# Patient Record
Sex: Male | Born: 1945
Health system: Southern US, Community
[De-identification: ages and names within clinical notes are randomized; demographics above are authoritative.]

## PROBLEM LIST (undated history)

## (undated) DIAGNOSIS — R001 Bradycardia, unspecified: Secondary | ICD-10-CM

## (undated) DIAGNOSIS — Z87442 Personal history of urinary calculi: Secondary | ICD-10-CM

## (undated) DIAGNOSIS — I82409 Acute embolism and thrombosis of unspecified deep veins of unspecified lower extremity: Secondary | ICD-10-CM

## (undated) DIAGNOSIS — E291 Testicular hypofunction: Secondary | ICD-10-CM

## (undated) DIAGNOSIS — K635 Polyp of colon: Secondary | ICD-10-CM

## (undated) DIAGNOSIS — K219 Gastro-esophageal reflux disease without esophagitis: Secondary | ICD-10-CM

## (undated) DIAGNOSIS — E785 Hyperlipidemia, unspecified: Secondary | ICD-10-CM

## (undated) DIAGNOSIS — M1990 Unspecified osteoarthritis, unspecified site: Secondary | ICD-10-CM

## (undated) DIAGNOSIS — K649 Unspecified hemorrhoids: Secondary | ICD-10-CM

## (undated) DIAGNOSIS — J189 Pneumonia, unspecified organism: Secondary | ICD-10-CM

## (undated) DIAGNOSIS — I2699 Other pulmonary embolism without acute cor pulmonale: Secondary | ICD-10-CM

## (undated) DIAGNOSIS — I10 Essential (primary) hypertension: Secondary | ICD-10-CM

## (undated) HISTORY — DX: Gastro-esophageal reflux disease without esophagitis: K21.9

## (undated) HISTORY — DX: Hyperlipidemia, unspecified: E78.5

## (undated) HISTORY — DX: Other pulmonary embolism without acute cor pulmonale: I26.99

## (undated) HISTORY — DX: Polyp of colon: K63.5

## (undated) HISTORY — DX: Testicular hypofunction: E29.1

## (undated) HISTORY — DX: Pneumonia, unspecified organism: J18.9

## (undated) HISTORY — DX: Bradycardia, unspecified: R00.1

## (undated) HISTORY — DX: Unspecified hemorrhoids: K64.9

## (undated) HISTORY — PX: HAND SURGERY: SHX662

## (undated) HISTORY — PX: APPENDECTOMY: SHX54

---

## 1998-12-10 ENCOUNTER — Emergency Department (HOSPITAL_COMMUNITY): Admission: EM | Admit: 1998-12-10 | Discharge: 1998-12-10 | Payer: Self-pay | Admitting: Emergency Medicine

## 2003-11-06 ENCOUNTER — Emergency Department (HOSPITAL_COMMUNITY): Admission: AD | Admit: 2003-11-06 | Discharge: 2003-11-06 | Payer: Self-pay | Admitting: Family Medicine

## 2003-11-10 ENCOUNTER — Inpatient Hospital Stay (HOSPITAL_COMMUNITY): Admission: EM | Admit: 2003-11-10 | Discharge: 2003-11-14 | Payer: Self-pay | Admitting: Emergency Medicine

## 2005-09-06 ENCOUNTER — Ambulatory Visit: Payer: Self-pay | Admitting: Gastroenterology

## 2005-09-17 ENCOUNTER — Encounter (INDEPENDENT_AMBULATORY_CARE_PROVIDER_SITE_OTHER): Payer: Self-pay | Admitting: Specialist

## 2005-09-17 ENCOUNTER — Ambulatory Visit: Payer: Self-pay | Admitting: Gastroenterology

## 2009-08-25 ENCOUNTER — Encounter (INDEPENDENT_AMBULATORY_CARE_PROVIDER_SITE_OTHER): Payer: Self-pay | Admitting: *Deleted

## 2010-02-16 ENCOUNTER — Encounter (INDEPENDENT_AMBULATORY_CARE_PROVIDER_SITE_OTHER): Payer: Self-pay | Admitting: *Deleted

## 2010-03-22 LAB — FECAL OCCULT BLOOD, GUAIAC: Fecal Occult Blood: NEGATIVE

## 2010-03-23 ENCOUNTER — Encounter (INDEPENDENT_AMBULATORY_CARE_PROVIDER_SITE_OTHER): Payer: Self-pay

## 2010-03-27 ENCOUNTER — Ambulatory Visit: Payer: Self-pay | Admitting: Internal Medicine

## 2010-04-10 ENCOUNTER — Ambulatory Visit: Payer: Self-pay | Admitting: Internal Medicine

## 2010-08-19 ENCOUNTER — Encounter: Payer: Self-pay | Admitting: Family Medicine

## 2010-08-28 NOTE — Miscellaneous (Signed)
Summary: Lec previsit  Clinical Lists Changes  Medications: Added new medication of MOVIPREP 100 GM  SOLR (PEG-KCL-NACL-NASULF-NA ASC-C) As per prep instructions. - Signed Rx of MOVIPREP 100 GM  SOLR (PEG-KCL-NACL-NASULF-NA ASC-C) As per prep instructions.;  #1 x 0;  Signed;  Entered by: Ulis Rias RN;  Authorized by: Iva Boop MD, [facility];  Method used: Electronically to [facility] [phone]*, [address], [place], [address], [place]  [address], Ph: [phone] or [phone], Fax: [phone] Observations: Added new observation of NKA: T (03/27/2010 13:54)    Prescriptions: MOVIPREP 100 GM  SOLR (PEG-KCL-NACL-NASULF-NA ASC-C) As per prep instructions.  #1 x 0   Entered by:   Ulis Rias RN   Authorized by:   Iva Boop MD, [facility]   Signed by:   Ulis Rias RN on 03/27/2010   Method used:   Electronically to        [facility] [phone]* (retail)       [address] [place], [place]  [address]       Ph: [phone] or [phone]       Fax: [phone]   RxID:   [id number]

## 2010-08-28 NOTE — Letter (Signed)
Summary: Colonoscopy Letter  [facility]  [address] [place], [place] [address]   Phone: [phone]  Fax: [phone]      August 25, 2009 MRN: [id number]   [name] [name] [address] [place], [place]  [address]   Dear Mr. Weigelt,   According to your medical record, it is time for you to schedule a Colonoscopy. The American Cancer Society recommends this procedure as a method to detect early colon cancer. Patients with a family history of colon cancer, or a personal history of colon polyps or inflammatory bowel disease are at increased risk.  This letter has beeen generated based on the recommendations made at the time of your procedure. If you feel that in your particular situation this may no longer apply, please contact our office.  Please call our office at [phone] to schedule this appointment or to update your records at your earliest convenience.  Thank you for cooperating with [place] to provide you with the very best care possible.   Sincerely,  Barbette Hair. Arlyce Dice, M.D.  [facility] Gastroenterology Division [phone]

## 2010-08-28 NOTE — Procedures (Signed)
Summary: Colonoscopy  Patient: [name] [name] Note: All result statuses are Final unless otherwise noted.  Tests: (1) Colonoscopy (COL)   COL Colonoscopy           DONE     [facility]     [address]     [place], [place]  [address]           COLONOSCOPY PROCEDURE REPORT           PATIENT:  [name], [name]  MR#:  [id number]     BIRTHDATE:  02-Apr-1946, 63 yrs. old  GENDER:  male     ENDOSCOPIST:  Iva Boop, MD, [facility]           PROCEDURE DATE:  04/10/2010     PROCEDURE:  Colonoscopy [address]56     ASA CLASS:  Class II     INDICATIONS:  surveillance and high-risk screening diminutive     adenomas removed 2002 and 2007 (1 each time)     MEDICATIONS:   Fentanyl 50 mcg IV, Versed 5 mg IV           DESCRIPTION OF PROCEDURE:   After the risks benefits and     alternatives of the procedure were thoroughly explained, informed     consent was obtained.  Digital rectal exam was performed and     revealed no abnormalities and normal prostate.   The LB CF-[id number]     [id number] endoscope was introduced through the anus and advanced to     the cecum, which was identified by both the appendix and ileocecal     valve, without limitations.  The quality of the prep was     excellent, using MoviPrep.  The instrument was then slowly     withdrawn as the colon was fully examined. Insertion: 2:33 minutes     Withdrawal: 7:48 minutes     <<PROCEDUREIMAGES>>           FINDINGS:  A normal appearing cecum, ileocecal valve, and     appendiceal orifice were identified. The ascending, hepatic     flexure, transverse, splenic flexure, descending, sigmoid colon,     and rectum appeared unremarkable.   Retroflexed views in the right     colon and  rectum revealed hypertrophied anal papillae.    The     scope was then withdrawn from the patient and the procedure     completed.           COMPLICATIONS:  None     ENDOSCOPIC IMPRESSION:     1) Normal colon with excellent prep     2) Hypertrophied anal  papillae in the anorectum     3) Personal history of diminutive adenoma removal (1 each) in 2002     and 2007           REPEAT EXAM:  In 5 year(s) for routine screening colonoscopy.           Iva Boop, MD, [facility]           CC:  Rudi Heap, MD     The Patient           n.     Rosalie Doctor:   Iva Boop at 04/10/2010 10:59 AM           Romilda Joy, [phone]  Note: An exclamation mark (!) indicates a result that was not dispersed into the flowsheet. Document Creation Date: 04/10/2010 11:00 AM _______________________________________________________________________  (1) Order result  status: Final Collection or observation date-time: 04/10/2010 10:47 Requested date-time:  Receipt date-time:  Reported date-time:  Referring Physician:   Ordering Physician: Stan Head [phone]) Specimen Source:  Source: Launa Grill Order Number: [id number] Lab site:   Appended Document: Colonoscopy    Clinical Lists Changes  Observations: Added new observation of COLONNXTDUE: 03/2015 (04/10/2010 14:25)

## 2010-08-28 NOTE — Letter (Signed)
Summary: Previsit letter  [facility]  [address] [place], [place] [address]   Phone: [phone]  Fax: [phone]       02/16/2010 MRN: [id number]  [name] [name] [address] [place], [place]  [address]  Dear [name],  Welcome to the Gastroenterology Division at [facility].    You are scheduled to see a nurse for your pre-procedure visit on 03-27-10 at 2:00p.m. on the 3rd floor at [facility], [address]  We ask that you try to arrive at our office 15 minutes prior to your appointment time to allow for check-in.  Your nurse visit will consist of discussing your medical and surgical history, your immediate family medical history, and your medications.    Please bring a complete list of all your medications or, if you prefer, bring the medication bottles and we will list them.  We will need to be aware of both prescribed and over the counter drugs.  We will need to know exact dosage information as well.  If you are on blood thinners (Coumadin, Plavix, Aggrenox, Ticlid, etc.) please call our office today/prior to your appointment, as we need to consult with your physician about holding your medication.   Please be prepared to read and sign documents such as consent forms, a financial agreement, and acknowledgement forms.  If necessary, and with your consent, a friend or relative is welcome to sit-in on the nurse visit with you.  Please bring your insurance card so that we may make a copy of it.  If your insurance requires a referral to see a specialist, please bring your referral form from your primary care physician.  No co-pay is required for this nurse visit.     If you cannot keep your appointment, please call [phone] to cancel or reschedule prior to your appointment date.  This allows [place] the opportunity to schedule an appointment for another patient in need of care.    Thank you for choosing [facility] for your medical needs.   We appreciate the opportunity to care for you.  Please visit [place] at our website  to learn more about our practice.                     Sincerely.                                                                                                                   The Gastroenterology Division

## 2010-08-28 NOTE — Letter (Signed)
Summary: [facility] Instructions  [facility]  [address] [place], [place] [address]   Phone: [phone]  Fax: [phone]       INMER NIX    Sep 30, 1945    MRN: [id number]        Procedure Day /Date:  Tuesday 04/10/2010     Arrival Time: 9:00 am      Procedure Time: 10:00 am     Location of Procedure:                    _x _  [facility] (4th Floor)                        PREPARATION FOR COLONOSCOPY WITH MOVIPREP   Starting 5 days prior to your procedure Thursday 9/8 do not eat nuts, seeds, popcorn, corn, beans, peas,  salads, or any raw vegetables.  Do not take any fiber supplements (e.g. Metamucil, Citrucel, and Benefiber).  THE DAY BEFORE YOUR PROCEDURE         DATE: Monday 9/12 1.  Drink clear liquids the entire day-NO SOLID FOOD  2.  Do not drink anything colored red or purple.  Avoid juices with pulp.  No orange juice.  3.  Drink at least 64 oz. (8 glasses) of fluid/clear liquids during the day to prevent dehydration and help the prep work efficiently.  CLEAR LIQUIDS INCLUDE: Water Jello Ice Popsicles Tea (sugar ok, no milk/cream) Powdered fruit flavored drinks Coffee (sugar ok, no milk/cream) Gatorade Juice: apple, white grape, white cranberry  Lemonade Clear bullion, consomm, broth Carbonated beverages (any kind) Strained chicken noodle soup Hard Candy                             4.  In the morning, mix first dose of MoviPrep solution:    Empty 1 Pouch A and 1 Pouch B into the disposable container    Add lukewarm drinking water to the top line of the container. Mix to dissolve    Refrigerate (mixed solution should be used within 24 hrs)  5.  Begin drinking the prep at 5:00 p.m. The MoviPrep container is divided by 4 marks.   Every 15 minutes drink the solution down to the next mark (approximately 8 oz) until the full liter is complete.   6.  Follow completed prep with 16 oz of clear liquid of your choice (Nothing  red or purple).  Continue to drink clear liquids until bedtime.  7.  Before going to bed, mix second dose of MoviPrep solution:    Empty 1 Pouch A and 1 Pouch B into the disposable container    Add lukewarm drinking water to the top line of the container. Mix to dissolve    Refrigerate  THE DAY OF YOUR PROCEDURE      DATE: Tuesday 9/13  Beginning at 5:00 a.m. (5 hours before procedure):         1. Every 15 minutes, drink the solution down to the next mark (approx 8 oz) until the full liter is complete.  2. Follow completed prep with 16 oz. of clear liquid of your choice.    3. You may drink clear liquids until 8:00 am (2 HOURS BEFORE PROCEDURE).   MEDICATION INSTRUCTIONS  Unless otherwise instructed, you should take regular prescription medications with a small sip of water   as early as possible the morning of your  procedure.          OTHER INSTRUCTIONS  You will need a responsible adult at least 67 years of age to accompany you and drive you home.   This person must remain in the waiting room during your procedure.  Wear loose fitting clothing that is easily removed.  Leave jewelry and other valuables at home.  However, you may wish to bring a book to read or  an iPod/MP3 player to listen to music as you wait for your procedure to start.  Remove all body piercing jewelry and leave at home.  Total time from sign-in until discharge is approximately 2-3 hours.  You should go home directly after your procedure and rest.  You can resume normal activities the  day after your procedure.  The day of your procedure you should not:   Drive   Make legal decisions   Operate machinery   Drink alcohol   Return to work  You will receive specific instructions about eating, activities and medications before you leave.    The above instructions have been reviewed and explained to me by   Ulis Rias RN  March 27, 2010 2:27 PM     I fully understand and can  verbalize these instructions _____________________________ Date _________

## 2010-11-16 ENCOUNTER — Encounter: Payer: Self-pay | Admitting: Family Medicine

## 2010-11-16 DIAGNOSIS — I10 Essential (primary) hypertension: Secondary | ICD-10-CM | POA: Insufficient documentation

## 2010-11-16 DIAGNOSIS — R001 Bradycardia, unspecified: Secondary | ICD-10-CM | POA: Insufficient documentation

## 2010-11-16 DIAGNOSIS — K635 Polyp of colon: Secondary | ICD-10-CM

## 2010-11-16 DIAGNOSIS — N400 Enlarged prostate without lower urinary tract symptoms: Secondary | ICD-10-CM | POA: Insufficient documentation

## 2010-11-16 DIAGNOSIS — E785 Hyperlipidemia, unspecified: Secondary | ICD-10-CM

## 2010-11-16 DIAGNOSIS — K649 Unspecified hemorrhoids: Secondary | ICD-10-CM | POA: Insufficient documentation

## 2010-11-16 DIAGNOSIS — E349 Endocrine disorder, unspecified: Secondary | ICD-10-CM | POA: Insufficient documentation

## 2010-11-16 DIAGNOSIS — E291 Testicular hypofunction: Secondary | ICD-10-CM

## 2010-11-16 DIAGNOSIS — E1159 Type 2 diabetes mellitus with other circulatory complications: Secondary | ICD-10-CM | POA: Insufficient documentation

## 2010-11-16 DIAGNOSIS — K219 Gastro-esophageal reflux disease without esophagitis: Secondary | ICD-10-CM | POA: Insufficient documentation

## 2010-12-14 NOTE — Discharge Summary (Signed)
[name], [name]                         ACCOUNT NO.:  [id number]   MEDICAL RECORD NO.:  [id number]                   PATIENT TYPE:  INP   LOCATION:  5705                                 FACILITY:  [facility]   PHYSICIAN:  Dionne Ano. Amanda Pea, M.D.             DATE OF BIRTH:  April 16, 1946   DATE OF ADMISSION:  11/10/2003  DATE OF DISCHARGE:  11/14/2003                                 DISCHARGE SUMMARY   ADMISSION DIAGNOSES:  1. Right hand, mid palmar, abscess with ascending lymphangitis.  2. History of hypertension.  3. History of asthma.   DISCHARGE DIAGNOSES:  1. Right hand, mid palmar, abscess with ascending lymphangitis, improved.  2. History of hypertension.  3. History of asthma.   SURGEON:  Dionne Ano. Amanda Pea, M.D.   PROCEDURE:  1. Incision and drainage of the mid palmar space with fasciotomy of the     right hand, right open carpal tunnel release and tenosynovectomy     ____________ with sheath incision and drainage.  2. Repeat incision and drainage of the mid palmar space with I&D reflects     her sheath at wrist, palm and forearm as well as closure.  Consults:     None.   BRIEF HISTORY:  [name] [name] is a very pleasant 65 year old gentleman who  presented to the [facility] Emergency Room on November 10, 2003 for progressive  swelling and pain and tenderness, status post an injury he sustained while  working in a chicken coop from a dirty stick.  Unfortunately, the patient  had a stabbing injury through the mid palm space, approximately four days to  his ER visit.  He was seen in cholinergic care and placed on pain  medications and washed out.  He was started on antibiotics on Monday and  was seen subsequently by ___________ and Dr. Jacqulyn Bath in [place] and referred to  [facility] for evaluation due to increased pain, tenderness and swelling.  Hand surgeon, Dr. Dominica Severin, saw and evaluated the patient.  He was  found to have a mid palmar space infection with ascending  lymphangitis and  cellulitis.  Due to the severity of upper extremity predicament, decision  was made to proceed to the OR for incision and drainage fasciotomy and  carpal tunnel release as necessary.  Preoperatively, the patient's  laboratory data showed his chest x-ray showed mild enlargement of the  cardiac silhouette; otherwise within normal limits.  He had normal sinus  rhythm on his EKG.  Hemoglobin and hematocrit within normal limits.   HOSPITAL COURSE:  [name] [name] was admitted on November 10, 2003 and underwent  the above procedures without complications.  He tolerated this quite well  with expectations of a repeat incision and drainage within 24-48 hours.  On  November 10, 2003, postoperative day #1, he was doing well.  He was started on  Unasyn prophylactic antibiotic regimen as well as standard pain medication.  He  was doing quite well.  He, of course, had pain and tenderness but he was  afebrile.  His vital signs were stable.  His vascular exam was within normal  limits and showed no signs of compartment syndrome, status post his  fasciotomy and open carpal tunnel release.  Sensation was intact of all the  fingers.  Elevation and IV antibiotics would be continued throughout the  following day and postoperative day #2, repeat incision and drainage to the  mid palmar space of the right hand was performed, but not in the flexor  sheath of the wrist, palm and forearm as well as closure.  He tolerated the  second procedure well.  There were no complications.  Please see operative  report for details.  Throughout the next few days, his continually improved.  Intraoperative cultures were obtained and showed rare E coli and  subsequently, after his discharge, some final cultures showed rare  Enterobacter cloacae.  No anaerobes were present.  Wound care was continued  throughout his hospital course as well as IV antibiotics, and on November 14, 2003, he was doing quite well.  He was without  complaints.  His vital signs  were stable.  He was afebrile.  His wound was much improved.  His range of  motion had continually improved with therapy's help.  There was less  swelling and overall he progressed nicely.  Due to his improved state,  decision was made to discharge him home.   FINAL DIAGNOSIS:  Mid palmar space infection/deep abscess and ascending  lymphangitis, status post incision and drainage x2 with fasciotomy and  carpal tunnel release of the right upper extremity.   CONDITION ON DISCHARGE:  Improved.   DIET:  Regular diet.   ACTIVITY:  He will keep his dressing clean, dry and intact.  He will move  his fingers frequently and elevate them as well as massage.   DISCHARGE MEDICATIONS:  1. Cipro 750 mg one p.o. b.i.d.  2. Percocet #15 one to two p.o. q.4-6h. p.r.n. pain.  3. Robaxin #30 one p.o. q.6-8h. p.r.n. spasm.   WOUND CARE:  He will keep his upper extremities clean, dry and intact.   FOLLOW UP:  Return to see Dr. Amanda Pea in the office setting Thursday.  He  will see the therapist tomorrow for wound care as explicitly detailed to  them.      Karie Chimera, P.A.-C.                   Dionne Ano. Amanda Pea, M.D.    BB/MEDQ  D:  01/09/2004  T:  01/10/2004  Job:  [id number]

## 2010-12-14 NOTE — Op Note (Signed)
[name] [name], [name] [name]                         ACCOUNT NO.:  [id number]   MEDICAL RECORD NO.:  [id number]                   PATIENT TYPE:  INP   LOCATION:  5705                                 FACILITY:  [facility]   PHYSICIAN:  Dionne Ano. Everlene Other, M.D.         DATE OF BIRTH:  10-31-1945   DATE OF PROCEDURE:  11/12/2003  DATE OF DISCHARGE:                                 OPERATIVE REPORT   PREOPERATIVE DIAGNOSES:  Status post incision and drainage of right  midpalmar space, flexor tendon sheath apparatus and open carpal tunnel  release secondary to severe infection, right hand.   POSTOPERATIVE DIAGNOSES:  Status post incision and drainage of right  midpalmar space, flexor tendon sheath apparatus and open carpal tunnel  release secondary to severe infection, right hand.   OPERATION PERFORMED:  1. Incision and drainage, midpalmar space with loose closure.  2. Incision and drainage, carpal canal and wrist forearm flexor sheath     system with loose closure over drain.  3. Incision and drainage, flexor sheath system in the mid palm region.   SURGEON:  Dionne Ano. Amanda Pea, M.D.   ASSISTANT:  None.   ANESTHESIA:  General.   COMPLICATIONS:  None.   DRAINS:  Three.   TOURNIQUET TIME:  0.   ESTIMATED BLOOD LOSS:  Minimal.   INDICATIONS FOR PROCEDURE:  The patient is a 65 year old male who has  previously undergone incision and drainage.  He is here for second look and  washout.  He has responded reasonably well but still continues to have  swelling and signs of infection, of course.  There is no ascending  cellulitis or lymphangitis.  His forearm has been soft proximally and  without tenderness along the fascial planes.  His elbow motion has been  maintained without abnormality.  His cultures currently pending.  He has  been on antibiotics prior to his incision and drainage.  Thus this may be  skewed a bit.  He understands the risks and benefits of surgery.   DESCRIPTION OF  PROCEDURE:  The patient was seen by myself and anesthesia,  was taken to the operative suite, and underwent a smooth induction of  general anesthesia.  He was laid supine, appropriately padded, prepped and  draped in the usual sterile fashion about the right upper extremity with  Betadine scrub and paint.  Once this was accomplished, the patient had the  wounds reopened.  I performed cultures, aerobic and anaerobic at this time.  Following this, I then performed incision and drainage of the mid palmar  space including the deep midpalmar space below the flexor sheath and tendon  region.  This was irrigated copiously with warm saline.  Following this, the  flexor tendon sheath of the middle, ring and small fingers were separately  identified and irrigated.  I took care to preserve the neurovascular bundles  to my satisfaction.  Following this, I turned attention towards the carpal  tunnel and forearm wound.  I performed incision and drainage and irrigation  of the area.  There was no significant reaccumulation of purulence and I was  pleased with this.  I then performed further irrigation with greater than 2  L of fluid.  Following this, a Penrose drain was placed times two about the  midpalmar space and times one about the wrist region.  I then closed both  areas loosely with Prolene suture and left no nerves or tendons exposed.  The patient tolerated the procedure well without difficulty.  There were no  complicating features with this surgery.  Following this, he was extubated  after sterile dressing and volar splint were applied, and taken to recovery  room.  Mission sling, IV antibiotics, OT consult for range of motion and  general postoperative measures will be adhered to.  I have discussed with  him do's and don't's etc and all questions have been encouraged and  answered.                                               Dionne Ano. Everlene Other, M.D.    Nash Mantis  D:  11/12/2003  T:   11/14/2003  Job:  [id number]

## 2010-12-14 NOTE — Op Note (Signed)
[name] [name], [name] [name]                         ACCOUNT NO.:  [id number]   MEDICAL RECORD NO.:  [id number]                   PATIENT TYPE:  INP   LOCATION:  5705                                 FACILITY:  [facility]   PHYSICIAN:  Dionne Ano. Everlene Other, M.D.         DATE OF BIRTH:  07-09-1946   DATE OF PROCEDURE:  11/10/2003  DATE OF DISCHARGE:                                 OPERATIVE REPORT   PREOPERATIVE DIAGNOSIS:  Right hand purulent flexor tenosynovitis and  associated midpalmar space infection with tracking of the infection into the  carpal tunnel region along the bursal sheath system.   POSTOPERATIVE DIAGNOSIS:  Right hand purulent flexor tenosynovitis and  associated midpalmar space infection with tracking of the infection into the  carpal tunnel region along the bursal sheath system.   OPERATION PERFORMED:  1. Incision and drainage midpalmar space, right hand.  2. Fasciotomy of right hand and forearm.  3. Open carpal tunnel release.  4. Tenosynovectomy flexor digitorum superficialis and flexor digitorum     profundus at the palm and wrist forearm level.   SPECIMENS:  Cultures times three taken.   DRAINS:  Multiple placed.   SURGEON:  Dionne Ano. Amanda Pea, M.D.   ASSISTANT:  None.   ANESTHESIA:  General.   COMPLICATIONS:  None.   TOURNIQUET TIME:  Less than an hour.   ESTIMATED BLOOD LOSS:  Minimal.   INDICATIONS FOR PROCEDURE:  The patient is a 65 year old male who sustained  a puncture wound with a small stick in his midpalm, right hand four days  ago.  Unfortunately, the stick had remnants of chicken feces on it.  He was  seen and initially incised and drained the wound and closed.  This initial  presentation was at the [facility] urgent care.  He was subsequently seen by Dr.  Bevelyn Buckles and Dr. Jacqulyn Bath  in [place] and referred to [facility]  after worsening.  He was seen today by my partner, Dr. Leonides Grills, who  referred him to my for upper extremity specialty  care.  He presented to the  emergency department where I saw and evaluated him at length.  The patient  had findings as noted above and I felt it was imperative to bring him to the  operating room as soon as possible as this infection was worsening quickly.  The patient understood risks and benefits of loss of limb, bleeding,  infection, anesthesia, damage to normal structures and failure of surgery to  accomplish its intended goals of relieving symptoms and restoring function.  With this in mind, he desires to proceed.  I have discussed all issues  preoperatively with him and his wife.   DESCRIPTION OF PROCEDURE:  The patient was seen by myself and anesthesia, he  was taken to the operative suite and underwent a smooth induction of general  anesthesia.  He was then prepped and draped in the usual sterile fashion  about the right  upper extremity after he was padded appropriately.  Once  this was done, a tourniquet was insufflated to 250 mmHg and incision was  made about the proximal palmar crease, oblique in nature.  Dissection was  carried down through the palmar fascia which was incised, allowing  identification of the superficial palmar arch and the common and digital  arteries and nerves and the proper digital arteries and nerves.  The patient  had fluid egress from the flexor sheaths of the small, ring and middle  fingers.  I cultured this immediately and sent this off.  Following this, I  performed a debridement of the FDS and FDP tendons about the middle, ring  and small fingers.  Following this, I entered into the midpalmar space where  purulent fluid was encountered as well.  A fasciotomy was accomplished about  the interosseous muscles in this region to my satisfaction without  difficulty and a second culture was taken in this region of the purulent  fluid.  The midpalmar space was thoroughly incised and drained to my  satisfaction without difficulty.  This was done separately  about each  interosseous region taking care to preserve the digital artery and nerves as  well as the flexor tendons.  I carefully placed retractors between the  spaces to make sure he was completely released. There was no obvious distal  tracking; however, the patient did have fluid tracking proximally into the  carpal canal. This was a known phenomenon where the sheath system will track  infection into the forearm via the carpal canal and sheath system.   With this noted, I then made an extended carpal tunnel incision into the  forearm, dissected down and released the transverse carpal ligament after  the palmar fascia was released.  This released the median nerve which was  under tight pressure.  Fluid egressed which was similar in nature to the  fluid that I had previously cultured.  I then performed a fasciotomy about  the volar forearm.  I identified the muscle and made sure this was not  necrotic.  This was noted to be fresh, healthy muscle.  There was no  necrosis of the muscle.  I then carefully performed a tenosynovectomy of the  FDP and FDS tendons removing fluid from the region.  This was done to my  satisfaction without difficulty.  Following this, I deflated the tourniquet,  irrigated copiously with greater than 2 L of fluid and once this was done,  placed five Penrose drains in the wound to allow for egress of fluid.  The  patient tolerated the procedure well.  Following this, sterile dressing was  applied.  Excellent refill was noted.  Compartments were soft and there were  no complicating features.  A sterile dressing of Xeroform was placed over  the wounds which were left open except for one Prolene stitch in the  extended carpal tunnel release site. This will allow for the egress of  fluid.  Sterile dressing was applied and a volar plaster splint placed.  Following this, he was extubated and taken to recovery room in stable condition where excellent refill was noted and  he was in stable condition.  The patient will be started immediately on IV Unasyn.  He will be monitored  closely.  We will plan for elevation, antibiotics, close observation and  second look in 24 to 36 hours to check his progress.  We will of course  await cultures as well.  All questions have been  encouraged and answered.                                               Dionne Ano. Everlene Other, M.D.    Nash Mantis  D:  11/10/2003  T:  11/11/2003  Job:  [id number]

## 2012-12-02 ENCOUNTER — Other Ambulatory Visit: Payer: Self-pay | Admitting: Family Medicine

## 2012-12-03 NOTE — Telephone Encounter (Signed)
LAST LABS 1/14

## 2012-12-14 ENCOUNTER — Ambulatory Visit (INDEPENDENT_AMBULATORY_CARE_PROVIDER_SITE_OTHER): Payer: No Typology Code available for payment source | Admitting: Family Medicine

## 2012-12-14 ENCOUNTER — Encounter: Payer: Self-pay | Admitting: Family Medicine

## 2012-12-14 VITALS — BP 129/79 | HR 53 | Temp 97.0°F | Ht 65.25 in | Wt 252.8 lb

## 2012-12-14 DIAGNOSIS — M25569 Pain in unspecified knee: Secondary | ICD-10-CM

## 2012-12-14 DIAGNOSIS — E785 Hyperlipidemia, unspecified: Secondary | ICD-10-CM

## 2012-12-14 DIAGNOSIS — E291 Testicular hypofunction: Secondary | ICD-10-CM

## 2012-12-14 DIAGNOSIS — R5381 Other malaise: Secondary | ICD-10-CM

## 2012-12-14 DIAGNOSIS — R5383 Other fatigue: Secondary | ICD-10-CM

## 2012-12-14 DIAGNOSIS — E559 Vitamin D deficiency, unspecified: Secondary | ICD-10-CM

## 2012-12-14 DIAGNOSIS — I10 Essential (primary) hypertension: Secondary | ICD-10-CM | POA: Insufficient documentation

## 2012-12-14 DIAGNOSIS — K219 Gastro-esophageal reflux disease without esophagitis: Secondary | ICD-10-CM

## 2012-12-14 DIAGNOSIS — J309 Allergic rhinitis, unspecified: Secondary | ICD-10-CM

## 2012-12-14 DIAGNOSIS — M25559 Pain in unspecified hip: Secondary | ICD-10-CM

## 2012-12-14 DIAGNOSIS — N400 Enlarged prostate without lower urinary tract symptoms: Secondary | ICD-10-CM

## 2012-12-14 LAB — HEPATIC FUNCTION PANEL: ALT: 19 U/L (ref 0–53)

## 2012-12-14 LAB — POCT CBC
Lymph, poc: 2 (ref 0.6–3.4)
MCH, POC: 30.8 pg (ref 27–31.2)
MCHC: 33.1 g/dL (ref 31.8–35.4)
MCV: 93.1 fL (ref 80–97)
POC Granulocyte: 3.8 (ref 2–6.9)
POC LYMPH PERCENT: 33.9 %L (ref 10–50)
RBC: 5.1 M/uL (ref 4.69–6.13)

## 2012-12-14 LAB — BASIC METABOLIC PANEL WITH GFR
CO2: 30 mEq/L (ref 19–32)
Chloride: 101 mEq/L (ref 96–112)
Creat: 0.89 mg/dL (ref 0.50–1.35)
GFR, Est Non African American: 88 mL/min

## 2012-12-14 LAB — PSA: PSA: 2.31 ng/mL (ref <=4.00)

## 2012-12-14 NOTE — Patient Instructions (Addendum)
Fall precautions discussed Continue current meds and therapeutic lifestyle changes, lose weight!!!!!!!! Try Advil one 3 times daily after meals or Aleve 1 twice daily after meals, taking this be sure and take the omeprazole twice daily before meals Please check on the availability of the shingles shot, zostavax. Try Nasacort AQ over-the-counter 1-2 sprays each nostril daily. Use this regularly and it should help your nasal congestion.

## 2012-12-14 NOTE — Progress Notes (Signed)
Subjective:    Patient ID: [name] [name], male    DOB: 05/05/1946, 67 y.o.   MRN: [id number]  HPI This patient presents for recheck of multiple medical problems. His wife accompanies the patient today.  Patient Active Problem List   Diagnosis Date Noted  . Testosterone deficiency   . Hyperlipidemia   . Sinus bradycardia   . GERD (gastroesophageal reflux disease)   . Colon polyp history   . Hemorrhoid   . BPH     In addition, see review of systems. All blood pressures run anywhere from 108-140 over the 70s. Energy level is about the same no worse no better. Sexual function is stable.   The allergies, current medications, past medical history, surgical history, family and social history are reviewed.  Immunizations reviewed.  Health maintenance reviewed.  The following items are outstanding: FOBT and zostavax      Review of Systems  Constitutional: Positive for fatigue (slight).  HENT: Negative.   Eyes: Negative.   Respiratory: Negative.   Cardiovascular: Negative.   Gastrointestinal: Positive for constipation.  Endocrine: Negative.   Genitourinary: Negative.   Musculoskeletal: Positive for back pain (LBP), joint swelling (knees) and arthralgias (bilateral arms, knees).  Skin: Negative.   Allergic/Immunologic: Positive for environmental allergies (seasonal).  Neurological: Negative.   Psychiatric/Behavioral: Negative.        Objective:   Physical Exam BP 129/79  Pulse 53  Temp(Src) 97 F (36.1 C) (Oral)  Ht 5' 5.25" (1.657 m)  Wt 252 lb 12.8 oz (114.669 kg)  BMI 41.76 kg/m2  The patient appeared well nourished and normally developed, alert and oriented to time and place. Speech, behavior and judgement appear normal. Vital signs as documented.  Head exam is unremarkable. No scleral icterus or pallor noted. He does have bilateral nasal congestion. Throat was normal . Neck is without jugular venous distension, thyromegally, or carotid bruits. Carotid  upstrokes are brisk bilaterally. No cervical adenopathy. Lungs are clear anteriorly and posteriorly to auscultation. Normal respiratory effort. Cardiac exam reveals regular rate and rhythm at 60 per minute. First and second heart sounds normal.  No murmurs, rubs or gallops.  Abdominal exam reveals normal bowl sounds, no masses, no organomegaly and no aortic enlargement. No inguinal adenopathy. There was abdominal obesity. There is no inguinal hernia. The prostate was minimally enlarged. There were no lumps in the prostate or in the rectum. Extremities are nonedematous and both femoral and pedal pulses are normal. Skin without pallor or jaundice.  Warm and dry, without rash. He has thick nail tinea on his toes. Neurologic exam reveals normal deep tendon reflexes and normal sensation.          Assessment & Plan:  1. GERD (gastroesophageal reflux disease) - Hepatic function panel; Standing - Hepatic function panel  2. Hyperlipidemia - Hepatic function panel; Standing - BASIC METABOLIC PANEL WITH GFR; Standing - NMR Lipoprofile with Lipids; Standing - Hepatic function panel - BASIC METABOLIC PANEL WITH GFR - NMR Lipoprofile with Lipids  3. Testosterone deficiency - Testosterone, Total & Free Direct - PSA  4. Vitamin D deficiency - Vitamin D 25 hydroxy; Standing - Vitamin D 25 hydroxy  5. Fatigue - POCT CBC; Standing - POCT CBC  6. BPH (benign prostatic hyperplasia) - PSA  7. Chronic arthralgias of knees and hips, unspecified laterality  8. Allergic rhinitis We'll try Nasacort AQ over-the-counter   Patient Instructions  Fall precautions discussed Continue current meds and therapeutic lifestyle changes, lose weight!!!!!!!! Try Advil one 3 times  daily after meals or Aleve 1 twice daily after meals, taking this be sure and take the omeprazole twice daily before meals Please check on the availability of the shingles shot, zostavax. Try Nasacort AQ over-the-counter 1-2  sprays each nostril daily. Use this regularly and it should help your nasal congestion.

## 2012-12-15 LAB — VITAMIN D 25 HYDROXY (VIT D DEFICIENCY, FRACTURES): Vit D, 25-Hydroxy: 62 ng/mL (ref 30–89)

## 2012-12-16 LAB — TESTOSTERONE, TOTAL AND FREE DIRECT MEASURE: Testosterone: 250 ng/dL — ABNORMAL LOW (ref 300–890)

## 2012-12-18 LAB — NMR LIPOPROFILE WITH LIPIDS
Cholesterol, Total: 113 mg/dL (ref <200)
HDL Particle Number: 31.5 umol/L (ref >=30.5)
HDL Size: 8.7 nm — ABNORMAL LOW (ref >=9.2)
HDL-C: 35 mg/dL — ABNORMAL LOW (ref >=40)
LDL Size: 20 nm — ABNORMAL LOW (ref >20.5)
LP-IR Score: 54 — ABNORMAL HIGH (ref <=45)
Large HDL-P: 2.4 umol/L — ABNORMAL LOW (ref >=4.8)
Small LDL Particle Number: 571 nmol/L — ABNORMAL HIGH (ref <=527)
Triglycerides: 110 mg/dL (ref <150)

## 2012-12-22 ENCOUNTER — Telehealth: Payer: Self-pay | Admitting: *Deleted

## 2012-12-22 NOTE — Telephone Encounter (Signed)
Pt notified of results

## 2012-12-22 NOTE — Telephone Encounter (Signed)
Message copied by Bearl Mulberry on Tue Dec 22, 2012 11:36 AM ------      Message from: Ernestina Penna      Created: Fri Dec 18, 2012  2:03 PM       Report to patient that PSA is stable and that where it was in the past. ------

## 2013-02-01 ENCOUNTER — Other Ambulatory Visit: Payer: Self-pay | Admitting: Family Medicine

## 2013-02-22 ENCOUNTER — Other Ambulatory Visit: Payer: Self-pay

## 2013-02-22 MED ORDER — TESTOSTERONE 50 MG/5GM (1%) TD GEL: 5.0000 g | Freq: Every day | TRANSDERMAL | Status: DC

## 2013-02-22 NOTE — Telephone Encounter (Signed)
Last seen 12/14/12  DWM   If approved phone in

## 2013-02-22 NOTE — Telephone Encounter (Signed)
Left message on voicemail.

## 2013-03-16 ENCOUNTER — Other Ambulatory Visit: Payer: Self-pay

## 2013-03-16 MED ORDER — TESTOSTERONE 50 MG/5GM (1%) TD GEL: 5.0000 g | Freq: Every day | TRANSDERMAL | Status: DC

## 2013-03-16 NOTE — Telephone Encounter (Signed)
Last seen 12/14/12  DWM   Last filled 02/22/13  If approved route to  Nurse and print

## 2013-05-03 ENCOUNTER — Telehealth: Payer: Self-pay | Admitting: Family Medicine

## 2013-05-03 ENCOUNTER — Ambulatory Visit (INDEPENDENT_AMBULATORY_CARE_PROVIDER_SITE_OTHER): Payer: No Typology Code available for payment source | Admitting: Family Medicine

## 2013-05-03 ENCOUNTER — Ambulatory Visit (INDEPENDENT_AMBULATORY_CARE_PROVIDER_SITE_OTHER): Payer: No Typology Code available for payment source

## 2013-05-03 ENCOUNTER — Encounter: Payer: Self-pay | Admitting: Family Medicine

## 2013-05-03 VITALS — BP 132/77 | HR 52 | Temp 98.3°F | Ht 65.25 in | Wt 248.0 lb

## 2013-05-03 DIAGNOSIS — Z23 Encounter for immunization: Secondary | ICD-10-CM

## 2013-05-03 DIAGNOSIS — I10 Essential (primary) hypertension: Secondary | ICD-10-CM

## 2013-05-03 DIAGNOSIS — E291 Testicular hypofunction: Secondary | ICD-10-CM

## 2013-05-03 DIAGNOSIS — E785 Hyperlipidemia, unspecified: Secondary | ICD-10-CM

## 2013-05-03 DIAGNOSIS — J309 Allergic rhinitis, unspecified: Secondary | ICD-10-CM

## 2013-05-03 DIAGNOSIS — N400 Enlarged prostate without lower urinary tract symptoms: Secondary | ICD-10-CM

## 2013-05-03 DIAGNOSIS — E559 Vitamin D deficiency, unspecified: Secondary | ICD-10-CM

## 2013-05-03 DIAGNOSIS — K219 Gastro-esophageal reflux disease without esophagitis: Secondary | ICD-10-CM

## 2013-05-03 LAB — POCT CBC
Granulocyte percent: 69.8 % (ref 37–80)
HCT, POC: 46 % (ref 43.5–53.7)
Hemoglobin: 15.6 g/dL (ref 14.1–18.1)
Lymph, poc: 2.1 (ref 0.6–3.4)
MCH, POC: 31.2 pg (ref 27–31.2)
MCHC: 33.9 g/dL (ref 31.8–35.4)
MCV: 92 fL (ref 80–97)
MPV: 7.5 fL (ref 0–99.8)
POC Granulocyte: 5.6 (ref 2–6.9)
POC LYMPH PERCENT: 26 % (ref 10–50)
Platelet Count, POC: 181 K/uL (ref 142–424)
RBC: 5 M/uL (ref 4.69–6.13)
RDW, POC: 12.2 %
WBC: 8 K/uL (ref 4.6–10.2)

## 2013-05-03 NOTE — Progress Notes (Signed)
Subjective:    Patient ID: [name] [name], male    DOB: March 17, 1946, 67 y.o.   MRN: [id number]  HPI Pt here for follow up and management of chronic medical problems. Patient comes to the visit with his wife. He is doing well other than bilateral knee pain. His review of systems was otherwise negative. He does complain of some constipation which is unchanged from the past. He does not want to get the flu shot. The importance of getting the shot was stressed to him     Patient Active Problem List   Diagnosis Date Noted  . Hypertension 12/14/2012  . Vitamin D deficiency 12/14/2012  . Testosterone deficiency   . Hyperlipidemia   . Sinus bradycardia   . GERD (gastroesophageal reflux disease)   . Colon polyp history   . BPH (benign prostatic hyperplasia)    Outpatient Encounter Prescriptions as of 05/03/2013  Medication Sig Dispense Refill  . amLODipine-benazepril (LOTREL) 5-10 MG per capsule TAKE ONE CAPSULE BY MOUTH AT BEDTIME  30 capsule  3  . aspirin 81 MG tablet Take 81 mg by mouth daily.        . Cholecalciferol (VITAMIN D-3) 5000 UNITS TABS Take 1 tablet by mouth daily.        . CRESTOR 10 MG tablet TAKE ONE TABLET BY MOUTH ONE TIME DAILY  30 tablet  3  . Garlic 100 MG TABS Take 2 tablets by mouth daily.       . NON FORMULARY Take 1 tablet by mouth daily. Equate allergy tablet      . Omega-3 Fatty Acids (FISH OIL) 1000 MG CAPS Take 4 capsules by mouth daily.        Marland Kitchen omeprazole (PRILOSEC) 20 MG capsule Take 20 mg by mouth daily.      . vitamin C (ASCORBIC ACID) 500 MG tablet Take 500 mg by mouth daily.      . vardenafil (LEVITRA) 20 MG tablet Take 20 mg by mouth daily as needed.        . [DISCONTINUED] testosterone (TESTIM) 50 MG/5GM GEL Place 5 g onto the skin daily.  5 g  2   No facility-administered encounter medications on file as of 05/03/2013.    Review of Systems  Constitutional: Negative.   HENT: Negative.   Eyes: Negative.   Respiratory: Negative.   Cardiovascular:  Negative.   Gastrointestinal: Negative.   Endocrine: Negative.   Genitourinary: Negative.   Musculoskeletal: Positive for arthralgias (bilateral knee pain).  Skin: Negative.   Allergic/Immunologic: Negative.   Neurological: Negative.   Hematological: Negative.   Psychiatric/Behavioral: Negative.        Objective:   Physical Exam  Nursing note and vitals reviewed. Constitutional: He is oriented to person, place, and time. He appears well-developed and well-nourished. No distress.  HENT:  Head: Normocephalic and atraumatic.  Right Ear: External ear normal.  Left Ear: External ear normal.  Mouth/Throat: Oropharynx is clear and moist. No oropharyngeal exudate.  Nasal congestion bilaterally. Patient was edentulous. Mouth was normal  Eyes: Conjunctivae and EOM are normal. Pupils are equal, round, and reactive to light. Right eye exhibits no discharge. Left eye exhibits no discharge. No scleral icterus.  Neck: Normal range of motion. Neck supple. No thyromegaly present.  Cardiovascular: Normal rate, regular rhythm, normal heart sounds and intact distal pulses.  Exam reveals no gallop and no friction rub.   No murmur heard. At 60 per minute  Pulmonary/Chest: Effort normal and breath sounds normal. No respiratory distress.  He has no wheezes. He has no rales. He exhibits no tenderness.  Dry cough  Abdominal: Soft. Bowel sounds are normal. He exhibits no mass. There is no tenderness. There is no rebound and no guarding.  Obesity  Musculoskeletal: Normal range of motion. He exhibits no edema and no tenderness.  Lymphadenopathy:    He has no cervical adenopathy.  Neurological: He is alert and oriented to person, place, and time. He has normal reflexes. No cranial nerve deficit.  Skin: Skin is warm and dry. No rash noted. No erythema. No pallor.  Psychiatric: He has a normal mood and affect. His behavior is normal. Judgment and thought content normal.   BP 132/77  Pulse 52  Temp(Src) 98.3  F (36.8 C) (Oral)  Ht 5' 5.25" (1.657 m)  Wt 248 lb (112.492 kg)  BMI 40.97 kg/m2   [facility] reading (PRIMARY) by  Dr. Christell Constant: Chest x-ray--  within normal limits                                    Assessment & Plan:   1. Testosterone deficiency   2. Hyperlipidemia   3. GERD (gastroesophageal reflux disease)   4. BPH (benign prostatic hyperplasia)   5. Hypertension   6. Vitamin D deficiency   7. Allergic rhinitis    Orders Placed This Encounter  Procedures  . DG Chest 2 View    Standing Status: Future     Number of Occurrences:      Standing Expiration Date: 07/03/2014    Order Specific Question:  Reason for Exam (SYMPTOM  OR DIAGNOSIS REQUIRED)    Answer:  htn    Order Specific Question:  Preferred imaging location?    Answer:  Internal  . Hepatic function panel  . BMP8+EGFR  . NMR, lipoprofile  . Vit D  25 hydroxy (rtn osteoporosis monitoring)  . POCT CBC   No orders of the defined types were placed in this encounter.   Patient Instructions  Continue current medications. Continue good therapeutic lifestyle changes which includes good diet and exercise. Fall precautions discussed with patient. Follow up as planned and earlier as needed.  Is highly recommended that you get a flu shot as well as the Prevnar. Nasacort over-the-counter 1-2 sprays each nostril at bedtime will help your allergic rhinitis Drink plenty of fluids, use MiraLax as needed for constipation Tdap Will be due next year Return FOBT   Nyra Capes MD

## 2013-05-03 NOTE — Addendum Note (Signed)
Addended by: Magdalene River on: 05/03/2013 09:34 AM   Modules accepted: Orders

## 2013-05-03 NOTE — Patient Instructions (Addendum)
Continue current medications. Continue good therapeutic lifestyle changes which includes good diet and exercise. Fall precautions discussed with patient. Follow up as planned and earlier as needed.  Is highly recommended that you get a flu shot as well as the Prevnar. Nasacort over-the-counter 1-2 sprays each nostril at bedtime will help your allergic rhinitis Drink plenty of fluids, use MiraLax as needed for constipation Tdap Will be due next year Return FOBT

## 2013-05-05 LAB — BMP8+EGFR
BUN/Creatinine Ratio: 18 (ref 10–22)
BUN: 20 mg/dL (ref 8–27)
CO2: 29 mmol/L (ref 18–29)
Calcium: 9.4 mg/dL (ref 8.6–10.2)
Chloride: 100 mmol/L (ref 97–108)
GFR calc Af Amer: 78 mL/min/{1.73_m2} (ref >59)

## 2013-05-05 LAB — HEPATIC FUNCTION PANEL: Total Bilirubin: 0.8 mg/dL (ref 0.0–1.2)

## 2013-05-05 LAB — NMR, LIPOPROFILE
HDL Cholesterol by NMR: 45 mg/dL (ref >=40)
HDL Particle Number: 34.2 umol/L (ref >=30.5)
LDL Particle Number: 1157 nmol/L — ABNORMAL HIGH (ref <1000)
LDL Size: 20 nm — ABNORMAL LOW (ref >20.5)
LDLC SERPL CALC-MCNC: 50 mg/dL (ref <100)
Triglycerides by NMR: 118 mg/dL (ref <150)

## 2013-05-05 LAB — VITAMIN D 25 HYDROXY (VIT D DEFICIENCY, FRACTURES): Vit D, 25-Hydroxy: 59 ng/mL (ref 30.0–100.0)

## 2013-05-06 ENCOUNTER — Ambulatory Visit (INDEPENDENT_AMBULATORY_CARE_PROVIDER_SITE_OTHER): Payer: No Typology Code available for payment source | Admitting: Family Medicine

## 2013-05-06 ENCOUNTER — Ambulatory Visit (INDEPENDENT_AMBULATORY_CARE_PROVIDER_SITE_OTHER): Payer: No Typology Code available for payment source

## 2013-05-06 VITALS — BP 138/77 | HR 62 | Temp 98.0°F | Wt 248.8 lb

## 2013-05-06 DIAGNOSIS — R109 Unspecified abdominal pain: Secondary | ICD-10-CM

## 2013-05-06 DIAGNOSIS — K5900 Constipation, unspecified: Secondary | ICD-10-CM

## 2013-05-06 MED ORDER — LACTULOSE 10 GM/15ML PO SOLN: ORAL | Status: DC

## 2013-05-06 MED ORDER — FLEET ENEMA 7-19 GM/118ML RE ENEM: 1.0000 | ENEMA | Freq: Every day | RECTAL | Status: DC | PRN

## 2013-05-06 NOTE — Progress Notes (Signed)
  Subjective:    Patient ID: [name] [name], male    DOB: [birth year], 67 y.o.   MRN: [id number]  HPI This 67 y.o. male presents for evaluation of constipation.  He has not had a BM in 4 days.   Review of Systems C/o constipation No chest pain, SOB, HA, dizziness, vision change, N/V, diarrhea, constipation, dysuria, urinary urgency or frequency, myalgias, arthralgias or rash.     Objective:   Physical Exam Vital signs noted  Well developed well nourished male.  HEENT - Head atraumatic Normocephalic                Eyes - PERRLA, Conjuctiva - clear Sclera- Clear EOMI                Ears - EAC's Wnl TM's Wnl Gross Hearing WNL                Throat - oropharanx wnl Respiratory - Lungs CTA bilateral Cardiac - RRR S1 and S2 without murmur GI - Abdomen soft Nontender and bowel sounds hypoactive x 4 Abdomen distended Extremities - No edema. Neuro - Grossly intact.       Assessment & Plan:  Abdominal pain, unspecified site - Plan: DG Abd 1 View, lactulose (CHRONULAC) 10 GM/15ML solution, sodium phosphate (FLEET) 7-19 GM/118ML ENEM  Unspecified constipation - Plan: sodium phosphate (FLEET) 7-19 GM/118ML ENEM  Deatra Canter FNP

## 2013-05-06 NOTE — Patient Instructions (Signed)

## 2013-05-10 ENCOUNTER — Ambulatory Visit (INDEPENDENT_AMBULATORY_CARE_PROVIDER_SITE_OTHER): Payer: No Typology Code available for payment source | Admitting: Family Medicine

## 2013-05-10 VITALS — BP 123/68 | HR 58 | Temp 98.0°F | Wt 245.0 lb

## 2013-05-10 DIAGNOSIS — R109 Unspecified abdominal pain: Secondary | ICD-10-CM

## 2013-05-10 DIAGNOSIS — R350 Frequency of micturition: Secondary | ICD-10-CM

## 2013-05-10 DIAGNOSIS — K5900 Constipation, unspecified: Secondary | ICD-10-CM

## 2013-05-10 MED ORDER — CIPROFLOXACIN HCL 500 MG PO TABS: 500.0000 mg | ORAL_TABLET | Freq: Two times a day (BID) | ORAL | Status: DC

## 2013-05-10 MED ORDER — LACTULOSE 10 GM/15ML PO SOLN: ORAL | Status: DC

## 2013-05-10 MED ORDER — TAMSULOSIN HCL 0.4 MG PO CAPS: 0.4000 mg | ORAL_CAPSULE | Freq: Every day | ORAL | Status: DC

## 2013-05-10 MED ORDER — LINACLOTIDE 145 MCG PO CAPS: 145.0000 ug | ORAL_CAPSULE | Freq: Every day | ORAL | Status: DC

## 2013-05-10 NOTE — Patient Instructions (Signed)

## 2013-05-10 NOTE — Progress Notes (Signed)
  Subjective:    Patient ID: [name] [name], male    DOB: 09/29/1945, 67 y.o.   MRN: [id number]  HPI This 67 y.o. male presents for evaluation of constipation.  He was seen 4 days ago for constipation and He did have good results for a couple days and then it quit working.  He has hx of chronic constipation And he has been having problems recently with constipation for 5 days and then he was seen and rx'd  chronulac syrup which he used once a day.  He has not used the fleets enema.  He has had recent Labs which were normal and KUB showed constipation.  He c/o of having to get up every hour to void for The last week.  He has hx of BPH but has not needed any medicine.  He denies fever   Review of Systems C/o constipation and urinary frequency No chest pain, SOB, HA, dizziness, vision change, N/V, diarrhea, myalgias, arthralgias or rash.     Objective:   Physical Exam Vital signs noted  Well developed well nourished male.  HEENT - Head atraumatic Normocephalic                Eyes - PERRLA, Conjuctiva - clear Sclera- Clear EOMI Respiratory - Lungs CTA bilateral Cardiac - RRR S1 and S2 without murmur GI - Abdomen soft Nontender. Distended,  and bowel sounds active x 4 Extremities - No edema. Neuro - Grossly intact.       Assessment & Plan:  Urinary frequency - Plan: tamsulosin (FLOMAX) 0.4 MG CAPS capsule, ciprofloxacin (CIPRO) 500 MG tablet bid x 2 weeks. Discussed with patient that the constipation could have caused prostatitis.  Take abx's for a couple weeks and then Continue flomax for another few weeks and then can try to see if nocturia resolves.  Unspecified constipation - Plan: Linaclotide (LINZESS) 145 MCG CAPS capsule  Abdominal pain, unspecified site - Plan: lactulose (CHRONULAC) 10 GM/15ML solution Advised to increase the chronulac to tid and then start linzess and then cut back on chronulac.  Deatra Canter FNP

## 2013-05-13 ENCOUNTER — Encounter: Payer: Self-pay | Admitting: *Deleted

## 2013-05-24 ENCOUNTER — Other Ambulatory Visit (INDEPENDENT_AMBULATORY_CARE_PROVIDER_SITE_OTHER): Payer: No Typology Code available for payment source

## 2013-05-24 DIAGNOSIS — Z1212 Encounter for screening for malignant neoplasm of rectum: Secondary | ICD-10-CM

## 2013-05-24 NOTE — Progress Notes (Signed)
Patient dropped off fobt 

## 2013-06-01 ENCOUNTER — Encounter: Payer: Self-pay | Admitting: *Deleted

## 2013-06-01 NOTE — Progress Notes (Signed)
Quick Note:  Copy of labs sent to patient ______ 

## 2013-06-05 ENCOUNTER — Other Ambulatory Visit: Payer: Self-pay | Admitting: Nurse Practitioner

## 2013-10-11 ENCOUNTER — Encounter: Payer: Self-pay | Admitting: Family Medicine

## 2013-10-11 ENCOUNTER — Ambulatory Visit (INDEPENDENT_AMBULATORY_CARE_PROVIDER_SITE_OTHER): Payer: No Typology Code available for payment source | Admitting: Family Medicine

## 2013-10-11 VITALS — BP 129/72 | HR 49 | Temp 97.1°F | Ht 65.25 in | Wt 256.0 lb

## 2013-10-11 DIAGNOSIS — L258 Unspecified contact dermatitis due to other agents: Secondary | ICD-10-CM

## 2013-10-11 DIAGNOSIS — E8881 Metabolic syndrome: Secondary | ICD-10-CM

## 2013-10-11 DIAGNOSIS — E785 Hyperlipidemia, unspecified: Secondary | ICD-10-CM

## 2013-10-11 DIAGNOSIS — L299 Pruritus, unspecified: Secondary | ICD-10-CM

## 2013-10-11 DIAGNOSIS — K219 Gastro-esophageal reflux disease without esophagitis: Secondary | ICD-10-CM

## 2013-10-11 DIAGNOSIS — E559 Vitamin D deficiency, unspecified: Secondary | ICD-10-CM

## 2013-10-11 DIAGNOSIS — R609 Edema, unspecified: Secondary | ICD-10-CM

## 2013-10-11 DIAGNOSIS — R600 Localized edema: Secondary | ICD-10-CM

## 2013-10-11 DIAGNOSIS — E291 Testicular hypofunction: Secondary | ICD-10-CM

## 2013-10-11 DIAGNOSIS — I10 Essential (primary) hypertension: Secondary | ICD-10-CM

## 2013-10-11 DIAGNOSIS — N400 Enlarged prostate without lower urinary tract symptoms: Secondary | ICD-10-CM

## 2013-10-11 DIAGNOSIS — L853 Xerosis cutis: Secondary | ICD-10-CM

## 2013-10-11 LAB — POCT CBC
Granulocyte percent: 65.3 %G (ref 37–80)
HCT, POC: 41.5 % — AB (ref 43.5–53.7)
Hemoglobin: 13.6 g/dL — AB (ref 14.1–18.1)
Lymph, poc: 2.1 (ref 0.6–3.4)
MCH: 31.4 pg — AB (ref 27–31.2)
MCHC: 32.7 g/dL (ref 31.8–35.4)
MCV: 96 fL (ref 80–97)
MPV: 6.9 fL (ref 0–99.8)
POC Granulocyte: 4.3 (ref 2–6.9)
POC LYMPH PERCENT: 31.5 %L (ref 10–50)
Platelet Count, POC: 163 10*3/uL (ref 142–424)
RBC: 4.3 M/uL — AB (ref 4.69–6.13)
RDW, POC: 12.1 %
WBC: 6.6 10*3/uL (ref 4.6–10.2)

## 2013-10-11 LAB — POCT GLYCOSYLATED HEMOGLOBIN (HGB A1C): Hemoglobin A1C: 5.6

## 2013-10-11 MED ORDER — AMLODIPINE BESY-BENAZEPRIL HCL 5-10 MG PO CAPS: ORAL_CAPSULE | ORAL | Status: DC

## 2013-10-11 MED ORDER — ROSUVASTATIN CALCIUM 10 MG PO TABS: ORAL_TABLET | ORAL | Status: DC

## 2013-10-11 NOTE — Progress Notes (Signed)
Subjective:    Patient ID: [name] [name], male    DOB: September 27, 1945, 68 y.o.   MRN: [id number]  HPI Pt here for follow up and management of chronic medical problems. He requests refills on Crestor and Lotrel. He is due to get his lab work today. Today with his wife. He does complain of some edema in both feet which seems to get worse during the day. He also complains of some itching. Detergents and fabric softeners were discussed with the patient's wife.         Patient Active Problem List   Diagnosis Date Noted  . Hypertension 12/14/2012  . Vitamin D deficiency 12/14/2012  . Testosterone deficiency   . Hyperlipidemia   . Sinus bradycardia   . GERD (gastroesophageal reflux disease)   . Colon polyp history   . BPH (benign prostatic hyperplasia)    Outpatient Encounter Prescriptions as of 10/11/2013  Medication Sig  . amLODipine-benazepril (LOTREL) 5-10 MG per capsule TAKE ONE CAPSULE BY MOUTH AT BEDTIME  . aspirin 81 MG tablet Take 81 mg by mouth daily.    . Cholecalciferol (VITAMIN D-3) 5000 UNITS TABS Take 1 tablet by mouth daily.    . CRESTOR 10 MG tablet TAKE ONE TABLET BY MOUTH ONE TIME DAILY  . Garlic 532 MG TABS Take 2 tablets by mouth daily.   . NON FORMULARY Take 1 tablet by mouth daily. Equate allergy tablet  . Omega-3 Fatty Acids (FISH OIL) 1000 MG CAPS Take 4 capsules by mouth daily.    Marland Kitchen omeprazole (PRILOSEC) 20 MG capsule Take 20 mg by mouth daily.  . vitamin C (ASCORBIC ACID) 500 MG tablet Take 500 mg by mouth daily.  . vardenafil (LEVITRA) 20 MG tablet Take 20 mg by mouth daily as needed.    . [DISCONTINUED] ciprofloxacin (CIPRO) 500 MG tablet Take 1 tablet (500 mg total) by mouth 2 (two) times daily.  . [DISCONTINUED] lactulose (CHRONULAC) 10 GM/15ML solution Take 30 cc's tid prn constipation  . [DISCONTINUED] Linaclotide (LINZESS) 145 MCG CAPS capsule Take 1 capsule (145 mcg total) by mouth daily.  . [DISCONTINUED] sodium phosphate (FLEET) 7-19 GM/118ML ENEM  Place 1 enema rectally daily as needed.  . [DISCONTINUED] tamsulosin (FLOMAX) 0.4 MG CAPS capsule Take 1 capsule (0.4 mg total) by mouth daily.    Review of Systems  Constitutional: Negative.   HENT: Negative.   Eyes: Negative.   Respiratory: Negative.   Cardiovascular: Negative.   Gastrointestinal: Negative.   Endocrine: Negative.   Genitourinary: Negative.   Musculoskeletal: Negative.   Skin: Negative.   Allergic/Immunologic: Negative.   Neurological: Negative.   Hematological: Negative.   Psychiatric/Behavioral: Negative.        Objective:   Physical Exam  Nursing note and vitals reviewed. Constitutional: He is oriented to person, place, and time. He appears well-developed and well-nourished. No distress.  HENT:  Head: Normocephalic and atraumatic.  Right Ear: External ear normal.  Left Ear: External ear normal.  Nose: Nose normal.  Mouth/Throat: Oropharynx is clear and moist. No oropharyngeal exudate.  Edentulous, bilateral nasal congestion  Eyes: Conjunctivae and EOM are normal. Pupils are equal, round, and reactive to light. Right eye exhibits no discharge. Left eye exhibits no discharge. No scleral icterus.  Neck: Normal range of motion. Neck supple. No thyromegaly present.  No carotid bruits  Cardiovascular: Normal rate, regular rhythm, normal heart sounds and intact distal pulses.  Exam reveals no gallop and no friction rub.   No murmur heard. At 61  per minute  Pulmonary/Chest: Effort normal and breath sounds normal. No respiratory distress. He has no wheezes. He has no rales.  No axillary nodes or chest wall masses  Abdominal: Soft. Bowel sounds are normal. He exhibits no mass. There is no tenderness. There is no rebound and no guarding.  Obesity without tenderness  Musculoskeletal: Normal range of motion. He exhibits edema. He exhibits no tenderness.  2 Plus pretibial  Lymphadenopathy:    He has no cervical adenopathy.  Neurological: He is alert and oriented  to person, place, and time. He has normal reflexes.  Sensation was intact on both feet  Skin: Skin is warm and dry. No rash noted. No erythema. No pallor.  Bilateral toenail fungus  Psychiatric: He has a normal mood and affect. His behavior is normal. Judgment and thought content normal.   BP 129/72  Pulse 49  Temp(Src) 97.1 F (36.2 C) (Oral)  Ht 5' 5.25" (1.657 m)  Wt 256 lb (116.121 kg)  BMI 42.29 kg/m2        Assessment & Plan:  1. BPH (benign prostatic hyperplasia) - POCT CBC  2. GERD (gastroesophageal reflux disease) - POCT CBC  3. Hyperlipidemia - POCT CBC - NMR, lipoprofile  4. Hypertension - POCT CBC - BMP8+EGFR - Hepatic function panel  5. Testosterone deficiency - POCT CBC  6. Vitamin D deficiency - POCT CBC - Vit D  25 hydroxy (rtn osteoporosis monitoring)  7. Metabolic syndrome - POCT CBC - POCT glycosylated hemoglobin (Hb A1C)  8. Edema extremities  9. Pruritus  10. Dry skin dermatitis   Meds ordered this encounter  Medications  . amLODipine-benazepril (LOTREL) 5-10 MG per capsule    Sig: TAKE ONE CAPSULE BY MOUTH AT BEDTIME    Dispense:  90 capsule    Refill:  3  . rosuvastatin (CRESTOR) 10 MG tablet    Sig: TAKE ONE TABLET BY MOUTH ONE TIME DAILY    Dispense:  90 tablet    Refill:  3   Patient Instructions  Continue current medications. Continue good therapeutic lifestyle changes which include good diet and exercise. Fall precautions discussed with patient. If an FOBT was given today- please return it to our front desk. If you are over 5 years old - you may need Prevnar 66 or the adult Pneumonia vaccine.  Continue to pay attention to the amount of salt in your diet in your joints Change to soaps and detergents that are scent free as much as possible Wear good support hose every day and put these on  as soon as you get out of bed Try cortisone cream over-the-counter once or twice daily to itching on legs   Arrie Senate  MD

## 2013-10-11 NOTE — Patient Instructions (Addendum)
Continue current medications. Continue good therapeutic lifestyle changes which include good diet and exercise. Fall precautions discussed with patient. If an FOBT was given today- please return it to our front desk. If you are over 70 years old - you may need Prevnar 99 or the adult Pneumonia vaccine.  Continue to pay attention to the amount of salt in your diet in your joints Change to soaps and detergents that are scent free as much as possible Wear good support hose every day and put these on  as soon as you get out of bed Try cortisone cream over-the-counter once or twice daily to itching on legs

## 2013-10-12 LAB — HEPATIC FUNCTION PANEL
ALK PHOS: 47 IU/L (ref 39–117)
ALT: 19 IU/L (ref 0–44)
AST: 20 IU/L (ref 0–40)
Albumin: 4 g/dL (ref 3.6–4.8)
BILIRUBIN TOTAL: 0.5 mg/dL (ref 0.0–1.2)
Bilirubin, Direct: 0.17 mg/dL (ref 0.00–0.40)
Total Protein: 6.1 g/dL (ref 6.0–8.5)

## 2013-10-12 LAB — BMP8+EGFR
BUN/Creatinine Ratio: 18 (ref 10–22)
BUN: 16 mg/dL (ref 8–27)
CO2: 25 mmol/L (ref 18–29)
CREATININE: 0.9 mg/dL (ref 0.76–1.27)
Calcium: 9.2 mg/dL (ref 8.6–10.2)
Chloride: 101 mmol/L (ref 97–108)
GFR, EST AFRICAN AMERICAN: 100 mL/min/{1.73_m2} (ref >59)
GFR, EST NON AFRICAN AMERICAN: 87 mL/min/{1.73_m2} (ref >59)
Glucose: 109 mg/dL — ABNORMAL HIGH (ref 65–99)
POTASSIUM: 4.7 mmol/L (ref 3.5–5.2)
Sodium: 141 mmol/L (ref 134–144)

## 2013-10-12 LAB — NMR, LIPOPROFILE
Cholesterol: 129 mg/dL (ref <200)
HDL CHOLESTEROL BY NMR: 48 mg/dL (ref >=40)
HDL PARTICLE NUMBER: 35.7 umol/L (ref >=30.5)
LDL Particle Number: 776 nmol/L (ref <1000)
LDL Size: 20.4 nm — ABNORMAL LOW (ref >20.5)
LDLC SERPL CALC-MCNC: 54 mg/dL (ref <100)
LP-IR SCORE: 73 — AB (ref <=45)
SMALL LDL PARTICLE NUMBER: 431 nmol/L (ref <=527)
Triglycerides by NMR: 136 mg/dL (ref <150)

## 2013-10-12 LAB — VITAMIN D 25 HYDROXY (VIT D DEFICIENCY, FRACTURES): VIT D 25 HYDROXY: 58.1 ng/mL (ref 30.0–100.0)

## 2013-11-04 ENCOUNTER — Ambulatory Visit (INDEPENDENT_AMBULATORY_CARE_PROVIDER_SITE_OTHER): Payer: No Typology Code available for payment source | Admitting: Nurse Practitioner

## 2013-11-04 ENCOUNTER — Ambulatory Visit (INDEPENDENT_AMBULATORY_CARE_PROVIDER_SITE_OTHER): Payer: No Typology Code available for payment source

## 2013-11-04 VITALS — BP 126/70 | HR 73 | Temp 97.3°F

## 2013-11-04 DIAGNOSIS — M25561 Pain in right knee: Secondary | ICD-10-CM

## 2013-11-04 DIAGNOSIS — M1711 Unilateral primary osteoarthritis, right knee: Secondary | ICD-10-CM

## 2013-11-04 DIAGNOSIS — M25569 Pain in unspecified knee: Secondary | ICD-10-CM

## 2013-11-04 DIAGNOSIS — M1710 Unilateral primary osteoarthritis, unspecified knee: Secondary | ICD-10-CM

## 2013-11-04 DIAGNOSIS — S99929A Unspecified injury of unspecified foot, initial encounter: Secondary | ICD-10-CM

## 2013-11-04 DIAGNOSIS — S8990XA Unspecified injury of unspecified lower leg, initial encounter: Secondary | ICD-10-CM

## 2013-11-04 DIAGNOSIS — S99919A Unspecified injury of unspecified ankle, initial encounter: Secondary | ICD-10-CM

## 2013-11-04 DIAGNOSIS — IMO0002 Reserved for concepts with insufficient information to code with codable children: Secondary | ICD-10-CM

## 2013-11-04 MED ORDER — TRAMADOL HCL 50 MG PO TABS: 50.0000 mg | ORAL_TABLET | Freq: Three times a day (TID) | ORAL | Status: DC | PRN

## 2013-11-04 NOTE — Patient Instructions (Signed)
Osteoarthritis Osteoarthritis is a disease that causes soreness and swelling (inflammation) of a joint. It occurs when the cartilage at the affected joint wears down. Cartilage acts as a cushion, covering the ends of bones where they meet to form a joint. Osteoarthritis is the most common form of arthritis. It often occurs in older people. The joints affected most often by this condition include those in the:  Ends of the fingers.  Thumbs.  Neck.  Lower back.  Knees.  Hips. CAUSES  Over time, the cartilage that covers the ends of bones begins to wear away. This causes bone to rub on bone, producing pain and stiffness in the affected joints.  RISK FACTORS Certain factors can increase your chances of having osteoarthritis, including:  Older age.  Excessive body weight.  Overuse of joints. SIGNS AND SYMPTOMS   Pain, swelling, and stiffness in the joint.  Over time, the joint may lose its normal shape.  Small deposits of bone (osteophytes) may grow on the edges of the joint.  Bits of bone or cartilage can break off and float inside the joint space. This may cause more pain and damage. DIAGNOSIS  Your health care provider will do a physical exam and ask about your symptoms. Various tests may be ordered, such as:  X-rays of the affected joint.  An MRI scan.  Blood tests to rule out other types of arthritis.  Joint fluid tests. This involves using a needle to draw fluid from the joint and examining the fluid under a microscope. TREATMENT  Goals of treatment are to control pain and improve joint function. Treatment plans may include:  A prescribed exercise program that allows for rest and joint relief.  A weight control plan.  Pain relief techniques, such as:  Properly applied heat and cold.  Electric pulses delivered to nerve endings under the skin (transcutaneous electrical nerve stimulation, TENS).  Massage.  Certain nutritional supplements.  Medicines to  control pain, such as:  Acetaminophen.  Nonsteroidal anti-inflammatory drugs (NSAIDs), such as naproxen.  Narcotic or central-acting agents, such as tramadol.  Corticosteroids. These can be given orally or as an injection.  Surgery to reposition the bones and relieve pain (osteotomy) or to remove loose pieces of bone and cartilage. Joint replacement may be needed in advanced states of osteoarthritis. HOME CARE INSTRUCTIONS   Only take over-the-counter or prescription medicines as directed by your health care provider. Take all medicines exactly as instructed.  Maintain a healthy weight. Follow your health care provider's instructions for weight control. This may include dietary instructions.  Exercise as directed. Your health care provider can recommend specific types of exercise. These may include:  Strengthening exercises These are done to strengthen the muscles that support joints affected by arthritis. They can be performed with weights or with exercise bands to add resistance.  Aerobic activities These are exercises, such as brisk walking or low-impact aerobics, that get your heart pumping.  Range-of-motion activities These keep your joints limber.  Balance and agility exercises These help you maintain daily living skills.  Rest your affected joints as directed by your health care provider.  Follow up with your health care provider as directed. SEEK MEDICAL CARE IF:   Your skin turns red.  You develop a rash in addition to your joint pain.  You have worsening joint pain. SEEK IMMEDIATE MEDICAL CARE IF:  You have a significant loss of weight or appetite.  You have a fever along with joint or muscle aches.  You have   night sweats. FOR MORE INFORMATION  National Institute of Arthritis and Musculoskeletal and Skin Diseases: www.niams.nih.gov National Institute on Aging: www.nia.nih.gov American College of Rheumatology: www.rheumatology.org Document Released: 07/15/2005  Document Revised: 05/05/2013 Document Reviewed: 03/22/2013 [employer] Patient Information 2014 [employer].  

## 2013-11-04 NOTE — Progress Notes (Signed)
   Subjective:    Patient ID: [name] [name], male    DOB: March 11, 1946, 68 y.o.   MRN: [id number]  HPI Patient brought in by wife today with c/o right knee pain- started hurting several years ago but when he got up this morning it was painful to walk on. 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    Review of Systems  Constitutional: Negative.   HENT: Negative.   Respiratory: Negative.   Cardiovascular: Negative.   Musculoskeletal: Positive for joint swelling.  All other systems reviewed and are negative.      Objective:   Physical Exam  Constitutional: He appears well-developed and well-nourished.  Cardiovascular: Normal rate, regular rhythm and normal heart sounds.   Pulmonary/Chest: Effort normal and breath sounds normal.  Musculoskeletal:  Decreased ROM of right knee due to pain in flexion and extension. Mild knee joint effusion No patella tenderness   BP 126/70  Pulse 73  Temp(Src) 97.3 F (36.3 C) (Oral) .right knee x ray- degenerative arthritis with joint space narrowing medially-Preliminary reading by Ronnald Collum, FNP  [facility]        Assessment & Plan:   1. Osteoarthritis of right knee   2. Right knee pain    Meds ordered this encounter  Medications  . traMADol (ULTRAM) 50 MG tablet    Sig: Take 1 tablet (50 mg total) by mouth every 8 (eight) hours as needed.    Dispense:  30 tablet    Refill:  0    Order Specific Question:  Supervising Provider    Answer:  Chipper Herb [[id number]]   Orders Placed This Encounter  Procedures  . DG Knee 1-2 Views Right    Standing Status: Future     Number of Occurrences: 1     Standing Expiration Date: 01/04/2015    Order Specific Question:  Reason for Exam (SYMPTOM  OR DIAGNOSIS REQUIRED)    Answer:  knee pain fallen    Order Specific Question:  Preferred imaging location?    Answer:  Internal  . Ambulatory  referral to Orthopedic Surgery    Referral Priority:  Routine    Referral Type:  Surgical    Referral Reason:  Specialty Services Required    Requested Specialty:  Orthopedic Surgery    Number of Visits Requested:  1   Sedation precautions with ultram Rest Ice if helps Referral to Dr. Luna Glasgow Follow up prn  [place], FNP

## 2014-01-19 ENCOUNTER — Telehealth: Payer: Self-pay | Admitting: Family Medicine

## 2014-01-19 DIAGNOSIS — E785 Hyperlipidemia, unspecified: Secondary | ICD-10-CM

## 2014-01-19 NOTE — Telephone Encounter (Signed)
Medications are too expensive.  Would like to change Lotrel 5/10 to separate prescriptions of Amlodipine 5mg  and Benazepril 10mg . Would also like to change Crestor to something cheaper but wife prefers he not take Lipitor.

## 2014-01-19 NOTE — Telephone Encounter (Signed)
Lipitor 20 generic one daily in the evening #30 refill as needed. Liver function tests in 4 week

## 2014-01-20 NOTE — Telephone Encounter (Signed)
Do you have any other suggestions besides Lipitor? His wife prefers that he not take this because she had pretty severe muscle pain when she took it.

## 2014-01-20 NOTE — Telephone Encounter (Signed)
Pravastatin 40 mg 1 daily at bedtime. LFTs 4 weeks

## 2014-01-21 ENCOUNTER — Telehealth: Payer: Self-pay | Admitting: Family Medicine

## 2014-01-21 MED ORDER — PRAVASTATIN SODIUM 40 MG PO TABS: 40.0000 mg | ORAL_TABLET | Freq: Every day | ORAL | Status: DC

## 2014-01-21 NOTE — Telephone Encounter (Signed)
Patient aware to call and let us know when she needs meds

## 2014-01-24 ENCOUNTER — Telehealth: Payer: Self-pay | Admitting: Family Medicine

## 2014-01-24 DIAGNOSIS — E785 Hyperlipidemia, unspecified: Secondary | ICD-10-CM

## 2014-01-25 NOTE — Telephone Encounter (Signed)
Please take care of this request.

## 2014-01-27 MED ORDER — BENAZEPRIL HCL 10 MG PO TABS: 10.0000 mg | ORAL_TABLET | Freq: Every day | ORAL | Status: DC

## 2014-01-27 MED ORDER — AMLODIPINE BESYLATE 5 MG PO TABS: 5.0000 mg | ORAL_TABLET | Freq: Every day | ORAL | Status: DC

## 2014-01-27 MED ORDER — PRAVASTATIN SODIUM 40 MG PO TABS
40.0000 mg | ORAL_TABLET | Freq: Every day | ORAL | Status: DC
Start: 2014-01-27 — End: 2014-04-05

## 2014-01-27 NOTE — Telephone Encounter (Signed)
Which [facility] pharm??  Needs specific name----    [facility], what med do you want to switch crestor to?? They want generic.

## 2014-01-27 NOTE — Telephone Encounter (Signed)
Done sent to [facility] Pt aware

## 2014-01-27 NOTE — Telephone Encounter (Signed)
Please send these prescriptions to his requested location I know that I recently changed his Crestor to a generic to take

## 2014-02-28 ENCOUNTER — Encounter: Payer: Self-pay | Admitting: Family Medicine

## 2014-02-28 ENCOUNTER — Ambulatory Visit (INDEPENDENT_AMBULATORY_CARE_PROVIDER_SITE_OTHER): Payer: Commercial Managed Care - HMO | Admitting: Family Medicine

## 2014-02-28 VITALS — BP 111/55 | HR 45 | Temp 98.2°F | Ht 65.25 in | Wt 263.0 lb

## 2014-02-28 DIAGNOSIS — E291 Testicular hypofunction: Secondary | ICD-10-CM

## 2014-02-28 DIAGNOSIS — E785 Hyperlipidemia, unspecified: Secondary | ICD-10-CM

## 2014-02-28 DIAGNOSIS — N400 Enlarged prostate without lower urinary tract symptoms: Secondary | ICD-10-CM

## 2014-02-28 DIAGNOSIS — E559 Vitamin D deficiency, unspecified: Secondary | ICD-10-CM

## 2014-02-28 DIAGNOSIS — N528 Other male erectile dysfunction: Secondary | ICD-10-CM

## 2014-02-28 DIAGNOSIS — I10 Essential (primary) hypertension: Secondary | ICD-10-CM

## 2014-02-28 DIAGNOSIS — N390 Urinary tract infection, site not specified: Secondary | ICD-10-CM

## 2014-02-28 DIAGNOSIS — K219 Gastro-esophageal reflux disease without esophagitis: Secondary | ICD-10-CM

## 2014-02-28 DIAGNOSIS — N529 Male erectile dysfunction, unspecified: Secondary | ICD-10-CM

## 2014-02-28 LAB — POCT CBC
GRANULOCYTE PERCENT: 64.1 % (ref 37–80)
HEMATOCRIT: 41.7 % — AB (ref 43.5–53.7)
Hemoglobin: 13.7 g/dL — AB (ref 14.1–18.1)
Lymph, poc: 2 (ref 0.6–3.4)
MCH, POC: 31.4 pg — AB (ref 27–31.2)
MCHC: 32.9 g/dL (ref 31.8–35.4)
MCV: 95.5 fL (ref 80–97)
MPV: 7.4 fL (ref 0–99.8)
POC Granulocyte: 4.1 (ref 2–6.9)
POC LYMPH PERCENT: 31.3 %L (ref 10–50)
Platelet Count, POC: 201 10*3/uL (ref 142–424)
RBC: 4.4 M/uL — AB (ref 4.69–6.13)
RDW, POC: 12.7 %
WBC: 6.4 10*3/uL (ref 4.6–10.2)

## 2014-02-28 LAB — POCT URINALYSIS DIPSTICK
Bilirubin, UA: NEGATIVE
Glucose, UA: NEGATIVE
KETONES UA: NEGATIVE
Nitrite, UA: NEGATIVE
Spec Grav, UA: 1.01
Urobilinogen, UA: NEGATIVE
pH, UA: 6.5

## 2014-02-28 LAB — POCT UA - MICROSCOPIC ONLY
Casts, Ur, LPF, POC: NEGATIVE
Crystals, Ur, HPF, POC: NEGATIVE
Epithelial cells, urine per micros: NEGATIVE
Yeast, UA: NEGATIVE

## 2014-02-28 MED ORDER — TADALAFIL 5 MG PO TABS: 5.0000 mg | ORAL_TABLET | Freq: Every day | ORAL | Status: DC

## 2014-02-28 NOTE — Progress Notes (Signed)
Subjective:    Patient ID: [name] [name], male    DOB: 03-Sep-1945, 68 y.o.   MRN: [id number]  HPI Pt here for follow up and management of chronic medical problems. The patient is doing well no particular complaints. He is to receive an Adacel. He is also due for lab work, a urinalysis, prostate rectal exam,  and a PSA. The patient does indicate that the Cialis 5 mg daily helps with his voiding habits and his nocturia.        Patient Active Problem List   Diagnosis Date Noted  . Hypertension 12/14/2012  . Vitamin D deficiency 12/14/2012  . Testosterone deficiency   . Hyperlipidemia   . Sinus bradycardia   . GERD (gastroesophageal reflux disease)   . Colon polyp history   . BPH (benign prostatic hyperplasia)    Outpatient Encounter Prescriptions as of 02/28/2014  Medication Sig  . amLODipine (NORVASC) 5 MG tablet Take 1 tablet (5 mg total) by mouth daily.  Marland Kitchen aspirin 81 MG tablet Take 81 mg by mouth daily.    . benazepril (LOTENSIN) 10 MG tablet Take 1 tablet (10 mg total) by mouth daily.  . Cholecalciferol (VITAMIN D-3) 5000 UNITS TABS Take 1 tablet by mouth daily.    . Garlic 865 MG TABS Take 2 tablets by mouth daily.   . NON FORMULARY Take 1 tablet by mouth daily. Equate allergy tablet  . Omega-3 Fatty Acids (FISH OIL) 1000 MG CAPS Take 4 capsules by mouth daily.    Marland Kitchen omeprazole (PRILOSEC) 20 MG capsule Take 20 mg by mouth daily.  . pravastatin (PRAVACHOL) 40 MG tablet Take 1 tablet (40 mg total) by mouth daily.  . traMADol (ULTRAM) 50 MG tablet Take 1 tablet (50 mg total) by mouth every 8 (eight) hours as needed.  . vitamin C (ASCORBIC ACID) 500 MG tablet Take 500 mg by mouth daily.  . vardenafil (LEVITRA) 20 MG tablet Take 20 mg by mouth daily as needed.    . [DISCONTINUED] amLODipine-benazepril (LOTREL) 5-10 MG per capsule TAKE ONE CAPSULE BY MOUTH AT BEDTIME    Review of Systems  Constitutional: Negative.   HENT: Negative.   Eyes: Negative.   Respiratory: Negative.    Cardiovascular: Negative.   Gastrointestinal: Negative.   Endocrine: Negative.   Genitourinary: Negative.   Musculoskeletal: Negative.   Skin: Negative.   Allergic/Immunologic: Negative.   Neurological: Negative.   Hematological: Negative.   Psychiatric/Behavioral: Negative.        Objective:   Physical Exam  Nursing note and vitals reviewed. Constitutional: He is oriented to person, place, and time. He appears well-developed and well-nourished.  The patient is pleasant cooperative and overweight. He comes to the visit today with his wife with no specific complaints other than his prostate. He has nocturia and frequency   HENT:  Head: Normocephalic and atraumatic.  Right Ear: External ear normal.  Left Ear: External ear normal.  Mouth/Throat: Oropharynx is clear and moist. No oropharyngeal exudate.  There is nasal congestion.  Eyes: Conjunctivae and EOM are normal. Pupils are equal, round, and reactive to light. Right eye exhibits no discharge. Left eye exhibits no discharge. No scleral icterus.  Neck: Normal range of motion. Neck supple. No tracheal deviation present. No thyromegaly present.  No carotid bruits  Cardiovascular: Normal rate, regular rhythm, normal heart sounds and intact distal pulses.  Exam reveals no gallop and no friction rub.   No murmur heard. At 60 per minute  Pulmonary/Chest: Effort normal and  breath sounds normal. No respiratory distress. He has no wheezes. He has no rales. He exhibits no tenderness.  Abdominal: Soft. Bowel sounds are normal. He exhibits no mass. There is no tenderness. There is no rebound and no guarding.  Obesity without mass or tenderness  Genitourinary: Rectum normal, prostate normal and penis normal.  There was no hernia palpated bilaterally. The external genitalia were within normal limits. The rectal exam was normal. The prostate was slightly enlarged but smooth without lumps or masses.  Musculoskeletal: Normal range of motion. He  exhibits no edema and no tenderness.  Lymphadenopathy:    He has no cervical adenopathy.  Neurological: He is alert and oriented to person, place, and time. He has normal reflexes. No cranial nerve deficit.  Skin: Skin is warm and dry. No rash noted. No erythema. No pallor.  Psychiatric: He has a normal mood and affect. His behavior is normal. Judgment and thought content normal.   BP 111/55  Pulse 45  Temp(Src) 98.2 F (36.8 C) (Oral)  Ht 5' 5.25" (1.657 m)  Wt 263 lb (119.296 kg)  BMI 43.45 kg/m2        Assessment & Plan:  1. BPH (benign prostatic hyperplasia) - POCT CBC - POCT urinalysis dipstick - POCT UA - Microscopic Only - PSA, total and free  2. Gastroesophageal reflux disease, esophagitis presence not specified - POCT CBC  3. Essential hypertension - POCT CBC - BMP8+EGFR - Hepatic function panel  4. Hyperlipidemia - POCT CBC - NMR, lipoprofile  5. Testosterone deficiency - POCT CBC - POCT urinalysis dipstick - POCT UA - Microscopic Only - PSA, total and free - Testosterone,Free and Total  6. Vitamin D deficiency - POCT CBC - Vit D  25 hydroxy (rtn osteoporosis monitoring)  7. Other male erectile dysfunction  8. Nocturia  Meds ordered this encounter  Medications  . tadalafil (CIALIS) 5 MG tablet    Sig: Take 1 tablet (5 mg total) by mouth daily.    Dispense:  30 tablet    Refill:  1   Patient Instructions                       Medicare Annual Wellness Visit  [facility] and the medical providers at [facility] strive to bring you the best medical care.  In doing so we not only want to address your current medical conditions and concerns but also to detect new conditions early and prevent illness, disease and health-related problems.    Medicare offers a yearly Wellness Visit which allows our clinical staff to assess your need for preventative services including immunizations, lifestyle education, counseling to decrease  risk of preventable diseases and screening for fall risk and other medical concerns.    This visit is provided free of charge (no copay) for all Medicare recipients. The clinical pharmacists at [facility] have begun to conduct these Wellness Visits which will also include a thorough review of all your medications.    As you primary medical provider recommend that you make an appointment for your Annual Wellness Visit if you have not done so already this year.  You may set up this appointment before you leave today or you may call back ([phone]) and schedule an appointment.  Please make sure when you call that you mention that you are scheduling your Annual Wellness Visit with the clinical pharmacist so that the appointment may be made for the proper length of time.  Continue current medications. Continue good therapeutic lifestyle changes which include good diet and exercise. Fall precautions discussed with patient. If an FOBT was given today- please return it to our front desk. If you are over 13 years old - you may need Prevnar 74 or the adult Pneumonia vaccine.  Watch caffeine intake Watch sodium intake Please check with your insurance regarding the tetanus vaccine Continue to take Cialis 5 mg daily Drink plenty of fluids especially this summer with the heat being so high   Arrie Senate MD

## 2014-02-28 NOTE — Addendum Note (Signed)
Addended by: Selmer Dominion on: 02/28/2014 02:34 PM   Modules accepted: Orders

## 2014-02-28 NOTE — Patient Instructions (Addendum)
Medicare Annual Wellness Visit  [facility] and the medical providers at [facility] strive to bring you the best medical care.  In doing so we not only want to address your current medical conditions and concerns but also to detect new conditions early and prevent illness, disease and health-related problems.    Medicare offers a yearly Wellness Visit which allows our clinical staff to assess your need for preventative services including immunizations, lifestyle education, counseling to decrease risk of preventable diseases and screening for fall risk and other medical concerns.    This visit is provided free of charge (no copay) for all Medicare recipients. The clinical pharmacists at [facility] have begun to conduct these Wellness Visits which will also include a thorough review of all your medications.    As you primary medical provider recommend that you make an appointment for your Annual Wellness Visit if you have not done so already this year.  You may set up this appointment before you leave today or you may call back ([phone]) and schedule an appointment.  Please make sure when you call that you mention that you are scheduling your Annual Wellness Visit with the clinical pharmacist so that the appointment may be made for the proper length of time.     Continue current medications. Continue good therapeutic lifestyle changes which include good diet and exercise. Fall precautions discussed with patient. If an FOBT was given today- please return it to our front desk. If you are over 52 years old - you may need Prevnar 89 or the adult Pneumonia vaccine.  Watch caffeine intake Watch sodium intake Please check with your insurance regarding the tetanus vaccine Continue to take Cialis 5 mg daily Drink plenty of fluids especially this summer with the heat being so high

## 2014-03-01 LAB — NMR, LIPOPROFILE
CHOLESTEROL: 155 mg/dL (ref 100–199)
HDL Cholesterol by NMR: 40 mg/dL (ref >39)
HDL Particle Number: 34.6 umol/L (ref >=30.5)
LDL Particle Number: 1276 nmol/L — ABNORMAL HIGH (ref <1000)
LDL SIZE: 20.2 nm (ref >20.5)
LDLC SERPL CALC-MCNC: 84 mg/dL (ref 0–99)
LP-IR SCORE: 72 — AB (ref <=45)
SMALL LDL PARTICLE NUMBER: 801 nmol/L — AB (ref <=527)
Triglycerides by NMR: 157 mg/dL — ABNORMAL HIGH (ref 0–149)

## 2014-03-01 LAB — BMP8+EGFR
BUN/Creatinine Ratio: 15 (ref 10–22)
BUN: 13 mg/dL (ref 8–27)
CO2: 26 mmol/L (ref 18–29)
Calcium: 9.2 mg/dL (ref 8.6–10.2)
Chloride: 101 mmol/L (ref 97–108)
Creatinine, Ser: 0.89 mg/dL (ref 0.76–1.27)
GFR, EST AFRICAN AMERICAN: 101 mL/min/{1.73_m2} (ref >59)
GFR, EST NON AFRICAN AMERICAN: 87 mL/min/{1.73_m2} (ref >59)
GLUCOSE: 100 mg/dL — AB (ref 65–99)
Potassium: 4.4 mmol/L (ref 3.5–5.2)
SODIUM: 142 mmol/L (ref 134–144)

## 2014-03-01 LAB — VITAMIN D 25 HYDROXY (VIT D DEFICIENCY, FRACTURES): Vit D, 25-Hydroxy: 53.4 ng/mL (ref 30.0–100.0)

## 2014-03-01 LAB — PSA, TOTAL AND FREE
PSA FREE PCT: 16 %
PSA, Free: 0.32 ng/mL
PSA: 2 ng/mL (ref 0.0–4.0)

## 2014-03-01 LAB — TESTOSTERONE,FREE AND TOTAL
TESTOSTERONE FREE: 5.2 pg/mL — AB (ref 6.6–18.1)
Testosterone: 114 ng/dL — ABNORMAL LOW (ref 348–1197)

## 2014-03-01 LAB — HEPATIC FUNCTION PANEL
ALK PHOS: 45 IU/L (ref 39–117)
ALT: 17 IU/L (ref 0–44)
AST: 19 IU/L (ref 0–40)
Albumin: 4.1 g/dL (ref 3.6–4.8)
BILIRUBIN DIRECT: 0.14 mg/dL (ref 0.00–0.40)
TOTAL PROTEIN: 5.9 g/dL — AB (ref 6.0–8.5)
Total Bilirubin: 0.5 mg/dL (ref 0.0–1.2)

## 2014-03-02 ENCOUNTER — Telehealth: Payer: Self-pay | Admitting: Family Medicine

## 2014-03-02 LAB — URINE CULTURE: ORGANISM ID, BACTERIA: NO GROWTH

## 2014-03-02 NOTE — Telephone Encounter (Signed)
Message copied by Waverly Ferrari on Wed Mar 02, 2014  9:47 AM ------      Message from: Chipper Herb      Created: Tue Mar 01, 2014  2:47 PM       The blood sugar is elevated at 100. This is consistent with past readings. The creatinine, the most important kidney function test is within normal limits. Electrolytes included potassium are within normal limits.      The total protein on the liver function test is slightly decreased. All other tests are within normal      Cholesterol numbers with advanced lipid testing had a total LDL particle member that is elevated from what it was 4 months ago. Is now 1276. This number should be less than 1000. The LDL C. is good. The triglycerides are also elevated.------- continue with current cholesterol medication but patient must try to do better with his diet and exercise      The PSA is 2.0 and this is good and within normal limits and stable from past readings .      The vitamin D level is good at 53.4. Continue current treatment      Both the total testosterone and free direct testosterone levels are low.--------- please confirm with the patient what he is currently doing about the low testosterone levels.???? ------

## 2014-03-08 ENCOUNTER — Encounter: Payer: Self-pay | Admitting: Family Medicine

## 2014-04-05 ENCOUNTER — Ambulatory Visit (INDEPENDENT_AMBULATORY_CARE_PROVIDER_SITE_OTHER): Payer: Commercial Managed Care - HMO | Admitting: Family Medicine

## 2014-04-05 ENCOUNTER — Ambulatory Visit (INDEPENDENT_AMBULATORY_CARE_PROVIDER_SITE_OTHER): Payer: Commercial Managed Care - HMO

## 2014-04-05 ENCOUNTER — Emergency Department (HOSPITAL_COMMUNITY)
Admission: EM | Admit: 2014-04-05 | Discharge: 2014-04-06 | Disposition: A | Discharge status: Home / Self Care | Payer: Medicare HMO | Attending: Emergency Medicine | Admitting: Emergency Medicine

## 2014-04-05 ENCOUNTER — Encounter: Payer: Self-pay | Admitting: Family Medicine

## 2014-04-05 ENCOUNTER — Encounter (HOSPITAL_COMMUNITY): Payer: Self-pay | Admitting: Emergency Medicine

## 2014-04-05 VITALS — BP 125/78 | HR 84 | Temp 96.9°F | Ht 65.25 in | Wt 261.0 lb

## 2014-04-05 DIAGNOSIS — Z79899 Other long term (current) drug therapy: Secondary | ICD-10-CM | POA: Insufficient documentation

## 2014-04-05 DIAGNOSIS — Z862 Personal history of diseases of the blood and blood-forming organs and certain disorders involving the immune mechanism: Secondary | ICD-10-CM | POA: Insufficient documentation

## 2014-04-05 DIAGNOSIS — Z860100 Personal history of colon polyps, unspecified: Secondary | ICD-10-CM | POA: Insufficient documentation

## 2014-04-05 DIAGNOSIS — Z7982 Long term (current) use of aspirin: Secondary | ICD-10-CM | POA: Diagnosis not present

## 2014-04-05 DIAGNOSIS — I498 Other specified cardiac arrhythmias: Secondary | ICD-10-CM | POA: Diagnosis not present

## 2014-04-05 DIAGNOSIS — R109 Unspecified abdominal pain: Secondary | ICD-10-CM

## 2014-04-05 DIAGNOSIS — K5900 Constipation, unspecified: Secondary | ICD-10-CM | POA: Diagnosis present

## 2014-04-05 DIAGNOSIS — Z8639 Personal history of other endocrine, nutritional and metabolic disease: Secondary | ICD-10-CM | POA: Insufficient documentation

## 2014-04-05 DIAGNOSIS — F17200 Nicotine dependence, unspecified, uncomplicated: Secondary | ICD-10-CM | POA: Insufficient documentation

## 2014-04-05 DIAGNOSIS — Z87448 Personal history of other diseases of urinary system: Secondary | ICD-10-CM | POA: Insufficient documentation

## 2014-04-05 DIAGNOSIS — K219 Gastro-esophageal reflux disease without esophagitis: Secondary | ICD-10-CM | POA: Insufficient documentation

## 2014-04-05 MED ORDER — MAGNESIUM CITRATE PO SOLN
1.0000 | Freq: Once | ORAL | Status: AC
  Administered 2014-04-05: 1 via ORAL
  Filled 2014-04-05: qty 296

## 2014-04-05 NOTE — ED Notes (Signed)
Pt reports constipation x 3 days. Denies N/V, denies CP, denies abdominal pain but states "I just feel full." Hx of constipation. Reports trying multiple enemas with no relief. Pt in NAD. AO x4.

## 2014-04-05 NOTE — Progress Notes (Signed)
   Subjective:    Patient ID: [name] [name], male    DOB: 1946/01/20, 68 y.o.   MRN: [id number]  HPI This 68 y.o. male presents for evaluation of abdominal discomfort.  He has been having problems with constipation.  He has not had BM in last few days and has abdominal distention and discomfort.   Review of Systems C/o constipation No chest pain, SOB, HA, dizziness, vision change, N/V, diarrhea, dysuria, urinary urgency or frequency, myalgias, arthralgias or rash.     Objective:   Physical Exam Vital signs noted  Well developed well nourished male.  HEENT - Head atraumatic Normocephalic                Throat - oropharanx wnl Respiratory - Lungs CTA bilateral Cardiac - RRR S1 and S2 without murmur GI - Abdomen diffusely tender w/o guarding and distended and bowel sounds active x 4 Extremities - No edema. Neuro - Grossly intact.  KUB - constipation Prelimnary reading by Iverson Alamin     Assessment & Plan:  Abdominal pain, unspecified site - Plan: DG Abd 1 View  Unspecified constipation - Plan: DG Abd 1 View  RX for magnesium citrate otc #2 bottles and use one now and repeat in 2 hours if no results. Fleets enema #2 do one now and repeat in 2 hours.  If not better then go to ED.  Lysbeth Penner FNP

## 2014-04-05 NOTE — ED Provider Notes (Signed)
CSN: [id number]     Arrival date & time 04/05/14  1956 History   First MD Initiated Contact with Patient 04/05/14 2255     Chief Complaint  Patient presents with  . Constipation     (Consider location/radiation/quality/duration/timing/severity/associated sxs/prior Treatment) HPI [name] [name] is a 68 y.o. male with no significant past medical history coming in with constipation. Patient states for the past year he has had a bowel movement every 3 days. Her last bowel movement was 3 days ago. It was soft with no blood. Patient has tried lactulose, enemas, and magnesium citrate today. He still is unable to have bowel movement. He's denying any abdominal pain nausea vomiting or fevers. Patient states his abdomen does feel full, but there is no pain. She was seen in urgent care today, they did an x-ray and he was transferred here for concerns with the findings. He's denying chest pain or shortness of breath.  10 Systems reviewed and are negative for acute change except as noted in the HPI.    Past Medical History  Diagnosis Date  . Other testicular hypofunction   . Other and unspecified hyperlipidemia   . Sinus bradycardia   . GERD (gastroesophageal reflux disease)   . Colon polyp   . Hemorrhoid   . Hyperplasia of prostate    Past Surgical History  Procedure Laterality Date  . Appendectomy     Family History  Problem Relation Age of Onset  . Heart attack Mother   . Stroke Mother   . Diabetes Father    History  Substance Use Topics  . Smoking status: Light Tobacco Smoker    Types: Cigars  . Smokeless tobacco: Current User    Types: Chew  . Alcohol Use: Yes     Comment: occasional    Review of Systems    Allergies  Cialis and Viagra  Home Medications   Prior to Admission medications   Medication Sig Start Date End Date Taking? Authorizing Provider  amLODipine (NORVASC) 5 MG tablet Take 5 mg by mouth daily. 01/27/14  Yes Chipper Herb, MD  aspirin 81 MG tablet Take  81 mg by mouth daily.     Yes Historical Provider, MD  benazepril (LOTENSIN) 10 MG tablet Take 10 mg by mouth daily. 01/27/14  Yes Chipper Herb, MD  Cholecalciferol (VITAMIN D-3) 5000 UNITS TABS Take 1 tablet by mouth daily.     Yes Historical Provider, MD  Garlic 784 MG TABS Take 2 tablets by mouth daily.    Yes Historical Provider, MD  NON FORMULARY Take 1 tablet by mouth daily. Equate allergy tablet   Yes Historical Provider, MD  Omega-3 Fatty Acids (FISH OIL) 1000 MG CAPS Take 4 capsules by mouth daily.    Yes Historical Provider, MD  omeprazole (PRILOSEC) 20 MG capsule Take 20 mg by mouth daily.   Yes Historical Provider, MD  pravastatin (PRAVACHOL) 40 MG tablet Take 40 mg by mouth daily. 01/27/14  Yes Chipper Herb, MD  traMADol (ULTRAM) 50 MG tablet Take 1 tablet (50 mg total) by mouth every 8 (eight) hours as needed. 11/04/13  Yes Mary-Margaret Hassell Done, FNP  vardenafil (LEVITRA) 20 MG tablet Take 20 mg by mouth daily as needed.     Yes Historical Provider, MD  vitamin C (ASCORBIC ACID) 500 MG tablet Take 500 mg by mouth daily.   Yes Historical Provider, MD   BP 144/71  Pulse 55  Temp(Src) 97.8 F (36.6 C) (Oral)  Resp 20  Ht 5\' 6"  (1.676 m)  Wt 260 lb (117.935 kg)  BMI 41.99 kg/m2  SpO2 96% Physical Exam  Nursing note and vitals reviewed. Constitutional: He is oriented to person, place, and time. Vital signs are normal. He appears well-developed and well-nourished.  Non-toxic appearance. He does not appear ill. No distress.  Resting comfortably when I entered the room.  HENT:  Head: Normocephalic and atraumatic.  Nose: Nose normal.  Mouth/Throat: Oropharynx is clear and moist. No oropharyngeal exudate.  Eyes: Conjunctivae and EOM are normal. Pupils are equal, round, and reactive to light. No scleral icterus.  Neck: Normal range of motion. Neck supple. No tracheal deviation, no edema, no erythema and normal range of motion present. No mass and no thyromegaly present.   Cardiovascular: Normal rate, regular rhythm, S1 normal, S2 normal, normal heart sounds, intact distal pulses and normal pulses.  Exam reveals no gallop and no friction rub.   No murmur heard. Pulses:      Radial pulses are 2+ on the right side, and 2+ on the left side.       Dorsalis pedis pulses are 2+ on the right side, and 2+ on the left side.  Pulmonary/Chest: Effort normal and breath sounds normal. No respiratory distress. He has no wheezes. He has no rhonchi. He has no rales.  Abdominal: Soft. Normal appearance and bowel sounds are normal. He exhibits distension. He exhibits no ascites and no mass. There is no hepatosplenomegaly. There is no tenderness. There is no rebound, no guarding and no CVA tenderness.  Genitourinary: Rectum normal.  No stool in rectal vault. No tenderness on exam.  Musculoskeletal: Normal range of motion. He exhibits no edema and no tenderness.  Lymphadenopathy:    He has no cervical adenopathy.  Neurological: He is alert and oriented to person, place, and time. He has normal strength. No cranial nerve deficit or sensory deficit. GCS eye subscore is 4. GCS verbal subscore is 5. GCS motor subscore is 6.  Skin: Skin is warm, dry and intact. No petechiae and no rash noted. He is not diaphoretic. No erythema. No pallor.  Psychiatric: He has a normal mood and affect. His behavior is normal. Judgment normal.    ED Course  Procedures (including critical care time) Labs Review Labs Reviewed - No data to display  Imaging Review Dg Abd 1 View  04/05/2014   CLINICAL DATA:  Abdominal pain, constipation  EXAM: ABDOMEN - 1 VIEW  COMPARISON:  Abdomen films of 05/06/2013  FINDINGS: There is a moderately large amount of feces throughout the colon consistent with the clinical diagnosis of constipation. No bowel obstruction is seen. Opacities in the right abdomen are consistent with ingested material within the right colon. No opaque calculi are seen. No bony abnormality is noted.   IMPRESSION: Moderately large amount of feces throughout the colon. No bowel obstruction.   Electronically Signed   By: Ivar Drape M.D.   On: 04/05/2014 10:46     EKG Interpretation None      MDM   Final diagnoses:  None    Patient is in today out of concern for constipation. He states now for the past year he has had one bowel movement every 3 days. Patient has not described any new changes today regarding abdominal pain or fevers or vomiting. X-ray from urgent care reveals large amount of feces in the colon. Patient was given magnesium citrate in emergency department. We'll reassess.  Patient did not have a bowel movement in the emergency  department. He was observed for 2 hours. He's had no worsening abdominal pain nausea or vomiting. He was told to followup as primary care physician this week for continued care of his constipation. He does have one more dose of magnesium citrate at home which she was told to take tomorrow afternoon if he still does not have a bowel movement. Return precautions given. Vital signs have been within his normal limits, safe for discharge.  Everlene Balls, MD 04/06/14 [phone]

## 2014-04-06 NOTE — Discharge Instructions (Signed)
Constipation [name] [name], you were seen today for constipation.  You can take mag citrate tomorrow afternoon if you still have not had a bowel movement.  Regardless, you need to follow up with your regular doctor this week for continued care.  If any worsening symptoms or persistent vomiting, return to the ED immediately for repeat evaluation.  Thank you. Constipation is when a person:  Poops (has a bowel movement) less than 3 times a week.  Has a hard time pooping.  Has poop that is dry, hard, or bigger than normal. HOME CARE   Eat foods with a lot of fiber in them. This includes fruits, vegetables, beans, and whole grains such as brown rice.  Avoid fatty foods and foods with a lot of sugar. This includes french fries, hamburgers, cookies, candy, and soda.  If you are not getting enough fiber from food, take products with added fiber in them (supplements).  Drink enough fluid to keep your pee (urine) clear or pale yellow.  Exercise on a regular basis, or as told by your doctor.  Go to the restroom when you feel like you need to poop. Do not hold it.  Only take medicine as told by your doctor. Do not take medicines that help you poop (laxatives) without talking to your doctor first. GET HELP RIGHT AWAY IF:   You have bright red blood in your poop (stool).  Your constipation lasts more than 4 days or gets worse.  You have belly (abdominal) or butt (rectal) pain.  You have thin poop (as thin as a pencil).  You lose weight, and it cannot be explained. MAKE SURE YOU:   Understand these instructions.  Will watch your condition.  Will get help right away if you are not doing well or get worse. Document Released: 01/01/2008 Document Revised: 07/20/2013 Document Reviewed: 04/26/2013 [facility] Patient Information 2015 [place], [place]. This information is not intended to replace advice given to you by your health care provider. Make sure you discuss any questions you have with your  health care provider.

## 2014-04-06 NOTE — ED Notes (Signed)
Pt A&OX4, ambulatory at d/c with steady gait NAD

## 2014-04-06 NOTE — ED Notes (Signed)
Pt reports he just had a bowel movement while in the bathroom and that he feels much better.

## 2014-04-06 NOTE — ED Notes (Signed)
Dr. Oni at bedside. 

## 2014-04-09 ENCOUNTER — Encounter (HOSPITAL_COMMUNITY): Payer: Self-pay | Admitting: Emergency Medicine

## 2014-04-09 ENCOUNTER — Emergency Department (HOSPITAL_COMMUNITY)
Admission: EM | Admit: 2014-04-09 | Discharge: 2014-04-10 | Disposition: A | Discharge status: Home / Self Care | Payer: Medicare HMO | Attending: Emergency Medicine | Admitting: Emergency Medicine

## 2014-04-09 DIAGNOSIS — E785 Hyperlipidemia, unspecified: Secondary | ICD-10-CM | POA: Insufficient documentation

## 2014-04-09 DIAGNOSIS — N1330 Unspecified hydronephrosis: Secondary | ICD-10-CM | POA: Diagnosis not present

## 2014-04-09 DIAGNOSIS — Z860100 Personal history of colon polyps, unspecified: Secondary | ICD-10-CM | POA: Insufficient documentation

## 2014-04-09 DIAGNOSIS — R109 Unspecified abdominal pain: Secondary | ICD-10-CM | POA: Insufficient documentation

## 2014-04-09 DIAGNOSIS — Z79899 Other long term (current) drug therapy: Secondary | ICD-10-CM | POA: Insufficient documentation

## 2014-04-09 DIAGNOSIS — E291 Testicular hypofunction: Secondary | ICD-10-CM | POA: Diagnosis not present

## 2014-04-09 DIAGNOSIS — Z7982 Long term (current) use of aspirin: Secondary | ICD-10-CM | POA: Insufficient documentation

## 2014-04-09 DIAGNOSIS — F17200 Nicotine dependence, unspecified, uncomplicated: Secondary | ICD-10-CM | POA: Diagnosis not present

## 2014-04-09 DIAGNOSIS — IMO0002 Reserved for concepts with insufficient information to code with codable children: Secondary | ICD-10-CM | POA: Diagnosis not present

## 2014-04-09 DIAGNOSIS — N200 Calculus of kidney: Secondary | ICD-10-CM | POA: Diagnosis not present

## 2014-04-09 DIAGNOSIS — K219 Gastro-esophageal reflux disease without esophagitis: Secondary | ICD-10-CM | POA: Diagnosis not present

## 2014-04-09 LAB — URINALYSIS, ROUTINE W REFLEX MICROSCOPIC
Bilirubin Urine: NEGATIVE
Glucose, UA: NEGATIVE mg/dL
Ketones, ur: NEGATIVE mg/dL
NITRITE: NEGATIVE
Protein, ur: 100 mg/dL — AB
SPECIFIC GRAVITY, URINE: 1.018 (ref 1.005–1.030)
Urobilinogen, UA: 1 mg/dL (ref 0.0–1.0)
pH: 6 (ref 5.0–8.0)

## 2014-04-09 LAB — URINE MICROSCOPIC-ADD ON

## 2014-04-09 NOTE — ED Notes (Addendum)
The patient said he has had flank pain for about a month.  He said today, he urinated and had blood in his urine.  He has taken tylenol for the pain in the past but this time he has not taken anything.  He rates his pain 5/10.  The patient did say he has burning with urinating as well.

## 2014-04-10 ENCOUNTER — Emergency Department (HOSPITAL_COMMUNITY): Payer: Medicare HMO

## 2014-04-10 LAB — COMPREHENSIVE METABOLIC PANEL
ALBUMIN: 3.1 g/dL — AB (ref 3.5–5.2)
ALK PHOS: 54 U/L (ref 39–117)
ALT: 19 U/L (ref 0–53)
AST: 23 U/L (ref 0–37)
Anion gap: 8 (ref 5–15)
BUN: 20 mg/dL (ref 6–23)
CALCIUM: 9.6 mg/dL (ref 8.4–10.5)
CO2: 31 mEq/L (ref 19–32)
Chloride: 101 mEq/L (ref 96–112)
Creatinine, Ser: 1.09 mg/dL (ref 0.50–1.35)
GFR calc non Af Amer: 67 mL/min — ABNORMAL LOW (ref >90)
GFR, EST AFRICAN AMERICAN: 78 mL/min — AB (ref >90)
Glucose, Bld: 135 mg/dL — ABNORMAL HIGH (ref 70–99)
POTASSIUM: 4.8 meq/L (ref 3.7–5.3)
SODIUM: 140 meq/L (ref 137–147)
Total Bilirubin: <0.2 mg/dL — ABNORMAL LOW (ref 0.3–1.2)
Total Protein: 6.7 g/dL (ref 6.0–8.3)

## 2014-04-10 MED ORDER — IBUPROFEN 800 MG PO TABS: 800.0000 mg | ORAL_TABLET | Freq: Three times a day (TID) | ORAL | Status: DC | PRN

## 2014-04-10 MED ORDER — SODIUM CHLORIDE 0.9 % IV BOLUS (SEPSIS)
1000.0000 mL | Freq: Once | INTRAVENOUS | Status: AC
  Administered 2014-04-10: 1000 mL via INTRAVENOUS

## 2014-04-10 NOTE — Discharge Instructions (Signed)
Kidney Stones [name] [name], you were seen today for back pain.  Your CT scan showed kidney stones.  One was obstructing in your bladder.  Your pain has resolved without any medication.  It is likely that you passed the kidney stone.  Follow up with the urologist this week for continued care.  If your symptoms worsen, return to the ED for repeat evaluation. Thank you.  Kidney stones (urolithiasis) are solid masses that form inside your kidneys. The intense pain is caused by the stone moving through the kidney, ureter, bladder, and urethra (urinary tract). When the stone moves, the ureter starts to spasm around the stone. The stone is usually passed in your pee (urine).  HOME CARE  Drink enough fluids to keep your pee clear or pale yellow. This helps to get the stone out.  Strain all pee through the provided strainer. Do not pee without peeing through the strainer, not even once. If you pee the stone out, catch it in the strainer. The stone may be as small as a grain of salt. Take this to your doctor. This will help your doctor figure out what you can do to try to prevent more kidney stones.  Only take medicine as told by your doctor.  Follow up with your doctor as told.  Get follow-up X-rays as told by your doctor. GET HELP IF: You have pain that gets worse even if you have been taking pain medicine. GET HELP RIGHT AWAY IF:   Your pain does not get better with medicine.  You have a fever or shaking chills.  Your pain increases and gets worse over 18 hours.  You have new belly (abdominal) pain.  You feel faint or pass out.  You are unable to pee. MAKE SURE YOU:   Understand these instructions.  Will watch your condition.  Will get help right away if you are not doing well or get worse. Document Released: 01/01/2008 Document Revised: 03/17/2013 Document Reviewed: 12/16/2012 [facility] Patient Information 2015 [place], [place]. This information is not intended to replace advice given to  you by your health care provider. Make sure you discuss any questions you have with your health care provider.

## 2014-04-10 NOTE — ED Provider Notes (Signed)
CSN: [id number]     Arrival date & time 04/09/14  2221 History   First MD Initiated Contact with Patient 04/10/14 0013     Chief Complaint  Patient presents with  . Flank Pain    The patient said he had blood in his urine about two hours ago.  The patient said he has not urinated since he had the blood in the urine two hours ago.  . Hematuria     (Consider location/radiation/quality/duration/timing/severity/associated sxs/prior Treatment) HPI  [name] [name] is a 68yo male presenting with sudden onset R flank pain.  This has been occuring for the past month but significantly worse today.  He has had hematuria and dysuria as well.  He denies fevers, diaphoresis, or recent infections.  He is constipated at baseline.  He admits to h/o kidney stones.  Patient has no further complaints and is not wanting pain medication.   10 Systems reviewed and are negative for acute change except as noted in the HPI.   Past Medical History  Diagnosis Date  . Other testicular hypofunction   . Other and unspecified hyperlipidemia   . Sinus bradycardia   . GERD (gastroesophageal reflux disease)   . Colon polyp   . Hemorrhoid   . Hyperplasia of prostate    Past Surgical History  Procedure Laterality Date  . Appendectomy     Family History  Problem Relation Age of Onset  . Heart attack Mother   . Stroke Mother   . Diabetes Father    History  Substance Use Topics  . Smoking status: Light Tobacco Smoker    Types: Cigars  . Smokeless tobacco: Current User    Types: Chew  . Alcohol Use: Yes     Comment: occasional    Review of Systems    Allergies  Cialis and Viagra  Home Medications   Prior to Admission medications   Medication Sig Start Date End Date Taking? Authorizing Provider  amLODipine (NORVASC) 5 MG tablet Take 5 mg by mouth daily. 01/27/14  Yes Chipper Herb, MD  aspirin 81 MG tablet Take 81 mg by mouth daily.     Yes Historical Provider, MD  benazepril (LOTENSIN) 10 MG tablet  Take 10 mg by mouth daily. 01/27/14  Yes Chipper Herb, MD  Cholecalciferol (VITAMIN D-3) 5000 UNITS TABS Take 1 tablet by mouth daily.     Yes Historical Provider, MD  Garlic 297 MG TABS Take 2 tablets by mouth daily.    Yes Historical Provider, MD  NON FORMULARY Take 1 tablet by mouth daily. Equate allergy tablet   Yes Historical Provider, MD  Omega-3 Fatty Acids (FISH OIL) 1000 MG CAPS Take 4 capsules by mouth daily.    Yes Historical Provider, MD  omeprazole (PRILOSEC) 20 MG capsule Take 20 mg by mouth daily.   Yes Historical Provider, MD  pravastatin (PRAVACHOL) 40 MG tablet Take 40 mg by mouth daily. 01/27/14  Yes Chipper Herb, MD  vitamin C (ASCORBIC ACID) 500 MG tablet Take 500 mg by mouth daily.   Yes Historical Provider, MD  ibuprofen (ADVIL,MOTRIN) 800 MG tablet Take 1 tablet (800 mg total) by mouth every 8 (eight) hours as needed for mild pain or moderate pain. 04/10/14   [name] Balls, MD  vardenafil (LEVITRA) 20 MG tablet Take 20 mg by mouth daily as needed.      Historical Provider, MD   BP 115/71  Pulse 49  Temp(Src) 98.7 F (37.1 C) (Oral)  Resp 13  Ht 5\' 6"  (1.676 m)  Wt 260 lb (117.935 kg)  BMI 41.99 kg/m2  SpO2 96% Physical Exam  Nursing note and vitals reviewed. Constitutional: He is oriented to person, place, and time. Vital signs are normal. He appears well-developed and well-nourished.  Non-toxic appearance. He does not appear ill. No distress.  HENT:  Head: Normocephalic and atraumatic.  Nose: Nose normal.  Mouth/Throat: Oropharynx is clear and moist. No oropharyngeal exudate.  Eyes: Conjunctivae and EOM are normal. Pupils are equal, round, and reactive to light. No scleral icterus.  Neck: Normal range of motion. Neck supple. No tracheal deviation, no edema, no erythema and normal range of motion present. No mass and no thyromegaly present.  Cardiovascular: Normal rate, regular rhythm, S1 normal, S2 normal, normal heart sounds, intact distal pulses and normal pulses.   Exam reveals no gallop and no friction rub.   No murmur heard. Pulses:      Radial pulses are 2+ on the right side, and 2+ on the left side.       Dorsalis pedis pulses are 2+ on the right side, and 2+ on the left side.  Pulmonary/Chest: Effort normal and breath sounds normal. No respiratory distress. He has no wheezes. He has no rhonchi. He has no rales.  Abdominal: Soft. Normal appearance and bowel sounds are normal. He exhibits no distension, no ascites and no mass. There is no hepatosplenomegaly. There is no tenderness. There is no rebound, no guarding and no CVA tenderness.  R CVA tenderness  Musculoskeletal: Normal range of motion. He exhibits no edema and no tenderness.  Lymphadenopathy:    He has no cervical adenopathy.  Neurological: He is alert and oriented to person, place, and time. He has normal strength. No cranial nerve deficit or sensory deficit. GCS eye subscore is 4. GCS verbal subscore is 5. GCS motor subscore is 6.  Skin: Skin is warm, dry and intact. No petechiae and no rash noted. He is not diaphoretic. No erythema. No pallor.  Psychiatric: He has a normal mood and affect. His behavior is normal. Judgment normal.    ED Course  Procedures (including critical care time) Labs Review Labs Reviewed  URINALYSIS, ROUTINE W REFLEX MICROSCOPIC - Abnormal; Notable for the following:    Color, Urine RED (*)    APPearance CLOUDY (*)    Hgb urine dipstick LARGE (*)    Protein, ur 100 (*)    Leukocytes, UA SMALL (*)    All other components within normal limits  COMPREHENSIVE METABOLIC PANEL - Abnormal; Notable for the following:    Glucose, Bld 135 (*)    Albumin 3.1 (*)    Total Bilirubin <0.2 (*)    GFR calc non Af Amer 67 (*)    GFR calc Af Amer 78 (*)    All other components within normal limits  URINE MICROSCOPIC-ADD ON - Abnormal; Notable for the following:    Bacteria, UA FEW (*)    All other components within normal limits    Imaging Review Ct Renal Stone  Study  04/10/2014   CLINICAL DATA:  Right flank pain and hematuria.  EXAM: CT ABDOMEN AND PELVIS WITHOUT CONTRAST  TECHNIQUE: Multidetector CT imaging of the abdomen and pelvis was performed following the standard protocol without IV contrast.  COMPARISON:  Abdominal radiograph from 04/05/2014  FINDINGS: The visualized lung bases are clear. Minimal coronary artery calcification is noted.  The liver and spleen are unremarkable in appearance. The gallbladder is within normal limits. The pancreas and  adrenal glands are unremarkable.  Relatively severe right-sided hydronephrosis is noted, with mildly asymmetric right-sided perinephric stranding and fluid. There is diffuse dilatation of the right ureter along its entire course, with a 4 mm stone noted on the right side of the base of bladder, either obstructing or recently passed.  Less prominent left-sided perinephric stranding is seen. A nonobstructing 5 mm stone is noted at the interpole region of the right kidney, and a smaller nonobstructing stone is noted at the upper pole of the right kidney.  No free fluid is identified. The small bowel is unremarkable in appearance. The stomach is within normal limits. No acute vascular abnormalities are seen.  The patient is status post appendectomy. The colon is unremarkable in appearance.  The bladder is mildly distended and otherwise unremarkable. The prostate remains borderline normal in size. No inguinal lymphadenopathy is seen.  No acute osseous abnormalities are identified. Mild vacuum phenomenon is noted along the lower lumbar spine, with underlying facet disease.  IMPRESSION: 1. Relatively severe right-sided hydronephrosis, with diffuse dilatation of the right ureter. 4 mm stone noted on the right side of the base of the bladder, either obstructing or recently passed. 2. Nonobstructing right renal stones noted. 3. Minimal coronary artery calcification seen.   Electronically Signed   By: Garald Balding M.D.   On:  04/10/2014 01:07     EKG Interpretation None      MDM   Final diagnoses:  Nephrolithiasis    PAtient seen in the ED for [facility] PAIN.  CT as above reveals an obstructing kidney stone with R sided hydro.  PAtient symptoms resolved upon my repeat assessment with no pain medication at all , as the patient refused it.  He states he virtually has no pain now.  I believe the stone has passed or is passing.  PAtient told to follow up with urology this week for continued care.  Given pain medication Rx for home.  Return precautions given.  Episodes of bradycardia were due to the patient sleeping, otherwise, vitals have been at his baseline and he is safe for discharge.  [name] Balls, MD 04/10/14 1623

## 2014-04-10 NOTE — ED Notes (Signed)
Pt c/o bilat flank pain, mostly to the rt x1 month - pt admits today experiencing hematuria as well.

## 2014-04-10 NOTE — ED Notes (Signed)
Pt ambulating independently w/ steady gait on d/c in no acute distress, A&Ox4. D/c instructions reviewed w/ pt and family - pt and family deny any further questions or concerns at present. Rx given x1  

## 2014-07-01 ENCOUNTER — Encounter: Payer: Self-pay | Admitting: Family Medicine

## 2014-07-01 ENCOUNTER — Ambulatory Visit (INDEPENDENT_AMBULATORY_CARE_PROVIDER_SITE_OTHER): Payer: Commercial Managed Care - HMO | Admitting: Family Medicine

## 2014-07-01 VITALS — BP 113/67 | HR 54 | Temp 97.9°F | Ht 66.0 in | Wt 261.0 lb

## 2014-07-01 DIAGNOSIS — J302 Other seasonal allergic rhinitis: Secondary | ICD-10-CM

## 2014-07-01 DIAGNOSIS — K219 Gastro-esophageal reflux disease without esophagitis: Secondary | ICD-10-CM

## 2014-07-01 DIAGNOSIS — I10 Essential (primary) hypertension: Secondary | ICD-10-CM

## 2014-07-01 DIAGNOSIS — E785 Hyperlipidemia, unspecified: Secondary | ICD-10-CM

## 2014-07-01 DIAGNOSIS — E349 Endocrine disorder, unspecified: Secondary | ICD-10-CM

## 2014-07-01 DIAGNOSIS — E291 Testicular hypofunction: Secondary | ICD-10-CM

## 2014-07-01 DIAGNOSIS — M170 Bilateral primary osteoarthritis of knee: Secondary | ICD-10-CM

## 2014-07-01 DIAGNOSIS — E1169 Type 2 diabetes mellitus with other specified complication: Secondary | ICD-10-CM | POA: Insufficient documentation

## 2014-07-01 DIAGNOSIS — E559 Vitamin D deficiency, unspecified: Secondary | ICD-10-CM

## 2014-07-01 NOTE — Addendum Note (Signed)
Addended by: Zannie Cove on: 07/01/2014 11:09 AM   Modules accepted: Orders

## 2014-07-01 NOTE — Patient Instructions (Signed)
Continue to take current medications Always be careful and did not put yourself at risk for falling If you change your mind about taking the flu shot, please come back and we will give it to you Use Flonase 1-2 sprays each nostril for rhinitis Use saline nose spray during the day and Mucinex if needed for cough and congestion as directed and the handout We will call you with the results of the lab work as soon as those results become available Return the FOBT

## 2014-07-01 NOTE — Addendum Note (Signed)
Addended by: Earlene Plater on: 07/01/2014 01:06 PM   Modules accepted: Orders

## 2014-07-01 NOTE — Progress Notes (Signed)
Subjective:    Patient ID: [name] [name], male    DOB: 14-Mar-1946, 68 y.o.   MRN: [id number]  HPI Pt here today for follow up and management on chronic medical problems. The patient does complain of pain in both knees. He is due to return an FOBT and to get lab work today. He has had a Prevnar vaccine and is undecided about the flu vaccine. After talking with him or he prefers to not take the flu vaccine.         Patient Active Problem List   Diagnosis Date Noted  . Erectile dysfunction 02/28/2014  . Hypertension 12/14/2012  . Vitamin D deficiency 12/14/2012  . Testosterone deficiency   . Hyperlipidemia   . Sinus bradycardia   . GERD (gastroesophageal reflux disease)   . Colon polyp history   . BPH (benign prostatic hyperplasia)    Outpatient Encounter Prescriptions as of 07/01/2014  Medication Sig  . amLODipine (NORVASC) 5 MG tablet Take 5 mg by mouth daily.  Marland Kitchen aspirin 81 MG tablet Take 81 mg by mouth daily.    . benazepril (LOTENSIN) 10 MG tablet Take 10 mg by mouth daily.  . Cholecalciferol (VITAMIN D-3) 5000 UNITS TABS Take 1 tablet by mouth daily.    . Garlic 812 MG TABS Take 2 tablets by mouth daily.   . Omega-3 Fatty Acids (FISH OIL) 1000 MG CAPS Take 4 capsules by mouth daily.   Marland Kitchen omeprazole (PRILOSEC) 20 MG capsule Take 20 mg by mouth daily.  . pravastatin (PRAVACHOL) 40 MG tablet Take 40 mg by mouth daily.  . vitamin C (ASCORBIC ACID) 500 MG tablet Take 500 mg by mouth daily.  . NON FORMULARY Take 1 tablet by mouth daily. Equate allergy tablet  . vardenafil (LEVITRA) 20 MG tablet Take 20 mg by mouth daily as needed.    . [DISCONTINUED] ibuprofen (ADVIL,MOTRIN) 800 MG tablet Take 1 tablet (800 mg total) by mouth every 8 (eight) hours as needed for mild pain or moderate pain.    Review of Systems  Constitutional: Negative.   HENT: Negative.   Eyes: Negative.   Respiratory: Negative.   Cardiovascular: Negative.   Gastrointestinal: Negative.   Endocrine:  Negative.   Genitourinary: Negative.   Musculoskeletal: Positive for arthralgias.       Bilateral knee pain.  Skin: Negative.   Allergic/Immunologic: Negative.   Neurological: Negative.   Hematological: Negative.   Psychiatric/Behavioral: Negative.        Objective:   Physical Exam  Constitutional: He is oriented to person, place, and time. He appears well-developed and well-nourished. No distress.  The patient comes to the visit today with his wife. He is pleasant, quiet and alert. His biggest complaint are his knees left greater than right.  HENT:  Head: Normocephalic and atraumatic.  Right Ear: External ear normal.  Left Ear: External ear normal.  Mouth/Throat: Oropharynx is clear and moist. No oropharyngeal exudate.  His nasal congestion bilaterally.  Eyes: Conjunctivae and EOM are normal. Pupils are equal, round, and reactive to light. Right eye exhibits no discharge. Left eye exhibits no discharge. No scleral icterus.  Neck: Normal range of motion. Neck supple. No thyromegaly present.  There are no carotid bruits or anterior cervical nodes.  Cardiovascular: Normal rate, regular rhythm, normal heart sounds and intact distal pulses.  Exam reveals no gallop and no friction rub.   No murmur heard. The heart has a regular rate and rhythm at 60/m  Pulmonary/Chest: Effort normal and breath  sounds normal. No respiratory distress. He has no wheezes. He has no rales. He exhibits no tenderness.  There is no axillary adenopathy  Abdominal: Soft. Bowel sounds are normal. He exhibits no mass. There is no tenderness. There is no rebound and no guarding.  The abdomen is obese without masses tenderness or inguinal adenopathy  Musculoskeletal: Normal range of motion. He exhibits no edema or tenderness.  The patient does have bilateral knee pain and stiffness.  Lymphadenopathy:    He has no cervical adenopathy.  Neurological: He is alert and oriented to person, place, and time. He has normal  reflexes. No cranial nerve deficit.  Skin: Skin is warm and dry. No rash noted. No erythema. No pallor.  Psychiatric: He has a normal mood and affect. His behavior is normal. Judgment and thought content normal.  Nursing note and vitals reviewed.  BP 113/67 mmHg  Pulse 54  Temp(Src) 97.9 F (36.6 C) (Oral)  Ht 5\' 6"  (1.676 m)  Wt 261 lb (118.389 kg)  BMI 42.15 kg/m2         Assessment & Plan:  1. Gastroesophageal reflux disease without esophagitis  2. Essential hypertension  3. Hyperlipidemia  4. Vitamin D deficiency  5. Testosterone deficiency  6. Other seasonal allergic rhinitis  7. Primary osteoarthritis of both knees  Patient Instructions  Continue to take current medications Always be careful and did not put yourself at risk for falling If you change your mind about taking the flu shot, please come back and we will give it to you Use Flonase 1-2 sprays each nostril for rhinitis Use saline nose spray during the day and Mucinex if needed for cough and congestion as directed and the handout We will call you with the results of the lab work as soon as those results become available Return the FOBT    Arrie Senate MD

## 2014-07-02 LAB — BMP8+EGFR
BUN/Creatinine Ratio: 16 (ref 10–22)
BUN: 15 mg/dL (ref 8–27)
CHLORIDE: 100 mmol/L (ref 97–108)
CO2: 27 mmol/L (ref 18–29)
CREATININE: 0.95 mg/dL (ref 0.76–1.27)
Calcium: 9.6 mg/dL (ref 8.6–10.2)
GFR calc non Af Amer: 81 mL/min/{1.73_m2} (ref >59)
GFR, EST AFRICAN AMERICAN: 94 mL/min/{1.73_m2} (ref >59)
Glucose: 106 mg/dL — ABNORMAL HIGH (ref 65–99)
Potassium: 4.6 mmol/L (ref 3.5–5.2)
SODIUM: 140 mmol/L (ref 134–144)

## 2014-07-02 LAB — CBC WITH DIFFERENTIAL
BASOS ABS: 0 10*3/uL (ref 0.0–0.2)
Basos: 0 %
EOS ABS: 0.3 10*3/uL (ref 0.0–0.4)
Eos: 4 %
HCT: 42 % (ref 37.5–51.0)
Hemoglobin: 14.6 g/dL (ref 12.6–17.7)
IMMATURE GRANS (ABS): 0 10*3/uL (ref 0.0–0.1)
IMMATURE GRANULOCYTES: 0 %
Lymphocytes Absolute: 2.1 10*3/uL (ref 0.7–3.1)
Lymphs: 29 %
MCH: 32.4 pg (ref 26.6–33.0)
MCHC: 34.8 g/dL (ref 31.5–35.7)
MCV: 93 fL (ref 79–97)
MONOCYTES: 7 %
Monocytes Absolute: 0.5 10*3/uL (ref 0.1–0.9)
NEUTROS ABS: 4.2 10*3/uL (ref 1.4–7.0)
NEUTROS PCT: 60 %
PLATELETS: 198 10*3/uL (ref 150–379)
RBC: 4.5 x10E6/uL (ref 4.14–5.80)
RDW: 13.6 % (ref 12.3–15.4)
WBC: 7.2 10*3/uL (ref 3.4–10.8)

## 2014-07-02 LAB — NMR, LIPOPROFILE
Cholesterol: 146 mg/dL (ref 100–199)
HDL CHOLESTEROL BY NMR: 43 mg/dL (ref >39)
HDL PARTICLE NUMBER: 34.4 umol/L (ref >=30.5)
LDL Particle Number: 1099 nmol/L — ABNORMAL HIGH (ref <1000)
LDL SIZE: 20.3 nm (ref >20.5)
LDL-C: 70 mg/dL (ref 0–99)
LP-IR SCORE: 75 — AB (ref <=45)
SMALL LDL PARTICLE NUMBER: 654 nmol/L — AB (ref <=527)
Triglycerides by NMR: 165 mg/dL — ABNORMAL HIGH (ref 0–149)

## 2014-07-02 LAB — VITAMIN D 25 HYDROXY (VIT D DEFICIENCY, FRACTURES): Vit D, 25-Hydroxy: 69.1 ng/mL (ref 30.0–100.0)

## 2014-07-02 LAB — HEPATIC FUNCTION PANEL
ALBUMIN: 4 g/dL (ref 3.6–4.8)
ALK PHOS: 51 IU/L (ref 39–117)
ALT: 19 IU/L (ref 0–44)
AST: 19 IU/L (ref 0–40)
BILIRUBIN TOTAL: 0.5 mg/dL (ref 0.0–1.2)
Bilirubin, Direct: 0.13 mg/dL (ref 0.00–0.40)
Total Protein: 6.5 g/dL (ref 6.0–8.5)

## 2014-08-01 ENCOUNTER — Encounter: Payer: Self-pay | Admitting: *Deleted

## 2014-11-18 ENCOUNTER — Ambulatory Visit: Payer: Commercial Managed Care - HMO | Admitting: Family Medicine

## 2014-11-21 ENCOUNTER — Ambulatory Visit: Payer: Commercial Managed Care - HMO | Admitting: Family Medicine

## 2014-11-24 ENCOUNTER — Encounter: Payer: Self-pay | Admitting: Family Medicine

## 2014-11-24 ENCOUNTER — Ambulatory Visit (INDEPENDENT_AMBULATORY_CARE_PROVIDER_SITE_OTHER): Payer: Commercial Managed Care - HMO | Admitting: Family Medicine

## 2014-11-24 VITALS — BP 108/61 | HR 50 | Temp 97.0°F | Ht 66.0 in | Wt 263.0 lb

## 2014-11-24 DIAGNOSIS — E349 Endocrine disorder, unspecified: Secondary | ICD-10-CM

## 2014-11-24 DIAGNOSIS — E559 Vitamin D deficiency, unspecified: Secondary | ICD-10-CM

## 2014-11-24 DIAGNOSIS — E291 Testicular hypofunction: Secondary | ICD-10-CM

## 2014-11-24 DIAGNOSIS — N400 Enlarged prostate without lower urinary tract symptoms: Secondary | ICD-10-CM | POA: Diagnosis not present

## 2014-11-24 DIAGNOSIS — I10 Essential (primary) hypertension: Secondary | ICD-10-CM

## 2014-11-24 DIAGNOSIS — K219 Gastro-esophageal reflux disease without esophagitis: Secondary | ICD-10-CM

## 2014-11-24 DIAGNOSIS — E785 Hyperlipidemia, unspecified: Secondary | ICD-10-CM | POA: Diagnosis not present

## 2014-11-24 LAB — POCT CBC
Granulocyte percent: 58.1 %G (ref 37–80)
HCT, POC: 44.3 % (ref 43.5–53.7)
HEMOGLOBIN: 14.4 g/dL (ref 14.1–18.1)
Lymph, poc: 2.1 (ref 0.6–3.4)
MCH: 31.6 pg — AB (ref 27–31.2)
MCHC: 32.5 g/dL (ref 31.8–35.4)
MCV: 97.1 fL — AB (ref 80–97)
MPV: 6.7 fL (ref 0–99.8)
POC Granulocyte: 3.7 (ref 2–6.9)
POC LYMPH PERCENT: 33.5 %L (ref 10–50)
Platelet Count, POC: 207 10*3/uL (ref 142–424)
RBC: 4.57 M/uL — AB (ref 4.69–6.13)
RDW, POC: 13.3 %
WBC: 6.3 10*3/uL (ref 4.6–10.2)

## 2014-11-24 NOTE — Patient Instructions (Addendum)
Medicare Annual Wellness Visit  [facility] and the medical providers at [facility] strive to bring you the best medical care.  In doing so we not only want to address your current medical conditions and concerns but also to detect new conditions early and prevent illness, disease and health-related problems.    Medicare offers a yearly Wellness Visit which allows our clinical staff to assess your need for preventative services including immunizations, lifestyle education, counseling to decrease risk of preventable diseases and screening for fall risk and other medical concerns.    This visit is provided free of charge (no copay) for all Medicare recipients. The clinical pharmacists at [facility] have begun to conduct these Wellness Visits which will also include a thorough review of all your medications.    As you primary medical provider recommend that you make an appointment for your Annual Wellness Visit if you have not done so already this year.  You may set up this appointment before you leave today or you may call back ([phone]) and schedule an appointment.  Please make sure when you call that you mention that you are scheduling your Annual Wellness Visit with the clinical pharmacist so that the appointment may be made for the proper length of time.     Continue current medications. Continue good therapeutic lifestyle changes which include good diet and exercise. Fall precautions discussed with patient. If an FOBT was given today- please return it to our front desk. If you are over 49 years old - you may need Prevnar 15 or the adult Pneumonia vaccine.  Flu Shots are still available at our office. If you still haven't had one please call to set up a nurse visit to get one.   After your visit with [place] today you will receive a survey in the mail or online from [employer] regarding your care with [place]. Please take a moment to  fill this out. Your feedback is very important to [place] as you can help [place] better understand your patient needs as well as improve your experience and satisfaction. WE CARE ABOUT YOU!!!   Continue to work aggressively on your weight and drink more water and less drinks and get more exercise Check with insurance about the Tdap Return the FOBT

## 2014-11-24 NOTE — Progress Notes (Signed)
Subjective:    Patient ID: [name] [name], male    DOB: August 14, 1945, 69 y.o.   MRN: [id number]  HPI Pt here for follow up and management of chronic medical problems which includes hypertension and hyperlipidemia. He is taking medications regularly. The patient has no complaints. He is up-to-date on his health maintenance issues and he will get lab work today and return an FOBT. He will check the cost of his tetanus vaccine. The patient is doing well.      Patient Active Problem List   Diagnosis Date Noted  . Hyperlipidemia 07/01/2014  . Erectile dysfunction 02/28/2014  . Vitamin D deficiency 12/14/2012  . Testosterone deficiency   . Benign essential hypertension antepartum   . Sinus bradycardia   . GERD (gastroesophageal reflux disease)   . Colon polyp history   . BPH (benign prostatic hyperplasia)    Outpatient Encounter Prescriptions as of 11/24/2014  Medication Sig  . amLODipine (NORVASC) 5 MG tablet Take 5 mg by mouth daily.  Marland Kitchen aspirin 81 MG tablet Take 81 mg by mouth daily.    . benazepril (LOTENSIN) 10 MG tablet Take 10 mg by mouth daily.  . Cholecalciferol (VITAMIN D-3) 5000 UNITS TABS Take 1 tablet by mouth daily.    . Garlic 937 MG TABS Take 2 tablets by mouth daily.   . NON FORMULARY Take 1 tablet by mouth daily. Equate allergy tablet  . Omega-3 Fatty Acids (FISH OIL) 1000 MG CAPS Take 4 capsules by mouth daily.   Marland Kitchen omeprazole (PRILOSEC) 20 MG capsule Take 20 mg by mouth daily.  . pravastatin (PRAVACHOL) 40 MG tablet Take 40 mg by mouth daily.  . vitamin C (ASCORBIC ACID) 500 MG tablet Take 500 mg by mouth daily.  . vardenafil (LEVITRA) 20 MG tablet Take 20 mg by mouth daily as needed.       Review of Systems  Constitutional: Negative.   HENT: Negative.   Eyes: Negative.   Respiratory: Negative.   Cardiovascular: Negative.   Gastrointestinal: Negative.   Endocrine: Negative.   Genitourinary: Negative.   Musculoskeletal: Negative.   Skin: Negative.     Allergic/Immunologic: Negative.   Neurological: Negative.   Hematological: Negative.   Psychiatric/Behavioral: Negative.        Objective:   Physical Exam  Constitutional: He is oriented to person, place, and time. He appears well-developed and well-nourished. No distress.  Pleasant and alert  HENT:  Head: Normocephalic and atraumatic.  Right Ear: External ear normal.  Left Ear: External ear normal.  Nose: Nose normal.  Mouth/Throat: Oropharynx is clear and moist. No oropharyngeal exudate.  Eyes: Conjunctivae and EOM are normal. Pupils are equal, round, and reactive to light. Right eye exhibits no discharge. Left eye exhibits no discharge. No scleral icterus.  Neck: Normal range of motion. Neck supple. No thyromegaly present.  No bruits or anterior cervical adenopathy  Cardiovascular: Normal rate, regular rhythm, normal heart sounds and intact distal pulses.   No murmur heard. At 60/m  Pulmonary/Chest: Effort normal and breath sounds normal. No respiratory distress. He has no wheezes. He has no rales. He exhibits no tenderness.  No axillary adenopathy  Abdominal: Soft. Bowel sounds are normal. He exhibits no mass. There is no tenderness. There is no rebound and no guarding.  There is abdominal obesity without masses tenderness or organ enlargement  Musculoskeletal: Normal range of motion. He exhibits no edema or tenderness.  Lymphadenopathy:    He has no cervical adenopathy.  Neurological: He is alert  and oriented to person, place, and time. He has normal reflexes. No cranial nerve deficit.  Skin: Skin is warm and dry. No rash noted. No erythema. No pallor.  Toenail fungus  Psychiatric: He has a normal mood and affect. His behavior is normal. Judgment and thought content normal.  Nursing note and vitals reviewed.  BP 108/61 mmHg  Pulse 50  Temp(Src) 97 F (36.1 C) (Oral)  Ht _0  (1.676 m)  Wt 263 lb (119.296 kg)  BMI 42.47 kg/m2        Assessment & Plan:  1.  Gastroesophageal reflux disease without esophagitis -The patient is doing well with this and having no problems with taking omeprazole. - POCT CBC - Hepatic function panel  2. Essential hypertension -His blood pressure is under good control and he should continue with current treatment - POCT CBC - BMP8+EGFR - Hepatic function panel  3. Hyperlipidemia -He will have his lab work done today and any change in treatment will be determined by the results that are returned. - POCT CBC - NMR, lipoprofile  4. Vitamin D deficiency -He should continue with current vitamin D treatment pending results of lab work - POCT CBC - Vit D  25 hydroxy (rtn osteoporosis monitoring)  5. Testosterone deficiency -The patient has a history of testosterone deficiency and does have some erectile dysfunction but is not taking any supplements for this other than Levitra. - POCT CBC  6. BPH (benign prostatic hyperplasia) -He is having no symptoms with voiding. - POCT CBC  Patient Instructions                       Medicare Annual Wellness Visit  [facility] and the medical providers at [facility] strive to bring you the best medical care.  In doing so we not only want to address your current medical conditions and concerns but also to detect new conditions early and prevent illness, disease and health-related problems.    Medicare offers a yearly Wellness Visit which allows our clinical staff to assess your need for preventative services including immunizations, lifestyle education, counseling to decrease risk of preventable diseases and screening for fall risk and other medical concerns.    This visit is provided free of charge (no copay) for all Medicare recipients. The clinical pharmacists at [facility] have begun to conduct these Wellness Visits which will also include a thorough review of all your medications.    As you primary medical provider recommend  that you make an appointment for your Annual Wellness Visit if you have not done so already this year.  You may set up this appointment before you leave today or you may call back ([phone]) and schedule an appointment.  Please make sure when you call that you mention that you are scheduling your Annual Wellness Visit with the clinical pharmacist so that the appointment may be made for the proper length of time.     Continue current medications. Continue good therapeutic lifestyle changes which include good diet and exercise. Fall precautions discussed with patient. If an FOBT was given today- please return it to our front desk. If you are over 65 years old - you may need Prevnar 28 or the adult Pneumonia vaccine.  Flu Shots are still available at our office. If you still haven't had one please call to set up a nurse visit to get one.   After your visit with [place] today you will receive a  survey in the mail or online from [employer] regarding your care with [place]. Please take a moment to fill this out. Your feedback is very important to [place] as you can help [place] better understand your patient needs as well as improve your experience and satisfaction. WE CARE ABOUT YOU!!!   Continue to work aggressively on your weight and drink more water and less drinks and get more exercise Check with insurance about the Tdap Return the FOBT   Arrie Senate MD

## 2014-11-25 ENCOUNTER — Telehealth: Payer: Self-pay | Admitting: *Deleted

## 2014-11-25 LAB — HEPATIC FUNCTION PANEL
ALBUMIN: 3.9 g/dL (ref 3.5–4.8)
ALK PHOS: 53 IU/L (ref 39–117)
ALT: 19 IU/L (ref 0–44)
AST: 23 IU/L (ref 0–40)
BILIRUBIN, DIRECT: 0.2 mg/dL (ref 0.00–0.40)
Bilirubin Total: 0.7 mg/dL (ref 0.0–1.2)
TOTAL PROTEIN: 6.2 g/dL (ref 6.0–8.5)

## 2014-11-25 LAB — NMR, LIPOPROFILE
CHOLESTEROL: 179 mg/dL (ref 100–199)
HDL Cholesterol by NMR: 47 mg/dL (ref >39)
HDL Particle Number: 37 umol/L (ref >=30.5)
LDL Particle Number: 1376 nmol/L — ABNORMAL HIGH (ref <1000)
LDL Size: 20 nm (ref >20.5)
LDL-C: 86 mg/dL (ref 0–99)
LP-IR Score: 79 — ABNORMAL HIGH (ref <=45)
Small LDL Particle Number: 904 nmol/L — ABNORMAL HIGH (ref <=527)
TRIGLYCERIDES BY NMR: 232 mg/dL — AB (ref 0–149)

## 2014-11-25 LAB — BMP8+EGFR
BUN/Creatinine Ratio: 15 (ref 10–22)
BUN: 13 mg/dL (ref 8–27)
CO2: 26 mmol/L (ref 18–29)
CREATININE: 0.84 mg/dL (ref 0.76–1.27)
Calcium: 9.4 mg/dL (ref 8.6–10.2)
Chloride: 100 mmol/L (ref 97–108)
GFR calc Af Amer: 103 mL/min/{1.73_m2} (ref >59)
GFR calc non Af Amer: 89 mL/min/{1.73_m2} (ref >59)
Glucose: 110 mg/dL — ABNORMAL HIGH (ref 65–99)
Potassium: 4.6 mmol/L (ref 3.5–5.2)
SODIUM: 140 mmol/L (ref 134–144)

## 2014-11-25 LAB — VITAMIN D 25 HYDROXY (VIT D DEFICIENCY, FRACTURES): VIT D 25 HYDROXY: 51.9 ng/mL (ref 30.0–100.0)

## 2014-11-25 NOTE — Telephone Encounter (Signed)
-----   Message from Chipper Herb, MD sent at 11/25/2014  7:34 AM EDT ----- The blood sugar is slightly elevated at 110. The creatinine, the most important kidney function test is within normal limits. The electrolytes including potassium are good. All liver function tests are within normal limits Cholesterol numbers with advanced lipid testing are higher than they have been over the past year. The total LDL particle number is 1376 previously this was 1099. The LDL C is good at 86. The triglycerides are elevated more than they have been over the past year at 232 and this number should be less than 150. The good cholesterol or the HDL particle number is good.------- please have the patient to try to do better with diet and exercise. Have him increase his pravastatin to 80 mg daily and call in a new prescription for pravastatin 80 mg 1 daily+++++++ because of increasing the pravastatin and we should recheck liver function tests in 4 weeks and he does not need an appointment for that. He should just come by and get the liver function tests sometime during the day at his convenience. The vitamin D level is good at 51.9 and he should continue his current dose of vitamin D

## 2014-11-30 ENCOUNTER — Other Ambulatory Visit: Payer: Self-pay | Admitting: Family Medicine

## 2014-12-01 ENCOUNTER — Telehealth: Payer: Self-pay | Admitting: Family Medicine

## 2014-12-02 ENCOUNTER — Telehealth: Payer: Self-pay | Admitting: Family Medicine

## 2014-12-02 NOTE — Telephone Encounter (Signed)
Informed pt he would have to call and have them fax [place] requests

## 2015-04-11 ENCOUNTER — Ambulatory Visit (INDEPENDENT_AMBULATORY_CARE_PROVIDER_SITE_OTHER): Payer: Commercial Managed Care - HMO

## 2015-04-11 ENCOUNTER — Encounter: Payer: Self-pay | Admitting: Family Medicine

## 2015-04-11 ENCOUNTER — Ambulatory Visit (INDEPENDENT_AMBULATORY_CARE_PROVIDER_SITE_OTHER): Payer: Commercial Managed Care - HMO | Admitting: Family Medicine

## 2015-04-11 ENCOUNTER — Encounter (INDEPENDENT_AMBULATORY_CARE_PROVIDER_SITE_OTHER): Payer: Self-pay

## 2015-04-11 VITALS — BP 143/84 | HR 49 | Temp 97.8°F | Ht 66.0 in | Wt 256.0 lb

## 2015-04-11 DIAGNOSIS — E559 Vitamin D deficiency, unspecified: Secondary | ICD-10-CM | POA: Diagnosis not present

## 2015-04-11 DIAGNOSIS — I10 Essential (primary) hypertension: Secondary | ICD-10-CM | POA: Diagnosis not present

## 2015-04-11 DIAGNOSIS — M25561 Pain in right knee: Secondary | ICD-10-CM

## 2015-04-11 DIAGNOSIS — E785 Hyperlipidemia, unspecified: Secondary | ICD-10-CM

## 2015-04-11 DIAGNOSIS — N400 Enlarged prostate without lower urinary tract symptoms: Secondary | ICD-10-CM | POA: Diagnosis not present

## 2015-04-11 DIAGNOSIS — M25551 Pain in right hip: Secondary | ICD-10-CM

## 2015-04-11 DIAGNOSIS — E349 Endocrine disorder, unspecified: Secondary | ICD-10-CM

## 2015-04-11 DIAGNOSIS — N5201 Erectile dysfunction due to arterial insufficiency: Secondary | ICD-10-CM

## 2015-04-11 DIAGNOSIS — K219 Gastro-esophageal reflux disease without esophagitis: Secondary | ICD-10-CM

## 2015-04-11 DIAGNOSIS — M25562 Pain in left knee: Secondary | ICD-10-CM

## 2015-04-11 DIAGNOSIS — E291 Testicular hypofunction: Secondary | ICD-10-CM | POA: Diagnosis not present

## 2015-04-11 LAB — POCT URINALYSIS DIPSTICK
BILIRUBIN UA: NEGATIVE
GLUCOSE UA: NEGATIVE
KETONES UA: NEGATIVE
Leukocytes, UA: NEGATIVE
Nitrite, UA: NEGATIVE
PH UA: 6
Protein, UA: NEGATIVE
RBC UA: NEGATIVE
SPEC GRAV UA: 1.02
Urobilinogen, UA: NEGATIVE

## 2015-04-11 LAB — POCT UA - MICROSCOPIC ONLY
Bacteria, U Microscopic: NEGATIVE
CRYSTALS, UR, HPF, POC: NEGATIVE
Casts, Ur, LPF, POC: NEGATIVE
EPITHELIAL CELLS, URINE PER MICROSCOPY: NEGATIVE
YEAST UA: NEGATIVE

## 2015-04-11 MED ORDER — BENAZEPRIL HCL 10 MG PO TABS: 10.0000 mg | ORAL_TABLET | Freq: Every day | ORAL | Status: DC

## 2015-04-11 MED ORDER — PRAVASTATIN SODIUM 40 MG PO TABS: 40.0000 mg | ORAL_TABLET | Freq: Every day | ORAL | Status: DC

## 2015-04-11 MED ORDER — AMLODIPINE BESYLATE 5 MG PO TABS: 5.0000 mg | ORAL_TABLET | Freq: Every day | ORAL | Status: DC

## 2015-04-11 NOTE — Progress Notes (Signed)
Subjective:    Patient ID: [name] [name], male    DOB: May 27, 1946, 69 y.o.   MRN: [id number]  HPI Pt here for follow up and management of chronic medical problems which includes hypertension and hyperlipidemia. He is taking medications regularly and he is accompanied today by his wife. The patient today complains of right hip pain and bilateral knee pain. He is also in need of refilling several medications. He is due to get a chest x-ray today. Is also due for lab work and to receive an FOBT to return. The patient denies chest pain shortness of breath trouble swallowing and heartburn indigestion nausea vomiting diarrhea and blood in the stool. The PPI that he takes controls his heartburn. Is also not having problems with passing his water.       Patient Active Problem List   Diagnosis Date Noted  . Hyperlipidemia 07/01/2014  . Erectile dysfunction 02/28/2014  . Vitamin D deficiency 12/14/2012  . Testosterone deficiency   . Benign essential hypertension antepartum   . Sinus bradycardia   . GERD (gastroesophageal reflux disease)   . Colon polyp history   . BPH (benign prostatic hyperplasia)    Outpatient Encounter Prescriptions as of 04/11/2015  Medication Sig  . amLODipine (NORVASC) 5 MG tablet TAKE 1 TABLET EVERY DAY  . aspirin 81 MG tablet Take 81 mg by mouth daily.    . benazepril (LOTENSIN) 10 MG tablet TAKE 1 TABLET EVERY DAY  . Cholecalciferol (VITAMIN D-3) 5000 UNITS TABS Take 1 tablet by mouth daily.    . Garlic 3953 MG CAPS Take 2 capsules by mouth daily.  . NON FORMULARY Take 1 tablet by mouth daily. Equate allergy tablet  . Omega-3 Fatty Acids (FISH OIL) 1000 MG CAPS Take 4 capsules by mouth daily.   Marland Kitchen omeprazole (PRILOSEC) 20 MG capsule Take 20 mg by mouth daily.  . pravastatin (PRAVACHOL) 40 MG tablet TAKE 1 TABLET EVERY DAY  . vardenafil (LEVITRA) 20 MG tablet Take 20 mg by mouth daily as needed.    . vitamin C (ASCORBIC ACID) 500 MG tablet Take 500 mg by mouth  daily.  . [DISCONTINUED] Garlic 202 MG TABS Take 1 tablet by mouth daily.    No facility-administered encounter medications on file as of 04/11/2015.     Review of Systems  Constitutional: Negative.   HENT: Negative.   Eyes: Negative.   Respiratory: Negative.   Cardiovascular: Negative.   Gastrointestinal: Negative.   Endocrine: Negative.   Genitourinary: Negative.   Musculoskeletal: Positive for arthralgias (right hip and bilateral knee pain).  Skin: Negative.   Allergic/Immunologic: Negative.   Neurological: Negative.   Hematological: Negative.   Psychiatric/Behavioral: Negative.        Objective:   Physical Exam  Constitutional: He is oriented to person, place, and time. He appears well-developed and well-nourished.  The patient is alert and pleasant.  HENT:  Head: Normocephalic and atraumatic.  Right Ear: External ear normal.  Left Ear: External ear normal.  Mouth/Throat: Oropharynx is clear and moist. No oropharyngeal exudate.  There is bilateral nasal congestion  Eyes: Conjunctivae and EOM are normal. Pupils are equal, round, and reactive to light. Right eye exhibits no discharge. Left eye exhibits no discharge. No scleral icterus.  Neck: Normal range of motion. Neck supple. No thyromegaly present.  There are no carotid bruits anterior cervical adenopathy or thyromegaly.  Cardiovascular: Normal rate, regular rhythm, normal heart sounds and intact distal pulses.  Exam reveals no gallop and no friction  rub.   No murmur heard. The pulses in the feet were palpable but difficult to find. The heart has a regular rate and rhythm at 60/m  Pulmonary/Chest: Effort normal and breath sounds normal. No respiratory distress. He has no wheezes. He has no rales. He exhibits no tenderness.  Clear anteriorly and posteriorly  Abdominal: Soft. Bowel sounds are normal. He exhibits no mass. There is no tenderness. There is no rebound and no guarding.  Abdominal obesity without liver or  spleen enlargement, no masses and no tenderness.  Genitourinary: Rectum normal and penis normal.  The prostate was minimally enlarged and there were no rectal masses. The external genitalia were within normal limits and there were no inguinal hernias or inguinal nodes palpable  Musculoskeletal: Normal range of motion. He exhibits no edema or tenderness.  With leg raising on the right there was some pain in the right hip. Otherwise no hip pain was reproducible. Both knees were flexed and extended without pain and there is no joint line tenderness.  Lymphadenopathy:    He has no cervical adenopathy.  Neurological: He is alert and oriented to person, place, and time. He has normal reflexes. No cranial nerve deficit.  Skin: Skin is warm and dry. No rash noted.  Psychiatric: He has a normal mood and affect. His behavior is normal. Judgment and thought content normal.  Nursing note and vitals reviewed.  BP 143/84 mmHg  Pulse 49  Temp(Src) 97.8 F (36.6 C) (Oral)  Ht '5\' 6"'  (1.676 m)  Wt 256 lb (116.121 kg)  BMI 41.34 kg/m2  [facility] reading (PRIMARY) by  Dr. Brunilda Payor x-ray, bilateral knees, and right hip.---  Knees, bone-on-bone and degenerative changes, hips degenerative changes, chest x-ray borderline cardiac enlargement otherwise no active disease                                       Assessment & Plan:  1. Gastroesophageal reflux disease without esophagitis -As always the patient is taking his omeprazole he has no problems with heartburn or indigestion. - CBC with Differential/Platelet  2. Essential hypertension -The blood pressure slightly elevated today and he will continue to watch his sodium intake and lower his weight as much as possible. There will be no changes made with the blood pressure medicines today. - BMP8+EGFR - CBC with Differential/Platelet - Hepatic function panel - DG Chest 2 View; Future  3. Hyperlipidemia -He will continue with current treatment pending results  of lab work today. - CBC with Differential/Platelet - NMR, lipoprofile  4. Testosterone deficiency -The patient has testosterone deficiency but does not take anything for this. He is currently not using his Levitra because of the expense. He was given samples of Viagra to try and some information regarding [facility] he can buy generic Viagra. - CBC with Differential/Platelet - POCT UA - Microscopic Only - POCT urinalysis dipstick - PSA  5. Vitamin D deficiency -He should continue his vitamin D replacement pending results of lab work - CBC with Differential/Platelet - Vit D  25 hydroxy (rtn osteoporosis monitoring)  6. BPH (benign prostatic hyperplasia) -There is minimal enlargement of the prostate and he is having no problems with passing his water. - CBC with Differential/Platelet - POCT UA - Microscopic Only - POCT urinalysis dipstick - PSA  7. Bilateral knee pain -The knee pain is most likely osteoarthritic based on his symptoms and we will follow-up with him  with x-rays once that information his back. - DG Knee 1-2 Views Left; Future - DG Knee 1-2 Views Right; Future  8. Right hip pain -Possibly scheduled visit with orthopedic surgeon if patient decides for this. - DG HIP UNILAT W OR W/O PELVIS 2-3 VIEWS RIGHT; Future  9. Erectile dysfunction due to arterial insufficiency -Samples were given of Viagra but upon looking at his allergies he may be symptomatic or have problems with Viagra and we will remind him of that.  Meds ordered this encounter  Medications  . Garlic 7494 MG CAPS    Sig: Take 2 capsules by mouth daily.  Marland Kitchen amLODipine (NORVASC) 5 MG tablet    Sig: Take 1 tablet (5 mg total) by mouth daily.    Dispense:  90 tablet    Refill:  3  . benazepril (LOTENSIN) 10 MG tablet    Sig: Take 1 tablet (10 mg total) by mouth daily.    Dispense:  90 tablet    Refill:  3  . pravastatin (PRAVACHOL) 40 MG tablet    Sig: Take 1 tablet (40 mg total) by mouth daily.      Dispense:  90 tablet    Refill:  3   Patient Instructions                       Medicare Annual Wellness Visit  [facility] and the medical providers at [facility] strive to bring you the best medical care.  In doing so we not only want to address your current medical conditions and concerns but also to detect new conditions early and prevent illness, disease and health-related problems.    Medicare offers a yearly Wellness Visit which allows our clinical staff to assess your need for preventative services including immunizations, lifestyle education, counseling to decrease risk of preventable diseases and screening for fall risk and other medical concerns.    This visit is provided free of charge (no copay) for all Medicare recipients. The clinical pharmacists at [facility] have begun to conduct these Wellness Visits which will also include a thorough review of all your medications.    As you primary medical provider recommend that you make an appointment for your Annual Wellness Visit if you have not done so already this year.  You may set up this appointment before you leave today or you may call back ([phone]) and schedule an appointment.  Please make sure when you call that you mention that you are scheduling your Annual Wellness Visit with the clinical pharmacist so that the appointment may be made for the proper length of time.     Continue current medications. Continue good therapeutic lifestyle changes which include good diet and exercise. Fall precautions discussed with patient. If an FOBT was given today- please return it to our front desk. If you are over 46 years old - you may need Prevnar 6 or the adult Pneumonia vaccine.  **Flu shots will be available soon--- please call and schedule a FLU-CLINIC appointment**  After your visit with [place] today you will receive a survey in the mail or online from [employer] regarding your care  with [place]. Please take a moment to fill this out. Your feedback is very important to [place] as you can help [place] better understand your patient needs as well as improve your experience and satisfaction. WE CARE ABOUT YOU!!!   **Please join [place] SEPT.22, 2016 from 5:00 to 7:00pm for our OPEN  HOUSE! Come out and meet our NEW providers** You're given samples of Viagra 50 mg to try for erectile dysfunction. If this helps you may want to consider [facility] and using a prescription there because it would be much cheaper for the Viagra generic. Also because of your knee pain and hip pain you may want to consider seeing orthopedic surgeon comes to this office from [facility], if you decide to do that please call back and we will make arrangements for you to see one of the orthopedic surgeons and possibly receive injections in your knee and hip. Continue to work on weight reduction as much as possible and watching her diet as closely as possible   Arrie Senate MD

## 2015-04-11 NOTE — Patient Instructions (Addendum)
Medicare Annual Wellness Visit  [facility] and the medical providers at [facility] strive to bring you the best medical care.  In doing so we not only want to address your current medical conditions and concerns but also to detect new conditions early and prevent illness, disease and health-related problems.    Medicare offers a yearly Wellness Visit which allows our clinical staff to assess your need for preventative services including immunizations, lifestyle education, counseling to decrease risk of preventable diseases and screening for fall risk and other medical concerns.    This visit is provided free of charge (no copay) for all Medicare recipients. The clinical pharmacists at [facility] have begun to conduct these Wellness Visits which will also include a thorough review of all your medications.    As you primary medical provider recommend that you make an appointment for your Annual Wellness Visit if you have not done so already this year.  You may set up this appointment before you leave today or you may call back ([phone]) and schedule an appointment.  Please make sure when you call that you mention that you are scheduling your Annual Wellness Visit with the clinical pharmacist so that the appointment may be made for the proper length of time.     Continue current medications. Continue good therapeutic lifestyle changes which include good diet and exercise. Fall precautions discussed with patient. If an FOBT was given today- please return it to our front desk. If you are over 47 years old - you may need Prevnar 81 or the adult Pneumonia vaccine.  **Flu shots will be available soon--- please call and schedule a FLU-CLINIC appointment**  After your visit with [place] today you will receive a survey in the mail or online from [employer] regarding your care with [place]. Please take a moment to fill this out. Your feedback is  very important to [place] as you can help [place] better understand your patient needs as well as improve your experience and satisfaction. WE CARE ABOUT YOU!!!   **Please join [place] SEPT.[email or web address], 2016 from 5:00 to 7:00pm for our OPEN HOUSE! Come out and meet our NEW providers** You're given samples of Viagra 50 mg to try for erectile dysfunction. If this helps you may want to consider [facility] and using a prescription there because it would be much cheaper for the Viagra generic. Also because of your knee pain and hip pain you may want to consider seeing orthopedic surgeon comes to this office from [facility], if you decide to do that please call back and we will make arrangements for you to see one of the orthopedic surgeons and possibly receive injections in your knee and hip. Continue to work on weight reduction as much as possible and watching her diet as closely as possible

## 2015-04-12 LAB — CBC WITH DIFFERENTIAL/PLATELET
Basophils Absolute: 0 10*3/uL (ref 0.0–0.2)
Basos: 1 %
EOS (ABSOLUTE): 0.2 10*3/uL (ref 0.0–0.4)
EOS: 4 %
HEMATOCRIT: 41.6 % (ref 37.5–51.0)
Hemoglobin: 14.4 g/dL (ref 12.6–17.7)
IMMATURE GRANULOCYTES: 0 %
Immature Grans (Abs): 0 10*3/uL (ref 0.0–0.1)
Lymphocytes Absolute: 1.9 10*3/uL (ref 0.7–3.1)
Lymphs: 32 %
MCH: 33.8 pg — ABNORMAL HIGH (ref 26.6–33.0)
MCHC: 34.6 g/dL (ref 31.5–35.7)
MCV: 98 fL — ABNORMAL HIGH (ref 79–97)
MONOCYTES: 7 %
MONOS ABS: 0.4 10*3/uL (ref 0.1–0.9)
NEUTROS PCT: 56 %
Neutrophils Absolute: 3.4 10*3/uL (ref 1.4–7.0)
Platelets: 188 10*3/uL (ref 150–379)
RBC: 4.26 x10E6/uL (ref 4.14–5.80)
RDW: 13.4 % (ref 12.3–15.4)
WBC: 6 10*3/uL (ref 3.4–10.8)

## 2015-04-12 LAB — NMR, LIPOPROFILE
Cholesterol: 154 mg/dL (ref 100–199)
HDL Cholesterol by NMR: 47 mg/dL (ref >39)
HDL Particle Number: 38 umol/L (ref >=30.5)
LDL Particle Number: 1284 nmol/L — ABNORMAL HIGH (ref <1000)
LDL Size: 20 nm (ref >20.5)
LDL-C: 74 mg/dL (ref 0–99)
LP-IR Score: 80 — ABNORMAL HIGH (ref <=45)
SMALL LDL PARTICLE NUMBER: 909 nmol/L — AB (ref <=527)
TRIGLYCERIDES BY NMR: 163 mg/dL — AB (ref 0–149)

## 2015-04-12 LAB — BMP8+EGFR
BUN / CREAT RATIO: 14 (ref 10–22)
BUN: 12 mg/dL (ref 8–27)
CALCIUM: 9.2 mg/dL (ref 8.6–10.2)
CHLORIDE: 102 mmol/L (ref 97–108)
CO2: 25 mmol/L (ref 18–29)
Creatinine, Ser: 0.86 mg/dL (ref 0.76–1.27)
GFR calc Af Amer: 102 mL/min/{1.73_m2} (ref >59)
GFR, EST NON AFRICAN AMERICAN: 88 mL/min/{1.73_m2} (ref >59)
Glucose: 99 mg/dL (ref 65–99)
POTASSIUM: 4.3 mmol/L (ref 3.5–5.2)
SODIUM: 143 mmol/L (ref 134–144)

## 2015-04-12 LAB — HEPATIC FUNCTION PANEL
ALBUMIN: 4.1 g/dL (ref 3.5–4.8)
ALT: 27 IU/L (ref 0–44)
AST: 28 IU/L (ref 0–40)
Alkaline Phosphatase: 47 IU/L (ref 39–117)
BILIRUBIN TOTAL: 0.7 mg/dL (ref 0.0–1.2)
Bilirubin, Direct: 0.19 mg/dL (ref 0.00–0.40)
Total Protein: 6.3 g/dL (ref 6.0–8.5)

## 2015-04-12 LAB — VITAMIN D 25 HYDROXY (VIT D DEFICIENCY, FRACTURES): VIT D 25 HYDROXY: 64.4 ng/mL (ref 30.0–100.0)

## 2015-04-12 LAB — PSA: Prostate Specific Ag, Serum: 2.7 ng/mL (ref 0.0–4.0)

## 2015-04-13 ENCOUNTER — Telehealth: Payer: Self-pay

## 2015-04-13 NOTE — Telephone Encounter (Signed)
[facility] to x-ray 

## 2015-04-28 ENCOUNTER — Encounter: Payer: Self-pay | Admitting: Family Medicine

## 2015-04-28 ENCOUNTER — Ambulatory Visit (INDEPENDENT_AMBULATORY_CARE_PROVIDER_SITE_OTHER): Payer: Commercial Managed Care - HMO | Admitting: Family Medicine

## 2015-04-28 VITALS — BP 134/81 | HR 77 | Temp 98.6°F | Ht 66.0 in | Wt 259.6 lb

## 2015-04-28 DIAGNOSIS — H8110 Benign paroxysmal vertigo, unspecified ear: Secondary | ICD-10-CM | POA: Diagnosis not present

## 2015-04-28 DIAGNOSIS — R42 Dizziness and giddiness: Secondary | ICD-10-CM | POA: Diagnosis not present

## 2015-04-28 NOTE — Progress Notes (Signed)
BP 134/81 mmHg  Pulse 77  Temp(Src) 98.6 F (37 C) (Oral)  Ht '5\' 6"'  (1.676 m)  Wt 259 lb 9.6 oz (117.754 kg)  BMI 41.92 kg/m2   Subjective:    Patient ID: [name] [name], male    DOB: 08-03-1945, 69 y.o.   MRN: [id number]  HPI: [name] [name] is a 69 y.o. male presenting on 04/28/2015 for Dizziness   HPI Dizziness Patient presents today after having dizziness this morning. He awoke at 0400 this morning to go use the restroom and was feeling dizzy and off-balance. His never had this before to this extent. He called EMS and they came out to see him and told him that he had vertigo. Per his story he describes head turning was making it worse if he laid flat and it was fine. He denies any lightheadedness or feelings is going to pass out just more felt like the room was spinning. He had a little nausea with it. He took some Dramamine from his daughter and that helped resolve it. It lasted about 1 hour until 0500. He denies any residual dizziness or spinning sensation or nausea. He denies any chest pains or shortness of breath.  Relevant past medical, surgical, family and social history reviewed and updated as indicated. Interim medical history since our last visit reviewed. Allergies and medications reviewed and updated.  Review of Systems  Constitutional: Negative for fever and chills.  HENT: Negative for ear discharge and ear pain.   Eyes: Negative for discharge and visual disturbance.  Respiratory: Negative for shortness of breath and wheezing.   Cardiovascular: Negative for chest pain and leg swelling.  Gastrointestinal: Positive for nausea. Negative for abdominal pain, diarrhea and constipation.  Genitourinary: Negative for difficulty urinating.  Musculoskeletal: Negative for back pain and gait problem.  Skin: Negative for rash.  Neurological: Positive for dizziness. Negative for syncope, weakness, light-headedness and headaches.  All other systems reviewed and are  negative.   Per HPI unless specifically indicated above     Medication List       This list is accurate as of: 04/28/15  1:55 PM.  Always use your most recent med list.               amLODipine 5 MG tablet  Commonly known as:  NORVASC  Take 1 tablet (5 mg total) by mouth daily.     aspirin 81 MG tablet  Take 81 mg by mouth daily.     benazepril 10 MG tablet  Commonly known as:  LOTENSIN  Take 1 tablet (10 mg total) by mouth daily.     Fish Oil 1000 MG Caps  Take 4 capsules by mouth daily.     Omega 3 1200 MG Caps  Take 1,200 mg by mouth. Four tablets a day     Garlic 2423 MG Caps  Take 2 capsules by mouth daily.     NON FORMULARY  Take 1 tablet by mouth daily. Equate allergy tablet     omeprazole 20 MG capsule  Commonly known as:  PRILOSEC  Take 20 mg by mouth daily.     pravastatin 40 MG tablet  Commonly known as:  PRAVACHOL  Take 1 tablet (40 mg total) by mouth daily.     vardenafil 20 MG tablet  Commonly known as:  LEVITRA  Take 20 mg by mouth daily as needed.     vitamin C 500 MG tablet  Commonly known as:  ASCORBIC ACID  Take 500  mg by mouth daily.     Vitamin D-3 5000 UNITS Tabs  Take 1 tablet by mouth daily.           Objective:    BP 134/81 mmHg  Pulse 77  Temp(Src) 98.6 F (37 C) (Oral)  Ht '5\' 6"'  (1.676 m)  Wt 259 lb 9.6 oz (117.754 kg)  BMI 41.92 kg/m2  Wt Readings from Last 3 Encounters:  04/28/15 259 lb 9.6 oz (117.754 kg)  04/11/15 256 lb (116.121 kg)  11/24/14 263 lb (119.296 kg)    Physical Exam  Constitutional: He is oriented to person, place, and time. He appears well-developed and well-nourished. No distress.  HENT:  Right Ear: External ear normal.  Left Ear: External ear normal.  Nose: Nose normal.  Mouth/Throat: Oropharynx is clear and moist. No oropharyngeal exudate.  Eyes: Conjunctivae and EOM are normal. Right eye exhibits no discharge. No scleral icterus.  Neck: Neck supple. No thyromegaly present.   Cardiovascular: Normal rate, regular rhythm, normal heart sounds and intact distal pulses.   No murmur heard. Pulmonary/Chest: Effort normal and breath sounds normal. No respiratory distress. He has no wheezes.  Abdominal: He exhibits no distension.  Musculoskeletal: Normal range of motion. He exhibits no edema.  Lymphadenopathy:    He has no cervical adenopathy.  Neurological: He is alert and oriented to person, place, and time. He has normal reflexes. He is not disoriented. No cranial nerve deficit or sensory deficit. He exhibits normal muscle tone. Coordination and gait normal.  Skin: Skin is warm and dry. No rash noted. He is not diaphoretic.  Psychiatric: He has a normal mood and affect. His behavior is normal.  Vitals reviewed.   Results for orders placed or performed in visit on 04/11/15  [facility]  Result Value Ref Range   Glucose 99 65 - 99 mg/dL   BUN 12 8 - 27 mg/dL   Creatinine, Ser 0.86 0.76 - 1.27 mg/dL   GFR calc non Af Amer 88 >59 mL/min/1.73   GFR calc Af Amer 102 >59 mL/min/1.73   BUN/Creatinine Ratio 14 10 - 22   Sodium 143 134 - 144 mmol/L   Potassium 4.3 3.5 - 5.2 mmol/L   Chloride 102 97 - 108 mmol/L   CO2 25 18 - 29 mmol/L   Calcium 9.2 8.6 - 10.2 mg/dL  CBC with Differential/Platelet  Result Value Ref Range   WBC 6.0 3.4 - 10.8 x10E3/uL   RBC 4.26 4.14 - 5.80 x10E6/uL   Hemoglobin 14.4 12.6 - 17.7 g/dL   Hematocrit 41.6 37.5 - 51.0 %   MCV 98 (H) 79 - 97 fL   MCH 33.8 (H) 26.6 - 33.0 pg   MCHC 34.6 31.5 - 35.7 g/dL   RDW 13.4 12.3 - 15.4 %   Platelets 188 150 - 379 x10E3/uL   Neutrophils 56 %   Lymphs 32 %   Monocytes 7 %   Eos 4 %   Basos 1 %   Neutrophils Absolute 3.4 1.4 - 7.0 x10E3/uL   Lymphocytes Absolute 1.9 0.7 - 3.1 x10E3/uL   Monocytes Absolute 0.4 0.1 - 0.9 x10E3/uL   EOS (ABSOLUTE) 0.2 0.0 - 0.4 x10E3/uL   Basophils Absolute 0.0 0.0 - 0.2 x10E3/uL   Immature Granulocytes 0 %   Immature Grans (Abs) 0.0 0.0 - 0.1 x10E3/uL  Hepatic  function panel  Result Value Ref Range   Total Protein 6.3 6.0 - 8.5 g/dL   Albumin 4.1 3.5 - 4.8 g/dL   Bilirubin Total  0.7 0.0 - 1.2 mg/dL   Bilirubin, Direct 0.19 0.00 - 0.40 mg/dL   Alkaline Phosphatase 47 39 - 117 IU/L   AST 28 0 - 40 IU/L   ALT 27 0 - 44 IU/L  NMR, lipoprofile  Result Value Ref Range   LDL Particle Number 1284 (H) <1000 nmol/L   LDL-C 74 0 - 99 mg/dL   HDL Cholesterol by NMR 47 >39 mg/dL   Triglycerides by NMR 163 (H) 0 - 149 mg/dL   Cholesterol 154 100 - 199 mg/dL   HDL Particle Number 38.0 >=30.5 umol/L   Small LDL Particle Number 909 (H) <=527 nmol/L   LDL Size 20.0 >20.5 nm   LP-IR Score 80 (H) <=45  Vit D  25 hydroxy (rtn osteoporosis monitoring)  Result Value Ref Range   Vit D, 25-Hydroxy 64.4 30.0 - 100.0 ng/mL  PSA  Result Value Ref Range   Prostate Specific Ag, Serum 2.7 0.0 - 4.0 ng/mL  POCT UA - Microscopic Only  Result Value Ref Range   WBC, Ur, HPF, POC 1-3    RBC, urine, microscopic rare    Bacteria, U Microscopic neg    Mucus, UA moderate    Epithelial cells, urine per micros neg    Crystals, Ur, HPF, POC neg    Casts, Ur, LPF, POC neg    Yeast, UA neg   POCT urinalysis dipstick  Result Value Ref Range   Color, UA gold    Clarity, UA clear    Glucose, UA neg    Bilirubin, UA neg    Ketones, UA neg    Spec Grav, UA 1.020    Blood, UA neg    pH, UA 6.0    Protein, UA neg    Urobilinogen, UA negative    Nitrite, UA neg    Leukocytes, UA Negative Negative      Assessment & Plan:   Problem List Items Addressed This Visit    None    Visit Diagnoses    Dizziness    -  Primary    Relevant Orders    EKG 12-Lead (Completed)    Benign paroxysmal positional vertigo, unspecified laterality        first time he had this, will use dramamine for now, will call or return if occurs again.        Follow up plan: Return in about 3 months (around 07/28/2015), or if symptoms worsen or fail to improve.  Caryl Pina, MD [facility] 04/28/2015, 1:55 PM

## 2015-05-12 DIAGNOSIS — M25561 Pain in right knee: Secondary | ICD-10-CM | POA: Diagnosis not present

## 2015-05-12 DIAGNOSIS — M170 Bilateral primary osteoarthritis of knee: Secondary | ICD-10-CM | POA: Diagnosis not present

## 2015-05-12 DIAGNOSIS — M25562 Pain in left knee: Secondary | ICD-10-CM | POA: Diagnosis not present

## 2015-05-12 DIAGNOSIS — R262 Difficulty in walking, not elsewhere classified: Secondary | ICD-10-CM | POA: Diagnosis not present

## 2015-05-19 DIAGNOSIS — M25562 Pain in left knee: Secondary | ICD-10-CM | POA: Diagnosis not present

## 2015-05-19 DIAGNOSIS — M170 Bilateral primary osteoarthritis of knee: Secondary | ICD-10-CM | POA: Diagnosis not present

## 2015-05-19 DIAGNOSIS — M25561 Pain in right knee: Secondary | ICD-10-CM | POA: Diagnosis not present

## 2015-05-26 DIAGNOSIS — M25562 Pain in left knee: Secondary | ICD-10-CM | POA: Diagnosis not present

## 2015-05-26 DIAGNOSIS — M170 Bilateral primary osteoarthritis of knee: Secondary | ICD-10-CM | POA: Diagnosis not present

## 2015-05-26 DIAGNOSIS — M25561 Pain in right knee: Secondary | ICD-10-CM | POA: Diagnosis not present

## 2015-06-02 DIAGNOSIS — M25562 Pain in left knee: Secondary | ICD-10-CM | POA: Diagnosis not present

## 2015-06-02 DIAGNOSIS — M170 Bilateral primary osteoarthritis of knee: Secondary | ICD-10-CM | POA: Diagnosis not present

## 2015-06-02 DIAGNOSIS — M25561 Pain in right knee: Secondary | ICD-10-CM | POA: Diagnosis not present

## 2015-06-26 ENCOUNTER — Encounter: Payer: Self-pay | Admitting: Internal Medicine

## 2015-08-09 ENCOUNTER — Ambulatory Visit (INDEPENDENT_AMBULATORY_CARE_PROVIDER_SITE_OTHER): Payer: Commercial Managed Care - HMO | Admitting: Family Medicine

## 2015-08-09 ENCOUNTER — Encounter: Payer: Self-pay | Admitting: Family Medicine

## 2015-08-09 VITALS — BP 134/79 | HR 54 | Temp 97.0°F | Ht 66.0 in | Wt 264.0 lb

## 2015-08-09 DIAGNOSIS — E559 Vitamin D deficiency, unspecified: Secondary | ICD-10-CM

## 2015-08-09 DIAGNOSIS — E291 Testicular hypofunction: Secondary | ICD-10-CM | POA: Diagnosis not present

## 2015-08-09 DIAGNOSIS — E785 Hyperlipidemia, unspecified: Secondary | ICD-10-CM | POA: Diagnosis not present

## 2015-08-09 DIAGNOSIS — M170 Bilateral primary osteoarthritis of knee: Secondary | ICD-10-CM | POA: Diagnosis not present

## 2015-08-09 DIAGNOSIS — K219 Gastro-esophageal reflux disease without esophagitis: Secondary | ICD-10-CM

## 2015-08-09 DIAGNOSIS — N400 Enlarged prostate without lower urinary tract symptoms: Secondary | ICD-10-CM

## 2015-08-09 DIAGNOSIS — I10 Essential (primary) hypertension: Secondary | ICD-10-CM

## 2015-08-09 DIAGNOSIS — E349 Endocrine disorder, unspecified: Secondary | ICD-10-CM

## 2015-08-09 NOTE — Patient Instructions (Addendum)
Medicare Annual Wellness Visit  [facility] and the medical providers at [facility] strive to bring you the best medical care.  In doing so we not only want to address your current medical conditions and concerns but also to detect new conditions early and prevent illness, disease and health-related problems.    Medicare offers a yearly Wellness Visit which allows our clinical staff to assess your need for preventative services including immunizations, lifestyle education, counseling to decrease risk of preventable diseases and screening for fall risk and other medical concerns.    This visit is provided free of charge (no copay) for all Medicare recipients. The clinical pharmacists at [facility] have begun to conduct these Wellness Visits which will also include a thorough review of all your medications.    As you primary medical provider recommend that you make an appointment for your Annual Wellness Visit if you have not done so already this year.  You may set up this appointment before you leave today or you may call back [phone] and schedule an appointment.  Please make sure when you call that you mention that you are scheduling your Annual Wellness Visit with the clinical pharmacist so that the appointment may be made for the proper length of time.     Continue current medications. Continue good therapeutic lifestyle changes which include good diet and exercise. Fall precautions discussed with patient. If an FOBT was given today- please return it to our front desk. If you are over 21 years old - you may need Prevnar 68 or the adult Pneumonia vaccine.  **Flu shots are available--- please call and schedule a FLU-CLINIC appointment**  After your visit with [place] today you will receive a survey in the mail or online from [employer] regarding your care with [place]. Please take a moment to fill this out. Your feedback is very  important to [place] as you can help [place] better understand your patient needs as well as improve your experience and satisfaction. WE CARE ABOUT YOU!!!   The patient should continue to follow-up with his orthopedist regarding his knee pain and problems He should check with his insurance regarding his need for a tetanus shot He needs to make arrangements to have his eyes examined and make sure that we get a copy of that report once it occurs

## 2015-08-09 NOTE — Progress Notes (Signed)
Subjective:    Patient ID: [name] [name], male    DOB: 1945/11/15, 70 y.o.   MRN: [id number]  HPI Pt here for follow up and management of chronic medical problems which includes hypertensin and hyperlipidemia. He is taking medications regularly. The patient comes to the visit today with his wife. He has no specific complaints. He is due to get lab work today and a tetanus shot today and to return an FOBT. He does not need any refills. The patient denies chest pain or tightness, shortness of breath trouble swallowing heartburn indigestion nausea vomiting diarrhea or blood in the stool. He's passing his water without problems. He does complain of back pain and hip pain and especially left knee pain. He is been followed by orthopedist for this and has an upcoming appointment for recheck in February. He is been receiving injections. He is due to get lab work today and return his FOBT and he will check with his insurance regarding the tetanus shot.     Patient Active Problem List   Diagnosis Date Noted  . Hyperlipidemia 07/01/2014  . Erectile dysfunction 02/28/2014  . Vitamin D deficiency 12/14/2012  . Testosterone deficiency   . Benign essential hypertension antepartum   . Sinus bradycardia   . GERD (gastroesophageal reflux disease)   . Colon polyp history   . BPH (benign prostatic hyperplasia)    Outpatient Encounter Prescriptions as of 08/09/2015  Medication Sig  . amLODipine (NORVASC) 5 MG tablet Take 1 tablet (5 mg total) by mouth daily.  Marland Kitchen aspirin 81 MG tablet Take 81 mg by mouth daily.    . benazepril (LOTENSIN) 10 MG tablet Take 1 tablet (10 mg total) by mouth daily.  . Cholecalciferol (VITAMIN D-3) 5000 UNITS TABS Take 1 tablet by mouth daily.    . Garlic 5027 MG CAPS Take 2 capsules by mouth daily.  . NON FORMULARY Take 1 tablet by mouth daily. Equate allergy tablet  . omeprazole (PRILOSEC) 20 MG capsule Take 20 mg by mouth daily.  . pravastatin (PRAVACHOL) 40 MG tablet Take 1  tablet (40 mg total) by mouth daily.  . vardenafil (LEVITRA) 20 MG tablet Take 20 mg by mouth daily as needed.    . vitamin C (ASCORBIC ACID) 500 MG tablet Take 500 mg by mouth daily.  . [DISCONTINUED] Omega 3 1200 MG CAPS Take 1,200 mg by mouth. Four tablets a day  . [DISCONTINUED] Omega-3 Fatty Acids (FISH OIL) 1000 MG CAPS Take 4 capsules by mouth daily. Reported on 08/09/2015   No facility-administered encounter medications on file as of 08/09/2015.     Review of Systems  Constitutional: Negative.   HENT: Negative.   Eyes: Negative.   Respiratory: Negative.   Cardiovascular: Negative.   Gastrointestinal: Negative.   Endocrine: Negative.   Genitourinary: Negative.   Musculoskeletal: Negative.   Skin: Negative.   Allergic/Immunologic: Negative.   Neurological: Negative.   Hematological: Negative.   Psychiatric/Behavioral: Negative.        Objective:   Physical Exam  Constitutional: He is oriented to person, place, and time. He appears well-developed and well-nourished. No distress.  HENT:  Head: Normocephalic and atraumatic.  Right Ear: External ear normal.  Left Ear: External ear normal.  Mouth/Throat: Oropharynx is clear and moist. No oropharyngeal exudate.  Nasal congestion bilaterally  Eyes: Conjunctivae and EOM are normal. Pupils are equal, round, and reactive to light. Right eye exhibits no discharge. Left eye exhibits no discharge. No scleral icterus.  Neck: Normal range  of motion. Neck supple. No thyromegaly present.  No bruits thyromegaly or anterior cervical adenopathy  Cardiovascular: Normal rate, regular rhythm, normal heart sounds and intact distal pulses.   No murmur heard. The heart has a regular rate and rhythm at 72/m  Pulmonary/Chest: Effort normal and breath sounds normal. No respiratory distress. He has no wheezes. He has no rales. He exhibits no tenderness.  Clear anteriorly and posteriorly  Abdominal: Soft. Bowel sounds are normal. He exhibits no  mass. There is no tenderness. There is no rebound and no guarding.  Abdominal obesity without masses tenderness or organ enlargement  Musculoskeletal: He exhibits no edema or tenderness.  Patient has stiffness and decreased mobility in both knees left greater than right  Lymphadenopathy:    He has no cervical adenopathy.  Neurological: He is alert and oriented to person, place, and time. He has normal reflexes. No cranial nerve deficit.  Skin: Skin is warm and dry. No rash noted.  Psychiatric: He has a normal mood and affect. His behavior is normal. Judgment and thought content normal.  Nursing note and vitals reviewed.   BP 134/79 mmHg  Pulse 54  Temp(Src) 97 F (36.1 C) (Oral)  Ht '5\' 6"'  (1.676 m)  Wt 264 lb (119.75 kg)  BMI 42.63 kg/m2       Assessment & Plan:  1. Essential hypertension -The blood pressure is good today the patient should continue with current treatment - BMP8+EGFR - CBC with Differential/Platelet - Hepatic function panel  2. Hyperlipidemia -Continue with current treatment pending results of lab work - CBC with Differential/Platelet - NMR, lipoprofile  3. Vitamin D deficiency -Continue with current treatment pending results of lab work - CBC with Differential/Platelet - VITAMIN D 25 Hydroxy (Vit-D Deficiency, Fractures)  4. BPH (benign prostatic hyperplasia) -The patient has no complaints with passing his water - CBC with Differential/Platelet  5. Testosterone deficiency -He continues to use Levitra and no complaints with this today. - CBC with Differential/Platelet  6. Gastroesophageal reflux disease without esophagitis -He denies reflux symptoms or abdominal pain and should continue with his omeprazole - CBC with Differential/Platelet - Hepatic function panel  7. Primary osteoarthritis of both knees -Continue to follow-up with orthopedist  Patient Instructions                       Medicare Annual Wellness Visit  [facility] and the  medical providers at [facility] strive to bring you the best medical care.  In doing so we not only want to address your current medical conditions and concerns but also to detect new conditions early and prevent illness, disease and health-related problems.    Medicare offers a yearly Wellness Visit which allows our clinical staff to assess your need for preventative services including immunizations, lifestyle education, counseling to decrease risk of preventable diseases and screening for fall risk and other medical concerns.    This visit is provided free of charge (no copay) for all Medicare recipients. The clinical pharmacists at [facility] have begun to conduct these Wellness Visits which will also include a thorough review of all your medications.    As you primary medical provider recommend that you make an appointment for your Annual Wellness Visit if you have not done so already this year.  You may set up this appointment before you leave today or you may call back ([phone]) and schedule an appointment.  Please make sure when you call that you  mention that you are scheduling your Annual Wellness Visit with the clinical pharmacist so that the appointment may be made for the proper length of time.     Continue current medications. Continue good therapeutic lifestyle changes which include good diet and exercise. Fall precautions discussed with patient. If an FOBT was given today- please return it to our front desk. If you are over 44 years old - you may need Prevnar 61 or the adult Pneumonia vaccine.  **Flu shots are available--- please call and schedule a FLU-CLINIC appointment**  After your visit with [place] today you will receive a survey in the mail or online from [employer] regarding your care with [place]. Please take a moment to fill this out. Your feedback is very important to [place] as you can help [place] better understand your patient needs as well  as improve your experience and satisfaction. WE CARE ABOUT YOU!!!   The patient should continue to follow-up with his orthopedist regarding his knee pain and problems He should check with his insurance regarding his need for a tetanus shot He needs to make arrangements to have his eyes examined and make sure that we get a copy of that report once it occurs   Arrie Senate MD

## 2015-08-10 LAB — HEPATIC FUNCTION PANEL
ALK PHOS: 56 IU/L (ref 39–117)
ALT: 23 IU/L (ref 0–44)
AST: 23 IU/L (ref 0–40)
Albumin: 4.3 g/dL (ref 3.5–4.8)
Bilirubin Total: 0.7 mg/dL (ref 0.0–1.2)
Bilirubin, Direct: 0.17 mg/dL (ref 0.00–0.40)
TOTAL PROTEIN: 6.5 g/dL (ref 6.0–8.5)

## 2015-08-10 LAB — BMP8+EGFR
BUN / CREAT RATIO: 14 (ref 10–22)
BUN: 14 mg/dL (ref 8–27)
CO2: 28 mmol/L (ref 18–29)
CREATININE: 0.97 mg/dL (ref 0.76–1.27)
Calcium: 9.9 mg/dL (ref 8.6–10.2)
Chloride: 98 mmol/L (ref 96–106)
GFR calc Af Amer: 90 mL/min/{1.73_m2} (ref >59)
GFR calc non Af Amer: 78 mL/min/{1.73_m2} (ref >59)
Glucose: 109 mg/dL — ABNORMAL HIGH (ref 65–99)
Potassium: 4.7 mmol/L (ref 3.5–5.2)
Sodium: 139 mmol/L (ref 134–144)

## 2015-08-10 LAB — CBC WITH DIFFERENTIAL/PLATELET
Basophils Absolute: 0 10*3/uL (ref 0.0–0.2)
Basos: 1 %
EOS (ABSOLUTE): 0.3 10*3/uL (ref 0.0–0.4)
EOS: 4 %
HEMATOCRIT: 42.1 % (ref 37.5–51.0)
HEMOGLOBIN: 14.5 g/dL (ref 12.6–17.7)
Immature Grans (Abs): 0 10*3/uL (ref 0.0–0.1)
Immature Granulocytes: 0 %
Lymphocytes Absolute: 2.3 10*3/uL (ref 0.7–3.1)
Lymphs: 33 %
MCH: 32.8 pg (ref 26.6–33.0)
MCHC: 34.4 g/dL (ref 31.5–35.7)
MCV: 95 fL (ref 79–97)
MONOCYTES: 7 %
Monocytes Absolute: 0.5 10*3/uL (ref 0.1–0.9)
Neutrophils Absolute: 3.8 10*3/uL (ref 1.4–7.0)
Neutrophils: 55 %
Platelets: 198 10*3/uL (ref 150–379)
RBC: 4.42 x10E6/uL (ref 4.14–5.80)
RDW: 12.6 % (ref 12.3–15.4)
WBC: 7 10*3/uL (ref 3.4–10.8)

## 2015-08-10 LAB — NMR, LIPOPROFILE
CHOLESTEROL: 146 mg/dL (ref 100–199)
HDL Cholesterol by NMR: 48 mg/dL (ref >39)
HDL PARTICLE NUMBER: 39.4 umol/L (ref >=30.5)
LDL Particle Number: 1113 nmol/L — ABNORMAL HIGH (ref <1000)
LDL SIZE: 20.3 nm (ref >20.5)
LDL-C: 68 mg/dL (ref 0–99)
LP-IR Score: 72 — ABNORMAL HIGH (ref <=45)
SMALL LDL PARTICLE NUMBER: 671 nmol/L — AB (ref <=527)
TRIGLYCERIDES BY NMR: 152 mg/dL — AB (ref 0–149)

## 2015-08-10 LAB — VITAMIN D 25 HYDROXY (VIT D DEFICIENCY, FRACTURES): Vit D, 25-Hydroxy: 55.4 ng/mL (ref 30.0–100.0)

## 2015-09-08 ENCOUNTER — Telehealth: Payer: Self-pay | Admitting: Family Medicine

## 2015-09-08 NOTE — Telephone Encounter (Signed)
Not on med list -- lm 2/10-jhb

## 2015-09-08 NOTE — Telephone Encounter (Signed)
This is okay 1. If the constipation persists he should come to the doctor

## 2015-09-08 NOTE — Telephone Encounter (Signed)
This is a old RX from 2013 - he only uses this when he gets constipated - per wife. It is the lactulose solution 10g/15 ml - use PRN  Is this ok to fill

## 2015-09-11 MED ORDER — LACTULOSE 10 GM/15ML PO SOLN: 10.0000 g | Freq: Every day | ORAL | Status: DC | PRN

## 2015-09-11 NOTE — Telephone Encounter (Signed)
Ordered and pt aware °

## 2015-09-14 DIAGNOSIS — M170 Bilateral primary osteoarthritis of knee: Secondary | ICD-10-CM | POA: Diagnosis not present

## 2015-12-21 DIAGNOSIS — R262 Difficulty in walking, not elsewhere classified: Secondary | ICD-10-CM | POA: Diagnosis not present

## 2015-12-21 DIAGNOSIS — M25562 Pain in left knee: Secondary | ICD-10-CM | POA: Diagnosis not present

## 2015-12-21 DIAGNOSIS — M25561 Pain in right knee: Secondary | ICD-10-CM | POA: Diagnosis not present

## 2015-12-21 DIAGNOSIS — M170 Bilateral primary osteoarthritis of knee: Secondary | ICD-10-CM | POA: Diagnosis not present

## 2015-12-29 ENCOUNTER — Encounter: Payer: Self-pay | Admitting: Family Medicine

## 2015-12-29 ENCOUNTER — Ambulatory Visit (INDEPENDENT_AMBULATORY_CARE_PROVIDER_SITE_OTHER): Payer: Commercial Managed Care - HMO | Admitting: Family Medicine

## 2015-12-29 VITALS — BP 106/63 | HR 53 | Temp 98.0°F | Ht 66.0 in | Wt 265.0 lb

## 2015-12-29 DIAGNOSIS — E559 Vitamin D deficiency, unspecified: Secondary | ICD-10-CM | POA: Diagnosis not present

## 2015-12-29 DIAGNOSIS — K219 Gastro-esophageal reflux disease without esophagitis: Secondary | ICD-10-CM

## 2015-12-29 DIAGNOSIS — R140 Abdominal distension (gaseous): Secondary | ICD-10-CM | POA: Diagnosis not present

## 2015-12-29 DIAGNOSIS — G629 Polyneuropathy, unspecified: Secondary | ICD-10-CM

## 2015-12-29 DIAGNOSIS — E785 Hyperlipidemia, unspecified: Secondary | ICD-10-CM | POA: Diagnosis not present

## 2015-12-29 DIAGNOSIS — E291 Testicular hypofunction: Secondary | ICD-10-CM | POA: Diagnosis not present

## 2015-12-29 DIAGNOSIS — N400 Enlarged prostate without lower urinary tract symptoms: Secondary | ICD-10-CM

## 2015-12-29 DIAGNOSIS — E349 Endocrine disorder, unspecified: Secondary | ICD-10-CM

## 2015-12-29 DIAGNOSIS — I10 Essential (primary) hypertension: Secondary | ICD-10-CM | POA: Diagnosis not present

## 2015-12-29 MED ORDER — OMEPRAZOLE 20 MG PO CPDR: 20.0000 mg | DELAYED_RELEASE_CAPSULE | Freq: Every day | ORAL | Status: DC

## 2015-12-29 MED ORDER — LACTULOSE 10 GM/15ML PO SOLN: 10.0000 g | Freq: Every day | ORAL | Status: DC | PRN

## 2015-12-29 MED ORDER — PRAVASTATIN SODIUM 40 MG PO TABS: 40.0000 mg | ORAL_TABLET | Freq: Every day | ORAL | Status: DC

## 2015-12-29 MED ORDER — AMLODIPINE BESYLATE 5 MG PO TABS: 5.0000 mg | ORAL_TABLET | Freq: Every day | ORAL | Status: DC

## 2015-12-29 MED ORDER — BENAZEPRIL HCL 10 MG PO TABS: 10.0000 mg | ORAL_TABLET | Freq: Every day | ORAL | Status: DC

## 2015-12-29 NOTE — Progress Notes (Signed)
Subjective:    Patient ID: [name] [name], male    DOB: 10-03-1945, 70 y.o.   MRN: [id number]  HPI Pt here for follow up and management of chronic medical problems which includes hypertension and hyperlipidemia. He is taking medications regularly.The patient does complain of some bloating and also that his legs have a burning sensation in them. He is due to get lab work today and will be given an FOBT to return. He is requesting refills on all of his medicines and these are mail order. The patient denies any chest pain or chest tightness. He has occasional heartburn and only takes his proton pump inhibitor when he has this problem. This is usually related to eating. He also has bloating at least once a week and he thinks this is also related to what he is been eating. His weight is stable. He denies any changes in his bowel habits or his stools. He denies black tarry stools or blood in the stool. His bowel habits are regular. She's had he is having no trouble passing his water. He does complain with his legs burning and stinging at times. This is down on the lateral aspects of both lower legs and he notices this especially after being at work all day and coming home and sitting down and then getting up again. This happens at least weekly. As far as his legs are concerned he is been seeing a chiropractor and has had x-rays of his back back in the fall. He will try to get a copy of this report to [place] for review and we will give him the option of having x-rays done here with a future order for this if he desires.      Patient Active Problem List   Diagnosis Date Noted  . Hyperlipidemia 07/01/2014  . Erectile dysfunction 02/28/2014  . Vitamin D deficiency 12/14/2012  . Testosterone deficiency   . Benign essential hypertension antepartum   . Sinus bradycardia   . GERD (gastroesophageal reflux disease)   . Colon polyp history   . BPH (benign prostatic hyperplasia)    Outpatient Encounter  Prescriptions as of 12/29/2015  Medication Sig  . amLODipine (NORVASC) 5 MG tablet Take 1 tablet (5 mg total) by mouth daily.  Marland Kitchen aspirin 81 MG tablet Take 81 mg by mouth daily.    . benazepril (LOTENSIN) 10 MG tablet Take 1 tablet (10 mg total) by mouth daily.  . Cholecalciferol (VITAMIN D-3) 5000 UNITS TABS Take 1 tablet by mouth daily.    . Garlic 5573 MG CAPS Take 2 capsules by mouth daily.  Marland Kitchen lactulose (CHRONULAC) 10 GM/15ML solution Take 15 mLs (10 g total) by mouth daily as needed for mild constipation.  . NON FORMULARY Take 1 tablet by mouth daily. Equate allergy tablet  . Omega-3 Fatty Acids (FISH OIL) 1000 MG CAPS Take 2 capsules by mouth daily.  Marland Kitchen omeprazole (PRILOSEC) 20 MG capsule Take 20 mg by mouth daily.  . pravastatin (PRAVACHOL) 40 MG tablet Take 1 tablet (40 mg total) by mouth daily.  . vardenafil (LEVITRA) 20 MG tablet Take 20 mg by mouth daily as needed.    . vitamin C (ASCORBIC ACID) 500 MG tablet Take 500 mg by mouth daily.   No facility-administered encounter medications on file as of 12/29/2015.     Review of Systems  Constitutional: Negative.   HENT: Negative.   Eyes: Negative.   Respiratory: Negative.   Cardiovascular: Negative.   Gastrointestinal: Negative.  Bloating  Endocrine: Negative.   Genitourinary: Negative.   Musculoskeletal: Negative.        Legs burning - "on fire"  Skin: Negative.   Allergic/Immunologic: Negative.   Neurological: Negative.   Hematological: Negative.   Psychiatric/Behavioral: Negative.        Objective:   Physical Exam  Constitutional: He is oriented to person, place, and time. He appears well-developed and well-nourished. No distress.  The patient is pleasant and alert  HENT:  Head: Normocephalic and atraumatic.  Right Ear: External ear normal.  Left Ear: External ear normal.  Mouth/Throat: Oropharynx is clear and moist. No oropharyngeal exudate.  Slight nasal congestion bilaterally  Eyes: Conjunctivae and EOM  are normal. Pupils are equal, round, and reactive to light. Right eye exhibits no discharge. Left eye exhibits no discharge. No scleral icterus.  Neck: Normal range of motion. Neck supple. No thyromegaly present.  No thyromegaly anterior cervical adenopathy or bruits  Cardiovascular: Normal rate, regular rhythm, normal heart sounds and intact distal pulses.  Exam reveals no friction rub.   No murmur heard. Heart is regular at 72/m  Pulmonary/Chest: Effort normal and breath sounds normal. No respiratory distress. He has no wheezes. He has no rales. He exhibits no tenderness.  Clear anteriorly and posteriorly no axillary adenopathy  Abdominal: Soft. Bowel sounds are normal. He exhibits no mass. There is no tenderness. There is no rebound and no guarding.  Abdominal obesity without masses or organ enlargement or bruits or tenderness  Musculoskeletal: Normal range of motion. He exhibits no edema or tenderness.  Lymphadenopathy:    He has no cervical adenopathy.  Neurological: He is alert and oriented to person, place, and time. He has normal reflexes. No cranial nerve deficit.  Skin: Skin is warm and dry. No rash noted.  Psychiatric: He has a normal mood and affect. His behavior is normal. Judgment and thought content normal.  Nursing note and vitals reviewed.  BP 106/63 mmHg  Pulse 53  Temp(Src) 98 F (36.7 C) (Oral)  Ht _0  (1.676 m)  Wt 265 lb (120.203 kg)  BMI 42.79 kg/m2        Assessment & Plan:  1. Essential hypertension -The blood pressure is good today and the patient will continue with his current medicines and watching sodium intake - BMP8+EGFR - CBC with Differential/Platelet - Hepatic function panel  2. Hyperlipidemia -Continue with pravastatin and aggressive therapeutic lifestyle changes - CBC with Differential/Platelet - NMR, lipoprofile  3. Vitamin D deficiency -Continue with current treatment pending results of lab work - CBC with Differential/Platelet -  VITAMIN D 25 Hydroxy (Vit-D Deficiency, Fractures)  4. BPH (benign prostatic hyperplasia) -The patient has no complaints with his BPH today. - CBC with Differential/Platelet  5. Gastroesophageal reflux disease without esophagitis -He has only occasional reflux symptoms and heartburn and takes his omeprazole when necessary. I recommended that he try ranitidine over-the-counter and take this prior to eating foods that will bother him. - CBC with Differential/Platelet  6. Testosterone deficiency -He is on no testosterone replacement and prefers to not take any testosterone at this time. - CBC with Differential/Platelet  7. Neuropathy ([place]) -He will try to get a copy of the x-ray report from his chiropractor for [place] to review. We will place an order for future LS spine films if he desires to get more films for further evaluation of his low back and lower leg burning sensations. - Thyroid Panel With TSH - Vitamin B12  8. Generalized bloating -Start probiotic  like align avoid foods that may cause more gas and bloating and continue to drink plenty of fluids - CT Abdomen Pelvis Wo Contrast; Future  Meds ordered this encounter  Medications  . Omega-3 Fatty Acids (FISH OIL) 1000 MG CAPS    Sig: Take 2 capsules by mouth daily.  Marland Kitchen amLODipine (NORVASC) 5 MG tablet    Sig: Take 1 tablet (5 mg total) by mouth daily.    Dispense:  90 tablet    Refill:  3  . benazepril (LOTENSIN) 10 MG tablet    Sig: Take 1 tablet (10 mg total) by mouth daily.    Dispense:  90 tablet    Refill:  3  . lactulose (CHRONULAC) 10 GM/15ML solution    Sig: Take 15 mLs (10 g total) by mouth daily as needed for mild constipation.    Dispense:  360 mL    Refill:  1  . omeprazole (PRILOSEC) 20 MG capsule    Sig: Take 1 capsule (20 mg total) by mouth daily.    Dispense:  90 capsule    Refill:  3  . pravastatin (PRAVACHOL) 40 MG tablet    Sig: Take 1 tablet (40 mg total) by mouth daily.    Dispense:  90 tablet     Refill:  3   Patient Instructions                       Medicare Annual Wellness Visit  [facility] and the medical providers at [facility] strive to bring you the best medical care.  In doing so we not only want to address your current medical conditions and concerns but also to detect new conditions early and prevent illness, disease and health-related problems.    Medicare offers a yearly Wellness Visit which allows our clinical staff to assess your need for preventative services including immunizations, lifestyle education, counseling to decrease risk of preventable diseases and screening for fall risk and other medical concerns.    This visit is provided free of charge (no copay) for all Medicare recipients. The clinical pharmacists at [facility] have begun to conduct these Wellness Visits which will also include a thorough review of all your medications.    As you primary medical provider recommend that you make an appointment for your Annual Wellness Visit if you have not done so already this year.  You may set up this appointment before you leave today or you may call back [phone]) and schedule an appointment.  Please make sure when you call that you mention that you are scheduling your Annual Wellness Visit with the clinical pharmacist so that the appointment may be made for the proper length of time.   Continue current medications. Continue good therapeutic lifestyle changes which include good diet and exercise. Fall precautions discussed with patient. If an FOBT was given today- please return it to our front desk. If you are over 18 years old - you may need Prevnar 56 or the adult Pneumonia vaccine.  **Flu shots are available--- please call and schedule a FLU-CLINIC appointment**  After your visit with [place] today you will receive a survey in the mail or online from [employer] regarding your care with [place]. Please take a moment to fill this  out. Your feedback is very important to [place] as you can help [place] better understand your patient needs as well as improve your experience and satisfaction. WE CARE ABOUT YOU!!!   Please  return to this office for LS spine films if you desire an order will be there for this to be done We will call with lab work results as soon as these results become available For the gas and bloating we will arrange for you to have a special x-ray of your abdomen and pelvis. This will either be an ultrasound or a CT scan In the meantime avoid foods that cause gas and bloating and try a probiotic like align and take this once daily Continue to drink plenty of fluids Avoid pesticides and herbicides as much as possible Return the FOBT test   Arrie Senate MD

## 2015-12-29 NOTE — Patient Instructions (Addendum)
Medicare Annual Wellness Visit  [facility] and the medical providers at [facility] strive to bring you the best medical care.  In doing so we not only want to address your current medical conditions and concerns but also to detect new conditions early and prevent illness, disease and health-related problems.    Medicare offers a yearly Wellness Visit which allows our clinical staff to assess your need for preventative services including immunizations, lifestyle education, counseling to decrease risk of preventable diseases and screening for fall risk and other medical concerns.    This visit is provided free of charge (no copay) for all Medicare recipients. The clinical pharmacists at [facility] have begun to conduct these Wellness Visits which will also include a thorough review of all your medications.    As you primary medical provider recommend that you make an appointment for your Annual Wellness Visit if you have not done so already this year.  You may set up this appointment before you leave today or you may call back [phone] and schedule an appointment.  Please make sure when you call that you mention that you are scheduling your Annual Wellness Visit with the clinical pharmacist so that the appointment may be made for the proper length of time.   Continue current medications. Continue good therapeutic lifestyle changes which include good diet and exercise. Fall precautions discussed with patient. If an FOBT was given today- please return it to our front desk. If you are over 12 years old - you may need Prevnar 64 or the adult Pneumonia vaccine.  **Flu shots are available--- please call and schedule a FLU-CLINIC appointment**  After your visit with [place] today you will receive a survey in the mail or online from [employer] regarding your care with [place]. Please take a moment to fill this out. Your feedback is very important  to [place] as you can help [place] better understand your patient needs as well as improve your experience and satisfaction. WE CARE ABOUT YOU!!!   Please return to this office for LS spine films if you desire an order will be there for this to be done We will call with lab work results as soon as these results become available For the gas and bloating we will arrange for you to have a special x-ray of your abdomen and pelvis. This will either be an ultrasound or a CT scan In the meantime avoid foods that cause gas and bloating and try a probiotic like align and take this once daily Continue to drink plenty of fluids Avoid pesticides and herbicides as much as possible Return the FOBT test

## 2015-12-30 LAB — VITAMIN B12: Vitamin B-12: 173 pg/mL — ABNORMAL LOW (ref 211–946)

## 2015-12-30 LAB — BMP8+EGFR
BUN / CREAT RATIO: 14 (ref 10–24)
BUN: 14 mg/dL (ref 8–27)
CHLORIDE: 100 mmol/L (ref 96–106)
CO2: 26 mmol/L (ref 18–29)
CREATININE: 1 mg/dL (ref 0.76–1.27)
Calcium: 9.7 mg/dL (ref 8.6–10.2)
GFR calc non Af Amer: 75 mL/min/{1.73_m2} (ref >59)
GFR, EST AFRICAN AMERICAN: 87 mL/min/{1.73_m2} (ref >59)
Glucose: 113 mg/dL — ABNORMAL HIGH (ref 65–99)
Potassium: 4.6 mmol/L (ref 3.5–5.2)
SODIUM: 140 mmol/L (ref 134–144)

## 2015-12-30 LAB — THYROID PANEL WITH TSH
Free Thyroxine Index: 1.6 (ref 1.2–4.9)
T3 UPTAKE RATIO: 28 % (ref 24–39)
T4 TOTAL: 5.7 ug/dL (ref 4.5–12.0)
TSH: 3.43 u[IU]/mL (ref 0.450–4.500)

## 2015-12-30 LAB — CBC WITH DIFFERENTIAL/PLATELET
Basophils Absolute: 0 10*3/uL (ref 0.0–0.2)
Basos: 1 %
EOS (ABSOLUTE): 0.2 10*3/uL (ref 0.0–0.4)
EOS: 3 %
HEMOGLOBIN: 13.5 g/dL (ref 12.6–17.7)
Hematocrit: 40 % (ref 37.5–51.0)
IMMATURE GRANS (ABS): 0 10*3/uL (ref 0.0–0.1)
Immature Granulocytes: 0 %
LYMPHS ABS: 2.2 10*3/uL (ref 0.7–3.1)
Lymphs: 35 %
MCH: 32.1 pg (ref 26.6–33.0)
MCHC: 33.8 g/dL (ref 31.5–35.7)
MCV: 95 fL (ref 79–97)
MONOCYTES: 7 %
MONOS ABS: 0.5 10*3/uL (ref 0.1–0.9)
NEUTROS ABS: 3.4 10*3/uL (ref 1.4–7.0)
Neutrophils: 54 %
Platelets: 195 10*3/uL (ref 150–379)
RBC: 4.2 x10E6/uL (ref 4.14–5.80)
RDW: 13.5 % (ref 12.3–15.4)
WBC: 6.3 10*3/uL (ref 3.4–10.8)

## 2015-12-30 LAB — NMR, LIPOPROFILE
Cholesterol: 145 mg/dL (ref 100–199)
HDL Cholesterol by NMR: 42 mg/dL (ref >39)
HDL Particle Number: 35.8 umol/L (ref >=30.5)
LDL PARTICLE NUMBER: 1128 nmol/L — AB (ref <1000)
LDL SIZE: 20.3 nm (ref >20.5)
LDL-C: 70 mg/dL (ref 0–99)
LP-IR SCORE: 73 — AB (ref <=45)
Small LDL Particle Number: 624 nmol/L — ABNORMAL HIGH (ref <=527)
Triglycerides by NMR: 167 mg/dL — ABNORMAL HIGH (ref 0–149)

## 2015-12-30 LAB — VITAMIN D 25 HYDROXY (VIT D DEFICIENCY, FRACTURES): VIT D 25 HYDROXY: 87.6 ng/mL (ref 30.0–100.0)

## 2015-12-30 LAB — HEPATIC FUNCTION PANEL
ALK PHOS: 48 IU/L (ref 39–117)
ALT: 21 IU/L (ref 0–44)
AST: 22 IU/L (ref 0–40)
Albumin: 4 g/dL (ref 3.5–4.8)
BILIRUBIN, DIRECT: 0.23 mg/dL (ref 0.00–0.40)
Bilirubin Total: 0.7 mg/dL (ref 0.0–1.2)
Total Protein: 6.2 g/dL (ref 6.0–8.5)

## 2015-12-31 ENCOUNTER — Encounter: Payer: Self-pay | Admitting: Family Medicine

## 2015-12-31 DIAGNOSIS — E538 Deficiency of other specified B group vitamins: Secondary | ICD-10-CM | POA: Insufficient documentation

## 2016-01-05 ENCOUNTER — Ambulatory Visit (HOSPITAL_COMMUNITY)
Admission: RE | Admit: 2016-01-05 | Discharge: 2016-01-05 | Disposition: A | Discharge status: Home / Self Care | Payer: Commercial Managed Care - HMO | Source: Ambulatory Visit | Attending: Family Medicine | Admitting: Family Medicine

## 2016-01-05 ENCOUNTER — Other Ambulatory Visit: Payer: Commercial Managed Care - HMO

## 2016-01-05 DIAGNOSIS — N200 Calculus of kidney: Secondary | ICD-10-CM | POA: Diagnosis not present

## 2016-01-05 DIAGNOSIS — K760 Fatty (change of) liver, not elsewhere classified: Secondary | ICD-10-CM | POA: Diagnosis not present

## 2016-01-05 DIAGNOSIS — K449 Diaphragmatic hernia without obstruction or gangrene: Secondary | ICD-10-CM | POA: Diagnosis not present

## 2016-01-05 DIAGNOSIS — R140 Abdominal distension (gaseous): Secondary | ICD-10-CM

## 2016-01-05 DIAGNOSIS — Z1211 Encounter for screening for malignant neoplasm of colon: Secondary | ICD-10-CM

## 2016-01-05 DIAGNOSIS — I2510 Atherosclerotic heart disease of native coronary artery without angina pectoris: Secondary | ICD-10-CM | POA: Insufficient documentation

## 2016-01-08 ENCOUNTER — Other Ambulatory Visit: Payer: Self-pay | Admitting: *Deleted

## 2016-01-08 DIAGNOSIS — I2510 Atherosclerotic heart disease of native coronary artery without angina pectoris: Secondary | ICD-10-CM

## 2016-01-09 LAB — FECAL OCCULT BLOOD, IMMUNOCHEMICAL: Fecal Occult Bld: NEGATIVE

## 2016-01-26 ENCOUNTER — Ambulatory Visit (INDEPENDENT_AMBULATORY_CARE_PROVIDER_SITE_OTHER): Payer: Commercial Managed Care - HMO | Admitting: Cardiovascular Disease

## 2016-01-26 ENCOUNTER — Encounter: Payer: Self-pay | Admitting: Cardiovascular Disease

## 2016-01-26 ENCOUNTER — Encounter: Payer: Self-pay | Admitting: *Deleted

## 2016-01-26 VITALS — BP 112/58 | HR 60 | Ht 66.0 in | Wt 266.0 lb

## 2016-01-26 DIAGNOSIS — I2510 Atherosclerotic heart disease of native coronary artery without angina pectoris: Secondary | ICD-10-CM | POA: Diagnosis not present

## 2016-01-26 DIAGNOSIS — R600 Localized edema: Secondary | ICD-10-CM

## 2016-01-26 DIAGNOSIS — R001 Bradycardia, unspecified: Secondary | ICD-10-CM | POA: Diagnosis not present

## 2016-01-26 DIAGNOSIS — R5383 Other fatigue: Secondary | ICD-10-CM | POA: Diagnosis not present

## 2016-01-26 NOTE — Patient Instructions (Signed)
Your physician recommends that you schedule a follow-up appointment in: 6 WEEKS WITH DR. KONESWARAN  Your physician recommends that you continue on your current medications as directed. Please refer to the Current Medication list given to you today.  Your physician has requested that you have a lexiscan myoview. For further information please visit [email or web address]. Please follow instruction sheet, as given.  Your physician has requested that you have an echocardiogram. Echocardiography is a painless test that uses sound waves to create images of your heart. It provides your doctor with information about the size and shape of your heart and how well your heart's chambers and valves are working. This procedure takes approximately one hour. There are no restrictions for this procedure.  Thank you for choosing [facility]!!    

## 2016-01-26 NOTE — Progress Notes (Signed)
Patient ID: [name] [name], male   DOB: Jan 15, 1946, 70 y.o.   MRN: [id number]       CARDIOLOGY CONSULT NOTE  Patient ID: [name] [name] MRN: [id number] DOB/AGE: 1946-07-03 70 y.o.  Admit date: (Not on file) Primary Physician: Redge Gainer, MD Referring Physician:  Reason for consult:   HPI: The patient is a 70 year old male with a history of hypertension and hyperlipidemia.He underwent a CT of the abdomen and pelvis on 01/05/16 which demonstrated coronary artery calcifications in the left main and three-vessel coronary artery disease. He denies chest pain, palpitations, orthopnea, and shortness of breath. He said he gets very little physical activity. He has been fatigued for some time. He has had bilateral leg swelling, right greater than left over the past few months.  ECG performed in the office today which are percent interpreted demonstrated sinus bradycardia, heart rate 55 bpm, with no ischemic ST segment or T-wave abnormalities.    Allergies  Allergen Reactions  . Cialis [Tadalafil]   . Viagra [Sildenafil Citrate] Other (See Comments)    heartburn    Current Outpatient Prescriptions  Medication Sig Dispense Refill  . amLODipine (NORVASC) 5 MG tablet Take 1 tablet (5 mg total) by mouth daily. 90 tablet 3  . aspirin 81 MG tablet Take 81 mg by mouth daily.      . benazepril (LOTENSIN) 10 MG tablet Take 1 tablet (10 mg total) by mouth daily. 90 tablet 3  . Cholecalciferol (VITAMIN D-3) 5000 UNITS TABS Take 1 tablet by mouth daily.      . Garlic 123XX123 MG CAPS Take 2 capsules by mouth daily.    Marland Kitchen lactulose (CHRONULAC) 10 GM/15ML solution Take 15 mLs (10 g total) by mouth daily as needed for mild constipation. 360 mL 1  . NON FORMULARY Take 1 tablet by mouth daily. Equate allergy tablet    . Omega-3 Fatty Acids (FISH OIL) 1000 MG CAPS Take 2 capsules by mouth daily.    Marland Kitchen omeprazole (PRILOSEC) 20 MG capsule Take 1 capsule (20 mg total) by mouth daily. 90 capsule 3  .  pravastatin (PRAVACHOL) 40 MG tablet Take 1 tablet (40 mg total) by mouth daily. 90 tablet 3  . vardenafil (LEVITRA) 20 MG tablet Take 20 mg by mouth daily as needed.      . vitamin C (ASCORBIC ACID) 500 MG tablet Take 500 mg by mouth daily.     No current facility-administered medications for this visit.    Past Medical History  Diagnosis Date  . Other testicular hypofunction   . Other and unspecified hyperlipidemia   . Sinus bradycardia   . GERD (gastroesophageal reflux disease)   . Colon polyp   . Hemorrhoid   . Hyperplasia of prostate     Past Surgical History  Procedure Laterality Date  . Appendectomy      Social History   Social History  . Marital Status: Married    Spouse Name: N/A  . Number of Children: N/A  . Years of Education: N/A   Occupational History  . Not on file.   Social History Main Topics  . Smoking status: Light Tobacco Smoker    Types: Cigars  . Smokeless tobacco: Current User    Types: Chew  . Alcohol Use: Yes     Comment: occasional  . Drug Use: No  . Sexual Activity: Not on file   Other Topics Concern  . Not on file   Social History Narrative     No family  history of premature CAD in 1st degree relatives.  Prior to Admission medications   Medication Sig Start Date End Date Taking? Authorizing Provider  amLODipine (NORVASC) 5 MG tablet Take 1 tablet (5 mg total) by mouth daily. 12/29/15  Yes Chipper Herb, MD  aspirin 81 MG tablet Take 81 mg by mouth daily.     Yes Historical Provider, MD  benazepril (LOTENSIN) 10 MG tablet Take 1 tablet (10 mg total) by mouth daily. 12/29/15  Yes Chipper Herb, MD  Cholecalciferol (VITAMIN D-3) 5000 UNITS TABS Take 1 tablet by mouth daily.     Yes Historical Provider, MD  Garlic 123XX123 MG CAPS Take 2 capsules by mouth daily.   Yes Historical Provider, MD  lactulose (CHRONULAC) 10 GM/15ML solution Take 15 mLs (10 g total) by mouth daily as needed for mild constipation. 12/29/15  Yes Chipper Herb, MD    NON FORMULARY Take 1 tablet by mouth daily. Equate allergy tablet   Yes Historical Provider, MD  Omega-3 Fatty Acids (FISH OIL) 1000 MG CAPS Take 2 capsules by mouth daily.   Yes Historical Provider, MD  omeprazole (PRILOSEC) 20 MG capsule Take 1 capsule (20 mg total) by mouth daily. 12/29/15  Yes Chipper Herb, MD  pravastatin (PRAVACHOL) 40 MG tablet Take 1 tablet (40 mg total) by mouth daily. 12/29/15  Yes Chipper Herb, MD  vardenafil (LEVITRA) 20 MG tablet Take 20 mg by mouth daily as needed.     Yes Historical Provider, MD  vitamin C (ASCORBIC ACID) 500 MG tablet Take 500 mg by mouth daily.   Yes Historical Provider, MD     Review of systems complete and found to be negative unless listed above in HPI     Physical exam Blood pressure 112/58, pulse 60, height 5\' 6"  (1.676 m), weight 266 lb (120.657 kg), SpO2 94 %. General: NAD Neck: No JVD, no thyromegaly or thyroid nodule.  Lungs: Clear to auscultation bilaterally with normal respiratory effort. CV: Nondisplaced PMI. Regular rate and rhythm, normal S1/S2, no S3/S4, no murmur.  1+ pitting pretibial edema, r>l.  No carotid bruit.    Abdomen: Firm, obese.  Skin: Intact without lesions or rashes.  Neurologic: Alert and oriented x 3.  Psych: Normal affect. Extremities: No clubbing or cyanosis.  HEENT: Normal.   ECG: Most recent ECG reviewed.  Labs:   Lab Results  Component Value Date   WBC 6.3 12/29/2015   HGB 14.4 11/24/2014   HCT 40.0 12/29/2015   MCV 95 12/29/2015   PLT 195 12/29/2015   No results for input(s): NA, K, CL, CO2, BUN, CREATININE, CALCIUM, PROT, BILITOT, ALKPHOS, ALT, AST, GLUCOSE in the last 168 hours.  Invalid input(s): LABALBU No results found for: CKTOTAL, CKMB, CKMBINDEX, TROPONINI  Lab Results  Component Value Date   CHOL 145 12/29/2015   CHOL 146 08/09/2015   CHOL 154 04/11/2015   Lab Results  Component Value Date   HDL 42 12/29/2015   HDL 48 08/09/2015   HDL 47 04/11/2015   Lab Results   Component Value Date   LDLCALC 84 02/28/2014   LDLCALC 54 10/11/2013   LDLCALC 50 05/03/2013   Lab Results  Component Value Date   TRIG 167* 12/29/2015   TRIG 152* 08/09/2015   TRIG 163* 04/11/2015   No results found for: CHOLHDL No results found for: LDLDIRECT       Studies: No results found.  ASSESSMENT AND PLAN:  1. Fatigue, CAD/coronary artery calcifications: I will  proceed with a nuclear myocardial perfusion imaging study ([place]) to evaluate for ischemic heart disease.  2. Bilateral leg edema: I will order a 2-D echocardiogram with Doppler to evaluate cardiac structure, function, and regional wall motion.   Dispo: fu 6 weeks.   Signed: Kate Sable, M.D., F.A.C.C.  01/26/2016, 10:26 AM

## 2016-02-09 ENCOUNTER — Encounter (HOSPITAL_COMMUNITY)
Admission: RE | Admit: 2016-02-09 | Discharge: 2016-02-09 | Disposition: A | Discharge status: Home / Self Care | Payer: Commercial Managed Care - HMO | Source: Ambulatory Visit | Attending: Cardiovascular Disease | Admitting: Cardiovascular Disease

## 2016-02-09 ENCOUNTER — Inpatient Hospital Stay (HOSPITAL_COMMUNITY): Admission: RE | Admit: 2016-02-09 | Payer: Commercial Managed Care - HMO | Source: Ambulatory Visit

## 2016-02-09 ENCOUNTER — Encounter (HOSPITAL_COMMUNITY): Payer: Self-pay

## 2016-02-09 ENCOUNTER — Ambulatory Visit (HOSPITAL_COMMUNITY)
Admission: RE | Admit: 2016-02-09 | Discharge: 2016-02-09 | Disposition: A | Discharge status: Home / Self Care | Payer: Commercial Managed Care - HMO | Source: Ambulatory Visit | Attending: Cardiovascular Disease | Admitting: Cardiovascular Disease

## 2016-02-09 ENCOUNTER — Other Ambulatory Visit (HOSPITAL_COMMUNITY): Payer: Commercial Managed Care - HMO

## 2016-02-09 DIAGNOSIS — K219 Gastro-esophageal reflux disease without esophagitis: Secondary | ICD-10-CM | POA: Insufficient documentation

## 2016-02-09 DIAGNOSIS — E785 Hyperlipidemia, unspecified: Secondary | ICD-10-CM | POA: Diagnosis not present

## 2016-02-09 DIAGNOSIS — R0602 Shortness of breath: Secondary | ICD-10-CM | POA: Insufficient documentation

## 2016-02-09 DIAGNOSIS — I2510 Atherosclerotic heart disease of native coronary artery without angina pectoris: Secondary | ICD-10-CM

## 2016-02-09 DIAGNOSIS — I119 Hypertensive heart disease without heart failure: Secondary | ICD-10-CM | POA: Insufficient documentation

## 2016-02-09 HISTORY — DX: Essential (primary) hypertension: I10

## 2016-02-09 LAB — NM MYOCAR MULTI W/SPECT W/WALL MOTION / EF
CHL CUP NUCLEAR SRS: 1
LVDIAVOL: 78 mL (ref 62–150)
LVSYSVOL: 22 mL
Peak HR: 83 {beats}/min
RATE: 0.39
Rest HR: 44 {beats}/min
SDS: 0
SSS: 1
TID: 1.08

## 2016-02-09 MED ORDER — TECHNETIUM TC 99M TETROFOSMIN IV KIT
10.0000 | PACK | Freq: Once | INTRAVENOUS | Status: AC | PRN
  Administered 2016-02-09: 10.1 via INTRAVENOUS

## 2016-02-09 MED ORDER — TECHNETIUM TC 99M TETROFOSMIN IV KIT
30.0000 | PACK | Freq: Once | INTRAVENOUS | Status: AC | PRN
  Administered 2016-02-09: 31 via INTRAVENOUS

## 2016-02-09 MED ORDER — REGADENOSON 0.4 MG/5ML IV SOLN
INTRAVENOUS | Status: AC
  Administered 2016-02-09: 0.4 mg
  Filled 2016-02-09: qty 5

## 2016-02-09 NOTE — Progress Notes (Signed)
*  PRELIMINARY RESULTS* Echocardiogram 2D Echocardiogram has been performed.  Samuel Germany 02/09/2016, 10:05 AM

## 2016-03-12 ENCOUNTER — Ambulatory Visit: Payer: Commercial Managed Care - HMO | Admitting: Cardiovascular Disease

## 2016-04-30 ENCOUNTER — Ambulatory Visit (INDEPENDENT_AMBULATORY_CARE_PROVIDER_SITE_OTHER): Payer: Commercial Managed Care - HMO | Admitting: Cardiovascular Disease

## 2016-04-30 ENCOUNTER — Encounter: Payer: Self-pay | Admitting: Cardiovascular Disease

## 2016-04-30 VITALS — BP 134/78 | HR 65 | Ht 66.0 in | Wt 268.0 lb

## 2016-04-30 DIAGNOSIS — R600 Localized edema: Secondary | ICD-10-CM | POA: Diagnosis not present

## 2016-04-30 DIAGNOSIS — I2510 Atherosclerotic heart disease of native coronary artery without angina pectoris: Secondary | ICD-10-CM | POA: Diagnosis not present

## 2016-04-30 DIAGNOSIS — Z136 Encounter for screening for cardiovascular disorders: Secondary | ICD-10-CM

## 2016-04-30 DIAGNOSIS — IMO0001 Reserved for inherently not codable concepts without codable children: Secondary | ICD-10-CM

## 2016-04-30 DIAGNOSIS — R5383 Other fatigue: Secondary | ICD-10-CM

## 2016-04-30 NOTE — Patient Instructions (Signed)
Medication Instructions:  Continue all current medications.  Labwork: none  Testing/Procedures: none  Follow-Up: As needed.    Any Other Special Instructions Will Be Listed Below (If Applicable).  If you need a refill on your cardiac medications before your next appointment, please call your pharmacy.  

## 2016-04-30 NOTE — Progress Notes (Signed)
SUBJECTIVE: The patient returns for follow-up after undergoing cardiovascular testing performed for the evaluation of fatigue and coronary artery calcifications as well as leg edema.  Normal nuclear stress test, 02/09/2016.  Echocardiogram vigorous left ventricular systolic function, LVEF Q000111Q.  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 His legs feel much better.  Denies chest pain and SOB.   Review of Systems: As per "subjective", otherwise negative.  Allergies  Allergen Reactions  . Cialis [Tadalafil]   . Viagra [Sildenafil Citrate] Other (See Comments)    heartburn    Current Outpatient Prescriptions  Medication Sig Dispense Refill  . amLODipine (NORVASC) 5 MG tablet Take 1 tablet (5 mg total) by mouth daily. 90 tablet 3  . aspirin 81 MG tablet Take 81 mg by mouth daily.      . benazepril (LOTENSIN) 10 MG tablet Take 1 tablet (10 mg total) by mouth daily. 90 tablet 3  . Cholecalciferol (VITAMIN D-3) 5000 UNITS TABS Take 1 tablet by mouth daily.      . Garlic [id number] MG CAPS Take 2 capsules by mouth daily.    Marland Kitchen lactulose (CHRONULAC) 10 GM/15ML solution Take 15 mLs (10 g total) by mouth daily as needed for mild constipation. 360 mL 1  . NON FORMULARY Take 1 tablet by mouth daily. Equate allergy tablet    . Omega-3 Fatty Acids (FISH OIL) 1000 MG CAPS Take 2 capsules by mouth daily.    Marland Kitchen omeprazole (PRILOSEC) 20 MG capsule Take 1 capsule (20 mg total) by mouth daily. 90 capsule 3  . pravastatin (PRAVACHOL) 40 MG tablet Take 1 tablet (40 mg total) by mouth daily. 90 tablet 3  . vardenafil (LEVITRA) 20 MG tablet Take 20 mg by mouth daily as needed.      . vitamin C (ASCORBIC ACID) 500 MG tablet Take 500 mg by mouth daily.     No current facility-administered medications for this visit.     Past Medical History:  Diagnosis Date  . Colon polyp   . GERD (gastroesophageal reflux disease)   . Hemorrhoid   .  Hyperplasia of prostate   . Hypertension   . Other and unspecified hyperlipidemia   . Other testicular hypofunction   . Sinus bradycardia     Past Surgical History:  Procedure Laterality Date  . APPENDECTOMY      Social History   Social History  . Marital status: Married    Spouse name: N/A  . Number of children: N/A  . Years of education: N/A   Occupational History  . Not on file.   Social History Main Topics  . Smoking status: Light Tobacco Smoker    Types: Cigars  . Smokeless tobacco: Current User    Types: Chew  . Alcohol use Yes     Comment: occasional  . Drug use: No  . Sexual activity: Not on file   Other Topics Concern  . Not on file   Social History Narrative  . No narrative on file     Vitals:   04/30/16 1559  BP: 134/78  Pulse: 65  SpO2: 95%  Weight: 268 lb (121.6 kg)  Height: 5\' 6"  (1.676 m)    PHYSICAL EXAM General: NAD Neck: No JVD, no thyromegaly or thyroid nodule.  Lungs: Clear to auscultation bilaterally with normal respiratory effort. CV: Nondisplaced PMI. Regular rate and rhythm, normal S1/S2, no S3/S4, no murmur.  1+ pitting pretibial edema b/l.    Abdomen: Firm, obese.  Skin: Intact without lesions or rashes.  Neurologic: Alert and oriented x 3.  Psych: Normal affect. Extremities: No clubbing or cyanosis.  HEENT: Normal.     ECG: Most recent ECG reviewed.      ASSESSMENT AND PLAN: 1. Fatigue, CAD/coronary artery calcifications: Normal nuclear stress test, 02/09/2016. Echocardiogram vigorous left ventricular systolic function, LVEF Q000111Q. Recommended he continue ASA and statin.  2. Bilateral leg edema: Echocardiogram vigorous left ventricular systolic function, LVEF Q000111Q. Likely related to obesity and venous varicosities. Could consider compression stockings.  Dispo: fu prn  Kate Sable, M.D., F.A.C.C.

## 2016-05-17 ENCOUNTER — Ambulatory Visit (INDEPENDENT_AMBULATORY_CARE_PROVIDER_SITE_OTHER): Payer: Commercial Managed Care - HMO | Admitting: Family Medicine

## 2016-05-17 ENCOUNTER — Encounter: Payer: Self-pay | Admitting: Family Medicine

## 2016-05-17 VITALS — BP 119/70 | HR 53 | Temp 97.5°F | Ht 66.0 in | Wt 265.0 lb

## 2016-05-17 DIAGNOSIS — E349 Endocrine disorder, unspecified: Secondary | ICD-10-CM

## 2016-05-17 DIAGNOSIS — I10 Essential (primary) hypertension: Secondary | ICD-10-CM

## 2016-05-17 DIAGNOSIS — K219 Gastro-esophageal reflux disease without esophagitis: Secondary | ICD-10-CM

## 2016-05-17 DIAGNOSIS — E7800 Pure hypercholesterolemia, unspecified: Secondary | ICD-10-CM | POA: Diagnosis not present

## 2016-05-17 DIAGNOSIS — E559 Vitamin D deficiency, unspecified: Secondary | ICD-10-CM | POA: Diagnosis not present

## 2016-05-17 DIAGNOSIS — N400 Enlarged prostate without lower urinary tract symptoms: Secondary | ICD-10-CM | POA: Diagnosis not present

## 2016-05-17 DIAGNOSIS — G629 Polyneuropathy, unspecified: Secondary | ICD-10-CM | POA: Diagnosis not present

## 2016-05-17 LAB — URINALYSIS, COMPLETE
Bilirubin, UA: NEGATIVE
GLUCOSE, UA: NEGATIVE
Ketones, UA: NEGATIVE
LEUKOCYTES UA: NEGATIVE
Nitrite, UA: NEGATIVE
PH UA: 6.5 (ref 5.0–7.5)
PROTEIN UA: NEGATIVE
RBC, UA: NEGATIVE
Specific Gravity, UA: 1.02 (ref 1.005–1.030)
UUROB: 1 mg/dL (ref 0.2–1.0)

## 2016-05-17 LAB — MICROSCOPIC EXAMINATION
BACTERIA UA: NONE SEEN
Epithelial Cells (non renal): NONE SEEN /hpf (ref 0–10)
RBC, UA: NONE SEEN /hpf (ref 0–?)

## 2016-05-17 MED ORDER — LACTULOSE 10 GM/15ML PO SOLN: 10.0000 g | Freq: Every day | ORAL | 1 refills | Status: DC | PRN

## 2016-05-17 NOTE — Progress Notes (Signed)
Subjective:    Patient ID: [name] [name], male    DOB: 1946/02/07, 70 y.o.   MRN: [id number]  HPI Pt here for follow up and management of chronic medical problems which includes hyperlipidemia and hypertension. He is taking medications regularly.The patient just retired from his job about 2 weeks ago. He's very happy with his retirement. Since he was last seen he has been seen by the cardiologist and had an echocardiogram which was good. This was done because of the findings of coronary artery calcification. According to the patient no further testing was planned after the normal echocardiogram. There was an order and therefore myocardial perfusion study but he says that the cardiologist said he did not need to have that. The patient denies any chest pain pressure or palpitations. He denies any shortness of breath. He denies any trouble with his stomach including nausea vomiting reflux symptoms blood in the stool or black tarry bowel movements. He says that the omeprazole controls his reflux well. He's passing his water well and goes to the bathroom frequently at nighttime. He understands that he has morbid obesity with a BMI of 43. The patient refuses to get a flu shot. He does have a history of testosterone deficiency and does not take anything for this. He uses Levitra for sexual function. The patient indicates that since he is retired and gotten off the concrete floor that his legs have been feeling better as far as his peripheral neuropathy is concerned.    Patient Active Problem List   Diagnosis Date Noted  . B12 deficiency 12/31/2015  . Hyperlipidemia 07/01/2014  . Erectile dysfunction 02/28/2014  . Vitamin D deficiency 12/14/2012  . Testosterone deficiency   . Benign essential hypertension antepartum   . Sinus bradycardia   . GERD (gastroesophageal reflux disease)   . Colon polyp history   . BPH (benign prostatic hyperplasia)    Outpatient Encounter Prescriptions as of 05/17/2016    Medication Sig  . amLODipine (NORVASC) 5 MG tablet Take 1 tablet (5 mg total) by mouth daily.  Marland Kitchen aspirin 81 MG tablet Take 81 mg by mouth daily.    . benazepril (LOTENSIN) 10 MG tablet Take 1 tablet (10 mg total) by mouth daily.  . Cholecalciferol (VITAMIN D-3) 5000 UNITS TABS Take 1 tablet by mouth daily.    . Garlic 1021 MG CAPS Take 2 capsules by mouth daily.  Marland Kitchen lactulose (CHRONULAC) 10 GM/15ML solution Take 15 mLs (10 g total) by mouth daily as needed for mild constipation.  . NON FORMULARY Take 1 tablet by mouth daily. Equate allergy tablet  . Omega-3 Fatty Acids (FISH OIL) 1000 MG CAPS Take 2 capsules by mouth daily.  Marland Kitchen omeprazole (PRILOSEC) 20 MG capsule Take 1 capsule (20 mg total) by mouth daily.  . pravastatin (PRAVACHOL) 40 MG tablet Take 1 tablet (40 mg total) by mouth daily.  . vardenafil (LEVITRA) 20 MG tablet Take 20 mg by mouth daily as needed.    . vitamin C (ASCORBIC ACID) 500 MG tablet Take 500 mg by mouth daily.   No facility-administered encounter medications on file as of 05/17/2016.       Review of Systems  Constitutional: Negative.   HENT: Negative.   Eyes: Negative.   Respiratory: Negative.   Cardiovascular: Negative.   Gastrointestinal: Negative.   Endocrine: Negative.   Genitourinary: Negative.   Musculoskeletal: Negative.   Skin: Negative.   Allergic/Immunologic: Negative.   Neurological: Negative.   Hematological: Negative.   Psychiatric/Behavioral: Negative.  Objective:   Physical Exam  Constitutional: He is oriented to person, place, and time. He appears well-developed and well-nourished. No distress.  Pleasant and alert  HENT:  Head: Normocephalic and atraumatic.  Right Ear: External ear normal.  Left Ear: External ear normal.  Nose: Nose normal.  Mouth/Throat: Oropharynx is clear and moist. No oropharyngeal exudate.  Eyes: Conjunctivae and EOM are normal. Pupils are equal, round, and reactive to light. Right eye exhibits no  discharge. Left eye exhibits no discharge. No scleral icterus.  Neck: Normal range of motion. Neck supple. No thyromegaly present.  No bruits thyromegaly or anterior cervical adenopathy  Cardiovascular: Normal rate, regular rhythm, normal heart sounds and intact distal pulses.   No murmur heard. The heart has a regular rate and rhythm at 72/m  Pulmonary/Chest: Effort normal and breath sounds normal. No respiratory distress. He has no wheezes. He has no rales. He exhibits no tenderness.  Clear anteriorly and posteriorly and no axillary adenopathy  Abdominal: Soft. Bowel sounds are normal. He exhibits no mass. There is no tenderness. There is no rebound and no guarding.  Abdominal obesity without masses or organ enlargement bruits or tenderness  Genitourinary: Rectum normal and penis normal.  Genitourinary Comments: The prostate is slightly enlarged but soft and smooth. There are no rectal masses. The patient is aware that he will need another colonoscopy in 2021. No inguinal hernias palpable in the external genitalia were within normal limits  Musculoskeletal: Normal range of motion. He exhibits no edema.  Lymphadenopathy:    He has no cervical adenopathy.  Neurological: He is alert and oriented to person, place, and time. He has normal reflexes. No cranial nerve deficit.  Skin: Skin is warm and dry. No rash noted.  Psychiatric: He has a normal mood and affect. His behavior is normal. Judgment and thought content normal.  Nursing note and vitals reviewed.  BP 119/70 (BP Location: Left Arm)   Pulse (!) 53   Temp 97.5 F (36.4 C) (Oral)   Ht '5\' 6"'  (1.676 m)   Wt 265 lb (120.2 kg)   BMI 42.77 kg/m         Assessment & Plan:  1. Essential hypertension -The blood pressure is good today and he will continue with current treatment - BMP8+EGFR - CBC with Differential/Platelet - Hepatic function panel  2. Pure hypercholesterolemia -Continue with aggressive therapeutic lifestyle changes  and current treatment pending results of lab work - CBC with Differential/Platelet - Lipid panel  3. Vitamin D deficiency -Continue with vitamin D replacement - CBC with Differential/Platelet - VITAMIN D 25 Hydroxy (Vit-D Deficiency, Fractures)  4. Benign prostatic hyperplasia, unspecified whether lower urinary tract symptoms present -The prostate remains slightly enlarged. There were no lumps or masses. The patient does have nocturia. He denies any symptoms otherwise. - CBC with Differential/Platelet - PSA, total and free - Urinalysis, Complete  5. Testosterone deficiency -The patient is aware of this and has declined treatment for this. - CBC with Differential/Platelet - PSA, total and free - Urinalysis, Complete  6. Gastroesophageal reflux disease without esophagitis -He is taking omeprazole 1 daily and he will try the ranitidine 150 mg twice daily before breakfast and supper and understands if this does not work to go back on the omeprazole - CBC with Differential/Platelet - Hepatic function panel  7. Morbid obesity ([place]) -Continue to make every effort to lose weight and get more exercise. He mentioned to me zinc in about getting involved in a exercise program and I  encouraged him to do this.  8. 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  Meds ordered this encounter  Medications  . lactulose (CHRONULAC) 10 GM/15ML solution    Sig: Take 15 mLs (10 g total) by mouth daily as needed for mild constipation.    Dispense:  360 mL    Refill:  1   Patient Instructions                       Medicare Annual Wellness Visit  [facility] and the medical providers at [facility] strive to bring you the best medical care.  In doing so we not only want to address your current medical conditions and concerns but also to detect new conditions early and prevent illness, disease and health-related problems.     Medicare offers a yearly Wellness Visit which allows our clinical staff to assess your need for preventative services including immunizations, lifestyle education, counseling to decrease risk of preventable diseases and screening for fall risk and other medical concerns.    This visit is provided free of charge (no copay) for all Medicare recipients. The clinical pharmacists at [facility] have begun to conduct these Wellness Visits which will also include a thorough review of all your medications.    As you primary medical provider recommend that you make an appointment for your Annual Wellness Visit if you have not done so already this year.  You may set up this appointment before you leave today or you may call back [phone]) and schedule an appointment.  Please make sure when you call that you mention that you are scheduling your Annual Wellness Visit with the clinical pharmacist so that the appointment may be made for the proper length of time.     Continue current medications. Continue good therapeutic lifestyle changes which include good diet and exercise. Fall precautions discussed with patient. If an FOBT was given today- please return it to our front desk. If you are over 28 years old - you may need Prevnar 52 or the adult Pneumonia vaccine.  **Flu shots are available--- please call and schedule a FLU-CLINIC appointment**  After your visit with [place] today you will receive a survey in the mail or online from [employer] regarding your care with [place]. Please take a moment to fill this out. Your feedback is very important to [place] as you can help [place] better understand your patient needs as well as improve your experience and satisfaction. WE CARE ABOUT YOU!!!   Watch sodium intake Continue to drink plenty of fluids Become involved in an exercise program Try ranitidine 150 mg twice daily before breakfast and supper and hold the omeprazole. If the reflux and heartburn  become worse with this go back on the omeprazole.  Arrie Senate MD

## 2016-05-17 NOTE — Patient Instructions (Addendum)
Medicare Annual Wellness Visit  [facility] and the medical providers at [facility] strive to bring you the best medical care.  In doing so we not only want to address your current medical conditions and concerns but also to detect new conditions early and prevent illness, disease and health-related problems.    Medicare offers a yearly Wellness Visit which allows our clinical staff to assess your need for preventative services including immunizations, lifestyle education, counseling to decrease risk of preventable diseases and screening for fall risk and other medical concerns.    This visit is provided free of charge (no copay) for all Medicare recipients. The clinical pharmacists at [facility] have begun to conduct these Wellness Visits which will also include a thorough review of all your medications.    As you primary medical provider recommend that you make an appointment for your Annual Wellness Visit if you have not done so already this year.  You may set up this appointment before you leave today or you may call back [phone] and schedule an appointment.  Please make sure when you call that you mention that you are scheduling your Annual Wellness Visit with the clinical pharmacist so that the appointment may be made for the proper length of time.     Continue current medications. Continue good therapeutic lifestyle changes which include good diet and exercise. Fall precautions discussed with patient. If an FOBT was given today- please return it to our front desk. If you are over 42 years old - you may need Prevnar 77 or the adult Pneumonia vaccine.  **Flu shots are available--- please call and schedule a FLU-CLINIC appointment**  After your visit with [place] today you will receive a survey in the mail or online from [employer] regarding your care with [place]. Please take a moment to fill this out. Your feedback is very  important to [place] as you can help [place] better understand your patient needs as well as improve your experience and satisfaction. WE CARE ABOUT YOU!!!   Watch sodium intake Continue to drink plenty of fluids Become involved in an exercise program Try ranitidine 150 mg twice daily before breakfast and supper and hold the omeprazole. If the reflux and heartburn become worse with this go back on the omeprazole.

## 2016-05-18 LAB — HEPATIC FUNCTION PANEL
ALBUMIN: 4.1 g/dL (ref 3.5–4.8)
ALK PHOS: 53 IU/L (ref 39–117)
ALT: 24 IU/L (ref 0–44)
AST: 23 IU/L (ref 0–40)
BILIRUBIN, DIRECT: 0.15 mg/dL (ref 0.00–0.40)
Bilirubin Total: 0.5 mg/dL (ref 0.0–1.2)
TOTAL PROTEIN: 6.5 g/dL (ref 6.0–8.5)

## 2016-05-18 LAB — LIPID PANEL
CHOLESTEROL TOTAL: 152 mg/dL (ref 100–199)
Chol/HDL Ratio: 3.7 ratio units (ref 0.0–5.0)
HDL: 41 mg/dL (ref >39)
LDL CALC: 75 mg/dL (ref 0–99)
Triglycerides: 180 mg/dL — ABNORMAL HIGH (ref 0–149)
VLDL CHOLESTEROL CAL: 36 mg/dL (ref 5–40)

## 2016-05-18 LAB — CBC WITH DIFFERENTIAL/PLATELET
Basophils Absolute: 0.1 10*3/uL (ref 0.0–0.2)
Basos: 1 %
EOS (ABSOLUTE): 0.2 10*3/uL (ref 0.0–0.4)
EOS: 4 %
HEMATOCRIT: 40.9 % (ref 37.5–51.0)
HEMOGLOBIN: 14.6 g/dL (ref 12.6–17.7)
IMMATURE GRANS (ABS): 0 10*3/uL (ref 0.0–0.1)
Immature Granulocytes: 0 %
LYMPHS ABS: 2.3 10*3/uL (ref 0.7–3.1)
LYMPHS: 34 %
MCH: 34 pg — AB (ref 26.6–33.0)
MCHC: 35.7 g/dL (ref 31.5–35.7)
MCV: 95 fL (ref 79–97)
MONOCYTES: 7 %
Monocytes Absolute: 0.5 10*3/uL (ref 0.1–0.9)
NEUTROS ABS: 3.6 10*3/uL (ref 1.4–7.0)
Neutrophils: 54 %
Platelets: 196 10*3/uL (ref 150–379)
RBC: 4.29 x10E6/uL (ref 4.14–5.80)
RDW: 12.7 % (ref 12.3–15.4)
WBC: 6.7 10*3/uL (ref 3.4–10.8)

## 2016-05-18 LAB — BMP8+EGFR
BUN / CREAT RATIO: 18 (ref 10–24)
BUN: 17 mg/dL (ref 8–27)
CO2: 27 mmol/L (ref 18–29)
CREATININE: 0.95 mg/dL (ref 0.76–1.27)
Calcium: 9.8 mg/dL (ref 8.6–10.2)
Chloride: 99 mmol/L (ref 96–106)
GFR, EST AFRICAN AMERICAN: 93 mL/min/{1.73_m2} (ref >59)
GFR, EST NON AFRICAN AMERICAN: 80 mL/min/{1.73_m2} (ref >59)
GLUCOSE: 116 mg/dL — AB (ref 65–99)
Potassium: 5.2 mmol/L (ref 3.5–5.2)
Sodium: 142 mmol/L (ref 134–144)

## 2016-05-18 LAB — PSA, TOTAL AND FREE
PROSTATE SPECIFIC AG, SERUM: 2.7 ng/mL (ref 0.0–4.0)
PSA FREE PCT: 14.4 %
PSA FREE: 0.39 ng/mL

## 2016-05-18 LAB — VITAMIN D 25 HYDROXY (VIT D DEFICIENCY, FRACTURES): VIT D 25 HYDROXY: 71.6 ng/mL (ref 30.0–100.0)

## 2016-10-21 ENCOUNTER — Ambulatory Visit (INDEPENDENT_AMBULATORY_CARE_PROVIDER_SITE_OTHER): Payer: Medicare HMO | Admitting: Family Medicine

## 2016-10-21 ENCOUNTER — Encounter: Payer: Self-pay | Admitting: Family Medicine

## 2016-10-21 VITALS — BP 137/82 | HR 59 | Temp 97.2°F | Ht 66.0 in | Wt 266.0 lb

## 2016-10-21 DIAGNOSIS — I10 Essential (primary) hypertension: Secondary | ICD-10-CM

## 2016-10-21 DIAGNOSIS — E349 Endocrine disorder, unspecified: Secondary | ICD-10-CM

## 2016-10-21 DIAGNOSIS — K219 Gastro-esophageal reflux disease without esophagitis: Secondary | ICD-10-CM

## 2016-10-21 DIAGNOSIS — E7800 Pure hypercholesterolemia, unspecified: Secondary | ICD-10-CM

## 2016-10-21 DIAGNOSIS — N400 Enlarged prostate without lower urinary tract symptoms: Secondary | ICD-10-CM

## 2016-10-21 DIAGNOSIS — E559 Vitamin D deficiency, unspecified: Secondary | ICD-10-CM | POA: Diagnosis not present

## 2016-10-21 NOTE — Progress Notes (Signed)
Subjective:    Patient ID: [name] [name], male    DOB: [birth year], 72 y.o.   MRN: [id number]  HPI Pt here for follow up and management of chronic medical problems which includes hyperlipidemia and hypertension. He is taking medication regularly.The patient is doing well overall. He will get lab work today to follow-up on his problems of hypertension hyperlipidemia and GERD. He has no specific complaints. He comes to the visit today with his wife. His weight is up 1 pound since the last visit. The patient denies any chest pain pressure tightness or shortness of breath. He denies any problems with heartburn indigestion nausea vomiting diarrhea or blood in the stool. He does complain of hemorrhoid issues which have been going on for a long time. He was encouraged to drink plenty of water and fluids and stay well hydrated. He's passing his water without problems. It is of note that he is been retired from his job for 6 months. His wife says that he is staying active and exercising at home. He    Patient Active Problem List   Diagnosis Date Noted  . B12 deficiency 12/31/2015  . Hyperlipidemia 07/01/2014  . Erectile dysfunction 02/28/2014  . Vitamin D deficiency 12/14/2012  . Testosterone deficiency   . Benign essential hypertension antepartum   . Sinus bradycardia   . GERD (gastroesophageal reflux disease)   . Colon polyp history   . BPH (benign prostatic hyperplasia)    Outpatient Encounter Prescriptions as of 10/21/2016  Medication Sig  . amLODipine (NORVASC) 5 MG tablet Take 1 tablet (5 mg total) by mouth daily.  Marland Kitchen aspirin 81 MG tablet Take 81 mg by mouth daily.    . benazepril (LOTENSIN) 10 MG tablet Take 1 tablet (10 mg total) by mouth daily.  . Cholecalciferol (VITAMIN D-3) 5000 UNITS TABS Take 1 tablet by mouth daily.    . Garlic 7628 MG CAPS Take 2 capsules by mouth daily.  Marland Kitchen lactulose (CHRONULAC) 10 GM/15ML solution Take 15 mLs (10 g total) by mouth daily as needed for mild  constipation.  . NON FORMULARY Take 1 tablet by mouth daily. Equate allergy tablet  . Omega-3 Fatty Acids (FISH OIL) 1000 MG CAPS Take 2 capsules by mouth daily.  Marland Kitchen omeprazole (PRILOSEC) 20 MG capsule Take 1 capsule (20 mg total) by mouth daily.  . pravastatin (PRAVACHOL) 40 MG tablet Take 1 tablet (40 mg total) by mouth daily.  . vardenafil (LEVITRA) 20 MG tablet Take 20 mg by mouth daily as needed.    . vitamin C (ASCORBIC ACID) 500 MG tablet Take 500 mg by mouth daily.   No facility-administered encounter medications on file as of 10/21/2016.       Review of Systems  Constitutional: Negative.   HENT: Negative.   Eyes: Negative.   Respiratory: Negative.   Cardiovascular: Negative.   Gastrointestinal: Negative.   Endocrine: Negative.   Genitourinary: Negative.   Musculoskeletal: Negative.   Skin: Negative.   Allergic/Immunologic: Negative.   Neurological: Negative.   Hematological: Negative.   Psychiatric/Behavioral: Negative.        Objective:   Physical Exam  Constitutional: He is oriented to person, place, and time. He appears well-developed and well-nourished. No distress.  HENT:  Head: Normocephalic and atraumatic.  Right Ear: External ear normal.  Left Ear: External ear normal.  Mouth/Throat: Oropharynx is clear and moist. No oropharyngeal exudate.  Slight nasal congestion and turbinate swelling bilaterally  Eyes: Conjunctivae and EOM are normal. Pupils are equal,  round, and reactive to light. Right eye exhibits no discharge. Left eye exhibits no discharge. No scleral icterus.  Neck: Normal range of motion. Neck supple. No thyromegaly present.  No bruits thyromegaly or anterior cervical adenopathy  Cardiovascular: Normal rate, regular rhythm, normal heart sounds and intact distal pulses.  Exam reveals no gallop and no friction rub.   No murmur heard. The heart is regular at 60/m  Pulmonary/Chest: Effort normal and breath sounds normal. No respiratory distress. He  has no wheezes. He has no rales. He exhibits no tenderness.  Lungs are clear anteriorly and posteriorly and there is no axillary adenopathy  Abdominal: Soft. Bowel sounds are normal. He exhibits no mass. There is no tenderness. There is no rebound and no guarding.  Abdominal obesity without masses tenderness or organ enlargement  Musculoskeletal: Normal range of motion. He exhibits no edema.  Lymphadenopathy:    He has no cervical adenopathy.  Neurological: He is alert and oriented to person, place, and time. He has normal reflexes. No cranial nerve deficit.  Skin: Skin is warm and dry. No rash noted.  Psychiatric: He has a normal mood and affect. His behavior is normal. Judgment and thought content normal.  Nursing note and vitals reviewed.   BP 137/82 (BP Location: Right Arm)   Pulse (!) 59   Temp 97.2 F (36.2 C) (Oral)   Ht '5\' 6"'  (1.676 m)   Wt 266 lb (120.7 kg)   BMI 42.93 kg/m        Assessment & Plan:  1. Pure hypercholesterolemia -Continue with current treatment and aggressive therapeutic lifestyle changes - CBC with Differential/Platelet - NMR, lipoprofile  2. Essential hypertension -The blood pressure is good today and the patient should continue to watch his sodium intake and work at making all efforts to lose weight. - BMP8+EGFR - CBC with Differential/Platelet - Hepatic function panel  3. Vitamin D deficiency -Continue current treatment pending results of lab work - CBC with Differential/Platelet - VITAMIN D 25 Hydroxy (Vit-D Deficiency, Fractures)  4. Benign prostatic hyperplasia, unspecified whether lower urinary tract symptoms present -No symptoms with voiding. - CBC with Differential/Platelet  5. Testosterone deficiency -No complaints with in crease fatigue or sexual function. Patient has refused treatment for this condition in the past. - CBC with Differential/Platelet  6. Gastroesophageal reflux disease without esophagitis -Continue current  treatment and watch diet closely - CBC with Differential/Platelet - Hepatic function panel  7. Morbid obesity ([place]) -He will continue with his exercise regimen and make all efforts to lose as much weight as possible.  Patient Instructions                       Medicare Annual Wellness Visit  [facility] and the medical providers at [facility] strive to bring you the best medical care.  In doing so we not only want to address your current medical conditions and concerns but also to detect new conditions early and prevent illness, disease and health-related problems.    Medicare offers a yearly Wellness Visit which allows our clinical staff to assess your need for preventative services including immunizations, lifestyle education, counseling to decrease risk of preventable diseases and screening for fall risk and other medical concerns.    This visit is provided free of charge (no copay) for all Medicare recipients. The clinical pharmacists at [facility] have begun to conduct these Wellness Visits which will also include a thorough review of all your  medications.    As you primary medical provider recommend that you make an appointment for your Annual Wellness Visit if you have not done so already this year.  You may set up this appointment before you leave today or you may call back ([phone]) and schedule an appointment.  Please make sure when you call that you mention that you are scheduling your Annual Wellness Visit with the clinical pharmacist so that the appointment may be made for the proper length of time.     Continue current medications. Continue good therapeutic lifestyle changes which include good diet and exercise. Fall precautions discussed with patient. If an FOBT was given today- please return it to our front desk. If you are over 65 years old - you may need Prevnar 53 or the adult Pneumonia vaccine.  **Flu shots are available---  please call and schedule a FLU-CLINIC appointment**  After your visit with [place] today you will receive a survey in the mail or online from [employer] regarding your care with [place]. Please take a moment to fill this out. Your feedback is very important to [place] as you can help [place] better understand your patient needs as well as improve your experience and satisfaction. WE CARE ABOUT YOU!!!   Continue current medications and as aggressive therapeutic lifestyle changes as possible Continue with exercise program as this is good for you Scheduling your self an eye exam since it has been over 2 years since you had one make sure that we get a copy of the report from the eye doctor Continue to work aggressively through your exercise program on your weight  Arrie Senate MD

## 2016-10-21 NOTE — Patient Instructions (Addendum)
Medicare Annual Wellness Visit  [facility] and the medical providers at [facility] strive to bring you the best medical care.  In doing so we not only want to address your current medical conditions and concerns but also to detect new conditions early and prevent illness, disease and health-related problems.    Medicare offers a yearly Wellness Visit which allows our clinical staff to assess your need for preventative services including immunizations, lifestyle education, counseling to decrease risk of preventable diseases and screening for fall risk and other medical concerns.    This visit is provided free of charge (no copay) for all Medicare recipients. The clinical pharmacists at [facility] have begun to conduct these Wellness Visits which will also include a thorough review of all your medications.    As you primary medical provider recommend that you make an appointment for your Annual Wellness Visit if you have not done so already this year.  You may set up this appointment before you leave today or you may call back ([phone]) and schedule an appointment.  Please make sure when you call that you mention that you are scheduling your Annual Wellness Visit with the clinical pharmacist so that the appointment may be made for the proper length of time.     Continue current medications. Continue good therapeutic lifestyle changes which include good diet and exercise. Fall precautions discussed with patient. If an FOBT was given today- please return it to our front desk. If you are over 5 years old - you may need Prevnar 38 or the adult Pneumonia vaccine.  **Flu shots are available--- please call and schedule a FLU-CLINIC appointment**  After your visit with [place] today you will receive a survey in the mail or online from [employer] regarding your care with [place]. Please take a moment to fill this out. Your feedback is very  important to [place] as you can help [place] better understand your patient needs as well as improve your experience and satisfaction. WE CARE ABOUT YOU!!!   Continue current medications and as aggressive therapeutic lifestyle changes as possible Continue with exercise program as this is good for you Scheduling your self an eye exam since it has been over 2 years since you had one make sure that we get a copy of the report from the eye doctor Continue to work aggressively through your exercise program on your weight

## 2016-10-22 LAB — BMP8+EGFR
BUN / CREAT RATIO: 13 (ref 10–24)
BUN: 13 mg/dL (ref 8–27)
CHLORIDE: 99 mmol/L (ref 96–106)
CO2: 27 mmol/L (ref 18–29)
Calcium: 9.1 mg/dL (ref 8.6–10.2)
Creatinine, Ser: 1.04 mg/dL (ref 0.76–1.27)
GFR, EST AFRICAN AMERICAN: 83 mL/min/{1.73_m2} (ref >59)
GFR, EST NON AFRICAN AMERICAN: 71 mL/min/{1.73_m2} (ref >59)
Glucose: 106 mg/dL — ABNORMAL HIGH (ref 65–99)
Potassium: 4 mmol/L (ref 3.5–5.2)
Sodium: 141 mmol/L (ref 134–144)

## 2016-10-22 LAB — HEPATIC FUNCTION PANEL
ALK PHOS: 45 IU/L (ref 39–117)
ALT: 18 IU/L (ref 0–44)
AST: 25 IU/L (ref 0–40)
Albumin: 4 g/dL (ref 3.5–4.8)
BILIRUBIN TOTAL: 0.6 mg/dL (ref 0.0–1.2)
Bilirubin, Direct: 0.15 mg/dL (ref 0.00–0.40)
Total Protein: 6.2 g/dL (ref 6.0–8.5)

## 2016-10-22 LAB — CBC WITH DIFFERENTIAL/PLATELET
BASOS ABS: 0 10*3/uL (ref 0.0–0.2)
BASOS: 1 %
EOS (ABSOLUTE): 0.3 10*3/uL (ref 0.0–0.4)
Eos: 4 %
HEMOGLOBIN: 13.5 g/dL (ref 13.0–17.7)
Hematocrit: 41.2 % (ref 37.5–51.0)
IMMATURE GRANS (ABS): 0 10*3/uL (ref 0.0–0.1)
Immature Granulocytes: 0 %
LYMPHS ABS: 2.1 10*3/uL (ref 0.7–3.1)
Lymphs: 25 %
MCH: 31.3 pg (ref 26.6–33.0)
MCHC: 32.8 g/dL (ref 31.5–35.7)
MCV: 95 fL (ref 79–97)
MONOCYTES: 7 %
Monocytes Absolute: 0.6 10*3/uL (ref 0.1–0.9)
NEUTROS ABS: 5.3 10*3/uL (ref 1.4–7.0)
Neutrophils: 63 %
PLATELETS: 179 10*3/uL (ref 150–379)
RBC: 4.32 x10E6/uL (ref 4.14–5.80)
RDW: 13.5 % (ref 12.3–15.4)
WBC: 8.4 10*3/uL (ref 3.4–10.8)

## 2016-10-22 LAB — NMR, LIPOPROFILE
Cholesterol: 137 mg/dL (ref 100–199)
HDL CHOLESTEROL BY NMR: 37 mg/dL — AB (ref >39)
HDL PARTICLE NUMBER: 31.5 umol/L (ref >=30.5)
LDL Particle Number: 1208 nmol/L — ABNORMAL HIGH (ref <1000)
LDL Size: 20 nm (ref >20.5)
LDL-C: 65 mg/dL (ref 0–99)
LP-IR Score: 77 — ABNORMAL HIGH (ref <=45)
SMALL LDL PARTICLE NUMBER: 837 nmol/L — AB (ref <=527)
TRIGLYCERIDES BY NMR: 175 mg/dL — AB (ref 0–149)

## 2016-10-22 LAB — VITAMIN D 25 HYDROXY (VIT D DEFICIENCY, FRACTURES): Vit D, 25-Hydroxy: 70.5 ng/mL (ref 30.0–100.0)

## 2017-03-07 ENCOUNTER — Encounter: Payer: Self-pay | Admitting: Family Medicine

## 2017-03-07 ENCOUNTER — Ambulatory Visit (INDEPENDENT_AMBULATORY_CARE_PROVIDER_SITE_OTHER): Payer: Medicare HMO | Admitting: Family Medicine

## 2017-03-07 ENCOUNTER — Ambulatory Visit (INDEPENDENT_AMBULATORY_CARE_PROVIDER_SITE_OTHER): Payer: Medicare HMO

## 2017-03-07 VITALS — BP 133/76 | HR 49 | Temp 96.8°F | Ht 66.0 in | Wt 260.0 lb

## 2017-03-07 DIAGNOSIS — Z723 Lack of physical exercise: Secondary | ICD-10-CM | POA: Diagnosis not present

## 2017-03-07 DIAGNOSIS — E559 Vitamin D deficiency, unspecified: Secondary | ICD-10-CM | POA: Diagnosis not present

## 2017-03-07 DIAGNOSIS — M25562 Pain in left knee: Secondary | ICD-10-CM

## 2017-03-07 DIAGNOSIS — K219 Gastro-esophageal reflux disease without esophagitis: Secondary | ICD-10-CM

## 2017-03-07 DIAGNOSIS — N400 Enlarged prostate without lower urinary tract symptoms: Secondary | ICD-10-CM

## 2017-03-07 DIAGNOSIS — R001 Bradycardia, unspecified: Secondary | ICD-10-CM

## 2017-03-07 DIAGNOSIS — E7800 Pure hypercholesterolemia, unspecified: Secondary | ICD-10-CM | POA: Diagnosis not present

## 2017-03-07 DIAGNOSIS — I10 Essential (primary) hypertension: Secondary | ICD-10-CM

## 2017-03-07 DIAGNOSIS — M25561 Pain in right knee: Secondary | ICD-10-CM | POA: Diagnosis not present

## 2017-03-07 DIAGNOSIS — I700 Atherosclerosis of aorta: Secondary | ICD-10-CM | POA: Insufficient documentation

## 2017-03-07 MED ORDER — BENAZEPRIL HCL 10 MG PO TABS: 10.0000 mg | ORAL_TABLET | Freq: Every day | ORAL | 3 refills | Status: DC

## 2017-03-07 MED ORDER — PRAVASTATIN SODIUM 40 MG PO TABS: 40.0000 mg | ORAL_TABLET | Freq: Every day | ORAL | 3 refills | Status: DC

## 2017-03-07 MED ORDER — OMEPRAZOLE 20 MG PO CPDR: 20.0000 mg | DELAYED_RELEASE_CAPSULE | Freq: Every day | ORAL | 3 refills | Status: DC

## 2017-03-07 MED ORDER — LACTULOSE 10 GM/15ML PO SOLN: 10.0000 g | Freq: Every day | ORAL | 1 refills | Status: DC | PRN

## 2017-03-07 MED ORDER — AMLODIPINE BESYLATE 5 MG PO TABS: 5.0000 mg | ORAL_TABLET | Freq: Every day | ORAL | 3 refills | Status: DC

## 2017-03-07 NOTE — Patient Instructions (Addendum)
Medicare Annual Wellness Visit  [facility] and the medical providers at [facility] strive to bring you the best medical care.  In doing so we not only want to address your current medical conditions and concerns but also to detect new conditions early and prevent illness, disease and health-related problems.    Medicare offers a yearly Wellness Visit which allows our clinical staff to assess your need for preventative services including immunizations, lifestyle education, counseling to decrease risk of preventable diseases and screening for fall risk and other medical concerns.    This visit is provided free of charge (no copay) for all Medicare recipients. The clinical pharmacists at [facility] have begun to conduct these Wellness Visits which will also include a thorough review of all your medications.    As you primary medical provider recommend that you make an appointment for your Annual Wellness Visit if you have not done so already this year.  You may set up this appointment before you leave today or you may call back ([phone]) and schedule an appointment.  Please make sure when you call that you mention that you are scheduling your Annual Wellness Visit with the clinical pharmacist so that the appointment may be made for the proper length of time.     Continue current medications. Continue good therapeutic lifestyle changes which include good diet and exercise. Fall precautions discussed with patient. If an FOBT was given today- please return it to our front desk. If you are over 44 years old - you may need Prevnar 39 or the adult Pneumonia vaccine.  **Flu shots are available--- please call and schedule a FLU-CLINIC appointment**  After your visit with [place] today you will receive a survey in the mail or online from [employer] regarding your care with [place]. Please take a moment to fill this out. Your feedback is very  important to [place] as you can help [place] better understand your patient needs as well as improve your experience and satisfaction. WE CARE ABOUT YOU!!!   Get more active physically! Drink more water Reduce calorie intake by decreasing breads potatoes and sweets We will call with lab work results as soon as these results become available in addition we will call the chest x-ray results and let you know about the fecal occult blood test card result.

## 2017-03-07 NOTE — Progress Notes (Signed)
Subjective:    Patient ID: [name] [name], male    DOB: 1945/11/01, 71 y.o.   MRN: [id number]  HPI Pt here for follow up and management of chronic medical problems which includes hyperlipidemia and hypertension. He is taking medication regularly.The patient is doing well overall he does complain of a knot on his finger. He is due to get a chest x-ray lab work today and to return an FOBT. His vital signs are stable and his weight is down 6 pounds which is good. Is requesting refills on all of his medicines. The family history is positive for stroke and heart disease and diabetes. The patient is doing well today and comes to the visit with his wife. He is retired and she says he is sitting around more instead of being up and active and I encouraged him to get out of the chair for her to give him more orders to keep him active. They both smile. He denies any chest pain or shortness of breath. He denies any trouble with swallowing heartburn indigestion nausea vomiting diarrhea blood in the stool or black tarry bowel movements. When he had his last colonoscopy this if he did not need another one for 10 years. He's passing his water without problems. He is relaxed and in good spirits. Regarding looked at the palmar aspect of the index finger on the right hand that looks like a little hematoma from some trauma and have reassured him about that.     Patient Active Problem List   Diagnosis Date Noted  . B12 deficiency 12/31/2015  . Hyperlipidemia 07/01/2014  . Erectile dysfunction 02/28/2014  . Vitamin D deficiency 12/14/2012  . Testosterone deficiency   . Benign essential hypertension antepartum   . Sinus bradycardia   . GERD (gastroesophageal reflux disease)   . Colon polyp history   . BPH (benign prostatic hyperplasia)    Outpatient Encounter Prescriptions as of 03/07/2017  Medication Sig  . amLODipine (NORVASC) 5 MG tablet Take 1 tablet (5 mg total) by mouth daily.  Marland Kitchen aspirin 81 MG tablet  Take 81 mg by mouth daily.    . benazepril (LOTENSIN) 10 MG tablet Take 1 tablet (10 mg total) by mouth daily.  . Cholecalciferol (VITAMIN D-3) 5000 UNITS TABS Take 1 tablet by mouth daily.    . Garlic 1638 MG CAPS Take 2 capsules by mouth daily.  Marland Kitchen lactulose (CHRONULAC) 10 GM/15ML solution Take 15 mLs (10 g total) by mouth daily as needed for mild constipation.  . NON FORMULARY Take 1 tablet by mouth daily. Equate allergy tablet  . Omega-3 Fatty Acids (FISH OIL) 1000 MG CAPS Take 2 capsules by mouth daily.  Marland Kitchen omeprazole (PRILOSEC) 20 MG capsule Take 1 capsule (20 mg total) by mouth daily.  . pravastatin (PRAVACHOL) 40 MG tablet Take 1 tablet (40 mg total) by mouth daily.  . vardenafil (LEVITRA) 20 MG tablet Take 20 mg by mouth daily as needed.    . vitamin C (ASCORBIC ACID) 500 MG tablet Take 500 mg by mouth daily.   No facility-administered encounter medications on file as of 03/07/2017.      Review of Systems  Constitutional: Negative.   HENT: Negative.   Eyes: Negative.   Respiratory: Negative.   Cardiovascular: Negative.   Gastrointestinal: Negative.   Endocrine: Negative.   Genitourinary: Negative.   Musculoskeletal: Negative.        Right pointer finger - knot?  Skin: Negative.   Allergic/Immunologic: Negative.   Neurological: Negative.  Hematological: Negative.   Psychiatric/Behavioral: Negative.        Objective:   Physical Exam  Constitutional: He is oriented to person, place, and time. He appears well-developed and well-nourished. No distress.  The patient is pleasant and calm and relaxed. He comes to the visit today with his wife.  HENT:  Head: Normocephalic and atraumatic.  Right Ear: External ear normal.  Left Ear: External ear normal.  Nose: Nose normal.  Mouth/Throat: Oropharynx is clear and moist. No oropharyngeal exudate.  Eyes: Pupils are equal, round, and reactive to light. Conjunctivae and EOM are normal. Right eye exhibits no discharge. Left eye  exhibits no discharge. No scleral icterus.  Neck: Normal range of motion. Neck supple. No thyromegaly present.  No bruits thyromegaly or anterior cervical adenopathy  Cardiovascular: Regular rhythm, normal heart sounds and intact distal pulses.   No murmur heard. The heart has bradycardia at 48-60/m  Pulmonary/Chest: Effort normal and breath sounds normal. No respiratory distress. He has no wheezes. He has no rales. He exhibits no tenderness.  Clear anteriorly and posteriorly no axillary adenopathy  Abdominal: Soft. Bowel sounds are normal. He exhibits no mass. There is no tenderness. There is no rebound and no guarding.  Abdominal obesity without masses tenderness or organ enlargement  Musculoskeletal: Normal range of motion. He exhibits no edema.  Good mobility with both knees and no joint line tenderness or patellar tenderness. Patient was reminded if he continues to have problems to make appointment with orthopedic doctor that comes to this office for further evaluation.  Lymphadenopathy:    He has no cervical adenopathy.  Neurological: He is alert and oriented to person, place, and time. He has normal reflexes. No cranial nerve deficit.  Skin: Skin is warm and dry. No rash noted.  Psychiatric: He has a normal mood and affect. His behavior is normal. Judgment and thought content normal.  Nursing note and vitals reviewed.   BP 133/76 (BP Location: Right Arm)   Pulse (!) 49   Temp (!) 96.8 F (36 C) (Oral)   Ht 5' 6" (1.676 m)   Wt 260 lb (117.9 kg)   BMI 41.97 kg/m   The patient has seen the cardiologist in the past for general checkup and all of this was good. Chest x-ray with results pending====    Assessment & Plan:  1. Essential hypertension -Blood pressure is good today and he will continue with current treatment - BMP8+EGFR - CBC with Differential/Platelet - Hepatic function panel - DG Chest 2 View; Future  2. Pure hypercholesterolemia -Tinny with aggressive  therapeutic lifestyle changes and pravastatin pending results of lab work - CBC with Differential/Platelet - Lipid panel - DG Chest 2 View; Future  3. Vitamin D deficiency -Continue with vitamin D replacement pending results of lab work - CBC with Differential/Platelet - VITAMIN D 25 Hydroxy (Vit-D Deficiency, Fractures) - Lipid panel  4. Benign prostatic hyperplasia, unspecified whether lower urinary tract symptoms present -No complaints today with voiding symptoms. - CBC with Differential/Platelet  5. Gastroesophageal reflux disease without esophagitis -No complaints today with reflux symptoms and patient will continue with his omeprazole as currently doing and diet of avoidance - CBC with Differential/Platelet  6. Bradycardia -No symptoms with syncope or lightheadedness and rate was running between 48 and 60.  7. Pain in both knees, unspecified chronicity -This is most likely some generalized arthritis in both knees from the years working on concrete floors. We will continue to monitor this.  8. Inactivity -Since the  patient has retired he has not been quite as active. He is encouraged to get up and increase his activity level is so being a couch potato. This was a friendly reminder to not let the counts stay with him as often as he apparently has been doing. He has lost weight which is good.  9. Morbid obesity ([place]) -Continue with aggressive weight loss regimen by watching diet closely and increasing exercise and activity and drinking more water.  Meds ordered this encounter  Medications  . amLODipine (NORVASC) 5 MG tablet    Sig: Take 1 tablet (5 mg total) by mouth daily.    Dispense:  90 tablet    Refill:  3  . benazepril (LOTENSIN) 10 MG tablet    Sig: Take 1 tablet (10 mg total) by mouth daily.    Dispense:  90 tablet    Refill:  3  . lactulose (CHRONULAC) 10 GM/15ML solution    Sig: Take 15 mLs (10 g total) by mouth daily as needed for mild constipation.     Dispense:  360 mL    Refill:  1  . omeprazole (PRILOSEC) 20 MG capsule    Sig: Take 1 capsule (20 mg total) by mouth daily.    Dispense:  90 capsule    Refill:  3  . pravastatin (PRAVACHOL) 40 MG tablet    Sig: Take 1 tablet (40 mg total) by mouth daily.    Dispense:  90 tablet    Refill:  3   Patient Instructions                       Medicare Annual Wellness Visit  [facility] and the medical providers at [facility] strive to bring you the best medical care.  In doing so we not only want to address your current medical conditions and concerns but also to detect new conditions early and prevent illness, disease and health-related problems.    Medicare offers a yearly Wellness Visit which allows our clinical staff to assess your need for preventative services including immunizations, lifestyle education, counseling to decrease risk of preventable diseases and screening for fall risk and other medical concerns.    This visit is provided free of charge (no copay) for all Medicare recipients. The clinical pharmacists at [facility] have begun to conduct these Wellness Visits which will also include a thorough review of all your medications.    As you primary medical provider recommend that you make an appointment for your Annual Wellness Visit if you have not done so already this year.  You may set up this appointment before you leave today or you may call back ([phone]) and schedule an appointment.  Please make sure when you call that you mention that you are scheduling your Annual Wellness Visit with the clinical pharmacist so that the appointment may be made for the proper length of time.     Continue current medications. Continue good therapeutic lifestyle changes which include good diet and exercise. Fall precautions discussed with patient. If an FOBT was given today- please return it to our front desk. If you are over 14 years old - you  may need Prevnar 74 or the adult Pneumonia vaccine.  **Flu shots are available--- please call and schedule a FLU-CLINIC appointment**  After your visit with [place] today you will receive a survey in the mail or online from [employer] regarding your care with [place]. Please take a moment  to fill this out. Your feedback is very important to [place] as you can help [place] better understand your patient needs as well as improve your experience and satisfaction. WE CARE ABOUT YOU!!!   Get more active physically! Drink more water Reduce calorie intake by decreasing breads potatoes and sweets We will call with lab work results as soon as these results become available in addition we will call the chest x-ray results and let you know about the fecal occult blood test card result.   Arrie Senate MD

## 2017-03-08 LAB — CBC WITH DIFFERENTIAL/PLATELET
Basophils Absolute: 0 10*3/uL (ref 0.0–0.2)
Basos: 0 %
EOS (ABSOLUTE): 0.4 10*3/uL (ref 0.0–0.4)
EOS: 5 %
HEMOGLOBIN: 14.1 g/dL (ref 13.0–17.7)
Hematocrit: 42.4 % (ref 37.5–51.0)
IMMATURE GRANS (ABS): 0 10*3/uL (ref 0.0–0.1)
Immature Granulocytes: 0 %
LYMPHS: 35 %
Lymphocytes Absolute: 2.4 10*3/uL (ref 0.7–3.1)
MCH: 32.6 pg (ref 26.6–33.0)
MCHC: 33.3 g/dL (ref 31.5–35.7)
MCV: 98 fL — ABNORMAL HIGH (ref 79–97)
MONOCYTES: 8 %
Monocytes Absolute: 0.5 10*3/uL (ref 0.1–0.9)
NEUTROS ABS: 3.6 10*3/uL (ref 1.4–7.0)
Neutrophils: 52 %
Platelets: 188 10*3/uL (ref 150–379)
RBC: 4.33 x10E6/uL (ref 4.14–5.80)
RDW: 13.6 % (ref 12.3–15.4)
WBC: 6.9 10*3/uL (ref 3.4–10.8)

## 2017-03-08 LAB — BMP8+EGFR
BUN/Creatinine Ratio: 15 (ref 10–24)
BUN: 14 mg/dL (ref 8–27)
CALCIUM: 9.4 mg/dL (ref 8.6–10.2)
CO2: 28 mmol/L (ref 20–29)
CREATININE: 0.96 mg/dL (ref 0.76–1.27)
Chloride: 102 mmol/L (ref 96–106)
GFR, EST AFRICAN AMERICAN: 91 mL/min/{1.73_m2} (ref >59)
GFR, EST NON AFRICAN AMERICAN: 79 mL/min/{1.73_m2} (ref >59)
Glucose: 113 mg/dL — ABNORMAL HIGH (ref 65–99)
Potassium: 4.7 mmol/L (ref 3.5–5.2)
Sodium: 143 mmol/L (ref 134–144)

## 2017-03-08 LAB — HEPATIC FUNCTION PANEL
ALK PHOS: 44 IU/L (ref 39–117)
ALT: 17 IU/L (ref 0–44)
AST: 21 IU/L (ref 0–40)
Albumin: 4.2 g/dL (ref 3.5–4.8)
BILIRUBIN TOTAL: 0.6 mg/dL (ref 0.0–1.2)
BILIRUBIN, DIRECT: 0.15 mg/dL (ref 0.00–0.40)
Total Protein: 6.5 g/dL (ref 6.0–8.5)

## 2017-03-08 LAB — LIPID PANEL
CHOL/HDL RATIO: 3.6 ratio (ref 0.0–5.0)
Cholesterol, Total: 141 mg/dL (ref 100–199)
HDL: 39 mg/dL — ABNORMAL LOW (ref >39)
LDL Calculated: 68 mg/dL (ref 0–99)
Triglycerides: 168 mg/dL — ABNORMAL HIGH (ref 0–149)
VLDL CHOLESTEROL CAL: 34 mg/dL (ref 5–40)

## 2017-03-08 LAB — VITAMIN D 25 HYDROXY (VIT D DEFICIENCY, FRACTURES): VIT D 25 HYDROXY: 79 ng/mL (ref 30.0–100.0)

## 2017-03-24 ENCOUNTER — Encounter: Payer: Self-pay | Admitting: *Deleted

## 2017-07-16 ENCOUNTER — Ambulatory Visit: Payer: Medicare HMO | Admitting: Family Medicine

## 2017-07-16 ENCOUNTER — Encounter: Payer: Self-pay | Admitting: Family Medicine

## 2017-07-16 VITALS — BP 131/80 | HR 57 | Temp 97.0°F | Ht 66.0 in | Wt 267.0 lb

## 2017-07-16 DIAGNOSIS — E7800 Pure hypercholesterolemia, unspecified: Secondary | ICD-10-CM

## 2017-07-16 DIAGNOSIS — K219 Gastro-esophageal reflux disease without esophagitis: Secondary | ICD-10-CM

## 2017-07-16 DIAGNOSIS — M150 Primary generalized (osteo)arthritis: Secondary | ICD-10-CM

## 2017-07-16 DIAGNOSIS — N400 Enlarged prostate without lower urinary tract symptoms: Secondary | ICD-10-CM

## 2017-07-16 DIAGNOSIS — E559 Vitamin D deficiency, unspecified: Secondary | ICD-10-CM

## 2017-07-16 DIAGNOSIS — I10 Essential (primary) hypertension: Secondary | ICD-10-CM

## 2017-07-16 DIAGNOSIS — I700 Atherosclerosis of aorta: Secondary | ICD-10-CM

## 2017-07-16 DIAGNOSIS — M159 Polyosteoarthritis, unspecified: Secondary | ICD-10-CM

## 2017-07-16 DIAGNOSIS — R8290 Unspecified abnormal findings in urine: Secondary | ICD-10-CM

## 2017-07-16 LAB — URINALYSIS, COMPLETE
Bilirubin, UA: NEGATIVE
Glucose, UA: NEGATIVE
Nitrite, UA: NEGATIVE
PH UA: 5 (ref 5.0–7.5)
PROTEIN UA: NEGATIVE
Specific Gravity, UA: >1.03 — ABNORMAL HIGH (ref 1.005–1.030)
Urobilinogen, Ur: 1 mg/dL (ref 0.2–1.0)

## 2017-07-16 LAB — MICROSCOPIC EXAMINATION
Bacteria, UA: NONE SEEN
Epithelial Cells (non renal): NONE SEEN /hpf (ref 0–10)
Renal Epithel, UA: NONE SEEN /hpf

## 2017-07-16 MED ORDER — AMLODIPINE BESYLATE 5 MG PO TABS: 5.0000 mg | ORAL_TABLET | Freq: Every day | ORAL | 3 refills | Status: DC

## 2017-07-16 MED ORDER — BENAZEPRIL HCL 10 MG PO TABS: 10.0000 mg | ORAL_TABLET | Freq: Every day | ORAL | 3 refills | Status: DC

## 2017-07-16 MED ORDER — OMEPRAZOLE 20 MG PO CPDR: 20.0000 mg | DELAYED_RELEASE_CAPSULE | Freq: Every day | ORAL | 3 refills | Status: DC

## 2017-07-16 MED ORDER — PRAVASTATIN SODIUM 40 MG PO TABS: 40.0000 mg | ORAL_TABLET | Freq: Every day | ORAL | 3 refills | Status: DC

## 2017-07-16 NOTE — Addendum Note (Signed)
Addended by: Zannie Cove on: 07/16/2017 11:19 AM   Modules accepted: Orders

## 2017-07-16 NOTE — Patient Instructions (Addendum)
Medicare Annual Wellness Visit  [facility] and the medical providers at [facility] strive to bring you the best medical care.  In doing so we not only want to address your current medical conditions and concerns but also to detect new conditions early and prevent illness, disease and health-related problems.    Medicare offers a yearly Wellness Visit which allows our clinical staff to assess your need for preventative services including immunizations, lifestyle education, counseling to decrease risk of preventable diseases and screening for fall risk and other medical concerns.    This visit is provided free of charge (no copay) for all Medicare recipients. The clinical pharmacists at [facility] have begun to conduct these Wellness Visits which will also include a thorough review of all your medications.    As you primary medical provider recommend that you make an appointment for your Annual Wellness Visit if you have not done so already this year.  You may set up this appointment before you leave today or you may call back ([phone]) and schedule an appointment.  Please make sure when you call that you mention that you are scheduling your Annual Wellness Visit with the clinical pharmacist so that the appointment may be made for the proper length of time.     Continue current medications. Continue good therapeutic lifestyle changes which include good diet and exercise. Fall precautions discussed with patient. If an FOBT was given today- please return it to our front desk. If you are over 67 years old - you may need Prevnar 23 or the adult Pneumonia vaccine.  **Flu shots are available--- please call and schedule a FLU-CLINIC appointment**  After your visit with [place] today you will receive a survey in the mail or online from [employer] regarding your care with [place]. Please take a moment to fill this out. Your feedback is very  important to [place] as you can help [place] better understand your patient needs as well as improve your experience and satisfaction. WE CARE ABOUT YOU!!!  Try to lose weight by getting more exercise and drinking more water This winter, be careful do not put yourself at risk for falling.  Avoid climbing as much as possible.

## 2017-07-16 NOTE — Progress Notes (Signed)
Subjective:    Patient ID: [name] [name], male    DOB: 12/19/1945, 71 y.o.   MRN: [id number]  HPI  Pt here for follow up and management of chronic medical problems which includes hyperlipidemia and hypertension. He is taking medication regularly.  Patient is doing well overall.  He does complain of bilateral leg pain.  He is requesting refills on all of his medicines.  He is due to get a urinalysis today lab work today including a PSA and prostate exam and also due to return in FOBT.  He refuses the flu shot.  He is retired and at home more.  His vital signs are stable but unfortunately his weight is up 7 pounds.  The patient has a history of aortic atherosclerosis B12 deficiency hyperlipidemia erectile dysfunction and vitamin D deficiency.  He is also had an's 2017 a myocardial perfusion scan that was good.  The patient has worked on concrete floors in the past.  He has had x-rays of his hips which show degenerative changes as well as with both knees.  The knee changes were most significant medially on both sides.  With changes being worse on the right.  Patient is wife comes with him to the visit today.  He denies any chest pain shortness of breath trouble swallowing heartburn indigestion nausea vomiting diarrhea blood in the stool or change in bowel habits.  He is passing his water without problems.  He is not been very active due to the weather for several months and this he accounts for the reason as to why he is gaining weight.    Patient Active Problem List   Diagnosis Date Noted  . Aortic atherosclerosis ([place]) 03/07/2017  . B12 deficiency 12/31/2015  . Hyperlipidemia 07/01/2014  . Erectile dysfunction 02/28/2014  . Vitamin D deficiency 12/14/2012  . Testosterone deficiency   . Benign essential hypertension antepartum   . Sinus bradycardia   . GERD (gastroesophageal reflux disease)   . Colon polyp history   . BPH (benign prostatic hyperplasia)    Outpatient Encounter Medications  as of 07/16/2017  Medication Sig  . amLODipine (NORVASC) 5 MG tablet Take 1 tablet (5 mg total) by mouth daily.  Marland Kitchen aspirin 81 MG tablet Take 81 mg by mouth daily.    . benazepril (LOTENSIN) 10 MG tablet Take 1 tablet (10 mg total) by mouth daily.  . Cholecalciferol (VITAMIN D-3) 5000 UNITS TABS Take 1 tablet by mouth daily.    . Garlic 8483 MG CAPS Take 2 capsules by mouth daily.  Marland Kitchen lactulose (CHRONULAC) 10 GM/15ML solution Take 15 mLs (10 g total) by mouth daily as needed for mild constipation.  . NON FORMULARY Take 1 tablet by mouth daily. Equate allergy tablet  . Omega-3 Fatty Acids (FISH OIL) 1000 MG CAPS Take 2 capsules by mouth daily.  Marland Kitchen omeprazole (PRILOSEC) 20 MG capsule Take 1 capsule (20 mg total) by mouth daily.  . pravastatin (PRAVACHOL) 40 MG tablet Take 1 tablet (40 mg total) by mouth daily.  . vardenafil (LEVITRA) 20 MG tablet Take 20 mg by mouth daily as needed.    . vitamin C (ASCORBIC ACID) 500 MG tablet Take 500 mg by mouth daily.   No facility-administered encounter medications on file as of 07/16/2017.      Review of Systems  Constitutional: Negative.   HENT: Negative.   Eyes: Negative.   Respiratory: Negative.   Cardiovascular: Negative.   Gastrointestinal: Negative.   Endocrine: Negative.   Genitourinary: Negative.  Musculoskeletal: Positive for arthralgias (bilateral leg pain - got injections in the past - may need another).  Skin: Negative.   Allergic/Immunologic: Negative.   Neurological: Negative.   Hematological: Negative.   Psychiatric/Behavioral: Negative.        Objective:   Physical Exam  Constitutional: He is oriented to person, place, and time. He appears well-developed and well-nourished. No distress.  The patient is pleasant and relaxed and enjoying his life  HENT:  Head: Normocephalic and atraumatic.  Right Ear: External ear normal.  Left Ear: External ear normal.  Nose: Nose normal.  Mouth/Throat: Oropharynx is clear and moist. No  oropharyngeal exudate.  Edentulous  Eyes: Conjunctivae and EOM are normal. Pupils are equal, round, and reactive to light. Right eye exhibits no discharge. Left eye exhibits no discharge. No scleral icterus.  Needs eye exam  Neck: Normal range of motion. Neck supple. No thyromegaly present.  No bruits thyromegaly or anterior cervical adenopathy  Cardiovascular: Normal rate, regular rhythm, normal heart sounds and intact distal pulses.  No murmur heard. Heart has a regular rate and rhythm at 60/min  Pulmonary/Chest: Effort normal and breath sounds normal. No respiratory distress. He has no wheezes. He has no rales. He exhibits no tenderness.  Clear anteriorly and posteriorly with no axillary adenopathy or chest wall masses  Abdominal: Soft. Bowel sounds are normal. He exhibits no mass. There is no tenderness. There is no rebound and no guarding.  Abdominal obesity without masses tenderness or organ enlargement or bruits right lower quadrant abdominal incision scar  Genitourinary: Rectum normal and penis normal.  Genitourinary Comments: The prostate is slightly enlarged but smooth.  There were no rectal masses.  The external genitalia were within normal limits.  There were no inguinal hernias palpable.  Musculoskeletal: Normal range of motion. He exhibits no edema.  Tender at joint lines of both knees.  Lymphadenopathy:    He has no cervical adenopathy.  Neurological: He is alert and oriented to person, place, and time. He has normal reflexes. No cranial nerve deficit.  Skin: Skin is warm and dry. No rash noted.  Psychiatric: He has a normal mood and affect. His behavior is normal. Judgment and thought content normal.  Nursing note and vitals reviewed.   BP 131/80 (BP Location: Left Arm)   Pulse (!) 57   Temp (!) 97 F (36.1 C) (Oral)   Ht '5\' 6"'  (1.676 m)   Wt 267 lb (121.1 kg)   BMI 43.09 kg/m        Assessment & Plan:  1. Pure hypercholesterolemia -Continue with current  treatment pending results of lab work - CBC with Differential/Platelet - Lipid panel  2. Essential hypertension -The blood pressure is good today and he will continue with current treatment - CBC with Differential/Platelet - BMP8+EGFR - Hepatic function panel  3. Vitamin D deficiency -Continue with current treatment pending results of lab work - CBC with Differential/Platelet - VITAMIN D 25 Hydroxy (Vit-D Deficiency, Fractures)  4. Benign prostatic hyperplasia, unspecified whether lower urinary tract symptoms present -The prostate remains slightly enlarged without lumps or masses and the patient is having no symptoms with this currently. - CBC with Differential/Platelet - PSA, total and free - Urinalysis, Complete  5. Gastroesophageal reflux disease without esophagitis -He had no complaints today with reflux.  He will continue with his Prilosec. - CBC with Differential/Platelet - Hepatic function panel  6. Morbid obesity ([place]) -He understands the importance of losing weight because of its effect on his joints  and back and knees.  It also has increased impacts for developing diabetes.  He is encouraged to lose weight through diet and exercise.  7. Aortic atherosclerosis ([place]) -Continue with current treatment for hyperlipidemia pending results of lab work  8. Primary osteoarthritis involving multiple joints -Take Tylenol for aches pains and fever. Meds ordered this encounter  Medications  . pravastatin (PRAVACHOL) 40 MG tablet    Sig: Take 1 tablet (40 mg total) by mouth daily.    Dispense:  90 tablet    Refill:  3  . omeprazole (PRILOSEC) 20 MG capsule    Sig: Take 1 capsule (20 mg total) by mouth daily.    Dispense:  90 capsule    Refill:  3  . benazepril (LOTENSIN) 10 MG tablet    Sig: Take 1 tablet (10 mg total) by mouth daily.    Dispense:  90 tablet    Refill:  3  . amLODipine (NORVASC) 5 MG tablet    Sig: Take 1 tablet (5 mg total) by mouth daily.    Dispense:   90 tablet    Refill:  3   Patient Instructions                       Medicare Annual Wellness Visit  [facility] and the medical providers at [facility] strive to bring you the best medical care.  In doing so we not only want to address your current medical conditions and concerns but also to detect new conditions early and prevent illness, disease and health-related problems.    Medicare offers a yearly Wellness Visit which allows our clinical staff to assess your need for preventative services including immunizations, lifestyle education, counseling to decrease risk of preventable diseases and screening for fall risk and other medical concerns.    This visit is provided free of charge (no copay) for all Medicare recipients. The clinical pharmacists at [facility] have begun to conduct these Wellness Visits which will also include a thorough review of all your medications.    As you primary medical provider recommend that you make an appointment for your Annual Wellness Visit if you have not done so already this year.  You may set up this appointment before you leave today or you may call back ([phone]) and schedule an appointment.  Please make sure when you call that you mention that you are scheduling your Annual Wellness Visit with the clinical pharmacist so that the appointment may be made for the proper length of time.     Continue current medications. Continue good therapeutic lifestyle changes which include good diet and exercise. Fall precautions discussed with patient. If an FOBT was given today- please return it to our front desk. If you are over 50 years old - you may need Prevnar 74 or the adult Pneumonia vaccine.  **Flu shots are available--- please call and schedule a FLU-CLINIC appointment**  After your visit with [place] today you will receive a survey in the mail or online from [employer] regarding your care with [place]. Please take a  moment to fill this out. Your feedback is very important to [place] as you can help [place] better understand your patient needs as well as improve your experience and satisfaction. WE CARE ABOUT YOU!!!  Try to lose weight by getting more exercise and drinking more water This winter, be careful do not put yourself at risk for falling.  Avoid climbing as much as  possible.   Arrie Senate MD

## 2017-07-17 LAB — CBC WITH DIFFERENTIAL/PLATELET
BASOS ABS: 0 10*3/uL (ref 0.0–0.2)
BASOS: 0 %
EOS (ABSOLUTE): 0.3 10*3/uL (ref 0.0–0.4)
Eos: 4 %
Hematocrit: 42 % (ref 37.5–51.0)
Hemoglobin: 14.6 g/dL (ref 13.0–17.7)
IMMATURE GRANULOCYTES: 0 %
Immature Grans (Abs): 0 10*3/uL (ref 0.0–0.1)
LYMPHS: 32 %
Lymphocytes Absolute: 2.2 10*3/uL (ref 0.7–3.1)
MCH: 33.8 pg — ABNORMAL HIGH (ref 26.6–33.0)
MCHC: 34.8 g/dL (ref 31.5–35.7)
MCV: 97 fL (ref 79–97)
MONOS ABS: 0.5 10*3/uL (ref 0.1–0.9)
Monocytes: 7 %
NEUTROS PCT: 57 %
Neutrophils Absolute: 3.8 10*3/uL (ref 1.4–7.0)
PLATELETS: 188 10*3/uL (ref 150–379)
RBC: 4.32 x10E6/uL (ref 4.14–5.80)
RDW: 13.2 % (ref 12.3–15.4)
WBC: 6.8 10*3/uL (ref 3.4–10.8)

## 2017-07-17 LAB — HEPATIC FUNCTION PANEL
ALT: 17 IU/L (ref 0–44)
AST: 21 IU/L (ref 0–40)
Albumin: 4.2 g/dL (ref 3.5–4.8)
Alkaline Phosphatase: 52 IU/L (ref 39–117)
BILIRUBIN TOTAL: 0.6 mg/dL (ref 0.0–1.2)
BILIRUBIN, DIRECT: 0.18 mg/dL (ref 0.00–0.40)
TOTAL PROTEIN: 6.4 g/dL (ref 6.0–8.5)

## 2017-07-17 LAB — PSA, TOTAL AND FREE
PSA, Free Pct: 21.4 %
PSA, Free: 0.6 ng/mL
Prostate Specific Ag, Serum: 2.8 ng/mL (ref 0.0–4.0)

## 2017-07-17 LAB — BMP8+EGFR
BUN / CREAT RATIO: 17 (ref 10–24)
BUN: 16 mg/dL (ref 8–27)
CALCIUM: 9.6 mg/dL (ref 8.6–10.2)
CHLORIDE: 101 mmol/L (ref 96–106)
CO2: 26 mmol/L (ref 20–29)
Creatinine, Ser: 0.94 mg/dL (ref 0.76–1.27)
GFR calc non Af Amer: 81 mL/min/{1.73_m2} (ref >59)
GFR, EST AFRICAN AMERICAN: 93 mL/min/{1.73_m2} (ref >59)
GLUCOSE: 116 mg/dL — AB (ref 65–99)
POTASSIUM: 5 mmol/L (ref 3.5–5.2)
Sodium: 143 mmol/L (ref 134–144)

## 2017-07-17 LAB — LIPID PANEL
Chol/HDL Ratio: 3.3 ratio (ref 0.0–5.0)
Cholesterol, Total: 136 mg/dL (ref 100–199)
HDL: 41 mg/dL (ref >39)
LDL Calculated: 58 mg/dL (ref 0–99)
Triglycerides: 186 mg/dL — ABNORMAL HIGH (ref 0–149)
VLDL Cholesterol Cal: 37 mg/dL (ref 5–40)

## 2017-07-17 LAB — VITAMIN D 25 HYDROXY (VIT D DEFICIENCY, FRACTURES): VIT D 25 HYDROXY: 57.4 ng/mL (ref 30.0–100.0)

## 2017-07-17 NOTE — Addendum Note (Signed)
Addended byCarrolyn Leigh on: 07/17/2017 11:58 AM   Modules accepted: Orders

## 2017-08-12 ENCOUNTER — Other Ambulatory Visit: Payer: Medicare HMO

## 2017-08-12 DIAGNOSIS — R8290 Unspecified abnormal findings in urine: Secondary | ICD-10-CM | POA: Diagnosis not present

## 2017-08-12 DIAGNOSIS — Z1211 Encounter for screening for malignant neoplasm of colon: Secondary | ICD-10-CM | POA: Diagnosis not present

## 2017-08-12 LAB — URINALYSIS, COMPLETE
Bilirubin, UA: NEGATIVE
GLUCOSE, UA: NEGATIVE
LEUKOCYTES UA: NEGATIVE
Nitrite, UA: NEGATIVE
Protein, UA: NEGATIVE
Specific Gravity, UA: 1.025 (ref 1.005–1.030)
Urobilinogen, Ur: 2 mg/dL — ABNORMAL HIGH (ref 0.2–1.0)
pH, UA: 6 (ref 5.0–7.5)

## 2017-08-12 LAB — MICROSCOPIC EXAMINATION
BACTERIA UA: NONE SEEN
Renal Epithel, UA: NONE SEEN /hpf

## 2017-08-13 ENCOUNTER — Other Ambulatory Visit: Payer: Self-pay | Admitting: *Deleted

## 2017-08-13 DIAGNOSIS — R8290 Unspecified abnormal findings in urine: Secondary | ICD-10-CM

## 2017-08-13 LAB — FECAL OCCULT BLOOD, IMMUNOCHEMICAL: Fecal Occult Bld: NEGATIVE

## 2017-10-06 ENCOUNTER — Encounter: Payer: Self-pay | Admitting: Physician Assistant

## 2017-10-06 ENCOUNTER — Ambulatory Visit (INDEPENDENT_AMBULATORY_CARE_PROVIDER_SITE_OTHER): Payer: Medicare HMO | Admitting: Physician Assistant

## 2017-10-06 VITALS — BP 109/65 | HR 64 | Temp 99.2°F | Ht 66.0 in | Wt 272.0 lb

## 2017-10-06 DIAGNOSIS — J40 Bronchitis, not specified as acute or chronic: Secondary | ICD-10-CM | POA: Diagnosis not present

## 2017-10-06 DIAGNOSIS — J101 Influenza due to other identified influenza virus with other respiratory manifestations: Secondary | ICD-10-CM | POA: Diagnosis not present

## 2017-10-06 DIAGNOSIS — R509 Fever, unspecified: Secondary | ICD-10-CM | POA: Diagnosis not present

## 2017-10-06 LAB — VERITOR FLU A/B WAIVED
INFLUENZA B: NEGATIVE
Influenza A: POSITIVE — AB

## 2017-10-06 MED ORDER — METHYLPREDNISOLONE ACETATE 80 MG/ML IJ SUSP
80.0000 mg | Freq: Once | INTRAMUSCULAR | Status: AC
Start: 2017-10-06 — End: 2017-10-06
  Administered 2017-10-06: 80 mg via INTRAMUSCULAR

## 2017-10-06 MED ORDER — HYDROCODONE-HOMATROPINE 5-1.5 MG/5ML PO SYRP: 5.0000 mL | ORAL_SOLUTION | Freq: Three times a day (TID) | ORAL | 0 refills | Status: DC | PRN

## 2017-10-06 MED ORDER — OSELTAMIVIR PHOSPHATE 75 MG PO CAPS: 75.0000 mg | ORAL_CAPSULE | Freq: Two times a day (BID) | ORAL | 0 refills | Status: DC

## 2017-10-06 MED ORDER — DOXYCYCLINE HYCLATE 100 MG PO TABS: 100.0000 mg | ORAL_TABLET | Freq: Two times a day (BID) | ORAL | 0 refills | Status: DC

## 2017-10-06 NOTE — Patient Instructions (Signed)

## 2017-10-07 NOTE — Progress Notes (Signed)
BP 109/65   Pulse 64   Temp 99.2 F (37.3 C) (Oral)   Ht 5\' 6"  (1.676 m)   Wt 272 lb (123.4 kg)   BMI 43.90 kg/m    Subjective:    Patient ID: [name] [name], male    DOB: 09-Sep-1945, 72 y.o.   MRN: [id number]  HPI: [name] [name] is a 72 y.o. male presenting on 10/06/2017 for Nasal Congestion; Fever; and Cough  This patient has had many days of sinus headache and postnasal drainage. There is copious drainage at times. Denies any fever at this time. There has been a history of sinus infections in the past.  No history of sinus surgery. There is cough at night. It has become more prevalent in recent days. This patient has had less than 2 days severe fever, chills, myalgias.  Complains of sinus headache and postnasal drainage. There is copious drainage at times. Associated sore throat, decreased appetite and headache.  Has been exposed to influenza.    Past Medical History:  Diagnosis Date  . Colon polyp   . GERD (gastroesophageal reflux disease)   . Hemorrhoid   . Hyperplasia of prostate   . Hypertension   . Other and unspecified hyperlipidemia   . Other testicular hypofunction   . Sinus bradycardia    Relevant past medical, surgical, family and social history reviewed and updated as indicated. Interim medical history since our last visit reviewed. Allergies and medications reviewed and updated. DATA REVIEWED: CHART IN EPIC  Family History reviewed for pertinent findings.  Review of Systems  Constitutional: Positive for activity change, fatigue and fever. Negative for appetite change.  HENT: Positive for congestion and sore throat. Negative for sinus pressure.   Eyes: Negative.  Negative for pain and visual disturbance.  Respiratory: Negative for cough, chest tightness, shortness of breath and wheezing.   Cardiovascular: Negative.  Negative for chest pain, palpitations and leg swelling.  Gastrointestinal: Positive for nausea. Negative for abdominal pain, diarrhea and  vomiting.  Endocrine: Negative.   Genitourinary: Negative.   Musculoskeletal: Positive for back pain and myalgias. Negative for arthralgias.  Skin: Negative.  Negative for color change and rash.  Neurological: Positive for headaches. Negative for weakness and numbness.  Psychiatric/Behavioral: Negative.     Allergies as of 10/06/2017      Reactions   Cialis [tadalafil]    Viagra [sildenafil Citrate] Other (See Comments)   heartburn      Medication List        Accurate as of 10/06/17 11:59 PM. Always use your most recent med list.          amLODipine 5 MG tablet Commonly known as:  NORVASC Take 1 tablet (5 mg total) by mouth daily.   aspirin 81 MG tablet Take 81 mg by mouth daily.   benazepril 10 MG tablet Commonly known as:  LOTENSIN Take 1 tablet (10 mg total) by mouth daily.   doxycycline 100 MG tablet Commonly known as:  VIBRA-TABS Take 1 tablet (100 mg total) by mouth 2 (two) times daily.   Fish Oil 1000 MG Caps Take 2 capsules by mouth daily.   Garlic 7829 MG Caps Take 2 capsules by mouth daily.   HYDROcodone-homatropine 5-1.5 MG/5ML syrup Commonly known as:  HYCODAN Take 5 mLs by mouth every 8 (eight) hours as needed for cough.   lactulose 10 GM/15ML solution Commonly known as:  CHRONULAC Take 15 mLs (10 g total) by mouth daily as needed for mild constipation.  NON FORMULARY Take 1 tablet by mouth daily. Equate allergy tablet   omeprazole 20 MG capsule Commonly known as:  PRILOSEC Take 1 capsule (20 mg total) by mouth daily.   oseltamivir 75 MG capsule Commonly known as:  TAMIFLU Take 1 capsule (75 mg total) by mouth 2 (two) times daily.   pravastatin 40 MG tablet Commonly known as:  PRAVACHOL Take 1 tablet (40 mg total) by mouth daily.   vardenafil 20 MG tablet Commonly known as:  LEVITRA Take 20 mg by mouth daily as needed.   vitamin C 500 MG tablet Commonly known as:  ASCORBIC ACID Take 500 mg by mouth daily.   Vitamin D-3 5000 units  Tabs Take 1 tablet by mouth daily.          Objective:    BP 109/65   Pulse 64   Temp 99.2 F (37.3 C) (Oral)   Ht 5\' 6"  (1.676 m)   Wt 272 lb (123.4 kg)   BMI 43.90 kg/m   Allergies  Allergen Reactions  . Cialis [Tadalafil]   . Viagra [Sildenafil Citrate] Other (See Comments)    heartburn    Wt Readings from Last 3 Encounters:  10/06/17 272 lb (123.4 kg)  07/16/17 267 lb (121.1 kg)  03/07/17 260 lb (117.9 kg)    Physical Exam  Constitutional: He is oriented to person, place, and time. He appears well-developed and well-nourished. He appears distressed.  HENT:  Head: Normocephalic and atraumatic.  Right Ear: Tympanic membrane normal. No drainage. No middle ear effusion.  Left Ear: Tympanic membrane normal. No drainage.  No middle ear effusion.  Nose: Mucosal edema and rhinorrhea present. Right sinus exhibits no maxillary sinus tenderness. Left sinus exhibits no maxillary sinus tenderness.  Mouth/Throat: Uvula is midline. Posterior oropharyngeal erythema present. No oropharyngeal exudate.  Eyes: Conjunctivae and EOM are normal. Pupils are equal, round, and reactive to light. Right eye exhibits no discharge. Left eye exhibits no discharge.  Neck: Normal range of motion.  Cardiovascular: Normal rate, regular rhythm and normal heart sounds.  Pulmonary/Chest: Effort normal and breath sounds normal. No respiratory distress. He has no wheezes.  Abdominal: Soft.  Lymphadenopathy:    He has no cervical adenopathy.  Neurological: He is alert and oriented to person, place, and time.  Skin: Skin is warm and dry.  Psychiatric: He has a normal mood and affect. His behavior is normal.  Nursing note and vitals reviewed.   Results for orders placed or performed in visit on 10/06/17  Veritor Flu A/B Waived  Result Value Ref Range   Influenza A Positive (A) Negative   Influenza B Negative Negative      Assessment & Plan:   1. Fever, unspecified fever cause - Veritor Flu A/B  Waived  2. Bronchitis - doxycycline (VIBRA-TABS) 100 MG tablet; Take 1 tablet (100 mg total) by mouth 2 (two) times daily.  Dispense: 20 tablet; Refill: 0 - methylPREDNISolone acetate (DEPO-MEDROL) injection 80 mg - HYDROcodone-homatropine (HYCODAN) 5-1.5 MG/5ML syrup; Take 5 mLs by mouth every 8 (eight) hours as needed for cough.  Dispense: 120 mL; Refill: 0  3. Influenza A - oseltamivir (TAMIFLU) 75 MG capsule; Take 1 capsule (75 mg total) by mouth 2 (two) times daily.  Dispense: 10 capsule; Refill: 0   Continue all other maintenance medications as listed above.  Follow up plan: Return if symptoms worsen or fail to improve.  Educational handout given for [place] PA-C [facility] [address]  [place], [place] [address] [phone]   10/07/2017, 10:40 PM

## 2017-10-27 DEATH — deceased

## 2017-11-26 ENCOUNTER — Encounter: Payer: Self-pay | Admitting: Family Medicine

## 2017-11-26 ENCOUNTER — Ambulatory Visit (INDEPENDENT_AMBULATORY_CARE_PROVIDER_SITE_OTHER): Payer: Medicare HMO | Admitting: Family Medicine

## 2017-11-26 VITALS — BP 119/72 | HR 59 | Temp 97.4°F | Ht 66.0 in | Wt 264.0 lb

## 2017-11-26 DIAGNOSIS — E559 Vitamin D deficiency, unspecified: Secondary | ICD-10-CM | POA: Diagnosis not present

## 2017-11-26 DIAGNOSIS — I700 Atherosclerosis of aorta: Secondary | ICD-10-CM

## 2017-11-26 DIAGNOSIS — N400 Enlarged prostate without lower urinary tract symptoms: Secondary | ICD-10-CM

## 2017-11-26 DIAGNOSIS — W57XXXA Bitten or stung by nonvenomous insect and other nonvenomous arthropods, initial encounter: Secondary | ICD-10-CM | POA: Diagnosis not present

## 2017-11-26 DIAGNOSIS — I10 Essential (primary) hypertension: Secondary | ICD-10-CM | POA: Diagnosis not present

## 2017-11-26 DIAGNOSIS — K219 Gastro-esophageal reflux disease without esophagitis: Secondary | ICD-10-CM

## 2017-11-26 DIAGNOSIS — E7800 Pure hypercholesterolemia, unspecified: Secondary | ICD-10-CM

## 2017-11-26 MED ORDER — DOXYCYCLINE HYCLATE 100 MG PO TABS: 100.0000 mg | ORAL_TABLET | Freq: Two times a day (BID) | ORAL | 1 refills | Status: DC

## 2017-11-26 NOTE — Patient Instructions (Addendum)
Medicare Annual Wellness Visit  [facility] and the medical providers at [facility] strive to bring you the best medical care.  In doing so we not only want to address your current medical conditions and concerns but also to detect new conditions early and prevent illness, disease and health-related problems.    Medicare offers a yearly Wellness Visit which allows our clinical staff to assess your need for preventative services including immunizations, lifestyle education, counseling to decrease risk of preventable diseases and screening for fall risk and other medical concerns.    This visit is provided free of charge (no copay) for all Medicare recipients. The clinical pharmacists at [facility] have begun to conduct these Wellness Visits which will also include a thorough review of all your medications.    As you primary medical provider recommend that you make an appointment for your Annual Wellness Visit if you have not done so already this year.  You may set up this appointment before you leave today or you may call back ([phone]) and schedule an appointment.  Please make sure when you call that you mention that you are scheduling your Annual Wellness Visit with the clinical pharmacist so that the appointment may be made for the proper length of time.     Continue current medications. Continue good therapeutic lifestyle changes which include good diet and exercise. Fall precautions discussed with patient. If an FOBT was given today- please return it to our front desk. If you are over 59 years old - you may need Prevnar 76 or the adult Pneumonia vaccine.  **Flu shots are available--- please call and schedule a FLU-CLINIC appointment**  After your visit with [place] today you will receive a survey in the mail or online from [employer] regarding your care with [place]. Please take a moment to fill this out. Your feedback is very  important to [place] as you can help [place] better understand your patient needs as well as improve your experience and satisfaction. WE CARE ABOUT YOU!!!   Use deep [place] off routinely especially when working in the yard sprayed it on your lower extremities and on your clothing Continue to walk and exercise regularly and drink plenty of water and reduce the sugar intake in your diet

## 2017-11-26 NOTE — Progress Notes (Signed)
Subjective:    Patient ID: [name] [name], male    DOB: May 12, 1946, 72 y.o.   MRN: [id number]  HPI Pt here for follow up and management of chronic medical problems which includes hypertension and hyperlipidemia. He is taking medication regularly.  Patient is doing well today with no specific complaints other than a tick bite.  He comes to the visit today with his wife.  He is retired and fortunately his weight is down 8 pounds since the last visit.  He still has a BMI that is in the morbid obese category.  He will get lab work today.  The patient denies any chest pain pressure tightness or shortness of breath anymore than usual.  He is more active recently because he is doing yard work and Surveyor, minerals a garden.  He denies any trouble with swallowing heartburn indigestion nausea vomiting or diarrhea.  He does have some occasional bright red blood in the stool and says this is from a hemorrhoid problems.  This is been an ongoing issue.  His next colonoscopy is not due until September 2021 and he is aware that.  He is passing his water without problems.     Patient Active Problem List   Diagnosis Date Noted  . Aortic atherosclerosis ([place]) 03/07/2017  . B12 deficiency 12/31/2015  . Hyperlipidemia 07/01/2014  . Erectile dysfunction 02/28/2014  . Vitamin D deficiency 12/14/2012  . Testosterone deficiency   . Benign essential hypertension antepartum   . Sinus bradycardia   . GERD (gastroesophageal reflux disease)   . Colon polyp history   . BPH (benign prostatic hyperplasia)    Outpatient Encounter Medications as of 11/26/2017  Medication Sig  . amLODipine (NORVASC) 5 MG tablet Take 1 tablet (5 mg total) by mouth daily.  Marland Kitchen aspirin 81 MG tablet Take 81 mg by mouth daily.    . benazepril (LOTENSIN) 10 MG tablet Take 1 tablet (10 mg total) by mouth daily.  . Cholecalciferol (VITAMIN D-3) 5000 UNITS TABS Take 1 tablet by mouth daily.    Marland Kitchen doxycycline (VIBRA-TABS) 100 MG tablet Take 1 tablet (100 mg  total) by mouth 2 (two) times daily.  . Garlic 6010 MG CAPS Take 2 capsules by mouth daily.  Marland Kitchen HYDROcodone-homatropine (HYCODAN) 5-1.5 MG/5ML syrup Take 5 mLs by mouth every 8 (eight) hours as needed for cough.  . lactulose (CHRONULAC) 10 GM/15ML solution Take 15 mLs (10 g total) by mouth daily as needed for mild constipation.  . NON FORMULARY Take 1 tablet by mouth daily. Equate allergy tablet  . Omega-3 Fatty Acids (FISH OIL) 1000 MG CAPS Take 2 capsules by mouth daily.  Marland Kitchen omeprazole (PRILOSEC) 20 MG capsule Take 1 capsule (20 mg total) by mouth daily.  . pravastatin (PRAVACHOL) 40 MG tablet Take 1 tablet (40 mg total) by mouth daily.  . vitamin C (ASCORBIC ACID) 500 MG tablet Take 500 mg by mouth daily.  . [DISCONTINUED] oseltamivir (TAMIFLU) 75 MG capsule Take 1 capsule (75 mg total) by mouth 2 (two) times daily.  . [DISCONTINUED] vardenafil (LEVITRA) 20 MG tablet Take 20 mg by mouth daily as needed.     No facility-administered encounter medications on file as of 11/26/2017.      Review of Systems  Constitutional: Negative.   HENT: Negative.   Eyes: Negative.   Respiratory: Negative.   Cardiovascular: Negative.   Gastrointestinal: Negative.   Endocrine: Negative.   Genitourinary: Negative.   Musculoskeletal: Negative.   Skin: Negative.  Tick bite - back   Allergic/Immunologic: Negative.   Neurological: Negative.   Hematological: Negative.   Psychiatric/Behavioral: Negative.        Objective:   Physical Exam  Constitutional: He is oriented to person, place, and time. He appears well-developed and well-nourished. No distress.  The patient is calm pleasant and relaxed  HENT:  Head: Normocephalic and atraumatic.  Right Ear: External ear normal.  Left Ear: External ear normal.  Nose: Nose normal.  Mouth/Throat: Oropharynx is clear and moist. No oropharyngeal exudate.  Edentulous  Eyes: Pupils are equal, round, and reactive to light. Conjunctivae and EOM are normal.  Right eye exhibits no discharge. Left eye exhibits no discharge. No scleral icterus.  Neck: Normal range of motion. Neck supple. No thyromegaly present.  No bruits thyromegaly or anterior cervical adenopathy  Cardiovascular: Normal rate, regular rhythm, normal heart sounds and intact distal pulses. Exam reveals no gallop and no friction rub.  No murmur heard. Heart is regular at 60/min  Pulmonary/Chest: Effort normal and breath sounds normal. No respiratory distress. He has no wheezes. He has no rales. He exhibits no tenderness.  Clear anteriorly and posteriorly  Abdominal: Soft. Bowel sounds are normal. He exhibits no mass. There is no tenderness. There is no rebound and no guarding.  Abdominal obesity without masses tenderness or organ enlargement or bruits  Musculoskeletal: Normal range of motion. He exhibits no edema or tenderness.  Lymphadenopathy:    He has no cervical adenopathy.  Neurological: He is alert and oriented to person, place, and time. He has normal reflexes. No cranial nerve deficit.  Skin: Skin is warm and dry. No rash noted. There is erythema. No pallor.  An engorged deer tick was removed from his back with forceps.  He has had a previous tick on his abdomen.  We will do test for [facility] spotted fever and Lyme disease today and patient was encouraged to take the medicine regularly for 3 weeks with food.  He was also encouraged to use deep Sherral Hammers off on his clothing and lower extremities before going out into the yard.  Erythema at site of bite but no erythema migrans.  There is also a healing tick bite on the abdomen.  Psychiatric: He has a normal mood and affect. His behavior is normal. Judgment and thought content normal.  Nursing note and vitals reviewed.  BP 119/72 (BP Location: Left Arm)   Pulse (!) 59   Temp (!) 97.4 F (36.3 C) (Oral)   Ht '5\' 6"'  (1.676 m)   Wt 264 lb (119.7 kg)   BMI 42.61 kg/m         Assessment & Plan:  1. Pure  hypercholesterolemia -10 you current treatment and as aggressive therapeutic lifestyle changes as possible pending results of lab work - CBC with Differential/Platelet - Lipid panel  2. Vitamin D deficiency -Continue current treatment pending results of lab work - CBC with Differential/Platelet - VITAMIN D 25 Hydroxy (Vit-D Deficiency, Fractures)  3. Essential hypertension -Blood pressure is good today and he will continue with his current blood pressure treatment - BMP8+EGFR - CBC with Differential/Platelet - Hepatic function panel  4. Benign prostatic hyperplasia, unspecified whether lower urinary tract symptoms present -No complaints today with voiding. - CBC with Differential/Platelet  5. Gastroesophageal reflux disease without esophagitis -No complaints today with reflux and he will continue with his omeprazole. - CBC with Differential/Platelet  6. Aortic atherosclerosis ([facility]) -Continue with pravastatin weight loss and as aggressive therapeutic lifestyle changes as  possible - CBC with Differential/Platelet  7. Morbid obesity ([place]) -The patient was reminded of his BMI and even though his weight is improved he is still in the morbid obese category based on his weight - CBC with Differential/Platelet  8. Tick bite, initial encounter -Tick was removed from the back and cleansed with alcohol after removing. - [place] spotted fvr abs pnl(IgG+IgM) - Lyme Ab/Western Blot Reflex -He will be prescribed doxycycline 100 mg twice daily with food for 3 weeks. -He will use deep Sherral Hammers off in the future to avoid tick bites.  Meds ordered this encounter  Medications  . doxycycline (VIBRA-TABS) 100 MG tablet    Sig: Take 1 tablet (100 mg total) by mouth 2 (two) times daily. 1 po bid    Dispense:  42 tablet    Refill:  1   Patient Instructions                       Medicare Annual Wellness Visit  [facility] and the medical providers at [facility] strive to  bring you the best medical care.  In doing so we not only want to address your current medical conditions and concerns but also to detect new conditions early and prevent illness, disease and health-related problems.    Medicare offers a yearly Wellness Visit which allows our clinical staff to assess your need for preventative services including immunizations, lifestyle education, counseling to decrease risk of preventable diseases and screening for fall risk and other medical concerns.    This visit is provided free of charge (no copay) for all Medicare recipients. The clinical pharmacists at [facility] have begun to conduct these Wellness Visits which will also include a thorough review of all your medications.    As you primary medical provider recommend that you make an appointment for your Annual Wellness Visit if you have not done so already this year.  You may set up this appointment before you leave today or you may call back ([phone]) and schedule an appointment.  Please make sure when you call that you mention that you are scheduling your Annual Wellness Visit with the clinical pharmacist so that the appointment may be made for the proper length of time.     Continue current medications. Continue good therapeutic lifestyle changes which include good diet and exercise. Fall precautions discussed with patient. If an FOBT was given today- please return it to our front desk. If you are over 34 years old - you may need Prevnar 90 or the adult Pneumonia vaccine.  **Flu shots are available--- please call and schedule a FLU-CLINIC appointment**  After your visit with [place] today you will receive a survey in the mail or online from [employer] regarding your care with [place]. Please take a moment to fill this out. Your feedback is very important to [place] as you can help [place] better understand your patient needs as well as improve your experience and satisfaction. WE CARE ABOUT YOU!!!    Use deep Sherral Hammers off routinely especially when working in the yard sprayed it on your lower extremities and on your clothing Continue to walk and exercise regularly and drink plenty of water and reduce the sugar intake in your diet   Arrie Senate MD

## 2017-11-28 LAB — ROCKY MTN SPOTTED FVR ABS PNL(IGG+IGM)
RMSF IgG: NEGATIVE
RMSF IgM: 0.45 index (ref 0.00–0.89)

## 2017-11-28 LAB — HEPATIC FUNCTION PANEL
ALBUMIN: 4.1 g/dL (ref 3.5–4.8)
ALK PHOS: 53 IU/L (ref 39–117)
ALT: 18 IU/L (ref 0–44)
AST: 20 IU/L (ref 0–40)
BILIRUBIN TOTAL: 0.7 mg/dL (ref 0.0–1.2)
BILIRUBIN, DIRECT: 0.21 mg/dL (ref 0.00–0.40)
Total Protein: 6.6 g/dL (ref 6.0–8.5)

## 2017-11-28 LAB — CBC WITH DIFFERENTIAL/PLATELET
BASOS ABS: 0 10*3/uL (ref 0.0–0.2)
Basos: 0 %
EOS (ABSOLUTE): 0.3 10*3/uL (ref 0.0–0.4)
EOS: 4 %
Hematocrit: 40.4 % (ref 37.5–51.0)
Hemoglobin: 13.8 g/dL (ref 13.0–17.7)
IMMATURE GRANULOCYTES: 0 %
Immature Grans (Abs): 0 10*3/uL (ref 0.0–0.1)
LYMPHS ABS: 2.2 10*3/uL (ref 0.7–3.1)
Lymphs: 31 %
MCH: 32.2 pg (ref 26.6–33.0)
MCHC: 34.2 g/dL (ref 31.5–35.7)
MCV: 94 fL (ref 79–97)
MONOS ABS: 0.5 10*3/uL (ref 0.1–0.9)
Monocytes: 7 %
NEUTROS PCT: 58 %
Neutrophils Absolute: 4.2 10*3/uL (ref 1.4–7.0)
Platelets: 186 10*3/uL (ref 150–379)
RBC: 4.28 x10E6/uL (ref 4.14–5.80)
RDW: 13.9 % (ref 12.3–15.4)
WBC: 7.1 10*3/uL (ref 3.4–10.8)

## 2017-11-28 LAB — VITAMIN D 25 HYDROXY (VIT D DEFICIENCY, FRACTURES): Vit D, 25-Hydroxy: 71 ng/mL (ref 30.0–100.0)

## 2017-11-28 LAB — BMP8+EGFR
BUN / CREAT RATIO: 14 (ref 10–24)
BUN: 14 mg/dL (ref 8–27)
CHLORIDE: 102 mmol/L (ref 96–106)
CO2: 28 mmol/L (ref 20–29)
CREATININE: 1.01 mg/dL (ref 0.76–1.27)
Calcium: 9.4 mg/dL (ref 8.6–10.2)
GFR calc Af Amer: 85 mL/min/{1.73_m2} (ref >59)
GFR calc non Af Amer: 73 mL/min/{1.73_m2} (ref >59)
GLUCOSE: 120 mg/dL — AB (ref 65–99)
Potassium: 4.7 mmol/L (ref 3.5–5.2)
SODIUM: 141 mmol/L (ref 134–144)

## 2017-11-28 LAB — LYME AB/WESTERN BLOT REFLEX
LYME DISEASE AB, QUANT, IGM: <0.8 index (ref 0.00–0.79)
Lyme IgG/IgM Ab: <0.91 {ISR} (ref 0.00–0.90)

## 2017-11-28 LAB — LIPID PANEL
CHOLESTEROL TOTAL: 139 mg/dL (ref 100–199)
Chol/HDL Ratio: 3.2 ratio (ref 0.0–5.0)
HDL: 43 mg/dL (ref >39)
LDL Calculated: 69 mg/dL (ref 0–99)
TRIGLYCERIDES: 137 mg/dL (ref 0–149)
VLDL Cholesterol Cal: 27 mg/dL (ref 5–40)

## 2017-12-25 ENCOUNTER — Encounter: Payer: Self-pay | Admitting: *Deleted

## 2018-02-13 ENCOUNTER — Ambulatory Visit (INDEPENDENT_AMBULATORY_CARE_PROVIDER_SITE_OTHER): Payer: Medicare HMO | Admitting: *Deleted

## 2018-02-13 VITALS — BP 110/74 | HR 60 | Temp 98.2°F | Ht 66.0 in | Wt 261.0 lb

## 2018-02-13 DIAGNOSIS — Z0000 Encounter for general adult medical examination without abnormal findings: Secondary | ICD-10-CM

## 2018-02-13 NOTE — Progress Notes (Addendum)
Subjective:   [name] [name] is a 72 y.o. male who presents for an Initial Medicare Annual Wellness Visit. [name] lives with his wife. He is retired. 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 He has 3 children. He has no pets in the home. Fall hazards were discussed with patient. He has no stairs in the home just one step going into the home.   Review of Systems  Patient states his health is the same as it was last year at this time.   Cardiac Risk Factors include: advanced age (>50men, >1 women);dyslipidemia    Objective:    BP 110/74   Pulse 60   Temp 98.2 F (36.8 C) (Oral)   Ht 5\' 6"  (1.676 m)   Wt 261 lb (118.4 kg)   BMI 42.13 kg/m    Advanced Directives 02/13/2018 04/09/2014 04/05/2014  Does Patient Have a Medical Advance Directive? No No No  Would patient like information on creating a medical advance directive? Yes (MAU/Ambulatory/Procedural Areas - Information given) Yes - Educational materials given No - patient declined information    Current Medications (verified) Outpatient Encounter Medications as of 02/13/2018  Medication Sig  . amLODipine (NORVASC) 5 MG tablet Take 1 tablet (5 mg total) by mouth daily.  Marland Kitchen aspirin 81 MG tablet Take 81 mg by mouth daily.    . benazepril (LOTENSIN) 10 MG tablet Take 1 tablet (10 mg total) by mouth daily.  . Cholecalciferol (VITAMIN D-3) 5000 UNITS TABS Take 1 tablet by mouth daily.    . Garlic 5170 MG CAPS Take 2 capsules by mouth daily.  Marland Kitchen lactulose (CHRONULAC) 10 GM/15ML solution Take 15 mLs (10 g total) by mouth daily as needed for mild constipation.  . NON FORMULARY Take 1 tablet by mouth daily. Equate allergy tablet  . Omega-3 Fatty Acids (FISH OIL) 1000 MG CAPS Take 2 capsules by mouth daily.  Marland Kitchen omeprazole (PRILOSEC) 20 MG capsule Take 1 capsule (20 mg total) by mouth daily.  . pravastatin (PRAVACHOL) 40 MG tablet Take 1 tablet (40 mg total) by mouth daily.  . vitamin C (ASCORBIC ACID)  500 MG tablet Take 500 mg by mouth daily.  . [DISCONTINUED] doxycycline (VIBRA-TABS) 100 MG tablet Take 1 tablet (100 mg total) by mouth 2 (two) times daily. 1 po bid (Patient not taking: Reported on 02/13/2018)  . [DISCONTINUED] HYDROcodone-homatropine (HYCODAN) 5-1.5 MG/5ML syrup Take 5 mLs by mouth every 8 (eight) hours as needed for cough. (Patient not taking: Reported on 02/13/2018)   No facility-administered encounter medications on file as of 02/13/2018.     Allergies (verified) Cialis [tadalafil] and Viagra [sildenafil citrate]   History: Past Medical History:  Diagnosis Date  . Colon polyp   . GERD (gastroesophageal reflux disease)   . Hemorrhoid   . Hyperplasia of prostate   . Hypertension   . Other and unspecified hyperlipidemia   . Other testicular hypofunction   . Sinus bradycardia    Past Surgical History:  Procedure Laterality Date  . APPENDECTOMY    . HAND SURGERY Right    Family History  Problem Relation Age of Onset  . Heart attack Mother   . Stroke Mother   . Diabetes Father   . Diabetes Daughter    Social History   Socioeconomic History  . Marital status: Married    Spouse name: Not on file  . Number of children: 3  . Years of education: Not on file  . Highest  education level: Not on file  Occupational History  . Not on file  Social Needs  . Financial resource strain: Not hard at all  . Food insecurity:    Worry: Never true    Inability: Never true  . Transportation needs:    Medical: No    Non-medical: No  Tobacco Use  . Smoking status: Light Tobacco Smoker    Types: Cigars  . Smokeless tobacco: Current User    Types: Chew  Substance and Sexual Activity  . Alcohol use: Yes    Comment: occasional  . Drug use: No  . Sexual activity: Not Currently  Lifestyle  . Physical activity:    Days per week: Not on file    Minutes per session: Not on file  . Stress: Not at all  Relationships  . Social connections:    Talks on phone: More than  three times a week    Gets together: More than three times a week    Attends religious service: Never    Active member of club or organization: No    Attends meetings of clubs or organizations: Never    Relationship status: Married  Other Topics Concern  . Not on file  Social History Narrative  . Not on file   Tobacco Counseling Ready to quit: Not Answered Counseling given: Not Answered   Activities of Daily Living In your present state of health, do you have any difficulty performing the following activities: 02/13/2018  Hearing? Y  Comment Patient states he has trouble hearing at times  Vision? N  Comment wears glasses  Difficulty concentrating or making decisions? N  Walking or climbing stairs? Y  Comment due to knee pain   Dressing or bathing? N  Doing errands, shopping? N  Preparing Food and eating ? N  Using the Toilet? N  In the past six months, have you accidently leaked urine? N  Do you have problems with loss of bowel control? N  Managing your Medications? N  Managing your Finances? N  Housekeeping or managing your Housekeeping? N  Some recent data might be hidden     Immunizations and Health Maintenance Immunization History  Administered Date(s) Administered  . Pneumococcal Conjugate-13 05/03/2013  . Pneumococcal Polysaccharide-23 07/29/2009  . Td 07/30/2003   There are no preventive care reminders to display for this patient.  Patient Care Team: Chipper Herb, MD as PCP - General (Family Medicine)  Indicate any recent Medical Services you may have received from other than [facility] providers in the past year (date may be approximate).    Assessment:   This is a routine wellness examination for [employer].  Hearing/Vision screen No exam data present  Dietary issues and exercise activities discussed: Current Exercise Habits: The patient does not participate in regular exercise at present  Goals    . Exercise 3x per week (30 min per time)       Depression Screen PHQ 2/9 Scores 02/13/2018 11/26/2017 10/06/2017 07/16/2017  PHQ - 2 Score 0 0 0 0    Fall Risk Fall Risk  02/13/2018 11/26/2017 10/06/2017 07/16/2017 03/07/2017  Falls in the past year? No No No No No  Number falls in past yr: - - - - -  Injury with Fall? - - - - -    Is the patient's home free of loose throw rugs in walkways, pet beds, electrical cords, etc?   Yes      Grab bars in the bathroom? no  Handrails on the stairs?   yes      Adequate lighting?   yes  Timed Get Up and Go performed:   Cognitive Function: MMSE - Mini Mental State Exam 02/13/2018  Orientation to time 5  Orientation to Place 5  Registration 3  Attention/ Calculation 5  Recall 2  Language- name 2 objects 2  Language- repeat 1  Language- follow 3 step command 3  Language- read & follow direction 1  Write a sentence 1  Copy design 1  Total score 29        Screening Tests Health Maintenance  Topic Date Due  . Hepatitis C Screening  07/16/2018 (Originally Mar 13, 1946)  . TETANUS/TDAP  11/27/2018 (Originally 07/29/2013)  . INFLUENZA VACCINE  05/17/2020 (Originally 02/26/2018)  . COLON CANCER SCREENING ANNUAL FOBT  08/12/2018  . COLONOSCOPY  04/10/2020  . PNA vac Low Risk Adult  Completed    Qualifies for Shingles Vaccine?Yes but patient does not want at this time   Additional Screenings: Hepatitis C Screening:       Plan:  Patient will follow up with Dr. Laurance Flatten as planned We will check the cost of Tdap at next visit  I have personally reviewed and noted the following in the patient's chart:   . Medical and social history . Use of alcohol, tobacco or illicit drugs  . Current medications and supplements . Functional ability and status . Nutritional status . Physical activity . Advanced directives . List of other physicians . Hospitalizations, surgeries, and ER visits in previous 12 months . Vitals . Screenings to include cognitive, depression, and falls . Referrals and  appointments  In addition, I have reviewed and discussed with patient certain preventive protocols, quality metrics, and best practice recommendations. A written personalized care plan for preventive services as well as general preventive health recommendations were provided to patient.     Gareth Morgan, LPN   09/09/9415     I have reviewed and agree with the above AWV documentation.   Evelina Dun, FNP

## 2018-02-13 NOTE — Patient Instructions (Signed)
  [name] [name] , Thank you for taking time to come for your Medicare Wellness Visit. I appreciate your ongoing commitment to your health goals. Please review the following plan we discussed and let me know if I can assist you in the future.   These are the goals we discussed: Goals    . Exercise 3x per week (30 min per time)       This is a list of the screening recommended for you and due dates:  Health Maintenance  Topic Date Due  .  Hepatitis C: One time screening is recommended by Center for Disease Control  (CDC) for  adults born from [birth year] through 1967.   07/16/2018*  . Tetanus Vaccine  11/27/2018*  . Flu Shot  05/17/2020*  . Stool Blood Test  08/12/2018  . Colon Cancer Screening  04/10/2020  . Pneumonia vaccines  Completed  *Topic was postponed. The date shown is not the original due date.

## 2018-03-20 ENCOUNTER — Encounter: Payer: Self-pay | Admitting: *Deleted

## 2018-04-02 ENCOUNTER — Ambulatory Visit: Payer: Medicare HMO | Admitting: Family Medicine

## 2018-04-07 ENCOUNTER — Ambulatory Visit (INDEPENDENT_AMBULATORY_CARE_PROVIDER_SITE_OTHER): Payer: Medicare HMO | Admitting: Family Medicine

## 2018-04-07 ENCOUNTER — Encounter: Payer: Self-pay | Admitting: Family Medicine

## 2018-04-07 VITALS — BP 117/78 | HR 66 | Temp 98.5°F | Ht 66.0 in | Wt 261.0 lb

## 2018-04-07 DIAGNOSIS — M150 Primary generalized (osteo)arthritis: Secondary | ICD-10-CM | POA: Diagnosis not present

## 2018-04-07 DIAGNOSIS — I700 Atherosclerosis of aorta: Secondary | ICD-10-CM | POA: Diagnosis not present

## 2018-04-07 DIAGNOSIS — E7800 Pure hypercholesterolemia, unspecified: Secondary | ICD-10-CM

## 2018-04-07 DIAGNOSIS — M159 Polyosteoarthritis, unspecified: Secondary | ICD-10-CM

## 2018-04-07 DIAGNOSIS — E559 Vitamin D deficiency, unspecified: Secondary | ICD-10-CM | POA: Diagnosis not present

## 2018-04-07 DIAGNOSIS — K219 Gastro-esophageal reflux disease without esophagitis: Secondary | ICD-10-CM | POA: Diagnosis not present

## 2018-04-07 DIAGNOSIS — I10 Essential (primary) hypertension: Secondary | ICD-10-CM | POA: Diagnosis not present

## 2018-04-07 DIAGNOSIS — N400 Enlarged prostate without lower urinary tract symptoms: Secondary | ICD-10-CM

## 2018-04-07 NOTE — Patient Instructions (Addendum)
Medicare Annual Wellness Visit  [facility] and the medical providers at [facility] strive to bring you the best medical care.  In doing so we not only want to address your current medical conditions and concerns but also to detect new conditions early and prevent illness, disease and health-related problems.    Medicare offers a yearly Wellness Visit which allows our clinical staff to assess your need for preventative services including immunizations, lifestyle education, counseling to decrease risk of preventable diseases and screening for fall risk and other medical concerns.    This visit is provided free of charge (no copay) for all Medicare recipients. The clinical pharmacists at [facility] have begun to conduct these Wellness Visits which will also include a thorough review of all your medications.    As you primary medical provider recommend that you make an appointment for your Annual Wellness Visit if you have not done so already this year.  You may set up this appointment before you leave today or you may call back ([phone]) and schedule an appointment.  Please make sure when you call that you mention that you are scheduling your Annual Wellness Visit with the clinical pharmacist so that the appointment may be made for the proper length of time.     Continue current medications. Continue good therapeutic lifestyle changes which include good diet and exercise. Fall precautions discussed with patient. If an FOBT was given today- please return it to our front desk. If you are over 68 years old - you may need Prevnar 39 or the adult Pneumonia vaccine.  **Flu shots are available--- please call and schedule a FLU-CLINIC appointment**  After your visit with [place] today you will receive a survey in the mail or online from [employer] regarding your care with [place]. Please take a moment to fill this out. Your feedback is very  important to [place] as you can help [place] better understand your patient needs as well as improve your experience and satisfaction. WE CARE ABOUT YOU!!!  Please consider seeing Dr. Ricki Rodriguez who comes to this office for further evaluation of your knees.  If you change your mind and decide you would like to have an appointment to see him, please call my nurse and schedule this appointment. Continue to work on weight with diet and exercise to lose as much weight as possible Drink plenty of fluids and stay well-hydrated Be careful not to put yourself at risk for falling Do not forget to get a good eye exam and when you do make sure that we get a copy of that report

## 2018-04-07 NOTE — Progress Notes (Signed)
Subjective:    [name] ID: [name] [name], male    DOB: 1945/09/16, 72 y.o.   MRN: [id number]  HPI Pt here for follow up and management of chronic medical problems which includes hypertension and hyperlipidemia. He is taking medication regularly.  [name] [name] comes in for his regular visit today.  He has hypertension hyperlipidemia BPH erectile dysfunction and gastroesophageal reflux.  He is on amlodipine Lotensin omeprazole pravastatin baby aspirin and vitamin D along with omega-3 fatty acids.  [name] [name] is pleasant and doing well.  His biggest problem is his bilateral knee pain from working on concrete floors for many years.  He has trouble just walking on concrete because he says he has bone-on-bone issues in both knees.  He periodically goes to see flexor Genex and they inject his knees and then he feels better for a while but then has trouble again.  Because of [name] knee pain it is very limiting for him to do anything on his feet other than use [name] riding mower.  He would like to be out more.  I encouraged him to highly consider seeing 1 of [name] orthopedist that come to our office and he says he will consider that in [name] future.  He denies any chest pain pressure tightness shortness of breath or palpitations.  He denies any trouble with swallowing heartburn indigestion nausea vomiting diarrhea blood in [name] stool or black tarry bowel movements.  He has bowel movements about every 3 days and this is normal for him.  He is passing his water without problems.  He has not had an eye exam for several years and I encouraged him to go get a good eye exam.     [name] Active Problem List   Diagnosis Date Noted  . Aortic atherosclerosis ([place]) 03/07/2017  . B12 deficiency 12/31/2015  . Hyperlipidemia 07/01/2014  . Erectile dysfunction 02/28/2014  . Vitamin D deficiency 12/14/2012  . Testosterone deficiency   . Benign essential hypertension antepartum   . Sinus bradycardia   . GERD (gastroesophageal  reflux disease)   . Colon polyp history   . BPH (benign prostatic hyperplasia)    Outpatient Encounter Medications as of 04/07/2018  Medication Sig  . amLODipine (NORVASC) 5 MG tablet Take 1 tablet (5 mg total) by mouth daily.  Marland Kitchen aspirin 81 MG tablet Take 81 mg by mouth daily.    . benazepril (LOTENSIN) 10 MG tablet Take 1 tablet (10 mg total) by mouth daily.  . Cholecalciferol (VITAMIN D-3) 5000 UNITS TABS Take 1 tablet by mouth daily.    . Garlic 3419 MG CAPS Take 2 capsules by mouth daily.  Marland Kitchen lactulose (CHRONULAC) 10 GM/15ML solution Take 15 mLs (10 g total) by mouth daily as needed for mild constipation.  . NON FORMULARY Take 1 tablet by mouth daily. Equate allergy tablet  . Omega-3 Fatty Acids (FISH OIL) 1000 MG CAPS Take 2 capsules by mouth daily.  Marland Kitchen omeprazole (PRILOSEC) 20 MG capsule Take 1 capsule (20 mg total) by mouth daily.  . pravastatin (PRAVACHOL) 40 MG tablet Take 1 tablet (40 mg total) by mouth daily.  . vitamin C (ASCORBIC ACID) 500 MG tablet Take 500 mg by mouth daily.   No facility-administered encounter medications on file as of 04/07/2018.      Review of Systems  Constitutional: Negative.   HENT: Negative.   Eyes: Negative.   Respiratory: Negative.   Cardiovascular: Negative.   Gastrointestinal: Negative.   Endocrine: Negative.   Genitourinary: Negative.  Musculoskeletal: Negative.   Skin: Negative.   Allergic/Immunologic: Negative.   Neurological: Negative.   Hematological: Negative.   Psychiatric/Behavioral: Negative.        Objective:   Physical Exam  Constitutional: He is oriented to person, place, and time. He appears well-developed and well-nourished. No distress.  [name] [name] is pleasant and alert and calm and retired  HENT:  Head: Normocephalic and atraumatic.  Right Ear: External ear normal.  Left Ear: External ear normal.  Nose: Nose normal.  Mouth/Throat: Oropharynx is clear and moist. No oropharyngeal exudate.  Eyes: Pupils are equal,  round, and reactive to light. Conjunctivae and EOM are normal. Right eye exhibits no discharge. Left eye exhibits no discharge. No scleral icterus.  [name] is in need of eye exam and he was reminded of this again today during [name] visit.  Neck: Normal range of motion. Neck supple. No thyromegaly present.  No bruits thyromegaly or anterior cervical adenopathy  Cardiovascular: Normal rate, regular rhythm, normal heart sounds and intact distal pulses.  No murmur heard. [name] heart is regular at 60/min [name] [name] has good pedal pulses.  There is no edema.  Pulmonary/Chest: Effort normal and breath sounds normal. He has no wheezes. He has no rales. He exhibits no tenderness.  Clear anteriorly and posteriorly and no axillary adenopathy  Abdominal: Soft. Bowel sounds are normal. He exhibits no mass. There is no tenderness. There is no rebound and no guarding.  Abdominal obesity without masses tenderness organ enlargement or bruits.  Musculoskeletal: Normal range of motion. He exhibits tenderness. He exhibits no edema.  Knee stiffness and tenderness with joint line tenderness in each knee.  Lymphadenopathy:    He has no cervical adenopathy.  Neurological: He is alert and oriented to person, place, and time. He has normal reflexes. No cranial nerve deficit.  Lower extremity reflexes are good.  Skin: Skin is warm and dry. No rash noted.  Psychiatric: He has a normal mood and affect. His behavior is normal. Judgment and thought content normal.  [name] [name]'s mood affect and behavior are all normal.  Nursing note and vitals reviewed.  BP 117/78 (BP Location: Right Arm)   Pulse 66   Temp 98.5 F (36.9 C) (Oral)   Ht '5\' 6"'  (1.676 m)   Wt 261 lb (118.4 kg)   BMI 42.13 kg/m         Assessment & Plan:  1. Pure hypercholesterolemia -Continue current treatment and as aggressive therapeutic lifestyle changes as possible with [name] limitations [name] [name] has with his knees.  He should make every effort  to lose weight and get this down to a lower BMI. - CBC with Differential/Platelet - Lipid panel  2. Vitamin D deficiency -Continue current treatment pending results of lab work - CBC with Differential/Platelet - VITAMIN D 25 Hydroxy (Vit-D Deficiency, Fractures)  3. Essential hypertension -[name] blood pressure is good he will continue with current treatment - CBC with Differential/Platelet - BMP8+EGFR - Hepatic function panel  4. Benign prostatic hyperplasia, unspecified whether lower urinary tract symptoms present -No complaints today with voiding - CBC with Differential/Platelet  5. Gastroesophageal reflux disease without esophagitis -No complaints with reflux as long as he is taking his omeprazole and watching his diet - CBC with Differential/Platelet - Hepatic function panel  6. Aortic atherosclerosis ([place]) -Continue aggressive therapeutic lifestyle changes and pravastatin pending results of lab work - CBC with Differential/Platelet - Lipid panel  7. Morbid obesity ([place]) -Make every effort to lose weight through diet and  exercise as [name] morbid obesity increases [name] risk of heart disease stroke diabetes and makes [name] problems with your joints worse  8. Primary osteoarthritis involving multiple joints -This is worse in both knees.  [name] was encouraged to see orthopedic surgeon and said he will call back if he decides to do so.  [name] Instructions                       Medicare Annual Wellness Visit  [facility] and [name] medical providers at [facility] strive to bring you [name] best medical care.  In doing so we not only want to address your current medical conditions and concerns but also to detect new conditions early and prevent illness, disease and health-related problems.    Medicare offers a yearly Wellness Visit which allows our clinical staff to assess your need for preventative services including immunizations, lifestyle education, counseling  to decrease risk of preventable diseases and screening for fall risk and other medical concerns.    This visit is provided free of charge (no copay) for all Medicare recipients. [name] clinical pharmacists at [facility] have begun to conduct these Wellness Visits which will also include a thorough review of all your medications.    As you primary medical provider recommend that you make an appointment for your Annual Wellness Visit if you have not done so already this year.  You may set up this appointment before you leave today or you may call back ([phone]) and schedule an appointment.  Please make sure when you call that you mention that you are scheduling your Annual Wellness Visit with [name] clinical pharmacist so that [name] appointment may be made for [name] proper length of time.     Continue current medications. Continue good therapeutic lifestyle changes which include good diet and exercise. Fall precautions discussed with [name]. If an FOBT was given today- please return it to our front desk. If you are over 54 years old - you may need Prevnar 41 or [name] adult Pneumonia vaccine.  **Flu shots are available--- please call and schedule a FLU-CLINIC appointment**  After your visit with [place] today you will receive a survey in [name] mail or online from [employer] regarding your care with [place]. Please take a moment to fill this out. Your feedback is very important to [place] as you can help [place] better understand your [name] needs as well as improve your experience and satisfaction. WE CARE ABOUT YOU!!!  Please consider seeing Dr. Ricki Rodriguez who comes to this office for further evaluation of your knees.  If you change your mind and decide you would like to have an appointment to see him, please call my nurse and schedule this appointment. Continue to work on weight with diet and exercise to lose as much weight as possible Drink plenty of fluids and stay well-hydrated Be careful not to put  yourself at risk for falling Do not forget to get a good eye exam and when you do make sure that we get a copy of that report   Arrie Senate MD

## 2018-04-08 LAB — CBC WITH DIFFERENTIAL/PLATELET
Basophils Absolute: 0 10*3/uL (ref 0.0–0.2)
Basos: 1 %
EOS (ABSOLUTE): 0.3 10*3/uL (ref 0.0–0.4)
Eos: 4 %
HEMATOCRIT: 40.1 % (ref 37.5–51.0)
Hemoglobin: 14.1 g/dL (ref 13.0–17.7)
IMMATURE GRANULOCYTES: 0 %
Immature Grans (Abs): 0 10*3/uL (ref 0.0–0.1)
LYMPHS ABS: 2.6 10*3/uL (ref 0.7–3.1)
Lymphs: 35 %
MCH: 33.3 pg — ABNORMAL HIGH (ref 26.6–33.0)
MCHC: 35.2 g/dL (ref 31.5–35.7)
MCV: 95 fL (ref 79–97)
MONOS ABS: 0.7 10*3/uL (ref 0.1–0.9)
Monocytes: 9 %
NEUTROS ABS: 3.8 10*3/uL (ref 1.4–7.0)
Neutrophils: 51 %
Platelets: 191 10*3/uL (ref 150–450)
RBC: 4.24 x10E6/uL (ref 4.14–5.80)
RDW: 13.1 % (ref 12.3–15.4)
WBC: 7.4 10*3/uL (ref 3.4–10.8)

## 2018-04-08 LAB — HEPATIC FUNCTION PANEL
ALT: 19 IU/L (ref 0–44)
AST: 23 IU/L (ref 0–40)
Albumin: 4.1 g/dL (ref 3.5–4.8)
Alkaline Phosphatase: 47 IU/L (ref 39–117)
BILIRUBIN TOTAL: 0.6 mg/dL (ref 0.0–1.2)
Bilirubin, Direct: 0.17 mg/dL (ref 0.00–0.40)
Total Protein: 6.4 g/dL (ref 6.0–8.5)

## 2018-04-08 LAB — BMP8+EGFR
BUN/Creatinine Ratio: 15 (ref 10–24)
BUN: 16 mg/dL (ref 8–27)
CO2: 26 mmol/L (ref 20–29)
CREATININE: 1.04 mg/dL (ref 0.76–1.27)
Calcium: 9.2 mg/dL (ref 8.6–10.2)
Chloride: 101 mmol/L (ref 96–106)
GFR calc Af Amer: 82 mL/min/{1.73_m2} (ref >59)
GFR, EST NON AFRICAN AMERICAN: 71 mL/min/{1.73_m2} (ref >59)
Glucose: 112 mg/dL — ABNORMAL HIGH (ref 65–99)
POTASSIUM: 4.5 mmol/L (ref 3.5–5.2)
Sodium: 143 mmol/L (ref 134–144)

## 2018-04-08 LAB — LIPID PANEL
CHOL/HDL RATIO: 3.9 ratio (ref 0.0–5.0)
Cholesterol, Total: 147 mg/dL (ref 100–199)
HDL: 38 mg/dL — ABNORMAL LOW (ref >39)
LDL Calculated: 68 mg/dL (ref 0–99)
TRIGLYCERIDES: 203 mg/dL — AB (ref 0–149)
VLDL Cholesterol Cal: 41 mg/dL — ABNORMAL HIGH (ref 5–40)

## 2018-04-08 LAB — VITAMIN D 25 HYDROXY (VIT D DEFICIENCY, FRACTURES): Vit D, 25-Hydroxy: 64.6 ng/mL (ref 30.0–100.0)

## 2018-08-04 ENCOUNTER — Telehealth: Payer: Self-pay | Admitting: Family Medicine

## 2018-08-04 DIAGNOSIS — M25562 Pain in left knee: Principal | ICD-10-CM

## 2018-08-04 DIAGNOSIS — G8929 Other chronic pain: Secondary | ICD-10-CM

## 2018-08-04 DIAGNOSIS — M25561 Pain in right knee: Principal | ICD-10-CM

## 2018-08-04 NOTE — Telephone Encounter (Signed)
DWM,  FYI = I placed referral to [facility]

## 2018-08-14 ENCOUNTER — Other Ambulatory Visit: Payer: Self-pay | Admitting: Family Medicine

## 2018-08-24 ENCOUNTER — Ambulatory Visit: Payer: Medicare HMO | Admitting: Family Medicine

## 2018-09-09 ENCOUNTER — Encounter: Payer: Self-pay | Admitting: Family Medicine

## 2018-09-09 ENCOUNTER — Ambulatory Visit (INDEPENDENT_AMBULATORY_CARE_PROVIDER_SITE_OTHER): Payer: Medicare HMO | Admitting: Family Medicine

## 2018-09-09 VITALS — BP 127/79 | HR 59 | Temp 97.8°F | Ht 66.0 in | Wt 272.0 lb

## 2018-09-09 DIAGNOSIS — N400 Enlarged prostate without lower urinary tract symptoms: Secondary | ICD-10-CM

## 2018-09-09 DIAGNOSIS — M150 Primary generalized (osteo)arthritis: Secondary | ICD-10-CM | POA: Diagnosis not present

## 2018-09-09 DIAGNOSIS — I700 Atherosclerosis of aorta: Secondary | ICD-10-CM

## 2018-09-09 DIAGNOSIS — E7800 Pure hypercholesterolemia, unspecified: Secondary | ICD-10-CM | POA: Diagnosis not present

## 2018-09-09 DIAGNOSIS — I10 Essential (primary) hypertension: Secondary | ICD-10-CM

## 2018-09-09 DIAGNOSIS — K219 Gastro-esophageal reflux disease without esophagitis: Secondary | ICD-10-CM

## 2018-09-09 DIAGNOSIS — M159 Polyosteoarthritis, unspecified: Secondary | ICD-10-CM

## 2018-09-09 DIAGNOSIS — I739 Peripheral vascular disease, unspecified: Secondary | ICD-10-CM

## 2018-09-09 DIAGNOSIS — E559 Vitamin D deficiency, unspecified: Secondary | ICD-10-CM | POA: Diagnosis not present

## 2018-09-09 LAB — URINALYSIS, COMPLETE
Bilirubin, UA: NEGATIVE
GLUCOSE, UA: NEGATIVE
Ketones, UA: NEGATIVE
Nitrite, UA: NEGATIVE
Protein, UA: NEGATIVE
RBC, UA: NEGATIVE
Specific Gravity, UA: 1.02 (ref 1.005–1.030)
Urobilinogen, Ur: 1 mg/dL (ref 0.2–1.0)
pH, UA: 5.5 (ref 5.0–7.5)

## 2018-09-09 LAB — MICROSCOPIC EXAMINATION
Bacteria, UA: NONE SEEN
Epithelial Cells (non renal): NONE SEEN /hpf (ref 0–10)
RBC, UA: NONE SEEN /hpf (ref 0–2)
Renal Epithel, UA: NONE SEEN /hpf

## 2018-09-09 MED ORDER — LACTULOSE 10 GM/15ML PO SOLN: 10.0000 g | Freq: Every day | ORAL | 3 refills | Status: DC | PRN

## 2018-09-09 NOTE — Progress Notes (Signed)
Subjective:    Patient ID: [name] [name], male    DOB: [birth year], 73 y.o.   MRN: [id number]  HPI Pt here for follow up and management of chronic medical problems which includes hypertension and hyperlipidemia. He is taking medication regularly.  The patient is doing well overall with no specific complaints.  He is due to get his level for physical exam today.  He will get a urine lab work and will be given an FOBT to return.  His last colonoscopy was in 2011.  This patient has hyperlipidemia BPH erectile dysfunction reflux and aortic atherosclerosis.  He is taking amlodipine and benazepril omeprazole pravastatin and vitamin D replacement.  He is also on omega-3 fatty acids.  The family history is positive for diabetes stroke and heart disease.  Patient is retired and has confined himself more to his recliner.  He is gained 11 pounds since his last visit.  We had a long talk about that and he understands the importance of getting up and getting out and getting more exercise.  I believe he will try to do better.  He denies any chest pain pressure tightness or shortness of breath.  He does have some increased gas and bloating after eating most meals.  He is also had some on and off constipation and has been taking the lactulose for this.  He denies any blood in the stool or black tarry bowel movements.  He is passing his water well although having some frequency.  When asked about his eye exams he says it has been a long time since he had 1 and his wife says that he will not let her schedule him for one but I did encourage him to get a good eye exam.    Patient Active Problem List   Diagnosis Date Noted  . Aortic atherosclerosis ([place]) 03/07/2017  . B12 deficiency 12/31/2015  . Hyperlipidemia 07/01/2014  . Erectile dysfunction 02/28/2014  . Vitamin D deficiency 12/14/2012  . Testosterone deficiency   . Benign essential hypertension antepartum   . Sinus bradycardia   . GERD (gastroesophageal  reflux disease)   . Colon polyp history   . BPH (benign prostatic hyperplasia)    Outpatient Encounter Medications as of 09/09/2018  Medication Sig  . amLODipine (NORVASC) 5 MG tablet TAKE 1 TABLET EVERY DAY  . aspirin 81 MG tablet Take 81 mg by mouth daily.    . benazepril (LOTENSIN) 10 MG tablet TAKE 1 TABLET EVERY DAY  . Cholecalciferol (VITAMIN D-3) 5000 UNITS TABS Take 1 tablet by mouth daily.    . Garlic 8295 MG CAPS Take 2 capsules by mouth daily.  Marland Kitchen lactulose (CHRONULAC) 10 GM/15ML solution Take 15 mLs (10 g total) by mouth daily as needed for mild constipation.  . NON FORMULARY Take 1 tablet by mouth daily. Equate allergy tablet  . Omega-3 Fatty Acids (FISH OIL) 1000 MG CAPS Take 3 capsules by mouth daily.   Marland Kitchen omeprazole (PRILOSEC) 20 MG capsule TAKE 1 CAPSULE EVERY DAY  . pravastatin (PRAVACHOL) 40 MG tablet TAKE 1 TABLET EVERY DAY  . vitamin C (ASCORBIC ACID) 500 MG tablet Take 500 mg by mouth daily.   No facility-administered encounter medications on file as of 09/09/2018.      Review of Systems  Constitutional: Negative.   HENT: Negative.   Eyes: Negative.   Respiratory: Negative.   Cardiovascular: Negative.   Gastrointestinal: Negative.   Endocrine: Negative.   Genitourinary: Negative.   Musculoskeletal: Negative.  Skin: Negative.   Allergic/Immunologic: Negative.   Neurological: Negative.   Hematological: Negative.   Psychiatric/Behavioral: Negative.        Objective:   Physical Exam Vitals signs and nursing note reviewed.  Constitutional:      General: He is not in acute distress.    Appearance: He is well-developed. He is obese.     Comments: The patient is pleasant and calm and needs motivation to get up and get active again even though he has bilateral knee pain.  HENT:     Head: Normocephalic and atraumatic.     Right Ear: Tympanic membrane and external ear normal. There is no impacted cerumen.     Left Ear: Tympanic membrane, ear canal and external  ear normal. There is no impacted cerumen.     Nose: Nose normal. No congestion or rhinorrhea.     Comments: Minimal nasal congestion bilaterally without rhinorrhea    Mouth/Throat:     Mouth: Mucous membranes are moist.     Pharynx: Oropharynx is clear. No oropharyngeal exudate.  Eyes:     General: No scleral icterus.       Right eye: No discharge.        Left eye: No discharge.     Extraocular Movements: Extraocular movements intact.     Conjunctiva/sclera: Conjunctivae normal.     Pupils: Pupils are equal, round, and reactive to light.     Comments: Patient is reminded that he needs to get a good eye exam.  Neck:     Musculoskeletal: Normal range of motion and neck supple.     Thyroid: No thyromegaly.     Vascular: No carotid bruit.     Trachea: No tracheal deviation.     Comments: No bruits thyromegaly or anterior cervical adenopathy Cardiovascular:     Rate and Rhythm: Normal rate and regular rhythm.     Heart sounds: Normal heart sounds. No murmur.     Comments: The heart is regular at 60/min with diminished pedal pulses bilaterally and no edema Pulmonary:     Effort: Pulmonary effort is normal.     Breath sounds: Normal breath sounds. No wheezing or rales.     Comments: Good breath sounds bilaterally, no axillary adenopathy no chest wall masses or tenderness Chest:     Chest wall: No tenderness.  Abdominal:     General: Bowel sounds are normal. There is distension.     Palpations: Abdomen is soft. There is no mass.     Tenderness: There is no abdominal tenderness.     Comments: Morbidly obese without masses or organ enlargement or bruits or inguinal adenopathy  Genitourinary:    Penis: Normal.      Scrotum/Testes: Normal.     Rectum: Normal.     Comments: The prostate is slightly enlarged without lumps or masses.  There are no rectal masses.  External genitalia were within normal limits with no inguinal hernias being palpated. Musculoskeletal: Normal range of motion.          General: Tenderness present.     Right lower leg: No edema.     Left lower leg: No edema.     Comments: Both knees were tender particularly around the patella on both sides.  There was minimal joint line tenderness.  Lymphadenopathy:     Cervical: No cervical adenopathy.  Skin:    General: Skin is warm and dry.     Findings: No rash.  Neurological:     General: No  focal deficit present.     Mental Status: He is alert and oriented to person, place, and time. Mental status is at baseline.     Cranial Nerves: No cranial nerve deficit.     Deep Tendon Reflexes: Reflexes are normal and symmetric. Reflexes normal.     Comments: Reflexes were decreased bilaterally but equal bilaterally.  Psychiatric:        Mood and Affect: Mood normal.        Behavior: Behavior normal.        Thought Content: Thought content normal.        Judgment: Judgment normal.     Comments: Mood affect and behavior for this patient were all normal.     BP 127/79 (BP Location: Left Arm)   Pulse (!) 59   Temp 97.8 F (36.6 C) (Oral)   Ht _0  (1.676 m)   Wt 272 lb (123.4 kg)   BMI 43.90 kg/m        Assessment & Plan:  1. Pure hypercholesterolemia -Continue current treatment pending results of lab work - EKG 12-Lead - CBC with Differential/Platelet - Lipid panel  2. Essential hypertension -Blood pressure is good today and he will continue with current treatment - EKG 12-Lead - BMP8+EGFR - CBC with Differential/Platelet - Hepatic function panel  3. Vitamin D deficiency -Continue with vitamin D replacement and calcium pending results of lab work - CBC with Differential/Platelet - VITAMIN D 25 Hydroxy (Vit-D Deficiency, Fractures)  4. Benign prostatic hyperplasia, unspecified whether lower urinary tract symptoms present -Prostate is slightly enlarged but no lumps or masses  - CBC with Differential/Platelet - PSA, total and free - Urinalysis, Complete  5. Gastroesophageal reflux disease  without esophagitis -Continue with omeprazole and try to lose weight through diet and exercise - CBC with Differential/Platelet  6. Aortic atherosclerosis ([facility]) -Continue statin therapy as aggressively as possible to include weight loss with diet and exercise - EKG 12-Lead - CBC with Differential/Platelet  7. Morbid obesity ([place]) -Lose weight through diet and exercise  8. Primary osteoarthritis involving multiple joints -Follow-up with orthopedist regarding bilateral knee pain  9. Peripheral vascular insufficiency ([facility]) -Walk and exercise more regularly  Meds ordered this encounter  Medications  . lactulose (CHRONULAC) 10 GM/15ML solution    Sig: Take 15 mLs (10 g total) by mouth daily as needed for mild constipation.    Dispense:  947 mL    Refill:  3   Patient Instructions                       Medicare Annual Wellness Visit  [facility] and the medical providers at [facility] strive to bring you the best medical care.  In doing so we not only want to address your current medical conditions and concerns but also to detect new conditions early and prevent illness, disease and health-related problems.    Medicare offers a yearly Wellness Visit which allows our clinical staff to assess your need for preventative services including immunizations, lifestyle education, counseling to decrease risk of preventable diseases and screening for fall risk and other medical concerns.    This visit is provided free of charge (no copay) for all Medicare recipients. The clinical pharmacists at [facility] have begun to conduct these Wellness Visits which will also include a thorough review of all your medications.    As you primary medical provider recommend that you make an appointment for your Annual Wellness  Visit if you have not done so already this year.  You may set up this appointment before you leave today or you may call back ([phone]) and  schedule an appointment.  Please make sure when you call that you mention that you are scheduling your Annual Wellness Visit with the clinical pharmacist so that the appointment may be made for the proper length of time.     Continue current medications. Continue good therapeutic lifestyle changes which include good diet and exercise. Fall precautions discussed with patient. If an FOBT was given today- please return it to our front desk. If you are over 45 years old - you may need Prevnar 40 or the adult Pneumonia vaccine.  **Flu shots are available--- please call and schedule a FLU-CLINIC appointment**  After your visit with [place] today you will receive a survey in the mail or online from [employer] regarding your care with [place]. Please take a moment to fill this out. Your feedback is very important to [place] as you can help [place] better understand your patient needs as well as improve your experience and satisfaction. WE CARE ABOUT YOU!!!   Continue with current treatment pending results of lab work We will call as soon as the lab work becomes available You will be due your next colonoscopy in the fall 2021.  If the gastroenterologist does not call you call us and we will call him to schedule this. For the rest of the winter, drink plenty of fluids and stay well-hydrated and keep the house as cool as possible Always do everything you can do to prevent accidents and falls and avoid climbing as much as possible It is safer to take Tylenol for aches pains and fever and avoid NSAIDs. Please get eye exam Follow-up with orthopedist as planned about knees     Arrie Senate MD

## 2018-09-09 NOTE — Patient Instructions (Addendum)
Medicare Annual Wellness Visit  [facility] and the medical providers at [facility] strive to bring you the best medical care.  In doing so we not only want to address your current medical conditions and concerns but also to detect new conditions early and prevent illness, disease and health-related problems.    Medicare offers a yearly Wellness Visit which allows our clinical staff to assess your need for preventative services including immunizations, lifestyle education, counseling to decrease risk of preventable diseases and screening for fall risk and other medical concerns.    This visit is provided free of charge (no copay) for all Medicare recipients. The clinical pharmacists at [facility] have begun to conduct these Wellness Visits which will also include a thorough review of all your medications.    As you primary medical provider recommend that you make an appointment for your Annual Wellness Visit if you have not done so already this year.  You may set up this appointment before you leave today or you may call back ([phone]) and schedule an appointment.  Please make sure when you call that you mention that you are scheduling your Annual Wellness Visit with the clinical pharmacist so that the appointment may be made for the proper length of time.     Continue current medications. Continue good therapeutic lifestyle changes which include good diet and exercise. Fall precautions discussed with patient. If an FOBT was given today- please return it to our front desk. If you are over 45 years old - you may need Prevnar 54 or the adult Pneumonia vaccine.  **Flu shots are available--- please call and schedule a FLU-CLINIC appointment**  After your visit with [place] today you will receive a survey in the mail or online from [employer] regarding your care with [place]. Please take a moment to fill this out. Your feedback is very  important to [place] as you can help [place] better understand your patient needs as well as improve your experience and satisfaction. WE CARE ABOUT YOU!!!   Continue with current treatment pending results of lab work We will call as soon as the lab work becomes available You will be due your next colonoscopy in the fall 2021.  If the gastroenterologist does not call you call us and we will call him to schedule this. For the rest of the winter, drink plenty of fluids and stay well-hydrated and keep the house as cool as possible Always do everything you can do to prevent accidents and falls and avoid climbing as much as possible It is safer to take Tylenol for aches pains and fever and avoid NSAIDs. Please get eye exam Follow-up with orthopedist as planned about knees

## 2018-09-10 LAB — CBC WITH DIFFERENTIAL/PLATELET
BASOS ABS: 0.1 10*3/uL (ref 0.0–0.2)
Basos: 1 %
EOS (ABSOLUTE): 0.2 10*3/uL (ref 0.0–0.4)
Eos: 4 %
Hematocrit: 40.3 % (ref 37.5–51.0)
Hemoglobin: 14 g/dL (ref 13.0–17.7)
Immature Grans (Abs): 0 10*3/uL (ref 0.0–0.1)
Immature Granulocytes: 0 %
Lymphocytes Absolute: 1.9 10*3/uL (ref 0.7–3.1)
Lymphs: 29 %
MCH: 32.6 pg (ref 26.6–33.0)
MCHC: 34.7 g/dL (ref 31.5–35.7)
MCV: 94 fL (ref 79–97)
Monocytes Absolute: 0.5 10*3/uL (ref 0.1–0.9)
Monocytes: 7 %
NEUTROS ABS: 4.1 10*3/uL (ref 1.4–7.0)
Neutrophils: 59 %
Platelets: 207 10*3/uL (ref 150–450)
RBC: 4.3 x10E6/uL (ref 4.14–5.80)
RDW: 12.4 % (ref 11.6–15.4)
WBC: 6.8 10*3/uL (ref 3.4–10.8)

## 2018-09-10 LAB — BMP8+EGFR
BUN / CREAT RATIO: 13 (ref 10–24)
BUN: 13 mg/dL (ref 8–27)
CO2: 26 mmol/L (ref 20–29)
Calcium: 9.4 mg/dL (ref 8.6–10.2)
Chloride: 100 mmol/L (ref 96–106)
Creatinine, Ser: 0.97 mg/dL (ref 0.76–1.27)
GFR calc Af Amer: 89 mL/min/{1.73_m2} (ref >59)
GFR calc non Af Amer: 77 mL/min/{1.73_m2} (ref >59)
Glucose: 102 mg/dL — ABNORMAL HIGH (ref 65–99)
Potassium: 4.3 mmol/L (ref 3.5–5.2)
Sodium: 142 mmol/L (ref 134–144)

## 2018-09-10 LAB — HEPATIC FUNCTION PANEL
ALK PHOS: 53 IU/L (ref 39–117)
ALT: 18 IU/L (ref 0–44)
AST: 23 IU/L (ref 0–40)
Albumin: 4.2 g/dL (ref 3.7–4.7)
BILIRUBIN TOTAL: 0.4 mg/dL (ref 0.0–1.2)
Bilirubin, Direct: 0.17 mg/dL (ref 0.00–0.40)
Total Protein: 6.5 g/dL (ref 6.0–8.5)

## 2018-09-10 LAB — LIPID PANEL
Chol/HDL Ratio: 3 ratio (ref 0.0–5.0)
Cholesterol, Total: 130 mg/dL (ref 100–199)
HDL: 43 mg/dL (ref >39)
LDL Calculated: 62 mg/dL (ref 0–99)
Triglycerides: 125 mg/dL (ref 0–149)
VLDL CHOLESTEROL CAL: 25 mg/dL (ref 5–40)

## 2018-09-10 LAB — VITAMIN D 25 HYDROXY (VIT D DEFICIENCY, FRACTURES): Vit D, 25-Hydroxy: 61.5 ng/mL (ref 30.0–100.0)

## 2018-09-10 LAB — PSA, TOTAL AND FREE
PSA, Free Pct: 16.3 %
PSA, Free: 0.49 ng/mL
Prostate Specific Ag, Serum: 3 ng/mL (ref 0.0–4.0)

## 2018-09-10 MED ORDER — LACTULOSE 10 GM/15ML PO SOLN: 10.0000 g | Freq: Every day | ORAL | 3 refills | Status: DC | PRN

## 2018-09-10 NOTE — Addendum Note (Signed)
Addended by: Antonietta Barcelona D on: 09/10/2018 11:06 AM   Modules accepted: Orders

## 2018-09-16 DIAGNOSIS — H2513 Age-related nuclear cataract, bilateral: Secondary | ICD-10-CM | POA: Diagnosis not present

## 2018-09-16 DIAGNOSIS — H40033 Anatomical narrow angle, bilateral: Secondary | ICD-10-CM | POA: Diagnosis not present

## 2018-12-17 ENCOUNTER — Other Ambulatory Visit: Payer: Self-pay | Admitting: Family Medicine

## 2019-01-22 ENCOUNTER — Ambulatory Visit: Payer: Medicare HMO | Admitting: Family Medicine

## 2019-01-22 DIAGNOSIS — M1711 Unilateral primary osteoarthritis, right knee: Secondary | ICD-10-CM | POA: Diagnosis not present

## 2019-01-22 DIAGNOSIS — M1712 Unilateral primary osteoarthritis, left knee: Secondary | ICD-10-CM | POA: Diagnosis not present

## 2019-01-22 DIAGNOSIS — M170 Bilateral primary osteoarthritis of knee: Secondary | ICD-10-CM | POA: Diagnosis not present

## 2019-02-19 ENCOUNTER — Encounter: Payer: Self-pay | Admitting: *Deleted

## 2019-02-19 ENCOUNTER — Ambulatory Visit (INDEPENDENT_AMBULATORY_CARE_PROVIDER_SITE_OTHER): Payer: Medicare HMO | Admitting: *Deleted

## 2019-02-19 ENCOUNTER — Other Ambulatory Visit: Payer: Self-pay

## 2019-02-19 DIAGNOSIS — Z0000 Encounter for general adult medical examination without abnormal findings: Secondary | ICD-10-CM

## 2019-02-19 NOTE — Progress Notes (Signed)
MEDICARE ANNUAL WELLNESS VISIT  02/19/2019  Telephone Visit Disclaimer This Medicare AWV was conducted by telephone due to national recommendations for restrictions regarding the COVID-19 Pandemic (e.g. social distancing).  I verified, using two identifiers, that I am speaking with [name] [name] or their authorized healthcare agent. I discussed the limitations, risks, security, and privacy concerns of performing an evaluation and management service by telephone and the potential availability of an in-person appointment in the future. The patient expressed understanding and agreed to proceed.   Subjective:  TAEGAN [name] is a 73 y.o. male patient of Dettinger, Fransisca Kaufmann, MD who had a Medicare Annual Wellness Visit today via telephone. Tylerjames is Retired and lives with their spouse. he has 3 children. he reports that he is socially active and does interact with friends/family regularly. he is not physically active and enjoys watching TV.  Patient Care Team: Dettinger, Fransisca Kaufmann, MD as PCP - General (Family Medicine)  Advanced Directives 02/19/2019 02/13/2018 04/09/2014 04/05/2014  Does Patient Have a Medical Advance Directive? No No No No  Would patient like information on creating a medical advance directive? No - Patient declined Yes (MAU/Ambulatory/Procedural Areas - Information given) Yes - Educational materials given No - patient declined information    Hospital Utilization Over the Past 12 Months: # of hospitalizations or ER visits: 0 # of surgeries: 0  Review of Systems    Patient reports that his overall health is unchanged compared to last year.  Patient Reported Readings (BP, Pulse, CBG, Weight, etc) none  Review of Systems: No complaints  All other systems negative.  Pain Assessment Pain : No/denies pain     Current Medications & Allergies (verified) Allergies as of 02/19/2019      Reactions   Cialis [tadalafil]    Viagra [sildenafil Citrate] Other (See Comments)    heartburn      Medication List       Accurate as of February 19, 2019 11:10 AM. If you have any questions, ask your nurse or doctor.        amLODipine 5 MG tablet Commonly known as: NORVASC TAKE 1 TABLET EVERY DAY   aspirin 81 MG tablet Take 81 mg by mouth daily.   benazepril 10 MG tablet Commonly known as: LOTENSIN TAKE 1 TABLET EVERY DAY   Fish Oil 1000 MG Caps Take 3 capsules by mouth daily.   Garlic 3748 MG Caps Take 2 capsules by mouth daily.   lactulose 10 GM/15ML solution Commonly known as: CHRONULAC Take 15 mLs (10 g total) by mouth daily as needed for mild constipation.   NON FORMULARY Take 1 tablet by mouth daily. Equate allergy tablet   omeprazole 20 MG capsule Commonly known as: PRILOSEC TAKE 1 CAPSULE EVERY DAY   pravastatin 40 MG tablet Commonly known as: PRAVACHOL TAKE 1 TABLET EVERY DAY   vitamin C 500 MG tablet Commonly known as: ASCORBIC ACID Take 500 mg by mouth daily.   Vitamin D-3 125 MCG (5000 UT) Tabs Take 1 tablet by mouth daily.       History (reviewed): Past Medical History:  Diagnosis Date  . Colon polyp   . GERD (gastroesophageal reflux disease)   . Hemorrhoid   . Hyperplasia of prostate   . Hypertension   . Other and unspecified hyperlipidemia   . Other testicular hypofunction   . Sinus bradycardia    Past Surgical History:  Procedure Laterality Date  . APPENDECTOMY    . HAND SURGERY Right  Family History  Problem Relation Age of Onset  . Heart attack Mother   . Stroke Mother   . Diabetes Father   . Diabetes Daughter    Social History   Socioeconomic History  . Marital status: Married    Spouse name: Cathleen  . Number of children: 3  . Years of education: 10  . Highest education level: 10th grade  Occupational History  . Occupation: retired  Scientific laboratory technician  . Financial resource strain: Not hard at all  . Food insecurity    Worry: Never true    Inability: Never true  . Transportation needs     Medical: No    Non-medical: No  Tobacco Use  . Smoking status: Light Tobacco Smoker    Types: Cigars  . Smokeless tobacco: Current User    Types: Chew  Substance and Sexual Activity  . Alcohol use: Yes    Comment: occasional  . Drug use: No  . Sexual activity: Not Currently  Lifestyle  . Physical activity    Days per week: 0 days    Minutes per session: 0 min  . Stress: Not at all  Relationships  . Social connections    Talks on phone: More than three times a week    Gets together: More than three times a week    Attends religious service: Never    Active member of club or organization: No    Attends meetings of clubs or organizations: Never    Relationship status: Married  Other Topics Concern  . Not on file  Social History Narrative  . Not on file    Activities of Daily Living In your present state of health, do you have any difficulty performing the following activities: 02/19/2019  Hearing? Y  Comment people have to repeat themselves at times, has never had professional evaluation  Vision? N  Difficulty concentrating or making decisions? N  Walking or climbing stairs? N  Dressing or bathing? N  Doing errands, shopping? N  Preparing Food and eating ? N  Using the Toilet? N  In the past six months, have you accidently leaked urine? N  Do you have problems with loss of bowel control? N  Managing your Medications? N  Managing your Finances? N  Housekeeping or managing your Housekeeping? N  Some recent data might be hidden    Patient Education/ Literacy How often do you need to have someone help you when you read instructions, pamphlets, or other written materials from your doctor or pharmacy?: 1 - Never What is the last grade level you completed in school?: 10th grade  Exercise Current Exercise Habits: The patient does not participate in regular exercise at present, Exercise limited by: None identified  Diet Patient reports consuming 3 meals a day and 2  snack(s) a day Patient reports that his primary diet is: Regular Patient reports that she does have regular access to food.   Depression Screen PHQ 2/9 Scores 02/19/2019 09/09/2018 04/07/2018 02/13/2018 11/26/2017 10/06/2017 07/16/2017  PHQ - 2 Score 0 0 0 0 0 0 0     Fall Risk Fall Risk  02/19/2019 09/09/2018 04/07/2018 02/13/2018 11/26/2017  Falls in the past year? 0 0 No No No  Number falls in past yr: - - - - -  Injury with Fall? - - - - -     Objective:  [name] [name] seemed alert and oriented and he participated appropriately during our telephone visit.  Blood Pressure Weight BMI  BP Readings  from Last 3 Encounters:  09/09/18 127/79  04/07/18 117/78  02/16/18 110/74   Wt Readings from Last 3 Encounters:  09/09/18 272 lb (123.4 kg)  04/07/18 261 lb (118.4 kg)  02/16/18 261 lb (118.4 kg)   BMI Readings from Last 1 Encounters:  09/09/18 43.90 kg/m    *Unable to obtain current vital signs, weight, and BMI due to telephone visit type  Hearing/Vision  . Eithan did not seem to have difficulty with hearing/understanding during the telephone conversation . Reports that he has not had a formal eye exam by an eye care professional within the past year . Reports that he has not had a formal hearing evaluation within the past year *Unable to fully assess hearing and vision during telephone visit type  Cognitive Function: 6CIT Screen 02/19/2019  What Year? 0 points  What month? 0 points  What time? 0 points  Count back from 20 0 points  Months in reverse 0 points  Repeat phrase 4 points  Total Score 4   (Normal:0-7, Significant for Dysfunction: >8)  Normal Cognitive Function Screening: Yes   Immunization & Health Maintenance Record Immunization History  Administered Date(s) Administered  . Pneumococcal Conjugate-13 05/03/2013  . Pneumococcal Polysaccharide-23 07/29/2009  . Td 07/30/2003    Health Maintenance  Topic Date Due  . Hepatitis C Screening  04/11/1946  .  TETANUS/TDAP  07/29/2013  . COLON CANCER SCREENING ANNUAL FOBT  08/12/2018  . INFLUENZA VACCINE  05/17/2020 (Originally 02/27/2019)  . COLONOSCOPY  04/10/2020  . PNA vac Low Risk Adult  Completed       Assessment  This is a routine wellness examination for [name] [name].  Health Maintenance: Due or Overdue Health Maintenance Due  Topic Date Due  . Hepatitis C Screening  [birth year]  . TETANUS/TDAP  07/29/2013  . COLON CANCER SCREENING ANNUAL FOBT  08/12/2018    [name] [name] does not need a referral for Community Assistance: Care Management:   no Social Work:    no Prescription Assistance:  no Nutrition/Diabetes Education:  no   Plan:  Personalized Goals Goals Addressed            This Visit's Progress   . DIET - INCREASE WATER INTAKE       Try to drink 6-8 glasses of water daily.      Personalized Health Maintenance & Screening Recommendations  Td vaccine Colorectal cancer screening Shingles  Lung Cancer Screening Recommended: no (Low Dose CT Chest recommended if Age 36-80 years, 30 pack-year currently smoking OR have quit w/in past 15 years) Hepatitis C Screening recommended: yes HIV Screening recommended: no  Advanced Directives: Written information was not prepared per patient's request.  Referrals & Orders No orders of the defined types were placed in this encounter.   Follow-up Plan . Follow-up with Dettinger, Fransisca Kaufmann, MD as planned . Consider TDAP and Shingles vaccines at your next visit with your PCP   I have personally reviewed and noted the following in the patient's chart:   . Medical and social history . Use of alcohol, tobacco or illicit drugs  . Current medications and supplements . Functional ability and status . Nutritional status . Physical activity . Advanced directives . List of other physicians . Hospitalizations, surgeries, and ER visits in previous 12 months . Vitals . Screenings to include cognitive, depression, and falls  . Referrals and appointments  In addition, I have reviewed and discussed with [name] [name] certain preventive protocols, quality metrics, and best practice  recommendations. A written personalized care plan for preventive services as well as general preventive health recommendations is available and can be mailed to the patient at his request.      Marylin Crosby, LPN  [id number]/[id number]

## 2019-02-19 NOTE — Patient Instructions (Signed)
Preventive Care 75 Years and Older, Male Preventive care refers to lifestyle choices and visits with your health care provider that can promote health and wellness. This includes:  A yearly physical exam. This is also called an annual well check.  Regular dental and eye exams.  Immunizations.  Screening for certain conditions.  Healthy lifestyle choices, such as diet and exercise. What can I expect for my preventive care visit? Physical exam Your health care provider will check:  Height and weight. These may be used to calculate body mass index (BMI), which is a measurement that tells if you are at a healthy weight.  Heart rate and blood pressure.  Your skin for abnormal spots. Counseling Your health care provider may ask you questions about:  Alcohol, tobacco, and drug use.  Emotional well-being.  Home and relationship well-being.  Sexual activity.  Eating habits.  History of falls.  Memory and ability to understand (cognition).  Work and work Statistician. What immunizations do I need?  Influenza (flu) vaccine  This is recommended every year. Tetanus, diphtheria, and pertussis (Tdap) vaccine  You may need a Td booster every 10 years. Varicella (chickenpox) vaccine  You may need this vaccine if you have not already been vaccinated. Zoster (shingles) vaccine  You may need this after age 50. Pneumococcal conjugate (PCV13) vaccine  One dose is recommended after age 24. Pneumococcal polysaccharide (PPSV23) vaccine  One dose is recommended after age 33. Measles, mumps, and rubella (MMR) vaccine  You may need at least one dose of MMR if you were born in 1957 or later. You may also need a second dose. Meningococcal conjugate (MenACWY) vaccine  You may need this if you have certain conditions. Hepatitis A vaccine  You may need this if you have certain conditions or if you travel or work in places where you may be exposed to hepatitis A. Hepatitis B vaccine   You may need this if you have certain conditions or if you travel or work in places where you may be exposed to hepatitis B. Haemophilus influenzae type b (Hib) vaccine  You may need this if you have certain conditions. You may receive vaccines as individual doses or as more than one vaccine together in one shot (combination vaccines). Talk with your health care provider about the risks and benefits of combination vaccines. What tests do I need? Blood tests  Lipid and cholesterol levels. These may be checked every 5 years, or more frequently depending on your overall health.  Hepatitis C test.  Hepatitis B test. Screening  Lung cancer screening. You may have this screening every year starting at age 74 if you have a 30-pack-year history of smoking and currently smoke or have quit within the past 15 years.  Colorectal cancer screening. All adults should have this screening starting at age 57 and continuing until age 54. Your health care provider may recommend screening at age 47 if you are at increased risk. You will have tests every 1-10 years, depending on your results and the type of screening test.  Prostate cancer screening. Recommendations will vary depending on your family history and other risks.  Diabetes screening. This is done by checking your blood sugar (glucose) after you have not eaten for a while (fasting). You may have this done every 1-3 years.  Abdominal aortic aneurysm (AAA) screening. You may need this if you are a current or former smoker.  Sexually transmitted disease (STD) testing. Follow these instructions at home: Eating and drinking  Eat  a diet that includes fresh fruits and vegetables, whole grains, lean protein, and low-fat dairy products. Limit your intake of foods with high amounts of sugar, saturated fats, and salt.  Take vitamin and mineral supplements as recommended by your health care provider.  Do not drink alcohol if your health care provider  tells you not to drink.  If you drink alcohol: ? Limit how much you have to 0-2 drinks a day. ? Be aware of how much alcohol is in your drink. In the [place] one drink equals one 12 oz bottle of beer (355 mL), one 5 oz glass of wine (148 mL), or one 1 oz glass of hard liquor (44 mL). Lifestyle  Take daily care of your teeth and gums.  Stay active. Exercise for at least 30 minutes on 5 or more days each week.  Do not use any products that contain nicotine or tobacco, such as cigarettes, e-cigarettes, and chewing tobacco. If you need help quitting, ask your health care provider.  If you are sexually active, practice safe sex. Use a condom or other form of protection to prevent STIs (sexually transmitted infections).  Talk with your health care provider about taking a low-dose aspirin or statin. What's next?  Visit your health care provider once a year for a well check visit.  Ask your health care provider how often you should have your eyes and teeth checked.  Stay up to date on all vaccines. This information is not intended to replace advice given to you by your health care provider. Make sure you discuss any questions you have with your health care provider. Document Released: 08/11/2015 Document Revised: 07/09/2018 Document Reviewed: 07/09/2018 Elsevier Patient Education  2020 Elsevier Inc.  

## 2019-03-09 ENCOUNTER — Telehealth: Payer: Self-pay | Admitting: Family Medicine

## 2019-03-09 NOTE — Chronic Care Management (AMB) (Signed)
°  Chronic Care Management   Outreach Note  03/09/2019 Name: [name] [name] MRN: [id number] DOB: 06/29/1946  Referred by: Dettinger, Fransisca Kaufmann, MD Reason for referral : Chronic Care Management (Initial CCM outreach was unsuccessful )   An unsuccessful telephone outreach was attempted today. The patient was referred to the case management team by for assistance with chronic care management and care coordination.   Follow Up Plan: A HIPPA compliant phone message was left for the patient providing contact information and requesting a return call.  The care management team will reach out to the patient again over the next 7 days.  If patient returns call to provider office, please advise to call [place] at [place]  [email or web address]   ??[phone]

## 2019-03-15 NOTE — Chronic Care Management (AMB) (Signed)
Chronic Care Management   Note  03/15/2019 Name: TERRIUS GENTILE MRN: [id number] DOB: Jul 26, 1946  GAYLE COLLARD is a 73 y.o. year old male who is a primary care patient of Dettinger, Fransisca Kaufmann, MD. I reached out to [name] by phone today in response to a referral sent by Mr. Bertram Gala Grillo's health plan.    Mr. Carmen was given information about Chronic Care Management services today including:  1. CCM service includes personalized support from designated clinical staff supervised by his physician, including individualized plan of care and coordination with other care providers 2. 24/7 contact phone numbers for assistance for urgent and routine care needs. 3. Service will only be billed when office clinical staff spend 20 minutes or more in a month to coordinate care. 4. Only one practitioner may furnish and bill the service in a calendar month. 5. The patient may stop CCM services at any time (effective at the end of the month) by phone call to the office staff. 6. The patient will be responsible for cost sharing (co-pay) of up to 20% of the service fee (after annual deductible is met).  Patient did not agree to enrollment in care management services and does not wish to consider at this time.  Follow up plan: The patient has been provided with contact information for the chronic care management team and has been advised to call with any health related questions or concerns.   [place]  [email or web address]   ??[phone]

## 2019-03-17 ENCOUNTER — Ambulatory Visit: Payer: Medicare HMO | Admitting: Family Medicine

## 2019-03-19 ENCOUNTER — Other Ambulatory Visit: Payer: Self-pay | Admitting: Family Medicine

## 2019-03-30 ENCOUNTER — Other Ambulatory Visit: Payer: Self-pay

## 2019-03-31 ENCOUNTER — Ambulatory Visit (INDEPENDENT_AMBULATORY_CARE_PROVIDER_SITE_OTHER): Payer: Medicare HMO | Admitting: Family Medicine

## 2019-03-31 ENCOUNTER — Encounter: Payer: Self-pay | Admitting: Family Medicine

## 2019-03-31 VITALS — BP 111/62 | HR 62 | Temp 99.3°F | Ht 66.0 in | Wt 256.2 lb

## 2019-03-31 DIAGNOSIS — K219 Gastro-esophageal reflux disease without esophagitis: Secondary | ICD-10-CM

## 2019-03-31 DIAGNOSIS — I10 Essential (primary) hypertension: Secondary | ICD-10-CM

## 2019-03-31 DIAGNOSIS — E7800 Pure hypercholesterolemia, unspecified: Secondary | ICD-10-CM | POA: Diagnosis not present

## 2019-03-31 DIAGNOSIS — E538 Deficiency of other specified B group vitamins: Secondary | ICD-10-CM

## 2019-03-31 MED ORDER — OMEPRAZOLE 20 MG PO CPDR: 20.0000 mg | DELAYED_RELEASE_CAPSULE | Freq: Every day | ORAL | 3 refills | Status: DC

## 2019-03-31 MED ORDER — PRAVASTATIN SODIUM 40 MG PO TABS: 40.0000 mg | ORAL_TABLET | Freq: Every day | ORAL | 3 refills | Status: DC

## 2019-03-31 MED ORDER — BENAZEPRIL HCL 10 MG PO TABS: 10.0000 mg | ORAL_TABLET | Freq: Every day | ORAL | 3 refills | Status: DC

## 2019-03-31 MED ORDER — AMLODIPINE BESYLATE 5 MG PO TABS: 5.0000 mg | ORAL_TABLET | Freq: Every day | ORAL | 3 refills | Status: DC

## 2019-03-31 NOTE — Progress Notes (Signed)
BP 111/62   Pulse 62   Temp 99.3 F (37.4 C) (Temporal)   Ht _0  (1.676 m)   Wt 256 lb 3.2 oz (116.2 kg)   BMI 41.35 kg/m    Subjective:   Patient ID: [name] [name], male    DOB: [birth year], 73 y.o.   MRN: [id number]  HPI: [name] [name] is a 72 y.o. male presenting on 03/31/2019 for Establish Care ([place]- 4 month follow up)   HPI Hypertension Patient is currently on amlodipine and benazepril, and their blood pressure today is 111/62. Patient denies any lightheadedness or dizziness. Patient denies headaches, blurred vision, chest pains, shortness of breath, or weakness. Denies any side effects from medication and is content with current medication.   Hyperlipidemia Patient is coming in for recheck of his hyperlipidemia. The patient is currently taking pravastatin. They deny any issues with myalgias or history of liver damage from it. They deny any focal numbness or weakness or chest pain.   GERD Patient is currently on omeprazole.  She denies any major symptoms or abdominal pain or belching or burping. She denies any blood in her stool or lightheadedness or dizziness.   Is a history of B12 deficiency and will recheck for that today.  Patient denies any lightheadedness or dizziness or feelings of anemia or palpitations.  Relevant past medical, surgical, family and social history reviewed and updated as indicated. Interim medical history since our last visit reviewed. Allergies and medications reviewed and updated.  Review of Systems  Constitutional: Negative for chills and fever.  Respiratory: Negative for shortness of breath and wheezing.   Cardiovascular: Negative for chest pain and leg swelling.  Musculoskeletal: Negative for back pain and gait problem.  Skin: Negative for rash.  Neurological: Negative for dizziness, weakness and light-headedness.  All other systems reviewed and are negative.   Per HPI unless specifically indicated above   Allergies as of 03/31/2019       Reactions   Cialis [tadalafil]    Viagra [sildenafil Citrate] Other (See Comments)   heartburn      Medication List       Accurate as of March 31, 2019 11:49 AM. If you have any questions, ask your nurse or doctor.        amLODipine 5 MG tablet Commonly known as: NORVASC TAKE 1 TABLET EVERY DAY   aspirin 81 MG tablet Take 81 mg by mouth daily.   benazepril 10 MG tablet Commonly known as: LOTENSIN TAKE 1 TABLET EVERY DAY   Fish Oil 1000 MG Caps Take 3 capsules by mouth daily.   Garlic 1478 MG Caps Take 2 capsules by mouth daily.   lactulose 10 GM/15ML solution Commonly known as: CHRONULAC Take 15 mLs (10 g total) by mouth daily as needed for mild constipation.   NON FORMULARY Take 1 tablet by mouth daily. Equate allergy tablet   omeprazole 20 MG capsule Commonly known as: PRILOSEC TAKE 1 CAPSULE EVERY DAY   pravastatin 40 MG tablet Commonly known as: PRAVACHOL TAKE 1 TABLET EVERY DAY   vitamin C 500 MG tablet Commonly known as: ASCORBIC ACID Take 500 mg by mouth daily.   Vitamin D-3 125 MCG (5000 UT) Tabs Take 1 tablet by mouth daily.        Objective:   BP 111/62   Pulse 62   Temp 99.3 F (37.4 C) (Temporal)   Ht _1  (1.676 m)   Wt 256 lb 3.2 oz (116.2 kg)   BMI 41.35  kg/m   Wt Readings from Last 3 Encounters:  03/31/19 256 lb 3.2 oz (116.2 kg)  09/09/18 272 lb (123.4 kg)  04/07/18 261 lb (118.4 kg)    Physical Exam Vitals signs and nursing note reviewed.  Constitutional:      General: He is not in acute distress.    Appearance: He is well-developed. He is not diaphoretic.  Eyes:     General: No scleral icterus.    Conjunctiva/sclera: Conjunctivae normal.  Neck:     Musculoskeletal: Neck supple.     Thyroid: No thyromegaly.  Cardiovascular:     Rate and Rhythm: Normal rate and regular rhythm.     Heart sounds: Normal heart sounds. No murmur.  Pulmonary:     Effort: Pulmonary effort is normal. No respiratory distress.      Breath sounds: Normal breath sounds. No wheezing.  Musculoskeletal:        General: No swelling.  Lymphadenopathy:     Cervical: No cervical adenopathy.  Skin:    General: Skin is warm and dry.     Findings: No rash.  Neurological:     Mental Status: He is alert and oriented to person, place, and time.     Coordination: Coordination normal.  Psychiatric:        Behavior: Behavior normal.       Assessment & Plan:   Problem List Items Addressed This Visit      Cardiovascular and Mediastinum   Hypertension, essential - Primary   Relevant Medications   amLODipine (NORVASC) 5 MG tablet   benazepril (LOTENSIN) 10 MG tablet   pravastatin (PRAVACHOL) 40 MG tablet   Other Relevant Orders   CMP14+EGFR (Completed)     Digestive   GERD (gastroesophageal reflux disease)   Relevant Medications   omeprazole (PRILOSEC) 20 MG capsule   Other Relevant Orders   CBC with Differential/Platelet (Completed)     Other   Hyperlipidemia   Relevant Medications   amLODipine (NORVASC) 5 MG tablet   benazepril (LOTENSIN) 10 MG tablet   pravastatin (PRAVACHOL) 40 MG tablet   Other Relevant Orders   Lipid panel (Completed)   B12 deficiency      Continue current medication. Follow up plan: Return in about 6 months (around 09/28/2019), or if symptoms worsen or fail to improve, for htn.  Counseling provided for all of the vaccine components No orders of the defined types were placed in this encounter.   Caryl Pina, MD [facility] 03/31/2019, 11:49 AM

## 2019-04-01 LAB — CBC WITH DIFFERENTIAL/PLATELET
Basophils Absolute: 0.1 10*3/uL (ref 0.0–0.2)
Basos: 1 %
EOS (ABSOLUTE): 0.3 10*3/uL (ref 0.0–0.4)
Eos: 4 %
Hematocrit: 41.4 % (ref 37.5–51.0)
Hemoglobin: 14.3 g/dL (ref 13.0–17.7)
Immature Grans (Abs): 0 10*3/uL (ref 0.0–0.1)
Immature Granulocytes: 0 %
Lymphocytes Absolute: 2.6 10*3/uL (ref 0.7–3.1)
Lymphs: 34 %
MCH: 33.2 pg — ABNORMAL HIGH (ref 26.6–33.0)
MCHC: 34.5 g/dL (ref 31.5–35.7)
MCV: 96 fL (ref 79–97)
Monocytes Absolute: 0.5 10*3/uL (ref 0.1–0.9)
Monocytes: 7 %
Neutrophils Absolute: 4.2 10*3/uL (ref 1.4–7.0)
Neutrophils: 54 %
Platelets: 197 10*3/uL (ref 150–450)
RBC: 4.31 x10E6/uL (ref 4.14–5.80)
RDW: 12.9 % (ref 11.6–15.4)
WBC: 7.7 10*3/uL (ref 3.4–10.8)

## 2019-04-01 LAB — CMP14+EGFR
ALT: 16 IU/L (ref 0–44)
AST: 18 IU/L (ref 0–40)
Albumin/Globulin Ratio: 1.7 (ref 1.2–2.2)
Albumin: 3.9 g/dL (ref 3.7–4.7)
Alkaline Phosphatase: 52 IU/L (ref 39–117)
BUN/Creatinine Ratio: 8 — ABNORMAL LOW (ref 10–24)
BUN: 8 mg/dL (ref 8–27)
Bilirubin Total: 0.7 mg/dL (ref 0.0–1.2)
CO2: 26 mmol/L (ref 20–29)
Calcium: 9.4 mg/dL (ref 8.6–10.2)
Chloride: 101 mmol/L (ref 96–106)
Creatinine, Ser: 0.98 mg/dL (ref 0.76–1.27)
GFR calc Af Amer: 87 mL/min/{1.73_m2} (ref >59)
GFR calc non Af Amer: 76 mL/min/{1.73_m2} (ref >59)
Globulin, Total: 2.3 g/dL (ref 1.5–4.5)
Glucose: 97 mg/dL (ref 65–99)
Potassium: 4.5 mmol/L (ref 3.5–5.2)
Sodium: 140 mmol/L (ref 134–144)
Total Protein: 6.2 g/dL (ref 6.0–8.5)

## 2019-04-01 LAB — LIPID PANEL
Chol/HDL Ratio: 3.2 ratio (ref 0.0–5.0)
Cholesterol, Total: 126 mg/dL (ref 100–199)
HDL: 39 mg/dL — ABNORMAL LOW (ref >39)
LDL Chol Calc (NIH): 58 mg/dL (ref 0–99)
Triglycerides: 172 mg/dL — ABNORMAL HIGH (ref 0–149)
VLDL Cholesterol Cal: 29 mg/dL (ref 5–40)

## 2019-05-05 DIAGNOSIS — M1711 Unilateral primary osteoarthritis, right knee: Secondary | ICD-10-CM | POA: Insufficient documentation

## 2019-05-07 DIAGNOSIS — M170 Bilateral primary osteoarthritis of knee: Secondary | ICD-10-CM | POA: Diagnosis not present

## 2019-07-13 DIAGNOSIS — M25561 Pain in right knee: Secondary | ICD-10-CM | POA: Diagnosis not present

## 2019-07-13 DIAGNOSIS — M1711 Unilateral primary osteoarthritis, right knee: Secondary | ICD-10-CM | POA: Diagnosis not present

## 2019-07-22 DIAGNOSIS — M179 Osteoarthritis of knee, unspecified: Secondary | ICD-10-CM | POA: Diagnosis not present

## 2019-07-22 DIAGNOSIS — M170 Bilateral primary osteoarthritis of knee: Secondary | ICD-10-CM | POA: Diagnosis not present

## 2019-07-29 DIAGNOSIS — M179 Osteoarthritis of knee, unspecified: Secondary | ICD-10-CM | POA: Diagnosis not present

## 2019-07-29 DIAGNOSIS — M170 Bilateral primary osteoarthritis of knee: Secondary | ICD-10-CM | POA: Diagnosis not present

## 2019-09-29 ENCOUNTER — Ambulatory Visit (INDEPENDENT_AMBULATORY_CARE_PROVIDER_SITE_OTHER): Payer: Medicare HMO | Admitting: Family Medicine

## 2019-09-29 NOTE — Progress Notes (Signed)
Patient came in but provider was remote. He will reschedule Caryl Pina, MD [place] 09/29/2019, 8:04 AM

## 2019-10-07 ENCOUNTER — Telehealth: Payer: Self-pay | Admitting: Family Medicine

## 2019-10-07 NOTE — Chronic Care Management (AMB) (Signed)
  Chronic Care Management   Outreach Note  10/07/2019 Name: [name] [name] MRN: [id number] DOB: [birth year]  [name] is a 74 y.o. year old male who is a primary care patient of Dettinger, Fransisca Kaufmann, MD. I reached out to [name] [name] by phone today in response to a referral sent by [name] [name]'s health plan.     An unsuccessful telephone outreach was attempted today. The patient was referred to the case management team for assistance with care management and care coordination.   Follow Up Plan: A HIPPA compliant phone message was left for the patient providing contact information and requesting a return call.  The care management team will reach out to the patient again over the next 7 days.  If patient returns call to provider office, please advise to call [place] at [phone].  [place], [place] [address] Direct Dial: [phone] [name]: [email or web address]

## 2019-10-11 ENCOUNTER — Ambulatory Visit (INDEPENDENT_AMBULATORY_CARE_PROVIDER_SITE_OTHER): Payer: Medicare HMO | Admitting: Family Medicine

## 2019-10-11 ENCOUNTER — Other Ambulatory Visit: Payer: Self-pay

## 2019-10-11 ENCOUNTER — Encounter: Payer: Self-pay | Admitting: Family Medicine

## 2019-10-11 VITALS — BP 127/70 | HR 50 | Temp 99.8°F | Ht 66.0 in | Wt 262.0 lb

## 2019-10-11 DIAGNOSIS — I10 Essential (primary) hypertension: Secondary | ICD-10-CM

## 2019-10-11 DIAGNOSIS — Z1159 Encounter for screening for other viral diseases: Secondary | ICD-10-CM

## 2019-10-11 DIAGNOSIS — K219 Gastro-esophageal reflux disease without esophagitis: Secondary | ICD-10-CM | POA: Diagnosis not present

## 2019-10-11 DIAGNOSIS — E782 Mixed hyperlipidemia: Secondary | ICD-10-CM | POA: Diagnosis not present

## 2019-10-11 DIAGNOSIS — Z1211 Encounter for screening for malignant neoplasm of colon: Secondary | ICD-10-CM

## 2019-10-11 DIAGNOSIS — N400 Enlarged prostate without lower urinary tract symptoms: Secondary | ICD-10-CM | POA: Diagnosis not present

## 2019-10-11 NOTE — Chronic Care Management (AMB) (Signed)
  Chronic Care Management   Note  10/11/2019 Name: [name] [name] MRN: [id number] DOB: [birth year]  [name] [name] is a 74 y.o. year old male who is a primary care patient of Dettinger, Fransisca Kaufmann, MD. I reached out to [name] by phone today in response to a referral sent by [name]'s health plan.     [name] was given information about Chronic Care Management services today including:  1. CCM service includes personalized support from designated clinical staff supervised by his physician, including individualized plan of care and coordination with other care providers 2. 24/7 contact phone numbers for assistance for urgent and routine care needs. 3. Service will only be billed when office clinical staff spend 20 minutes or more in a month to coordinate care. 4. Only one practitioner may furnish and bill the service in a calendar month. 5. The patient may stop CCM services at any time (effective at the end of the month) by phone call to the office staff. 6. The patient will be responsible for cost sharing (co-pay) of up to 20% of the service fee (after annual deductible is met).  Patient did not agree to enrollment in care management services and does not wish to consider at this time.  Follow up plan: The patient has been provided with contact information for the care management team and has been advised to call with any health related questions or concerns.   [place], [place] [address] Direct Dial: [phone] [email or web address]: [email or web address]

## 2019-10-11 NOTE — Progress Notes (Signed)
BP 127/70   Pulse (!) 50   Temp 99.8 F (37.7 C)   Ht '5\' 6"'  (1.676 m)   Wt 262 lb (118.8 kg)   SpO2 97%   BMI 42.29 kg/m    Subjective:   Patient ID: [name] [name], male    DOB: 28-Jul-1946, 74 y.o.   MRN: [id number]  HPI: [name] [name] is a 74 y.o. male presenting on 10/11/2019 for Medical Management of Chronic Issues (87mfollow up) and Hypertension   HPI Hypertension Patient is currently on amlodipine and benazepril, and their blood pressure today is 127/70. Patient denies any lightheadedness or dizziness. Patient denies headaches, blurred vision, chest pains, shortness of breath, or weakness. Denies any side effects from medication and is content with current medication.   Hyperlipidemia Patient is coming in for recheck of his hyperlipidemia. The patient is currently taking pravastatin. They deny any issues with myalgias or history of liver damage from it. They deny any focal numbness or weakness or chest pain.   BPH Patient is coming in for recheck on BPH Symptoms: No symptoms currently Medication: None Last PSA: 1 year ago was 2.0  GERD Patient is currently on omeprazole.  She denies any major symptoms or abdominal pain or belching or burping. She denies any blood in her stool or lightheadedness or dizziness.   Relevant past medical, surgical, family and social history reviewed and updated as indicated. Interim medical history since our last visit reviewed. Allergies and medications reviewed and updated.  Review of Systems  Constitutional: Negative for chills and fever.  Respiratory: Negative for cough, shortness of breath and wheezing.   Cardiovascular: Negative for chest pain, palpitations and leg swelling.  Gastrointestinal: Negative for abdominal pain, blood in stool, constipation and diarrhea.  Genitourinary: Negative for decreased urine volume, difficulty urinating, dysuria and hematuria.  Musculoskeletal: Positive for arthralgias (Patient has arthritis in  both knees but sees orthopedic for this). Negative for back pain, gait problem and myalgias.  Skin: Negative for rash.  Neurological: Negative for dizziness, weakness and headaches.  Psychiatric/Behavioral: Negative for suicidal ideas.  All other systems reviewed and are negative.   Per HPI unless specifically indicated above   Allergies as of 10/11/2019      Reactions   Cialis [tadalafil]    Viagra [sildenafil Citrate] Other (See Comments)   heartburn      Medication List       Accurate as of October 11, 2019 11:15 AM. If you have any questions, ask your nurse or doctor.        amLODipine 5 MG tablet Commonly known as: NORVASC Take 1 tablet (5 mg total) by mouth daily.   aspirin 81 MG tablet Take 81 mg by mouth daily.   benazepril 10 MG tablet Commonly known as: LOTENSIN Take 1 tablet (10 mg total) by mouth daily.   Fish Oil 1000 MG Caps Take 3 capsules by mouth daily.   Garlic 11448MG Caps Take 2 capsules by mouth daily.   lactulose 10 GM/15ML solution Commonly known as: CHRONULAC Take 15 mLs (10 g total) by mouth daily as needed for mild constipation.   NON FORMULARY Take 1 tablet by mouth daily. Equate allergy tablet   omeprazole 20 MG capsule Commonly known as: PRILOSEC Take 1 capsule (20 mg total) by mouth daily.   pravastatin 40 MG tablet Commonly known as: PRAVACHOL Take 1 tablet (40 mg total) by mouth daily.   vitamin C 500 MG tablet Commonly known as: ASCORBIC ACID  Take 500 mg by mouth daily.   Vitamin D-3 125 MCG (5000 UT) Tabs Take 1 tablet by mouth daily.        Objective:   BP 127/70   Pulse (!) 50   Temp 99.8 F (37.7 C)   Ht '5\' 6"'  (1.676 m)   Wt 262 lb (118.8 kg)   SpO2 97%   BMI 42.29 kg/m   Wt Readings from Last 3 Encounters:  10/11/19 262 lb (118.8 kg)  03/31/19 256 lb 3.2 oz (116.2 kg)  09/09/18 272 lb (123.4 kg)    Physical Exam Vitals and nursing note reviewed.  Constitutional:      General: He is not in acute  distress.    Appearance: He is well-developed. He is not diaphoretic.  Eyes:     General: No scleral icterus.       Right eye: No discharge.     Conjunctiva/sclera: Conjunctivae normal.     Pupils: Pupils are equal, round, and reactive to light.  Neck:     Thyroid: No thyromegaly.  Cardiovascular:     Rate and Rhythm: Normal rate and regular rhythm.     Heart sounds: Normal heart sounds. No murmur.  Pulmonary:     Effort: Pulmonary effort is normal. No respiratory distress.     Breath sounds: Normal breath sounds. No wheezing.  Musculoskeletal:        General: Normal range of motion.     Cervical back: Neck supple.  Skin:    General: Skin is warm and dry.     Findings: No rash.  Neurological:     Mental Status: He is alert and oriented to person, place, and time.     Coordination: Coordination normal.  Psychiatric:        Behavior: Behavior normal.       Assessment & Plan:   Problem List Items Addressed This Visit      Cardiovascular and Mediastinum   Hypertension, essential - Primary   Relevant Orders   CBC with Differential/Platelet   CMP14+EGFR     Digestive   GERD (gastroesophageal reflux disease)   Relevant Orders   CBC with Differential/Platelet     Genitourinary   BPH (benign prostatic hyperplasia)   Relevant Orders   PSA, total and free     Other   Hyperlipidemia   Relevant Orders   Lipid panel    Other Visit Diagnoses    Need for hepatitis C screening test       Relevant Orders   Hepatitis C antibody   Colon cancer screening       Relevant Orders   Fecal occult blood, imunochemical      Continue current medication, no changes, will check blood work. Follow up plan: Return in about 6 months (around 04/12/2020), or if symptoms worsen or fail to improve, for Hyperlipidemia and hypertension.  Counseling provided for all of the vaccine components Orders Placed This Encounter  Procedures  . Fecal occult blood, imunochemical  . Hepatitis C  antibody  . Lipid panel  . CBC with Differential/Platelet  . [place], MD [facility] 10/11/2019, 11:15 AM

## 2019-10-12 LAB — LIPID PANEL
Chol/HDL Ratio: 3.2 ratio (ref 0.0–5.0)
Cholesterol, Total: 154 mg/dL (ref 100–199)
HDL: 48 mg/dL (ref >39)
LDL Chol Calc (NIH): 80 mg/dL (ref 0–99)
Triglycerides: 149 mg/dL (ref 0–149)
VLDL Cholesterol Cal: 26 mg/dL (ref 5–40)

## 2019-10-12 LAB — CMP14+EGFR
ALT: 25 IU/L (ref 0–44)
AST: 36 IU/L (ref 0–40)
Albumin/Globulin Ratio: 1.8 (ref 1.2–2.2)
Albumin: 4 g/dL (ref 3.7–4.7)
Alkaline Phosphatase: 58 IU/L (ref 39–117)
BUN/Creatinine Ratio: 11 (ref 10–24)
BUN: 9 mg/dL (ref 8–27)
Bilirubin Total: 0.6 mg/dL (ref 0.0–1.2)
CO2: 28 mmol/L (ref 20–29)
Calcium: 9.3 mg/dL (ref 8.6–10.2)
Chloride: 101 mmol/L (ref 96–106)
Creatinine, Ser: 0.83 mg/dL (ref 0.76–1.27)
GFR calc Af Amer: 100 mL/min/{1.73_m2} (ref >59)
GFR calc non Af Amer: 86 mL/min/{1.73_m2} (ref >59)
Globulin, Total: 2.2 g/dL (ref 1.5–4.5)
Glucose: 115 mg/dL — ABNORMAL HIGH (ref 65–99)
Potassium: 4.5 mmol/L (ref 3.5–5.2)
Sodium: 141 mmol/L (ref 134–144)
Total Protein: 6.2 g/dL (ref 6.0–8.5)

## 2019-10-12 LAB — CBC WITH DIFFERENTIAL/PLATELET
Basophils Absolute: 0.1 10*3/uL (ref 0.0–0.2)
Basos: 1 %
EOS (ABSOLUTE): 0.2 10*3/uL (ref 0.0–0.4)
Eos: 3 %
Hematocrit: 41.9 % (ref 37.5–51.0)
Hemoglobin: 14.7 g/dL (ref 13.0–17.7)
Immature Grans (Abs): 0 10*3/uL (ref 0.0–0.1)
Immature Granulocytes: 0 %
Lymphocytes Absolute: 2.2 10*3/uL (ref 0.7–3.1)
Lymphs: 28 %
MCH: 33.8 pg — ABNORMAL HIGH (ref 26.6–33.0)
MCHC: 35.1 g/dL (ref 31.5–35.7)
MCV: 96 fL (ref 79–97)
Monocytes Absolute: 0.5 10*3/uL (ref 0.1–0.9)
Monocytes: 7 %
Neutrophils Absolute: 4.9 10*3/uL (ref 1.4–7.0)
Neutrophils: 61 %
Platelets: 192 10*3/uL (ref 150–450)
RBC: 4.35 x10E6/uL (ref 4.14–5.80)
RDW: 12.2 % (ref 11.6–15.4)
WBC: 8 10*3/uL (ref 3.4–10.8)

## 2019-10-12 LAB — PSA, TOTAL AND FREE
PSA, Free Pct: 15.3 %
PSA, Free: 0.52 ng/mL
Prostate Specific Ag, Serum: 3.4 ng/mL (ref 0.0–4.0)

## 2019-10-12 LAB — HEPATITIS C ANTIBODY: Hep C Virus Ab: <0.1 s/co ratio (ref 0.0–0.9)

## 2020-03-30 DIAGNOSIS — M170 Bilateral primary osteoarthritis of knee: Secondary | ICD-10-CM | POA: Diagnosis not present

## 2020-03-30 DIAGNOSIS — M179 Osteoarthritis of knee, unspecified: Secondary | ICD-10-CM | POA: Diagnosis not present

## 2020-04-12 ENCOUNTER — Ambulatory Visit (INDEPENDENT_AMBULATORY_CARE_PROVIDER_SITE_OTHER): Payer: Medicare HMO | Admitting: Family Medicine

## 2020-04-12 ENCOUNTER — Other Ambulatory Visit: Payer: Self-pay

## 2020-04-12 ENCOUNTER — Encounter: Payer: Self-pay | Admitting: Family Medicine

## 2020-04-12 VITALS — BP 137/74 | HR 63 | Temp 98.0°F | Ht 66.0 in | Wt 248.0 lb

## 2020-04-12 DIAGNOSIS — E559 Vitamin D deficiency, unspecified: Secondary | ICD-10-CM | POA: Diagnosis not present

## 2020-04-12 DIAGNOSIS — E538 Deficiency of other specified B group vitamins: Secondary | ICD-10-CM | POA: Diagnosis not present

## 2020-04-12 DIAGNOSIS — K219 Gastro-esophageal reflux disease without esophagitis: Secondary | ICD-10-CM

## 2020-04-12 DIAGNOSIS — E782 Mixed hyperlipidemia: Secondary | ICD-10-CM | POA: Diagnosis not present

## 2020-04-12 DIAGNOSIS — I10 Essential (primary) hypertension: Secondary | ICD-10-CM

## 2020-04-12 DIAGNOSIS — E7800 Pure hypercholesterolemia, unspecified: Secondary | ICD-10-CM | POA: Diagnosis not present

## 2020-04-12 MED ORDER — OMEPRAZOLE 20 MG PO CPDR: 20.0000 mg | DELAYED_RELEASE_CAPSULE | Freq: Every day | ORAL | 3 refills | Status: DC

## 2020-04-12 MED ORDER — LACTULOSE 10 GM/15ML PO SOLN: 10.0000 g | Freq: Every day | ORAL | 3 refills | Status: DC | PRN

## 2020-04-12 MED ORDER — PRAVASTATIN SODIUM 40 MG PO TABS: 40.0000 mg | ORAL_TABLET | Freq: Every day | ORAL | 3 refills | Status: DC

## 2020-04-12 MED ORDER — AMLODIPINE BESYLATE 5 MG PO TABS: 5.0000 mg | ORAL_TABLET | Freq: Every day | ORAL | 3 refills | Status: DC

## 2020-04-12 MED ORDER — BENAZEPRIL HCL 10 MG PO TABS: 10.0000 mg | ORAL_TABLET | Freq: Every day | ORAL | 3 refills | Status: DC

## 2020-04-12 NOTE — Progress Notes (Signed)
BP 137/74   Pulse 63   Temp 98 F (36.7 C)   Ht '5\' 6"'  (1.676 m)   Wt 248 lb (112.5 kg)   SpO2 95%   BMI 40.03 kg/m    Subjective:   Patient ID: [name] [name], male    DOB: 06-06-1946, 74 y.o.   MRN: [id number]  HPI: [name] [name] is a 74 y.o. male presenting on 04/12/2020 for Medical Management of Chronic Issues and Hypertension   HPI Hypertension Patient is currently on amlodipine and benazepril, and their blood pressure today is 137/74. Patient denies any lightheadedness or dizziness. Patient denies headaches, blurred vision, chest pains, shortness of breath, or weakness. Denies any side effects from medication and is content with current medication.   Hyperlipidemia Patient is coming in for recheck of his hyperlipidemia. The patient is currently taking pravastatin. They deny any issues with myalgias or history of liver damage from it. They deny any focal numbness or weakness or chest pain.   GERD Patient is currently on omeprazole.  She denies any major symptoms or abdominal pain or belching or burping. She denies any blood in her stool or lightheadedness or dizziness. r  Patient is coming in for B12 deficiency and by the deficiency recheck.  Relevant past medical, surgical, family and social history reviewed and updated as indicated. Interim medical history since our last visit reviewed. Allergies and medications reviewed and updated.  Review of Systems  Constitutional: Negative for chills and fever.  Eyes: Negative for visual disturbance.  Respiratory: Negative for shortness of breath and wheezing.   Cardiovascular: Negative for chest pain and leg swelling.  Musculoskeletal: Negative for back pain and gait problem.  Skin: Negative for rash.  Neurological: Negative for dizziness, weakness and light-headedness.  All other systems reviewed and are negative.   Per HPI unless specifically indicated above   Allergies as of 04/12/2020      Reactions   Cialis  [tadalafil]    Viagra [sildenafil Citrate] Other (See Comments)   heartburn      Medication List       Accurate as of April 12, 2020  8:26 AM. If you have any questions, ask your nurse or doctor.        amLODipine 5 MG tablet Commonly known as: NORVASC Take 1 tablet (5 mg total) by mouth daily.   aspirin 81 MG tablet Take 81 mg by mouth daily.   benazepril 10 MG tablet Commonly known as: LOTENSIN Take 1 tablet (10 mg total) by mouth daily.   Fish Oil 1000 MG Caps Take 3 capsules by mouth daily.   Garlic 2035 MG Caps Take 2 capsules by mouth daily.   lactulose 10 GM/15ML solution Commonly known as: CHRONULAC Take 15 mLs (10 g total) by mouth daily as needed for mild constipation.   NON FORMULARY Take 1 tablet by mouth daily. Equate allergy tablet   omeprazole 20 MG capsule Commonly known as: PRILOSEC Take 1 capsule (20 mg total) by mouth daily.   pravastatin 40 MG tablet Commonly known as: PRAVACHOL Take 1 tablet (40 mg total) by mouth daily.   vitamin C 500 MG tablet Commonly known as: ASCORBIC ACID Take 500 mg by mouth daily.   Vitamin D-3 125 MCG (5000 UT) Tabs Take 1 tablet by mouth daily.        Objective:   BP 137/74   Pulse 63   Temp 98 F (36.7 C)   Ht '5\' 6"'  (1.676 m)   Wt  248 lb (112.5 kg)   SpO2 95%   BMI 40.03 kg/m   Wt Readings from Last 3 Encounters:  04/12/20 248 lb (112.5 kg)  10/11/19 262 lb (118.8 kg)  03/31/19 256 lb 3.2 oz (116.2 kg)    Physical Exam Vitals and nursing note reviewed.  Constitutional:      General: He is not in acute distress.    Appearance: He is well-developed. He is not diaphoretic.  Eyes:     General: No scleral icterus.    Conjunctiva/sclera: Conjunctivae normal.  Neck:     Thyroid: No thyromegaly.  Cardiovascular:     Rate and Rhythm: Normal rate and regular rhythm.     Heart sounds: Normal heart sounds. No murmur heard.   Pulmonary:     Effort: Pulmonary effort is normal. No respiratory  distress.     Breath sounds: Normal breath sounds. No wheezing.  Musculoskeletal:        General: Normal range of motion.     Cervical back: Neck supple.  Lymphadenopathy:     Cervical: No cervical adenopathy.  Skin:    General: Skin is warm and dry.     Findings: No rash.  Neurological:     Mental Status: He is alert and oriented to person, place, and time.     Coordination: Coordination normal.  Psychiatric:        Behavior: Behavior normal.     Assessment & Plan:   Problem List Items Addressed This Visit      Cardiovascular and Mediastinum   Hypertension, essential - Primary   Relevant Medications   amLODipine (NORVASC) 5 MG tablet   benazepril (LOTENSIN) 10 MG tablet   pravastatin (PRAVACHOL) 40 MG tablet   Other Relevant Orders   CMP14+EGFR     Digestive   GERD (gastroesophageal reflux disease)   Relevant Medications   lactulose (CHRONULAC) 10 GM/15ML solution   omeprazole (PRILOSEC) 20 MG capsule   Other Relevant Orders   CBC with Differential/Platelet     Other   Vitamin D deficiency   Relevant Orders   VITAMIN D 25 Hydroxy (Vit-D Deficiency, Fractures)   Hyperlipidemia   Relevant Medications   amLODipine (NORVASC) 5 MG tablet   benazepril (LOTENSIN) 10 MG tablet   pravastatin (PRAVACHOL) 40 MG tablet   Other Relevant Orders   CMP14+EGFR   Lipid panel   B12 deficiency   Relevant Orders   Vitamin B12      Continue current medication.  No changes. Follow up plan: Return in about 6 months (around 10/10/2020), or if symptoms worsen or fail to improve, for Hypertension and cholesterol recheck.  Counseling provided for all of the vaccine components Orders Placed This Encounter  Procedures  . CBC with Differential/Platelet  . CMP14+EGFR  . Lipid panel  . Vitamin B12  . VITAMIN D 25 Hydroxy (Vit-D Deficiency, Fractures)    [name] Pina, MD [facility] 04/12/2020, 8:26 AM

## 2020-04-13 LAB — VITAMIN D 25 HYDROXY (VIT D DEFICIENCY, FRACTURES): Vit D, 25-Hydroxy: 58 ng/mL (ref 30.0–100.0)

## 2020-04-13 LAB — CMP14+EGFR
ALT: 31 IU/L (ref 0–44)
AST: 30 IU/L (ref 0–40)
Albumin/Globulin Ratio: 1.8 (ref 1.2–2.2)
Albumin: 4.4 g/dL (ref 3.7–4.7)
Alkaline Phosphatase: 57 IU/L (ref 44–121)
BUN/Creatinine Ratio: 24 (ref 10–24)
BUN: 22 mg/dL (ref 8–27)
Bilirubin Total: 0.7 mg/dL (ref 0.0–1.2)
CO2: 29 mmol/L (ref 20–29)
Calcium: 9.9 mg/dL (ref 8.6–10.2)
Chloride: 101 mmol/L (ref 96–106)
Creatinine, Ser: 0.91 mg/dL (ref 0.76–1.27)
GFR calc Af Amer: 95 mL/min/{1.73_m2} (ref >59)
GFR calc non Af Amer: 82 mL/min/{1.73_m2} (ref >59)
Globulin, Total: 2.4 g/dL (ref 1.5–4.5)
Glucose: 117 mg/dL — ABNORMAL HIGH (ref 65–99)
Potassium: 5.1 mmol/L (ref 3.5–5.2)
Sodium: 142 mmol/L (ref 134–144)
Total Protein: 6.8 g/dL (ref 6.0–8.5)

## 2020-04-13 LAB — LIPID PANEL
Chol/HDL Ratio: 3.2 ratio (ref 0.0–5.0)
Cholesterol, Total: 187 mg/dL (ref 100–199)
HDL: 59 mg/dL (ref >39)
LDL Chol Calc (NIH): 111 mg/dL — ABNORMAL HIGH (ref 0–99)
Triglycerides: 91 mg/dL (ref 0–149)
VLDL Cholesterol Cal: 17 mg/dL (ref 5–40)

## 2020-04-13 LAB — CBC WITH DIFFERENTIAL/PLATELET
Basophils Absolute: 0 10*3/uL (ref 0.0–0.2)
Basos: 0 %
EOS (ABSOLUTE): 0.1 10*3/uL (ref 0.0–0.4)
Eos: 1 %
Hematocrit: 43.3 % (ref 37.5–51.0)
Hemoglobin: 14.7 g/dL (ref 13.0–17.7)
Immature Grans (Abs): 0 10*3/uL (ref 0.0–0.1)
Immature Granulocytes: 0 %
Lymphocytes Absolute: 2.6 10*3/uL (ref 0.7–3.1)
Lymphs: 28 %
MCH: 33.4 pg — ABNORMAL HIGH (ref 26.6–33.0)
MCHC: 33.9 g/dL (ref 31.5–35.7)
MCV: 98 fL — ABNORMAL HIGH (ref 79–97)
Monocytes Absolute: 0.5 10*3/uL (ref 0.1–0.9)
Monocytes: 6 %
Neutrophils Absolute: 5.9 10*3/uL (ref 1.4–7.0)
Neutrophils: 65 %
Platelets: 192 10*3/uL (ref 150–450)
RBC: 4.4 x10E6/uL (ref 4.14–5.80)
RDW: 11.6 % (ref 11.6–15.4)
WBC: 9.2 10*3/uL (ref 3.4–10.8)

## 2020-04-13 LAB — VITAMIN B12: Vitamin B-12: 208 pg/mL — ABNORMAL LOW (ref 232–1245)

## 2020-04-24 ENCOUNTER — Telehealth: Payer: Self-pay | Admitting: Family Medicine

## 2020-04-24 NOTE — Telephone Encounter (Signed)
Pt returned missed call from [facility] regarding lab results. Reviewed results with pt per Dr Neldon Mc note. Pt voiced understanding.

## 2020-05-08 ENCOUNTER — Telehealth: Payer: Self-pay

## 2020-05-14 DIAGNOSIS — U071 COVID-19: Secondary | ICD-10-CM | POA: Diagnosis not present

## 2020-05-14 DIAGNOSIS — J189 Pneumonia, unspecified organism: Secondary | ICD-10-CM | POA: Diagnosis not present

## 2020-05-29 DIAGNOSIS — R0602 Shortness of breath: Secondary | ICD-10-CM | POA: Diagnosis not present

## 2020-05-29 DIAGNOSIS — R5383 Other fatigue: Secondary | ICD-10-CM | POA: Diagnosis not present

## 2020-05-31 ENCOUNTER — Encounter (HOSPITAL_COMMUNITY): Payer: Self-pay | Admitting: Emergency Medicine

## 2020-05-31 ENCOUNTER — Other Ambulatory Visit: Payer: Self-pay

## 2020-05-31 ENCOUNTER — Emergency Department (HOSPITAL_COMMUNITY): Payer: Medicare HMO

## 2020-05-31 ENCOUNTER — Inpatient Hospital Stay (HOSPITAL_COMMUNITY): Payer: Medicare HMO

## 2020-05-31 ENCOUNTER — Inpatient Hospital Stay (HOSPITAL_COMMUNITY)
Admission: EM | Admit: 2020-05-31 | Discharge: 2020-06-02 | DRG: 175 | Disposition: A | Discharge status: Home / Self Care | Payer: Medicare HMO | Attending: Internal Medicine | Admitting: Internal Medicine

## 2020-05-31 DIAGNOSIS — Z86711 Personal history of pulmonary embolism: Secondary | ICD-10-CM | POA: Diagnosis present

## 2020-05-31 DIAGNOSIS — Z8719 Personal history of other diseases of the digestive system: Secondary | ICD-10-CM | POA: Diagnosis not present

## 2020-05-31 DIAGNOSIS — Z823 Family history of stroke: Secondary | ICD-10-CM

## 2020-05-31 DIAGNOSIS — Z20822 Contact with and (suspected) exposure to covid-19: Secondary | ICD-10-CM | POA: Diagnosis present

## 2020-05-31 DIAGNOSIS — Z8616 Personal history of COVID-19: Secondary | ICD-10-CM | POA: Diagnosis not present

## 2020-05-31 DIAGNOSIS — R0902 Hypoxemia: Secondary | ICD-10-CM | POA: Diagnosis present

## 2020-05-31 DIAGNOSIS — R54 Age-related physical debility: Secondary | ICD-10-CM | POA: Diagnosis present

## 2020-05-31 DIAGNOSIS — I2699 Other pulmonary embolism without acute cor pulmonale: Secondary | ICD-10-CM | POA: Diagnosis not present

## 2020-05-31 DIAGNOSIS — I10 Essential (primary) hypertension: Secondary | ICD-10-CM | POA: Diagnosis not present

## 2020-05-31 DIAGNOSIS — U099 Post covid-19 condition, unspecified: Secondary | ICD-10-CM | POA: Diagnosis present

## 2020-05-31 DIAGNOSIS — Z6838 Body mass index (BMI) 38.0-38.9, adult: Secondary | ICD-10-CM | POA: Diagnosis not present

## 2020-05-31 DIAGNOSIS — Z888 Allergy status to other drugs, medicaments and biological substances status: Secondary | ICD-10-CM

## 2020-05-31 DIAGNOSIS — R079 Chest pain, unspecified: Secondary | ICD-10-CM | POA: Diagnosis not present

## 2020-05-31 DIAGNOSIS — K219 Gastro-esophageal reflux disease without esophagitis: Secondary | ICD-10-CM | POA: Diagnosis present

## 2020-05-31 DIAGNOSIS — H9190 Unspecified hearing loss, unspecified ear: Secondary | ICD-10-CM | POA: Diagnosis present

## 2020-05-31 DIAGNOSIS — I82433 Acute embolism and thrombosis of popliteal vein, bilateral: Secondary | ICD-10-CM | POA: Diagnosis present

## 2020-05-31 DIAGNOSIS — I82443 Acute embolism and thrombosis of tibial vein, bilateral: Secondary | ICD-10-CM | POA: Diagnosis present

## 2020-05-31 DIAGNOSIS — E785 Hyperlipidemia, unspecified: Secondary | ICD-10-CM | POA: Diagnosis present

## 2020-05-31 DIAGNOSIS — E1159 Type 2 diabetes mellitus with other circulatory complications: Secondary | ICD-10-CM | POA: Diagnosis present

## 2020-05-31 DIAGNOSIS — I2601 Septic pulmonary embolism with acute cor pulmonale: Secondary | ICD-10-CM | POA: Diagnosis not present

## 2020-05-31 DIAGNOSIS — I2510 Atherosclerotic heart disease of native coronary artery without angina pectoris: Secondary | ICD-10-CM | POA: Diagnosis present

## 2020-05-31 DIAGNOSIS — N400 Enlarged prostate without lower urinary tract symptoms: Secondary | ICD-10-CM | POA: Diagnosis present

## 2020-05-31 DIAGNOSIS — F17290 Nicotine dependence, other tobacco product, uncomplicated: Secondary | ICD-10-CM | POA: Diagnosis present

## 2020-05-31 DIAGNOSIS — I2609 Other pulmonary embolism with acute cor pulmonale: Secondary | ICD-10-CM | POA: Diagnosis not present

## 2020-05-31 DIAGNOSIS — Z79899 Other long term (current) drug therapy: Secondary | ICD-10-CM | POA: Diagnosis not present

## 2020-05-31 DIAGNOSIS — Z8249 Family history of ischemic heart disease and other diseases of the circulatory system: Secondary | ICD-10-CM | POA: Diagnosis not present

## 2020-05-31 DIAGNOSIS — I2692 Saddle embolus of pulmonary artery without acute cor pulmonale: Secondary | ICD-10-CM | POA: Diagnosis not present

## 2020-05-31 DIAGNOSIS — Z833 Family history of diabetes mellitus: Secondary | ICD-10-CM | POA: Diagnosis not present

## 2020-05-31 DIAGNOSIS — R0602 Shortness of breath: Secondary | ICD-10-CM

## 2020-05-31 DIAGNOSIS — I82453 Acute embolism and thrombosis of peroneal vein, bilateral: Secondary | ICD-10-CM | POA: Diagnosis present

## 2020-05-31 DIAGNOSIS — Z66 Do not resuscitate: Secondary | ICD-10-CM | POA: Diagnosis not present

## 2020-05-31 DIAGNOSIS — R778 Other specified abnormalities of plasma proteins: Secondary | ICD-10-CM | POA: Diagnosis present

## 2020-05-31 LAB — CBC
HCT: 40.8 % (ref 39.0–52.0)
Hemoglobin: 14.1 g/dL (ref 13.0–17.0)
MCH: 34.1 pg — ABNORMAL HIGH (ref 26.0–34.0)
MCHC: 34.6 g/dL (ref 30.0–36.0)
MCV: 98.8 fL (ref 80.0–100.0)
Platelets: 177 10*3/uL (ref 150–400)
RBC: 4.13 MIL/uL — ABNORMAL LOW (ref 4.22–5.81)
RDW: 12.7 % (ref 11.5–15.5)
WBC: 7 10*3/uL (ref 4.0–10.5)
nRBC: 0 % (ref 0.0–0.2)

## 2020-05-31 LAB — BASIC METABOLIC PANEL
Anion gap: 13 (ref 5–15)
BUN: 6 mg/dL — ABNORMAL LOW (ref 8–23)
CO2: 25 mmol/L (ref 22–32)
Calcium: 9 mg/dL (ref 8.9–10.3)
Chloride: 99 mmol/L (ref 98–111)
Creatinine, Ser: 0.92 mg/dL (ref 0.61–1.24)
GFR, Estimated: >60 mL/min (ref >60)
Glucose, Bld: 134 mg/dL — ABNORMAL HIGH (ref 70–99)
Potassium: 3.7 mmol/L (ref 3.5–5.1)
Sodium: 137 mmol/L (ref 135–145)

## 2020-05-31 LAB — ECHOCARDIOGRAM COMPLETE
AR max vel: 3.03 cm2
AV Area VTI: 2.87 cm2
AV Area mean vel: 2.79 cm2
AV Mean grad: 3 mmHg
AV Peak grad: 4.8 mmHg
Ao pk vel: 1.09 m/s
Area-P 1/2: 2.2 cm2
Height: 66 in
S' Lateral: 2.5 cm
Weight: 3968 oz

## 2020-05-31 LAB — BRAIN NATRIURETIC PEPTIDE: B Natriuretic Peptide: 82.6 pg/mL (ref 0.0–100.0)

## 2020-05-31 LAB — TROPONIN I (HIGH SENSITIVITY)
Troponin I (High Sensitivity): 38 ng/L — ABNORMAL HIGH (ref <18)
Troponin I (High Sensitivity): 36 ng/L — ABNORMAL HIGH (ref <18)

## 2020-05-31 LAB — HEPARIN LEVEL (UNFRACTIONATED): Heparin Unfractionated: 0.92 IU/mL — ABNORMAL HIGH (ref 0.30–0.70)

## 2020-05-31 LAB — D-DIMER, QUANTITATIVE: D-Dimer, Quant: 18.87 ug/mL-FEU — ABNORMAL HIGH (ref 0.00–0.50)

## 2020-05-31 MED ORDER — PANTOPRAZOLE SODIUM 40 MG PO TBEC
40.0000 mg | DELAYED_RELEASE_TABLET | Freq: Every day | ORAL | Status: DC
  Administered 2020-05-31 – 2020-06-02 (×3): 40 mg via ORAL
  Filled 2020-05-31 (×3): qty 1

## 2020-05-31 MED ORDER — IOHEXOL 350 MG/ML SOLN
80.0000 mL | Freq: Once | INTRAVENOUS | Status: AC | PRN
  Administered 2020-05-31: 80 mL via INTRAVENOUS

## 2020-05-31 MED ORDER — VITAMIN D-3 125 MCG (5000 UT) PO TABS: 5000.0000 [IU] | ORAL_TABLET | Freq: Every day | ORAL | Status: DC

## 2020-05-31 MED ORDER — ASCORBIC ACID 500 MG PO TABS
500.0000 mg | ORAL_TABLET | Freq: Every day | ORAL | Status: DC
  Administered 2020-05-31 – 2020-06-02 (×3): 500 mg via ORAL
  Filled 2020-05-31 (×3): qty 1

## 2020-05-31 MED ORDER — SODIUM CHLORIDE 0.9% FLUSH
3.0000 mL | INTRAVENOUS | Status: DC | PRN
  Administered 2020-05-31: 3 mL via INTRAVENOUS

## 2020-05-31 MED ORDER — PRAVASTATIN SODIUM 40 MG PO TABS
40.0000 mg | ORAL_TABLET | Freq: Every day | ORAL | Status: DC
  Administered 2020-06-01 – 2020-06-02 (×2): 40 mg via ORAL
  Filled 2020-05-31 (×2): qty 1

## 2020-05-31 MED ORDER — HEPARIN BOLUS VIA INFUSION
6000.0000 [IU] | Freq: Once | INTRAVENOUS | Status: AC
  Administered 2020-05-31: 6000 [IU] via INTRAVENOUS
  Filled 2020-05-31: qty 6000

## 2020-05-31 MED ORDER — VITAMIN D 25 MCG (1000 UNIT) PO TABS
5000.0000 [IU] | ORAL_TABLET | Freq: Every day | ORAL | Status: DC
  Administered 2020-06-01 – 2020-06-02 (×2): 5000 [IU] via ORAL
  Filled 2020-05-31 (×2): qty 5

## 2020-05-31 MED ORDER — ONDANSETRON HCL 4 MG/2ML IJ SOLN: 4.0000 mg | Freq: Four times a day (QID) | INTRAMUSCULAR | Status: DC | PRN

## 2020-05-31 MED ORDER — ONDANSETRON HCL 4 MG PO TABS: 4.0000 mg | ORAL_TABLET | Freq: Four times a day (QID) | ORAL | Status: DC | PRN

## 2020-05-31 MED ORDER — MORPHINE SULFATE (PF) 2 MG/ML IV SOLN
2.0000 mg | INTRAVENOUS | Status: DC | PRN
  Filled 2020-05-31: qty 1

## 2020-05-31 MED ORDER — BENAZEPRIL HCL 5 MG PO TABS
10.0000 mg | ORAL_TABLET | Freq: Every day | ORAL | Status: DC
  Filled 2020-05-31: qty 2

## 2020-05-31 MED ORDER — PERFLUTREN LIPID MICROSPHERE
1.0000 mL | INTRAVENOUS | Status: AC | PRN
  Administered 2020-05-31: 3 mL via INTRAVENOUS
  Filled 2020-05-31: qty 10

## 2020-05-31 MED ORDER — SODIUM CHLORIDE 0.9 % IV SOLN: 250.0000 mL | INTRAVENOUS | Status: DC | PRN

## 2020-05-31 MED ORDER — ASPIRIN EC 81 MG PO TBEC
81.0000 mg | DELAYED_RELEASE_TABLET | Freq: Every day | ORAL | Status: DC
  Administered 2020-05-31 – 2020-06-02 (×3): 81 mg via ORAL
  Filled 2020-05-31 (×3): qty 1

## 2020-05-31 MED ORDER — HEPARIN (PORCINE) 25000 UT/250ML-% IV SOLN
1600.0000 [IU]/h | INTRAVENOUS | Status: AC
  Administered 2020-05-31: 1900 [IU]/h via INTRAVENOUS
  Administered 2020-05-31 – 2020-06-02 (×3): 1600 [IU]/h via INTRAVENOUS
  Filled 2020-05-31 (×5): qty 250

## 2020-05-31 MED ORDER — OMEGA-3-ACID ETHYL ESTERS 1 G PO CAPS
1.0000 g | ORAL_CAPSULE | Freq: Every day | ORAL | Status: DC
  Administered 2020-05-31 – 2020-06-02 (×3): 1 g via ORAL
  Filled 2020-05-31 (×3): qty 1

## 2020-05-31 MED ORDER — SODIUM CHLORIDE 0.9% FLUSH
3.0000 mL | Freq: Two times a day (BID) | INTRAVENOUS | Status: DC
  Administered 2020-05-31 – 2020-06-02 (×5): 3 mL via INTRAVENOUS

## 2020-05-31 NOTE — H&P (Signed)
History and Physical    FERNADO [name] GXQ:[id number] DOB: 07/02/1946 DOA: 05/31/2020  PCP: Dettinger, [name] Kaufmann, MD   Patient coming from: Home  I have personally briefly reviewed patient's old medical records in [place]  Chief Complaint: Shortness of breath  HPI: [name] [name] is a 74 y.o. male with medical history significant for coronary artery disease, hypertension, Covid diagnosed (05/04/20) who presents to the ER for evaluation of shortness of breath.  Patient has had exertional shortness of breath for several weeks which has worsened over the last couple of days.  He has difficulty breathing with any form of activity.  He has no lower extremity swelling and denies having orthopnea.  He has no chest pain, no fever, no cough and feels that his symptoms from the coronavirus have improved considerably.  He denies having any associated nausea, no vomiting or diaphoresis.  Per EMS  he had room air pulse oximetry of 88% at rest and was placed on 2 L of oxygen with improvement in his pulse oximetry.  Labs show sodium 137, potassium 3.7, chloride 99, bicarb 25, glucose 134, BUN 6, creatinine 0.92, calcium 9.0, BNP 82, troponin 36, white count 7.0, hemoglobin 14.1, hematocrit 40.8, MCV 98.8, RDW 12.7, platelet count 177, D-dimer 18.87 CT angiogram of the chest is positive for acute PE with CT evidence of right heart strain consistent with at least submassive PE.  Areas of pulmonary infarction bilaterally (Rt > Lt).  Given the slightly nodular configuration of some lesions in the right middle and right lower lobes follow-up CT chest without contrast in 4 to 6 weeks could be performed to ensure resolution. Chest x-ray reviewed by me shows bilateral pulmonary infiltrates Twelve-lead EKG reviewed by me shows normal sinus rhythm, LVH right axis deviation    ED Course: Patient is a 74 year old Caucasian male who presents to the ER for evaluation of worsening shortness of breath with exertion  over the last couple of weeks.  Patient was recently diagnosed with COVID-19 viral infection (05/04/20).  Room air pulse oximetry was 88% and improved on 2 L of oxygen.  CT angiogram of the chest shows submassive pulmonary embolism with right axis deviation.  Discussed with ER physician assistant who has placed a consult for critical care to evaluate patient.  He will be admitted to the hospital for further evaluation.  Review of Systems: As per HPI otherwise 10 point review of systems negative.    Past Medical History:  Diagnosis Date  . Colon polyp   . GERD (gastroesophageal reflux disease)   . Hemorrhoid   . Hyperplasia of prostate   . Hypertension   . Other and unspecified hyperlipidemia   . Other testicular hypofunction   . Sinus bradycardia     Past Surgical History:  Procedure Laterality Date  . APPENDECTOMY    . HAND SURGERY Right      reports that he has been smoking cigars. His smokeless tobacco use includes chew. He reports current alcohol use. He reports that he does not use drugs.  Allergies  Allergen Reactions  . Cialis [Tadalafil]   . Viagra [Sildenafil Citrate] Other (See Comments)    heartburn    Family History  Problem Relation Age of Onset  . Heart attack Mother   . Stroke Mother   . Diabetes Father   . Diabetes Daughter      Prior to Admission medications   Medication Sig Start Date End Date Taking? Authorizing Provider  albuterol (VENTOLIN HFA) 108 (  90 Base) MCG/ACT inhaler Inhale 2 puffs into the lungs every 6 (six) hours as needed for shortness of breath. 05/14/20  Yes [provider]  aspirin 81 MG tablet Take 81 mg by mouth daily.     Yes [provider]  benazepril (LOTENSIN) 10 MG tablet Take 1 tablet (10 mg total) by mouth daily. 04/12/20  Yes Dettinger, [name] Kaufmann, MD  Cholecalciferol (VITAMIN D-3) 5000 UNITS TABS Take 5,000 Units by mouth daily.    Yes [provider]  Garlic 9381 MG CAPS Take 2 capsules by mouth  daily.   Yes [provider]  NON FORMULARY Take 1 tablet by mouth daily. Equate allergy tablet   Yes [provider]  Omega-3 Fatty Acids (FISH OIL) 1000 MG CAPS Take 3,000 capsules by mouth daily.    Yes [provider]  omeprazole (PRILOSEC) 20 MG capsule Take 1 capsule (20 mg total) by mouth daily. 04/12/20  Yes Dettinger, [name] Kaufmann, MD  pravastatin (PRAVACHOL) 40 MG tablet Take 1 tablet (40 mg total) by mouth daily. 04/12/20  Yes Dettinger, [name] Kaufmann, MD  vitamin C (ASCORBIC ACID) 500 MG tablet Take 500 mg by mouth daily.   Yes [provider]  amLODipine (NORVASC) 5 MG tablet Take 1 tablet (5 mg total) by mouth daily. Patient not taking: Reported on 05/31/2020 04/12/20 05/31/20  Dettinger, [name] Kaufmann, MD    Physical Exam: Vitals:   05/31/20 0800 05/31/20 0900 05/31/20 0930 05/31/20 1106  BP: 94/71 93/65 97/62  109/63  Pulse: 69 64 63 (!) 57  Resp: 15 18 20 18   Temp:      TempSrc:      SpO2: 94% 96% 98% 100%  Weight:      Height:         Vitals:   05/31/20 0800 05/31/20 0900 05/31/20 0930 05/31/20 1106  BP: 94/71 93/65 97/62  109/63  Pulse: 69 64 63 (!) 57  Resp: 15 18 20 18   Temp:      TempSrc:      SpO2: 94% 96% 98% 100%  Weight:      Height:        Constitutional: NAD, alert and oriented x 3.  Appears comfortable and in no distress Eyes: PERRL, lids and conjunctivae normal ENMT: Mucous membranes are moist.  Neck: normal, supple, no masses, no thyromegaly Respiratory: Bilateral air entry, no wheezing, no crackles. Normal respiratory effort. No accessory muscle use.  Cardiovascular: Regular rate and rhythm, no murmurs / rubs / gallops. No extremity edema. 2+ pedal pulses. No carotid bruits.  Abdomen: no tenderness, no masses palpated. No hepatosplenomegaly. Bowel sounds positive.  Central adiposity Musculoskeletal: no clubbing / cyanosis. No joint deformity upper and lower extremities.  Skin: no rashes, lesions, ulcers.  Neurologic: No gross  focal neurologic deficit. Psychiatric: Normal mood and affect.   Labs on Admission: I have personally reviewed following labs and imaging studies  CBC: Recent Labs  Lab 05/31/20 0553  WBC 7.0  HGB 14.1  HCT 40.8  MCV 98.8  PLT 017   Basic Metabolic Panel: Recent Labs  Lab 05/31/20 0553  NA 137  K 3.7  CL 99  CO2 25  GLUCOSE 134*  BUN 6*  CREATININE 0.92  CALCIUM 9.0   GFR: Estimated Creatinine Clearance: 81.7 mL/min (by C-G formula based on SCr of 0.92 mg/dL). Liver Function Tests: No results for input(s): AST, ALT, ALKPHOS, BILITOT, PROT, ALBUMIN in the last 168 hours. No results for input(s): LIPASE, AMYLASE in the last  168 hours. No results for input(s): AMMONIA in the last 168 hours. Coagulation Profile: No results for input(s): INR, PROTIME in the last 168 hours. Cardiac Enzymes: No results for input(s): CKTOTAL, CKMB, CKMBINDEX, TROPONINI in the last 168 hours. BNP (last 3 results) No results for input(s): PROBNP in the last 8760 hours. HbA1C: No results for input(s): HGBA1C in the last 72 hours. CBG: No results for input(s): GLUCAP in the last 168 hours. Lipid Profile: No results for input(s): CHOL, HDL, LDLCALC, TRIG, CHOLHDL, LDLDIRECT in the last 72 hours. Thyroid Function Tests: No results for input(s): TSH, T4TOTAL, FREET4, T3FREE, THYROIDAB in the last 72 hours. Anemia Panel: No results for input(s): VITAMINB12, FOLATE, FERRITIN, TIBC, IRON, RETICCTPCT in the last 72 hours. Urine analysis:    Component Value Date/Time   COLORURINE RED (A) 04/09/2014 2250   APPEARANCEUR Clear 09/09/2018 1113   LABSPEC 1.018 04/09/2014 2250   PHURINE 6.0 04/09/2014 2250   GLUCOSEU Negative 09/09/2018 1113   HGBUR LARGE (A) 04/09/2014 2250   BILIRUBINUR Negative 09/09/2018 1113   KETONESUR NEGATIVE 04/09/2014 2250   PROTEINUR Negative 09/09/2018 1113   PROTEINUR 100 (A) 04/09/2014 2250   UROBILINOGEN negative 04/11/2015 1012   UROBILINOGEN 1.0 04/09/2014 2250    NITRITE Negative 09/09/2018 1113   NITRITE NEGATIVE 04/09/2014 2250   LEUKOCYTESUR Trace (A) 09/09/2018 1113    Radiological Exams on Admission: DG Chest 2 View  Result Date: 05/31/2020 CLINICAL DATA:  Shortness of breath 3 weeks after COVID EXAM: CHEST - 2 VIEW COMPARISON:  03/07/2017 FINDINGS: Minimal patchy airspace opacity on both sides, peripheral appearing. Lung volumes are mildly low but stable. No effusion or pneumothorax. Normal heart size and stable mediastinal contours. IMPRESSION: Mild bilateral pulmonary infiltrate. Electronically Signed   By: Monte Fantasia M.D.   On: 05/31/2020 06:15   CT Angio Chest PE W and/or Wo Contrast  Result Date: 05/31/2020 CLINICAL DATA:  Elevated D-dimer, shortness of breath. EXAM: CT ANGIOGRAPHY CHEST WITH CONTRAST TECHNIQUE: Multidetector CT imaging of the chest was performed using the standard protocol during bolus administration of intravenous contrast. Multiplanar CT image reconstructions and MIPs were obtained to evaluate the vascular anatomy. CONTRAST:  63mL OMNIPAQUE IOHEXOL 350 MG/ML SOLN COMPARISON:  None. FINDINGS: Cardiovascular: There are filling defects in the pulmonary arteries, extending distally from the main pulmonary arteries bilaterally. RV/LV ratio is 1.2. Atherosclerotic calcification of the aorta and coronary arteries. Heart is enlarged. No pericardial effusion. Mediastinum/Nodes: No pathologically enlarged mediastinal, hilar or axillary lymph nodes. Esophagus is unremarkable. Lungs/Pleura: Patchy areas of peripheral ground-glass are seen predominantly in the right upper, right middle and right lower lobes. Nodular consolidation in the lateral segment right middle lobe (6/76 and right lower lobe 6/78). Minimal ground-glass involvement of the left upper lobe. No pleural fluid. Airway is unremarkable. Upper Abdomen: Visualized portions of the liver, gallbladder, adrenal glands, kidneys, spleen, pancreas, stomach and bowel are  unremarkable. No upper abdominal adenopathy. Musculoskeletal: Degenerative changes in the spine. No worrisome lytic or sclerotic lesions. Review of the MIP images confirms the above findings. IMPRESSION: 1. Positive for acute PE with CT evidence of right heart strain (RV/LV Ratio = 1.2) consistent with at least submassive (intermediate risk) PE. The presence of right heart strain has been associated with an increased risk of morbidity and mortality. Critical Value/emergent results were called by telephone at the time of interpretation on 05/31/2020 at 10:29 am to provider [facility] , who verbally acknowledged these results. 2. Areas pulmonary infarction bilaterally, right greater than left.  Given a slightly nodular configuration of some lesions in the right middle and right lower lobes, follow-up CT chest without contrast in 4-6 weeks could be performed to ensure resolution and exclude underlying malignancy, as clinically indicated. 3.  Aortic atherosclerosis (ICD10-I70.0). Electronically Signed   By: Lorin Picket M.D.   On: 05/31/2020 10:31    EKG: Independently reviewed.  Normal sinus rhythm, RVH with right axis deviation  Assessment/Plan Principal Problem:   Acute pulmonary embolism (HCC) Active Problems:   Hypertension, essential   GERD (gastroesophageal reflux disease)   Dyslipidemia   Personal history of COVID-19     Acute pulmonary embolism In a patient with a history of recent COVID-19 infection. (Positive COVID test 05/04/20) Patient presents for evaluation of worsening shortness of breath with exertion and was hypoxic with room air pulse oximetry of 88% Patient is currently on 2 L of oxygen with improvement in his pulse oximetry to 94% CT angiogram shows submassive PE with right heart strain and pulmonary infarction in the early Obtain 2D echocardiogram Place patient on a heparin drip   Hypertension Hold benazepril for now   Dyslipidemia Continue Lovaza and  statins   GERD Continue oral PPI    DVT prophylaxis: Heparin Code Status: DO NOT RESUSCITATE Family Communication: Greater than 50% of time was spent was spent discussing plan of care with him and his daughter at the bedside.  All questions and concerns have been addressed and they verbalized understanding and agree with the plan.  CODE STATUS was discussed and patient wishes to be placed on a do.  Resuscitate status Disposition Plan: Back to previous home environment Consults called: Critical Care    Takari Duncombe MD [facility]     05/31/2020, 12:10 PM

## 2020-05-31 NOTE — Progress Notes (Signed)
  Echocardiogram 2D Echocardiogram has been performed with [facility].  [name] [name] 05/31/2020, 2:46 PM

## 2020-05-31 NOTE — Progress Notes (Signed)
ANTICOAGULATION CONSULT NOTE - Initial Consult  Pharmacy Consult for heparin  Indication: pulmonary embolus  Allergies  Allergen Reactions   Cialis [Tadalafil]    Viagra [Sildenafil Citrate] Other (See Comments)    heartburn    Patient Measurements: Height: 5\' 6"  (167.6 cm) Weight: 112.5 kg (248 lb) IBW/kg (Calculated) : 63.8 Heparin Dosing Weight: 112 kg   Vital Signs: Temp: 98 F (36.7 C) (11/03 0547) Temp Source: Oral (11/03 0547) BP: 93/65 (11/03 0900) Pulse Rate: 64 (11/03 0900)  Labs: Recent Labs    05/31/20 0553 05/31/20 0823  HGB 14.1  --   HCT 40.8  --   PLT 177  --   CREATININE 0.92  --   TROPONINIHS 38* 36*    Estimated Creatinine Clearance: 81.7 mL/min (by C-G formula based on SCr of 0.92 mg/dL).   Medical History: Past Medical History:  Diagnosis Date   Colon polyp    GERD (gastroesophageal reflux disease)    Hemorrhoid    Hyperplasia of prostate    Hypertension    Other and unspecified hyperlipidemia    Other testicular hypofunction    Sinus bradycardia     Medications:  (Not in a hospital admission)   Assessment: 13 YOM who presents with shortness of breath found to have bilateral pulmonary emboli with associated R heart strain. Pharmacy consulted to start IV heparin   H/H and Plt wnl. SCr wnl   Goal of Therapy:  Heparin level 0.3-0.7 units/ml Monitor platelets by anticoagulation protocol: Yes   Plan:  -Heparin 6000 units IV bolus followed by IV heparin infusion at 1900 units/hr  -F/u 8 hr HL -Monitor daily HL, CBC and s/s of bleeding  Albertina Parr, PharmD., BCPS, BCCCP Clinical Pharmacist Please refer to [facility] for unit-specific pharmacist

## 2020-05-31 NOTE — ED Triage Notes (Signed)
Patient is 3 weeks post covid, having shortness of breath and low oxygen sat upon arrival to ED.  Patient denies any chest pain, nausea or vomiting at this time.  Patient with oxygen sat of 88% on RA.

## 2020-05-31 NOTE — ED Notes (Signed)
Worked patient around nurses station patient oxygen level stayed at 96 room air but when patient went to sit down patient oxygen level went down to 87 placed patient back on oxygen patient is resting with family at bedside and call bell in reach

## 2020-05-31 NOTE — ED Provider Notes (Addendum)
[place] EMERGENCY DEPARTMENT Provider Note   CSN: [id number] Arrival date & time: 05/31/20  2297     History Chief Complaint  Patient presents with  . Shortness of Breath    [name] [name] is a 74 y.o. male.  Patient with history of CAD, HTN, covid (diagnosed 05/04/20) -- presents to the emergency department for evaluation of shortness of breath.  Patient was transported by EMS to the ED for shortness of breath and trouble breathing.  Of note, patient's wife was also transported with chest pain and shortness of breath this morning.  Patient was found to have low oxygen room air saturations of 88%.  Patient reports that he has been having shortness of breath with exertion for several months.  He states that he thought he was getting better from coronavirus standpoint.  He denies chest pain or abdominal pain.  No fevers or cough.  No lower extremity swelling.  He states that he has been sleeping flat in recliner recently.  Symptoms are worse with activities.  No associated nausea, vomiting, diaphoresis.  No treatments prior to arrival.        Past Medical History:  Diagnosis Date  . Colon polyp   . GERD (gastroesophageal reflux disease)   . Hemorrhoid   . Hyperplasia of prostate   . Hypertension   . Other and unspecified hyperlipidemia   . Other testicular hypofunction   . Sinus bradycardia     Patient Active Problem List   Diagnosis Date Noted  . Aortic atherosclerosis ([place]) 03/07/2017  . B12 deficiency 12/31/2015  . Hyperlipidemia 07/01/2014  . Erectile dysfunction 02/28/2014  . Vitamin D deficiency 12/14/2012  . Testosterone deficiency   . Hypertension, essential   . Sinus bradycardia   . GERD (gastroesophageal reflux disease)   . Colon polyp history   . BPH (benign prostatic hyperplasia)     Past Surgical History:  Procedure Laterality Date  . APPENDECTOMY    . HAND SURGERY Right        Family History  Problem Relation Age of Onset    . Heart attack Mother   . Stroke Mother   . Diabetes Father   . Diabetes Daughter     Social History   Tobacco Use  . Smoking status: Light Tobacco Smoker    Types: Cigars  . Smokeless tobacco: Current User    Types: Chew  Vaping Use  . Vaping Use: Never used  Substance Use Topics  . Alcohol use: Yes    Comment: occasional  . Drug use: No    Home Medications Prior to Admission medications   Medication Sig Start Date End Date Taking? Authorizing Provider  amLODipine (NORVASC) 5 MG tablet Take 1 tablet (5 mg total) by mouth daily. 04/12/20   Dettinger, Fransisca Kaufmann, MD  aspirin 81 MG tablet Take 81 mg by mouth daily.      [provider]  benazepril (LOTENSIN) 10 MG tablet Take 1 tablet (10 mg total) by mouth daily. 04/12/20   Dettinger, Fransisca Kaufmann, MD  Cholecalciferol (VITAMIN D-3) 5000 UNITS TABS Take 1 tablet by mouth daily.      [provider]  Garlic 9892 MG CAPS Take 2 capsules by mouth daily.    [provider]  lactulose (CHRONULAC) 10 GM/15ML solution Take 15 mLs (10 g total) by mouth daily as needed for mild constipation. 04/12/20   Dettinger, Fransisca Kaufmann, MD  NON FORMULARY Take 1 tablet by mouth daily. Equate allergy tablet  [provider]  Omega-3 Fatty Acids (FISH OIL) 1000 MG CAPS Take 3 capsules by mouth daily.     [provider]  omeprazole (PRILOSEC) 20 MG capsule Take 1 capsule (20 mg total) by mouth daily. 04/12/20   Dettinger, Fransisca Kaufmann, MD  pravastatin (PRAVACHOL) 40 MG tablet Take 1 tablet (40 mg total) by mouth daily. 04/12/20   Dettinger, Fransisca Kaufmann, MD  vitamin C (ASCORBIC ACID) 500 MG tablet Take 500 mg by mouth daily.    [provider]    Allergies    Cialis [tadalafil] and Viagra [sildenafil citrate]  Review of Systems   Review of Systems  Constitutional: Negative for fever.  HENT: Negative for rhinorrhea and sore throat.   Eyes: Negative for redness.  Respiratory: Positive for shortness of breath.  Negative for cough.   Cardiovascular: Negative for chest pain and leg swelling.  Gastrointestinal: Negative for abdominal pain, diarrhea, nausea and vomiting.  Genitourinary: Negative for dysuria and hematuria.  Musculoskeletal: Negative for myalgias.  Skin: Negative for rash.  Neurological: Negative for headaches.    Physical Exam Updated Vital Signs BP 111/71 (BP Location: Left Arm)   Pulse 98   Temp 98 F (36.7 C) (Oral)   Resp 16   SpO2 95%   Physical Exam Vitals and nursing note reviewed.  Constitutional:      Appearance: He is well-developed. He is not diaphoretic.  HENT:     Head: Normocephalic and atraumatic.     Mouth/Throat:     Mouth: Mucous membranes are not dry.  Eyes:     Conjunctiva/sclera: Conjunctivae normal.  Neck:     Vascular: Normal carotid pulses. No carotid bruit or JVD.     Trachea: Trachea normal. No tracheal deviation.  Cardiovascular:     Rate and Rhythm: Normal rate and regular rhythm.     Pulses: No decreased pulses.     Heart sounds: Normal heart sounds, S1 normal and S2 normal. Heart sounds not distant. No murmur heard.   Pulmonary:     Effort: Pulmonary effort is normal. No respiratory distress.     Breath sounds: Normal breath sounds. No wheezing, rhonchi or rales.  Chest:     Chest wall: No tenderness.  Abdominal:     General: Bowel sounds are normal.     Palpations: Abdomen is soft.     Tenderness: There is no abdominal tenderness. There is no guarding or rebound.  Musculoskeletal:     Cervical back: Normal range of motion and neck supple. No muscular tenderness.     Right lower leg: No tenderness. No edema.     Left lower leg: No tenderness. No edema.  Skin:    General: Skin is warm and dry.     Coloration: Skin is not pale.  Neurological:     Mental Status: He is alert.     ED Results / Procedures / Treatments   Labs (all labs ordered are listed, but only abnormal results are displayed) Labs Reviewed  BASIC METABOLIC  PANEL - Abnormal; Notable for the following components:      Result Value   Glucose, Bld 134 (*)    BUN 6 (*)    All other components within normal limits  CBC - Abnormal; Notable for the following components:   RBC 4.13 (*)    MCH 34.1 (*)    All other components within normal limits  D-DIMER, QUANTITATIVE (NOT AT [facility]) - Abnormal; Notable for the following components:   D-Dimer, Quant  18.87 (*)    All other components within normal limits  TROPONIN I (HIGH SENSITIVITY) - Abnormal; Notable for the following components:   Troponin I (High Sensitivity) 38 (*)    All other components within normal limits  TROPONIN I (HIGH SENSITIVITY) - Abnormal; Notable for the following components:   Troponin I (High Sensitivity) 36 (*)    All other components within normal limits  BRAIN NATRIURETIC PEPTIDE  HEPARIN LEVEL (UNFRACTIONATED)    ED ECG REPORT   Date: 05/31/2020  Rate: 100  Rhythm: normal sinus rhythm  QRS Axis: right  Intervals: normal  ST/T Wave abnormalities: normal  Conduction Disutrbances:none  Narrative Interpretation:   Old EKG Reviewed: unchanged except now right axis deviation compared to 08/2018  I have personally reviewed the EKG tracing and agree with the computerized printout as noted.  Radiology DG Chest 2 View  Result Date: 05/31/2020 CLINICAL DATA:  Shortness of breath 3 weeks after COVID EXAM: CHEST - 2 VIEW COMPARISON:  03/07/2017 FINDINGS: Minimal patchy airspace opacity on both sides, peripheral appearing. Lung volumes are mildly low but stable. No effusion or pneumothorax. Normal heart size and stable mediastinal contours. IMPRESSION: Mild bilateral pulmonary infiltrate. Electronically Signed   By: Monte Fantasia M.D.   On: 05/31/2020 06:15   CT Angio Chest PE W and/or Wo Contrast  Result Date: 05/31/2020 CLINICAL DATA:  Elevated D-dimer, shortness of breath. EXAM: CT ANGIOGRAPHY CHEST WITH CONTRAST TECHNIQUE: Multidetector CT imaging of the chest was  performed using the standard protocol during bolus administration of intravenous contrast. Multiplanar CT image reconstructions and MIPs were obtained to evaluate the vascular anatomy. CONTRAST:  56mL OMNIPAQUE IOHEXOL 350 MG/ML SOLN COMPARISON:  None. FINDINGS: Cardiovascular: There are filling defects in the pulmonary arteries, extending distally from the main pulmonary arteries bilaterally. RV/LV ratio is 1.2. Atherosclerotic calcification of the aorta and coronary arteries. Heart is enlarged. No pericardial effusion. Mediastinum/Nodes: No pathologically enlarged mediastinal, hilar or axillary lymph nodes. Esophagus is unremarkable. Lungs/Pleura: Patchy areas of peripheral ground-glass are seen predominantly in the right upper, right middle and right lower lobes. Nodular consolidation in the lateral segment right middle lobe (6/76 and right lower lobe 6/78). Minimal ground-glass involvement of the left upper lobe. No pleural fluid. Airway is unremarkable. Upper Abdomen: Visualized portions of the liver, gallbladder, adrenal glands, kidneys, spleen, pancreas, stomach and bowel are unremarkable. No upper abdominal adenopathy. Musculoskeletal: Degenerative changes in the spine. No worrisome lytic or sclerotic lesions. Review of the MIP images confirms the above findings. IMPRESSION: 1. Positive for acute PE with CT evidence of right heart strain (RV/LV Ratio = 1.2) consistent with at least submassive (intermediate risk) PE. The presence of right heart strain has been associated with an increased risk of morbidity and mortality. Critical Value/emergent results were called by telephone at the time of interpretation on 05/31/2020 at 10:29 am to provider [facility] , who verbally acknowledged these results. 2. Areas pulmonary infarction bilaterally, right greater than left. Given a slightly nodular configuration of some lesions in the right middle and right lower lobes, follow-up CT chest without contrast in 4-6 weeks  could be performed to ensure resolution and exclude underlying malignancy, as clinically indicated. 3.  Aortic atherosclerosis (ICD10-I70.0). Electronically Signed   By: Lorin Picket M.D.   On: 05/31/2020 10:31    Procedures Procedures (including critical care time)  Medications Ordered in ED Medications  heparin ADULT infusion 100 units/mL (25000 units/272mL sodium chloride 0.45%) (1,900 Units/hr Intravenous New Bag/Given 05/31/20 1103)  iohexol (  OMNIPAQUE) 350 MG/ML injection 80 mL (80 mLs Intravenous Contrast Given 05/31/20 1015)  heparin bolus via infusion 6,000 Units (6,000 Units Intravenous Bolus from Bag 05/31/20 1104)    ED Course  I have reviewed the triage vital signs and the nursing notes.  Pertinent labs & imaging results that were available during my care of the patient were reviewed by me and considered in my medical decision making (see chart for details).  Patient seen and examined.  At time of exam patient appears comfortable.  He is not having active chest pain and I have low concern for ACS.  Patient reports ongoing exertional shortness of breath over several months.  Symptoms seem to be subacute.  He does not have orthopnea, lower extremity edema, crackles to suggest heart failure exacerbation.  He does not smoke cigarettes (+ cigars per chart) and had does not have wheezing making COPD less likely.  Chest x-ray shows mild bilateral pulmonary infiltrate (reviewed by myself, not pronounced), however no fevers, tachycardia, cough to suggest sepsis from pneumonia.  D-dimer added and will have patient ambulate on pulse oximetry.  He is currently at 97% on 2 L nasal cannula.  Vital signs reviewed and are as follows: BP 111/71 (BP Location: Left Arm)   Pulse 98   Temp 98 F (36.7 C) (Oral)   Resp 16   SpO2 95%   8:21 AM D-dimer markedly elevated. CT angio ordered. Discussed with Dr. Sherry Ruffing who will see.   10:39 AM CT significant for bilateral pulmonary emboli with right  heart strain.  Patient updated on results.  His daughter is now at bedside.  Patient appears comfortable.  Heparin ordered.  Plan for admission.  CRITICAL CARE Performed by: Carlisle Cater PA-C Total critical care time: 40 minutes Critical care time was exclusive of separately billable procedures and treating other patients. Critical care was necessary to treat or prevent imminent or life-threatening deterioration. Critical care was time spent personally by me on the following activities: development of treatment plan with patient and/or surrogate as well as nursing, discussions with consultants, evaluation of patient's response to treatment, examination of patient, obtaining history from patient or surrogate, ordering and performing treatments and interventions, ordering and review of laboratory studies, ordering and review of radiographic studies, pulse oximetry and re-evaluation of patient's condition.  11:17 AM I spoke with Dr. Francine Graven who will admit patient.   I spoke with critical care -- they agree no emergent interventions needed. They will consult and provide additional recommendations.     MDM Rules/Calculators/A&P                          Admit.   Final Clinical Impression(s) / ED Diagnoses Final diagnoses:  Bilateral pulmonary embolism ([place])  Acute pulmonary embolism with acute cor pulmonale, unspecified pulmonary embolism type [facility])    Rx / DC Orders ED Discharge Orders    None         Carlisle Cater, PA-C 05/31/20 1119    Tegeler, Gwenyth Allegra, MD 05/31/20 1624

## 2020-05-31 NOTE — Consult Note (Addendum)
NAME:  [name] [name], MRN:  [id number], DOB:  09-21-1945, LOS: 0 ADMISSION DATE:  05/31/2020, CONSULTATION DATE:  05/31/2020 REFERRING MD:  Dr. Francine Graven , CHIEF COMPLAINT: Pulmonary embolism   Brief History   74 year old male with recent diagnosis of Covid at 05/04/2020 who presented with new complaints of shortness of breath.  Evaluation on admission revealed a submassive PE with CT evidence of right heart strain.  PCCM consulted for further pulmonary consultation regarding possible interventions for PE  History of present illness   [name] [name] is a 74 year old male with a past medical history significant for recent COVID-19 05/04/2020, GERD, hypertension, sinus bradycardia, and colon polyps who presented to the emergency department with complaints of shortness of breath.  Patient is 3 weeks post Covid and began having recurrent shortness of breath and hypoxia at home prompting presentation to emergency department.  Of note patient's wife was also transported to the emergency department for chest pain and shortness of breath as well.  He denies any other additional complaints including no fever, chills, chest pain, lower extremity swelling or abdominal discomfort.  Patient and wife denies any current tobacco abuse, patient does report remote history of occasional cigar use with last use approximately 10 years ago  On admission vital signs within normal limits.  Presenting blood pressure was 111/71 with drop of 93/65.  On chart review patient's most recent outpatient visit to primary care was 04/12/2020 at that time blood pressure was 137/74.  Lab work on admission significant for mildly elevated troponin of 38  D-dimer 18.37.  CT angio chest obtained and revealed acute PE with at least submassive presentation given CT evidence of right heart strain.  Patient was admitted under hospitalist services and PCCM was consulted for further management submassive PE.  Past Medical History  Recent  Covid Hypertension GERD Sinus bradycardia Colon polyps  Significant Hospital Events   Admitted 11/3 for PE Heparin drip started  Consults:  Pulmonary critical care  Procedures:  None  Significant Diagnostic Tests:  CT angiogram chest/3 > acute PE with CT evidence of right heart strain (RV?LV ratio = 1.2) areas of pulmonary infarction bilaterally right greater than left  Micro Data:    Antimicrobials:     Interim history/subjective:  Lying on ED stretcher, reports he feels well with no acute complaints.  Wife at bedside and updated regarding plan of care  Objective   Blood pressure 109/63, pulse (!) 57, temperature 98 F (36.7 C), temperature source Oral, resp. rate 18, height 5\' 6"  (1.676 m), weight 112.5 kg, SpO2 100 %.       No intake or output data in the 24 hours ending 05/31/20 1146 Filed Weights   05/31/20 0721  Weight: 112.5 kg    Examination: General: Chronically ill appearing elderly deconditioned frail elderly gentleman lying on the ED stretcher in no acute distress HEENT: [place]/AT, MM pink/moist, PERRL, appears hard of hearing Neuro: Alert and oriented x3, nonfocal CV: s1s2 regular rate and rhythm, no murmur, rubs, or gallops,  PULM: Clear to auscultation bilaterally, no pleuritic chest pain, no observed respiratory fatigue GI: soft, bowel sounds active in all 4 quadrants, non-tender, non-distended,  Extremities: warm/dry, no edema  Skin: no rashes or lesions  Resolved Hospital Problem list     Assessment & Plan:  Submassive PE -Acute PE with CT evidence of right heart strain (RV?LV ratio = 1.2) areas of pulmonary infarction bilaterally right greater than left -Low risk on PESI P: Continue systemic heparin Goal INR >  2 Obtain stat echo Obtain lower extremity Dopplers Continue supplemental oxygen  Mobilize as able  No acute indications for further aggressive interventions including TPA Would benefit from outpatient pulmonary follow up will set  this up  PCCM will continue to follow   Best practice:  Diet: Heart heathy  Pain/Anxiety/Delirium protocol (if indicated): PRN VAP protocol (if indicated): N/A DVT prophylaxis: Heparin drip  GI prophylaxis: PPI Glucose control: Monitor  Mobility: Up with assistance after LE dopplers  Code Status: Full Family Communication: Updated at bedside Disposition: Floor   Labs   CBC: Recent Labs  Lab 05/31/20 0553  WBC 7.0  HGB 14.1  HCT 40.8  MCV 98.8  PLT 409    Basic Metabolic Panel: Recent Labs  Lab 05/31/20 0553  NA 137  K 3.7  CL 99  CO2 25  GLUCOSE 134*  BUN 6*  CREATININE 0.92  CALCIUM 9.0   GFR: Estimated Creatinine Clearance: 81.7 mL/min (by C-G formula based on SCr of 0.92 mg/dL). Recent Labs  Lab 05/31/20 0553  WBC 7.0    Liver Function Tests: No results for input(s): AST, ALT, ALKPHOS, BILITOT, PROT, ALBUMIN in the last 168 hours. No results for input(s): LIPASE, AMYLASE in the last 168 hours. No results for input(s): AMMONIA in the last 168 hours.  ABG No results found for: PHART, PCO2ART, PO2ART, HCO3, TCO2, ACIDBASEDEF, O2SAT   Coagulation Profile: No results for input(s): INR, PROTIME in the last 168 hours.  Cardiac Enzymes: No results for input(s): CKTOTAL, CKMB, CKMBINDEX, TROPONINI in the last 168 hours.  HbA1C: Hemoglobin A1C  Date/Time Value Ref Range Status  10/11/2013 08:55 AM 5.6%  Final    Comment:    normal range 4.2-6.3%    CBG: No results for input(s): GLUCAP in the last 168 hours.  Review of Systems:   Gen: Denies fever, chills, weight change, fatigue, night sweats HEENT: Denies blurred vision, double vision, hearing loss, tinnitus, sinus congestion, rhinorrhea, sore throat, neck stiffness, dysphagia PULM: Denies shortness of breath, cough, sputum production, hemoptysis, wheezing CV: Denies chest pain, edema, orthopnea, paroxysmal nocturnal dyspnea, palpitations GI: Denies abdominal pain, nausea, vomiting, diarrhea,  hematochezia, melena, constipation, change in bowel habits GU: Denies dysuria, hematuria, polyuria, oliguria, urethral discharge Endocrine: Denies hot or cold intolerance, polyuria, polyphagia or appetite change Derm: Denies rash, dry skin, scaling or peeling skin change Heme: Denies easy bruising, bleeding, bleeding gums Neuro: Denies headache, numbness, weakness, slurred speech, loss of memory or consciousness  Past Medical History  He,  has a past medical history of Colon polyp, GERD (gastroesophageal reflux disease), Hemorrhoid, Hyperplasia of prostate, Hypertension, Other and unspecified hyperlipidemia, Other testicular hypofunction, and Sinus bradycardia.   Surgical History    Past Surgical History:  Procedure Laterality Date  . APPENDECTOMY    . HAND SURGERY Right      Social History   reports that he has been smoking cigars. His smokeless tobacco use includes chew. He reports current alcohol use. He reports that he does not use drugs.   Family History   His family history includes Diabetes in his daughter and father; Heart attack in his mother; Stroke in his mother.   Allergies Allergies  Allergen Reactions  . Cialis [Tadalafil]   . Viagra [Sildenafil Citrate] Other (See Comments)    heartburn     Home Medications  Prior to Admission medications   Medication Sig Start Date End Date Taking? Authorizing Provider  albuterol (VENTOLIN HFA) 108 (90 Base) MCG/ACT inhaler Inhale 2 puffs into  the lungs every 6 (six) hours as needed for shortness of breath. 05/14/20  Yes [provider]  aspirin 81 MG tablet Take 81 mg by mouth daily.     Yes [provider]  benazepril (LOTENSIN) 10 MG tablet Take 1 tablet (10 mg total) by mouth daily. 04/12/20  Yes Dettinger, Fransisca Kaufmann, MD  Cholecalciferol (VITAMIN D-3) 5000 UNITS TABS Take 5,000 Units by mouth daily.    Yes [provider]  Garlic 4287 MG CAPS Take 2 capsules by mouth daily.   Yes [provider]  NON FORMULARY Take 1 tablet by mouth daily. Equate allergy tablet   Yes [provider]  Omega-3 Fatty Acids (FISH OIL) 1000 MG CAPS Take 3,000 capsules by mouth daily.    Yes [provider]  omeprazole (PRILOSEC) 20 MG capsule Take 1 capsule (20 mg total) by mouth daily. 04/12/20  Yes Dettinger, Fransisca Kaufmann, MD  pravastatin (PRAVACHOL) 40 MG tablet Take 1 tablet (40 mg total) by mouth daily. 04/12/20  Yes Dettinger, Fransisca Kaufmann, MD  vitamin C (ASCORBIC ACID) 500 MG tablet Take 500 mg by mouth daily.   Yes [provider]  amLODipine (NORVASC) 5 MG tablet Take 1 tablet (5 mg total) by mouth daily. Patient not taking: Reported on 05/31/2020 04/12/20 05/31/20  Dettinger, Fransisca Kaufmann, MD     Signature:  Johnsie Cancel, NP-C [facility] Contact / Pager information can be found on Amion  05/31/2020, 12:10 PM

## 2020-05-31 NOTE — Progress Notes (Signed)
ANTICOAGULATION CONSULT NOTE - Follow Up Consult  Pharmacy Consult for IV Heparin  Indication: pulmonary embolus  Allergies  Allergen Reactions  . Cialis [Tadalafil]   . Viagra [Sildenafil Citrate] Other (See Comments)    heartburn    Patient Measurements: Height: 5\' 6"  (167.6 cm) Weight: 109 kg (240 lb 4.8 oz) IBW/kg (Calculated) : 63.8 Heparin Dosing Weight: 89 kg   Vital Signs: Temp: 98.4 F (36.9 C) (11/03 1552) Temp Source: Oral (11/03 1552) BP: 138/83 (11/03 1552) Pulse Rate: 73 (11/03 1552)  Labs: Recent Labs    05/31/20 0553 05/31/20 0823 05/31/20 1757  HGB 14.1  --   --   HCT 40.8  --   --   PLT 177  --   --   HEPARINUNFRC  --   --  0.92*  CREATININE 0.92  --   --   TROPONINIHS 38* 36*  --     Estimated Creatinine Clearance: 80.4 mL/min (by C-G formula based on SCr of 0.92 mg/dL).   Medical History: Past Medical History:  Diagnosis Date  . Colon polyp   . GERD (gastroesophageal reflux disease)   . Hemorrhoid   . Hyperplasia of prostate   . Hypertension   . Other and unspecified hyperlipidemia   . Other testicular hypofunction   . Sinus bradycardia     Assessment: 74 yr old male with recent dx of COVID presented with shortness of breath, was found to have submassive bilateral pulmonary emboli with associated R heart strain. Pharmacy was consulted to start IV heparin.  Heparin level ~7 hrs after heparin 6000 units IV bolus X 1, followed by heparin infusion at 1900 units/hr was 0.92 units/ml, which is above the goal range for this pt. H/H, platelets WNL. Per RN, no issues with IV or bleeding observed.  Goal of Therapy:  Heparin level 0.3-0.7 units/ml Monitor platelets by anticoagulation protocol: Yes   Plan:  Decrease heparin infusion to 1600 units/hr Check heparin level in ~7 hrs Monitor daily heparin level, CBC Monitor for signs/symptoms of bleeding F/U transition to oral anticoagulant when able  Gillermina Hu, PharmD, BCPS,  [facility] Clinical Pharmacist

## 2020-06-01 ENCOUNTER — Inpatient Hospital Stay (HOSPITAL_COMMUNITY): Payer: Medicare HMO

## 2020-06-01 DIAGNOSIS — I2692 Saddle embolus of pulmonary artery without acute cor pulmonale: Secondary | ICD-10-CM | POA: Diagnosis not present

## 2020-06-01 LAB — BASIC METABOLIC PANEL
Anion gap: 9 (ref 5–15)
BUN: 6 mg/dL — ABNORMAL LOW (ref 8–23)
CO2: 27 mmol/L (ref 22–32)
Calcium: 8.7 mg/dL — ABNORMAL LOW (ref 8.9–10.3)
Chloride: 102 mmol/L (ref 98–111)
Creatinine, Ser: 1.02 mg/dL (ref 0.61–1.24)
GFR, Estimated: >60 mL/min (ref >60)
Glucose, Bld: 109 mg/dL — ABNORMAL HIGH (ref 70–99)
Potassium: 3.7 mmol/L (ref 3.5–5.1)
Sodium: 138 mmol/L (ref 135–145)

## 2020-06-01 LAB — CBC
HCT: 34.5 % — ABNORMAL LOW (ref 39.0–52.0)
Hemoglobin: 11.7 g/dL — ABNORMAL LOW (ref 13.0–17.0)
MCH: 33.2 pg (ref 26.0–34.0)
MCHC: 33.9 g/dL (ref 30.0–36.0)
MCV: 98 fL (ref 80.0–100.0)
Platelets: 140 10*3/uL — ABNORMAL LOW (ref 150–400)
RBC: 3.52 MIL/uL — ABNORMAL LOW (ref 4.22–5.81)
RDW: 13 % (ref 11.5–15.5)
WBC: 7.1 10*3/uL (ref 4.0–10.5)
nRBC: 0 % (ref 0.0–0.2)

## 2020-06-01 LAB — RESPIRATORY PANEL BY RT PCR (FLU A&B, COVID)
Influenza A by PCR: NEGATIVE
Influenza B by PCR: NEGATIVE
SARS Coronavirus 2 by RT PCR: NEGATIVE

## 2020-06-01 LAB — HEPARIN LEVEL (UNFRACTIONATED)
Heparin Unfractionated: 0.71 IU/mL — ABNORMAL HIGH (ref 0.30–0.70)
Heparin Unfractionated: 0.58 IU/mL (ref 0.30–0.70)

## 2020-06-01 NOTE — Progress Notes (Signed)
PROGRESS NOTE    [name] [name]  ZDG:[id number] DOB: 1946-07-19 DOA: 05/31/2020 PCP: Dettinger, Fransisca Kaufmann, MD   Chief Complaint  Patient presents with   Shortness of Breath  Brief Narrative: 74 year old male with CAD, hypertension, recent Covid infection 05/04/20, presents to the ED 11/3 with shortness of breath, was having exertional shortness of breath several weeks and worsening last few days. He was seen in the ED D-dimer 18.8, CT angios showed acute PE with right heart strain/submassive PE on the CT scan and pulmonary infarction bilaterally right more than left, slightly nodular configuration of some lesions in the right middle and right lower lobes and follow-up CT chest advised in 4 to 6 weeks. PCCM was consulted and patient was admitted.  Subjective: Alert awake no chest pain or shortness of breath.  Resting comfortably. He is on 2l [place] Hemodynamically stable.  Assessment & Plan:  Acute submassive PE in the setting of recent COVID-19 infection 05/04/2020.  Needing low flow nasal cannula but hemodynamically stable, PCCM consulted, echocardiogram study 60%, normal LV function, RV function is mildly reduced RV size is mildly enlarged.  Duplex obtained and shows DVT on the right.  On heparin drip plan is to convert to DOAC, ambulate him and see if he needs home oxygen.  Acute DVT right popliteal vein right posterior tibial vein and right peroneal vein continue anticoagulation as above.  Hypertension,essential: BP stable home meds on hold.  GERD-cont ppi.  Dyslipidemia-lovaza/statins.  Morbid obesity weight BMI 38.51 kg/m follow-up  Nutrition: Diet Order            Diet heart healthy/carb modified Room service appropriate? Yes; Fluid consistency: Thin  Diet effective now                 Body mass index is 38.51 kg/m.  DVT prophylaxis:  Code Status:   Code Status: DNR  Family Communication: plan of care discussed with patient at bedside.  Status is:  Inpatient  Remains inpatient appropriate because:IV treatments appropriate due to intensity of illness or inability to take PO and Inpatient level of care appropriate due to severity of illness   Dispo: The patient is from: Home              Anticipated d/c is to: Home              Anticipated d/c date is: 1 day              Patient currently is not medically stable to d/c.   Consultants:see note  Procedures:see note  Culture/Microbiology No results found for: SDES, SPECREQUEST, CULT, REPTSTATUS  Other culture-see note  Medications: Scheduled Meds:  vitamin C  500 mg Oral Daily   aspirin EC  81 mg Oral Daily   cholecalciferol  5,000 Units Oral Daily   omega-3 acid ethyl esters  1 g Oral Daily   pantoprazole  40 mg Oral Daily   pravastatin  40 mg Oral Daily   sodium chloride flush  3 mL Intravenous Q12H   Continuous Infusions:  sodium chloride     heparin 1,600 Units/hr (05/31/20 2213)    Antimicrobials: Anti-infectives (From admission, onward)   None     Objective: Vitals: Today's Vitals   06/01/20 0007 06/01/20 0435 06/01/20 0745 06/01/20 1100  BP: 135/82 138/75 135/88 125/72  Pulse: 60 70 (!) 59 64  Resp: 16 16 16 17   Temp: 98.6 F (37 C) 97.9 F (36.6 C) 98.7 F (37.1 C) 98.5 F (36.9 C)  TempSrc: Oral Oral Oral Oral  SpO2: 95% 95% 96% 95%  Weight:  108.2 kg    Height:      PainSc:        Intake/Output Summary (Last 24 hours) at 06/01/2020 1502 Last data filed at 06/01/2020 0622 Gross per 24 hour  Intake 479.33 ml  Output 200 ml  Net 279.33 ml   Filed Weights   05/31/20 0721 05/31/20 1552 06/01/20 0435  Weight: 112.5 kg 109 kg 108.2 kg   Weight change:   Intake/Output from previous day: 11/03 0701 - 11/04 0700 In: 479.3 [P.O.:100; I.V.:379.3] Out: 200 [Urine:200] Intake/Output this shift: No intake/output data recorded.  Examination: General exam: AAOx3, obese,NAD, weak appearing. HEENT:Oral mucosa moist, Ear/Nose WNL  grossly,dentition normal. Respiratory system: bilaterally clear,no wheezing or crackles,no use of accessory muscle, non tender. Cardiovascular system: S1 & S2 +, regular, No JVD. Gastrointestinal system: Abdomen soft, NT,ND, BS+. Nervous System:Alert, awake, moving extremities and grossly nonfocal Extremities: No edema, distal peripheral pulses palpable.  Skin: No rashes,no icterus. MSK: Normal muscle bulk,tone, power  Data Reviewed: I have personally reviewed following labs and imaging studies CBC: Recent Labs  Lab 05/31/20 0553 06/01/20 0201  WBC 7.0 7.1  HGB 14.1 11.7*  HCT 40.8 34.5*  MCV 98.8 98.0  PLT 177 557*   Basic Metabolic Panel: Recent Labs  Lab 05/31/20 0553 06/01/20 0201  NA 137 138  K 3.7 3.7  CL 99 102  CO2 25 27  GLUCOSE 134* 109*  BUN 6* 6*  CREATININE 0.92 1.02  CALCIUM 9.0 8.7*   GFR: Estimated Creatinine Clearance: 72.2 mL/min (by C-G formula based on SCr of 1.02 mg/dL). Liver Function Tests: No results for input(s): AST, ALT, ALKPHOS, BILITOT, PROT, ALBUMIN in the last 168 hours. No results for input(s): LIPASE, AMYLASE in the last 168 hours. No results for input(s): AMMONIA in the last 168 hours. Coagulation Profile: No results for input(s): INR, PROTIME in the last 168 hours. Cardiac Enzymes: No results for input(s): CKTOTAL, CKMB, CKMBINDEX, TROPONINI in the last 168 hours. BNP (last 3 results) No results for input(s): PROBNP in the last 8760 hours. HbA1C: No results for input(s): HGBA1C in the last 72 hours. CBG: No results for input(s): GLUCAP in the last 168 hours. Lipid Profile: No results for input(s): CHOL, HDL, LDLCALC, TRIG, CHOLHDL, LDLDIRECT in the last 72 hours. Thyroid Function Tests: No results for input(s): TSH, T4TOTAL, FREET4, T3FREE, THYROIDAB in the last 72 hours. Anemia Panel: No results for input(s): VITAMINB12, FOLATE, FERRITIN, TIBC, IRON, RETICCTPCT in the last 72 hours. Sepsis Labs: No results for input(s):  PROCALCITON, LATICACIDVEN in the last 168 hours.  Recent Results (from the past 240 hour(s))  Respiratory Panel by RT PCR (Flu A&B, Covid) - Nasopharyngeal Swab     Status: None   Collection Time: 06/01/20 11:50 AM   Specimen: Nasopharyngeal Swab  Result Value Ref Range Status   SARS Coronavirus 2 by RT PCR NEGATIVE NEGATIVE Final    Comment: (NOTE) SARS-CoV-2 target nucleic acids are NOT DETECTED.  The SARS-CoV-2 RNA is generally detectable in upper respiratoy specimens during the acute phase of infection. The lowest concentration of SARS-CoV-2 viral copies this assay can detect is 131 copies/mL. A negative result does not preclude SARS-Cov-2 infection and should not be used as the sole basis for treatment or other patient management decisions. A negative result may occur with  improper specimen collection/handling, submission of specimen other than nasopharyngeal swab, presence of viral mutation(s) within the areas targeted by  this assay, and inadequate number of viral copies (<131 copies/mL). A negative result must be combined with clinical observations, patient history, and epidemiological information. The expected result is Negative.  Fact Sheet for Patients:  PinkCheek.be  Fact Sheet for Healthcare Providers:  GravelBags.it  This test is no t yet approved or cleared by the [place] FDA and  has been authorized for detection and/or diagnosis of SARS-CoV-2 by FDA under an Emergency Use Authorization (EUA). This EUA will remain  in effect (meaning this test can be used) for the duration of the COVID-19 declaration under Section 564(b)(1) of the Act, 21 U.S.C. section 360bbb-3(b)(1), unless the authorization is terminated or revoked sooner.     Influenza A by PCR NEGATIVE NEGATIVE Final   Influenza B by PCR NEGATIVE NEGATIVE Final    Comment: (NOTE) The Xpert Xpress SARS-CoV-2/FLU/RSV assay is intended as an aid  in  the diagnosis of influenza from Nasopharyngeal swab specimens and  should not be used as a sole basis for treatment. Nasal washings and  aspirates are unacceptable for Xpert Xpress SARS-CoV-2/FLU/RSV  testing.  Fact Sheet for Patients: PinkCheek.be  Fact Sheet for Healthcare Providers: GravelBags.it  This test is not yet approved or cleared by the [place] FDA and  has been authorized for detection and/or diagnosis of SARS-CoV-2 by  FDA under an Emergency Use Authorization (EUA). This EUA will remain  in effect (meaning this test can be used) for the duration of the  Covid-19 declaration under Section 564(b)(1) of the Act, 21  U.S.C. section 360bbb-3(b)(1), unless the authorization is  terminated or revoked. Performed at [facility], [place] [address], [place], [place] [address]      Radiology Studies: DG Chest 2 View  Result Date: 05/31/2020 CLINICAL DATA:  Shortness of breath 3 weeks after COVID EXAM: CHEST - 2 VIEW COMPARISON:  03/07/2017 FINDINGS: Minimal patchy airspace opacity on both sides, peripheral appearing. Lung volumes are mildly low but stable. No effusion or pneumothorax. Normal heart size and stable mediastinal contours. IMPRESSION: Mild bilateral pulmonary infiltrate. Electronically Signed   By: Monte Fantasia M.D.   On: 05/31/2020 06:15   CT Angio Chest PE W and/or Wo Contrast  Result Date: 05/31/2020 CLINICAL DATA:  Elevated D-dimer, shortness of breath. EXAM: CT ANGIOGRAPHY CHEST WITH CONTRAST TECHNIQUE: Multidetector CT imaging of the chest was performed using the standard protocol during bolus administration of intravenous contrast. Multiplanar CT image reconstructions and MIPs were obtained to evaluate the vascular anatomy. CONTRAST:  75mL OMNIPAQUE IOHEXOL 350 MG/ML SOLN COMPARISON:  None. FINDINGS: Cardiovascular: There are filling defects in the pulmonary arteries, extending distally from  the main pulmonary arteries bilaterally. RV/LV ratio is 1.2. Atherosclerotic calcification of the aorta and coronary arteries. Heart is enlarged. No pericardial effusion. Mediastinum/Nodes: No pathologically enlarged mediastinal, hilar or axillary lymph nodes. Esophagus is unremarkable. Lungs/Pleura: Patchy areas of peripheral ground-glass are seen predominantly in the right upper, right middle and right lower lobes. Nodular consolidation in the lateral segment right middle lobe (6/76 and right lower lobe 6/78). Minimal ground-glass involvement of the left upper lobe. No pleural fluid. Airway is unremarkable. Upper Abdomen: Visualized portions of the liver, gallbladder, adrenal glands, kidneys, spleen, pancreas, stomach and bowel are unremarkable. No upper abdominal adenopathy. Musculoskeletal: Degenerative changes in the spine. No worrisome lytic or sclerotic lesions. Review of the MIP images confirms the above findings. IMPRESSION: 1. Positive for acute PE with CT evidence of right heart strain (RV/LV Ratio = 1.2) consistent with at least  submassive (intermediate risk) PE. The presence of right heart strain has been associated with an increased risk of morbidity and mortality. Critical Value/emergent results were called by telephone at the time of interpretation on 05/31/2020 at 10:29 am to provider [facility] , who verbally acknowledged these results. 2. Areas pulmonary infarction bilaterally, right greater than left. Given a slightly nodular configuration of some lesions in the right middle and right lower lobes, follow-up CT chest without contrast in 4-6 weeks could be performed to ensure resolution and exclude underlying malignancy, as clinically indicated. 3.  Aortic atherosclerosis (ICD10-I70.0). Electronically Signed   By: Lorin Picket M.D.   On: 05/31/2020 10:31   ECHOCARDIOGRAM COMPLETE  Result Date: 05/31/2020    ECHOCARDIOGRAM REPORT   Patient Name:   [name] [name] Date of Exam: 05/31/2020  Medical Rec #:  [id number]       Height:       66.0 in Accession #:    [id number]      Weight:       248.0 lb Date of Birth:  Dec 05, 1945      BSA:          2.191 m Patient Age:    56 years        BP:           122/61 mmHg Patient Gender: M               HR:           70 bpm. Exam Location:  Inpatient Procedure: 2D Echo, Cardiac Doppler, Color Doppler and Intracardiac            Opacification Agent Indications:    Chest pain  History:        Patient has prior history of Echocardiogram examinations, most                 recent 02/09/2016. Risk Factors:Hypertension and Dyslipidemia.                 GERD.  Sonographer:    Clayton Lefort RDCS (AE) Referring Phys: [id number] [facility] AGBATA  Sonographer Comments: Suboptimal subcostal window and suboptimal apical window. IMPRESSIONS  1. Left ventricular ejection fraction, by estimation, is 60 to 65%. The left ventricle has normal function. The left ventricle has no regional wall motion abnormalities. There is moderate left ventricular hypertrophy. Left ventricular diastolic parameters are indeterminate.  2. Right ventricular systolic function is mildly reduced. The right ventricular size is mildly enlarged.  3. The mitral valve is normal in structure. No evidence of mitral valve regurgitation. No evidence of mitral stenosis.  4. The aortic valve is abnormal. There is mild calcification of the aortic valve. Aortic valve regurgitation is not visualized. No aortic stenosis is present. FINDINGS  Left Ventricle: Left ventricular ejection fraction, by estimation, is 60 to 65%. The left ventricle has normal function. The left ventricle has no regional wall motion abnormalities. Definity contrast agent was given IV to delineate the left ventricular  endocardial borders. The left ventricular internal cavity size was normal in size. There is moderate left ventricular hypertrophy. Left ventricular diastolic parameters are indeterminate. Right Ventricle: The right ventricular size is mildly  enlarged. Right vetricular wall thickness was not well visualized. Right ventricular systolic function is mildly reduced. Left Atrium: Left atrial size was normal in size. Right Atrium: Right atrial size was not well visualized. Pericardium: There is no evidence of pericardial effusion. Presence of pericardial fat pad. Mitral Valve: The mitral valve is  normal in structure. No evidence of mitral valve regurgitation. No evidence of mitral valve stenosis. Tricuspid Valve: The tricuspid valve is not well visualized. Tricuspid valve regurgitation is trivial. No evidence of tricuspid stenosis. Aortic Valve: The aortic valve is abnormal. There is mild calcification of the aortic valve. Aortic valve regurgitation is not visualized. No aortic stenosis is present. Aortic valve mean gradient measures 3.0 mmHg. Aortic valve peak gradient measures 4.8 mmHg. Aortic valve area, by VTI measures 2.87 cm. Pulmonic Valve: The pulmonic valve was not well visualized. Pulmonic valve regurgitation is not visualized. No evidence of pulmonic stenosis. Aorta: The aortic root is normal in size and structure. Venous: The inferior vena cava was not well visualized. IAS/Shunts: The interatrial septum was not well visualized.  LEFT VENTRICLE PLAX 2D LVIDd:         3.80 cm  Diastology LVIDs:         2.50 cm  LV e' medial:    5.77 cm/s LV PW:         1.60 cm  LV E/e' medial:  10.0 LV IVS:        1.50 cm  LV e' lateral:   5.11 cm/s LVOT diam:     2.10 cm  LV E/e' lateral: 11.3 LV SV:         67 LV SV Index:   31 LVOT Area:     3.46 cm  RIGHT VENTRICLE RV S prime:     18.70 cm/s TAPSE (M-mode): 3.9 cm LEFT ATRIUM           Index LA diam:      3.00 cm 1.37 cm/m LA Vol (A4C): 34.6 ml 15.79 ml/m  AORTIC VALVE AV Area (Vmax):    3.03 cm AV Area (Vmean):   2.79 cm AV Area (VTI):     2.87 cm AV Vmax:           109.00 cm/s AV Vmean:          85.900 cm/s AV VTI:            0.234 m AV Peak Grad:      4.8 mmHg AV Mean Grad:      3.0 mmHg LVOT Vmax:          95.30 cm/s LVOT Vmean:        69.300 cm/s LVOT VTI:          0.194 m LVOT/AV VTI ratio: 0.83  AORTA Ao Root diam: 3.40 cm Ao Asc diam:  3.50 cm MITRAL VALVE               TRICUSPID VALVE MV Area (PHT): 2.20 cm    TR Peak grad:   37.2 mmHg MV Decel Time: 345 msec    TR Vmax:        305.00 cm/s MV E velocity: 57.50 cm/s MV A velocity: 56.00 cm/s  SHUNTS MV E/A ratio:  1.03        Systemic VTI:  0.19 m                            Systemic Diam: 2.10 cm Cherlynn Kaiser MD Electronically signed by Cherlynn Kaiser MD Signature Date/Time: 05/31/2020/10:45:51 PM    Final    VAS [place] LOWER EXTREMITY VENOUS (DVT)  Result Date: 06/01/2020  Lower Venous DVT Study Indications: Pulmonary embolism.  Comparison Study: No prior study on file Performing Technologist: Sharion Dove RVS  Examination Guidelines: A complete  evaluation includes B-mode imaging, spectral Doppler, color Doppler, and power Doppler as needed of all accessible portions of each vessel. Bilateral testing is considered an integral part of a complete examination. Limited examinations for reoccurring indications may be performed as noted. The reflux portion of the exam is performed with the patient in reverse Trendelenburg.  +---------+---------------+---------+-----------+----------+--------------+  RIGHT     Compressibility Phasicity Spontaneity Properties Thrombus Aging  +---------+---------------+---------+-----------+----------+--------------+  CFV       Full            Yes       Yes                                    +---------+---------------+---------+-----------+----------+--------------+  SFJ       Full                                                             +---------+---------------+---------+-----------+----------+--------------+  FV Prox   Full                                                             +---------+---------------+---------+-----------+----------+--------------+  FV Mid    Full                                                              +---------+---------------+---------+-----------+----------+--------------+  FV Distal Full                                                             +---------+---------------+---------+-----------+----------+--------------+  PFV       Full                                                             +---------+---------------+---------+-----------+----------+--------------+  POP       None            No        No                     Acute           +---------+---------------+---------+-----------+----------+--------------+  PTV       None                                             Acute           +---------+---------------+---------+-----------+----------+--------------+  PERO      None                                             Acute           +---------+---------------+---------+-----------+----------+--------------+   +---------+---------------+---------+-----------+----------+--------------+  LEFT      Compressibility Phasicity Spontaneity Properties Thrombus Aging  +---------+---------------+---------+-----------+----------+--------------+  CFV       Full                                                             +---------+---------------+---------+-----------+----------+--------------+  SFJ       Full                                                             +---------+---------------+---------+-----------+----------+--------------+  FV Prox   Full                                                             +---------+---------------+---------+-----------+----------+--------------+  FV Mid    Full                                                             +---------+---------------+---------+-----------+----------+--------------+  FV Distal Full                                                             +---------+---------------+---------+-----------+----------+--------------+  PFV       Full                                                              +---------+---------------+---------+-----------+----------+--------------+  POP       None            No        No                     Acute           +---------+---------------+---------+-----------+----------+--------------+  PTV       None  Acute           +---------+---------------+---------+-----------+----------+--------------+  PERO      None                                             Acute           +---------+---------------+---------+-----------+----------+--------------+     Summary: RIGHT: - Findings consistent with acute deep vein thrombosis involving the right popliteal vein, right posterior tibial veins, and right peroneal veins.  LEFT: - Findings consistent with acute deep vein thrombosis involving the left popliteal vein, left posterior tibial veins, and left peroneal veins.  *See table(s) above for measurements and observations.    Preliminary      LOS: 1 day   Antonieta Pert, MD [facility]  06/01/2020, 3:02 PM

## 2020-06-01 NOTE — Consult Note (Signed)
NAME:  [name] [name], MRN:  [id number], DOB:  03-23-1946, LOS: 1 ADMISSION DATE:  05/31/2020, CONSULTATION DATE:  05/31/2020 REFERRING MD:  Dr. Francine Graven , CHIEF COMPLAINT: Pulmonary embolism   Brief History   74 year old male with recent diagnosis of Covid at 05/04/2020 who presented with new complaints of shortness of breath.  Evaluation on admission revealed a submassive PE with CT evidence of right heart strain.  PCCM consulted for further pulmonary consultation regarding possible interventions for PE  History of present illness   [name] [name] is a 74 year old male with a past medical history significant for recent COVID-19 05/04/2020, GERD, hypertension, sinus bradycardia, and colon polyps who presented to the emergency department with complaints of shortness of breath.  Patient is 3 weeks post Covid and began having recurrent shortness of breath and hypoxia at home prompting presentation to emergency department.  Of note patient's wife was also transported to the emergency department for chest pain and shortness of breath as well.  He denies any other additional complaints including no fever, chills, chest pain, lower extremity swelling or abdominal discomfort.  Patient and wife denies any current tobacco abuse, patient does report remote history of occasional cigar use with last use approximately 10 years ago  On admission vital signs within normal limits.  Presenting blood pressure was 111/71 with drop of 93/65.  On chart review patient's most recent outpatient visit to primary care was 04/12/2020 at that time blood pressure was 137/74.  Lab work on admission significant for mildly elevated troponin of 38  D-dimer 18.37.  CT angio chest obtained and revealed acute PE with at least submassive presentation given CT evidence of right heart strain.  Patient was admitted under hospitalist services and PCCM was consulted for further management submassive PE.  Past Medical History  Recent  Covid Hypertension GERD Sinus bradycardia Colon polyps  Significant Hospital Events   Admitted 11/3 for PE Heparin drip started  Consults:  Pulmonary critical care  Procedures:  None  Significant Diagnostic Tests:  CT angiogram chest/3 > acute PE with CT evidence of right heart strain (RV?LV ratio = 1.2) areas of pulmonary infarction bilaterally right greater than left  11/3 TTE >>  1. Left ventricular ejection fraction, by estimation, is 60 to 65%. The  left ventricle has normal function. The left ventricle has no regional  wall motion abnormalities. There is moderate left ventricular hypertrophy.  Left ventricular diastolic  parameters are indeterminate.  2. Right ventricular systolic function is mildly reduced. The right  ventricular size is mildly enlarged.  3. The mitral valve is normal in structure. No evidence of mitral valve  regurgitation. No evidence of mitral stenosis.  4. The aortic valve is abnormal. There is mild calcification of the  aortic valve. Aortic valve regurgitation is not visualized. No aortic  stenosis is present.   LE venous dopplers 11/4 >> PRELIMINARY RIGHT:  - Findings consistent with acute deep vein thrombosis involving the right  popliteal vein, right posterior tibial veins, and right peroneal veins.  LEFT:  - Findings consistent with acute deep vein thrombosis involving the left  popliteal vein, left posterior tibial veins, and left peroneal veins.    Micro Data:   Antimicrobials:  n/a  Interim history/subjective:  No complaints from patient- no complaints of CP, SOB, dizziness Remains on 2L [place]  Wife at bedside.   Objective   Blood pressure 135/88, pulse (!) 59, temperature 98.7 F (37.1 C), temperature source Oral, resp. rate 16, height 5\' 6"  (1.676  m), weight 108.2 kg, SpO2 96 %.        Intake/Output Summary (Last 24 hours) at 06/01/2020 0810 Last data filed at 06/01/2020 0932 Gross per 24 hour  Intake 479.33 ml  Output  200 ml  Net 279.33 ml   Filed Weights   05/31/20 0721 05/31/20 1552 06/01/20 0435  Weight: 112.5 kg 109 kg 108.2 kg   Examination: General:  Elderly male sitting upright in bed in NAD HEENT: MM pink/moist Neuro: Neuro intact, stoic, MAE CV: rrrr, no murmur PULM:  Non labored, CTAB, diminished in bases GI: obese, soft, +bs  Extremities: warm/dry,+1 LE edema, R >L  Skin: no rashes  Resolved Hospital Problem list    Assessment & Plan:  Submassive PE -Acute PE with CT evidence of right heart strain (RV?LV ratio = 1.2) areas of pulmonary infarction bilaterally right greater than left, in the setting of post covid -Low risk on PESI P: - BP remains stable, slightly bradycardic overnight, asymptomatic, possibly while resting, currently back NSR.   No increased O2 requirements.   - continue to wean FiO2  - transition from heparin gtt to [place] per primary team, anticoagulation for 3 months - Trop hs trend flat; BNP normal  - TTE showed LVEF 60-65%; moderate LVH, RV systolic function mildly reduced with mildly enlarged RV, no regional wall abnormalities  - LE dopplers positive for BLE DVTs - will schedule pulmonary outpatient follow-up   Nothing further to add.  PCCM will sign off.  Please do not hesitate to call us back if we can be of any further assistance.  Best practice:  Diet: Heart heathy  Pain/Anxiety/Delirium protocol (if indicated): PRN VAP protocol (if indicated): N/A DVT prophylaxis: Heparin drip  GI prophylaxis: PPI Glucose control: Monitor  Mobility: Up with assistance after LE dopplers  Code Status: Full Family Communication: Updated at bedside Disposition: telemetry   Labs   CBC: Recent Labs  Lab 05/31/20 0553 06/01/20 0201  WBC 7.0 7.1  HGB 14.1 11.7*  HCT 40.8 34.5*  MCV 98.8 98.0  PLT 177 140*    Basic Metabolic Panel: Recent Labs  Lab 05/31/20 0553 06/01/20 0201  NA 137 138  K 3.7 3.7  CL 99 102  CO2 25 27  GLUCOSE 134* 109*  BUN 6* 6*   CREATININE 0.92 1.02  CALCIUM 9.0 8.7*   GFR: Estimated Creatinine Clearance: 72.2 mL/min (by C-G formula based on SCr of 1.02 mg/dL). Recent Labs  Lab 05/31/20 0553 06/01/20 0201  WBC 7.0 7.1    Liver Function Tests: No results for input(s): AST, ALT, ALKPHOS, BILITOT, PROT, ALBUMIN in the last 168 hours. No results for input(s): LIPASE, AMYLASE in the last 168 hours. No results for input(s): AMMONIA in the last 168 hours.  ABG No results found for: PHART, PCO2ART, PO2ART, HCO3, TCO2, ACIDBASEDEF, O2SAT   Coagulation Profile: No results for input(s): INR, PROTIME in the last 168 hours.  Cardiac Enzymes: No results for input(s): CKTOTAL, CKMB, CKMBINDEX, TROPONINI in the last 168 hours.  HbA1C: Hemoglobin A1C  Date/Time Value Ref Range Status  10/11/2013 08:55 AM 5.6%  Final    Comment:    normal range 4.2-6.3%    CBG: No results for input(s): GLUCAP in the last 168 hours.    Kennieth Rad, ACNP [facility] 06/01/2020, 8:10 AM

## 2020-06-01 NOTE — Progress Notes (Signed)
VASCULAR LAB    Bilateral lower extremity venous duplex has been performed.  See CV proc for preliminary results.   Kamaria Lucia, RVT 06/01/2020, 10:01 AM

## 2020-06-01 NOTE — Progress Notes (Signed)
ANTICOAGULATION CONSULT NOTE  Pharmacy Consult for Heparin  Indication: pulmonary embolus  Allergies  Allergen Reactions   Cialis [Tadalafil]    Viagra [Sildenafil Citrate] Other (See Comments)    heartburn    Patient Measurements: Height: 5\' 6"  (167.6 cm) Weight: 109 kg (240 lb 4.8 oz) IBW/kg (Calculated) : 63.8 Heparin Dosing Weight: 89 kg   Vital Signs: Temp: 98.6 F (37 C) (11/04 0007) Temp Source: Oral (11/04 0007) BP: 135/82 (11/04 0007) Pulse Rate: 60 (11/04 0007)  Labs: Recent Labs    05/31/20 0553 05/31/20 0823 05/31/20 1757 06/01/20 0201  HGB 14.1  --   --  11.7*  HCT 40.8  --   --  34.5*  PLT 177  --   --  140*  HEPARINUNFRC  --   --  0.92* 0.71*  CREATININE 0.92  --   --  1.02  TROPONINIHS 38* 36*  --   --     Estimated Creatinine Clearance: 72.5 mL/min (by C-G formula based on SCr of 1.02 mg/dL).  Assessment: 74 y.o. male with PE for heparin  Goal of Therapy:  Heparin level 0.3-0.7 units/ml Monitor platelets by anticoagulation protocol: Yes   Plan:  Continue Heparin at current rate for now Recheck level in 6 hours to verify within goal range  Phillis Knack, PharmD, BCPS

## 2020-06-01 NOTE — Plan of Care (Signed)

## 2020-06-01 NOTE — Progress Notes (Signed)
[facility] for Heparin  Indication: pulmonary embolus  Allergies  Allergen Reactions  . Cialis [Tadalafil]   . Viagra [Sildenafil Citrate] Other (See Comments)    heartburn    Patient Measurements: Height: 5\' 6"  (167.6 cm) Weight: 108.2 kg (238 lb 9.6 oz) IBW/kg (Calculated) : 63.8 Heparin Dosing Weight: 89 kg   Vital Signs: Temp: 98.5 F (36.9 C) (11/04 1100) Temp Source: Oral (11/04 1100) BP: 125/72 (11/04 1100) Pulse Rate: 64 (11/04 1100)  Labs: Recent Labs    05/31/20 0553 05/31/20 0823 05/31/20 1757 06/01/20 0201 06/01/20 1100  HGB 14.1  --   --  11.7*  --   HCT 40.8  --   --  34.5*  --   PLT 177  --   --  140*  --   HEPARINUNFRC  --   --  0.92* 0.71* 0.58  CREATININE 0.92  --   --  1.02  --   TROPONINIHS 38* 36*  --   --   --     Estimated Creatinine Clearance: 72.2 mL/min (by C-G formula based on SCr of 1.02 mg/dL).  Assessment: 74 y.o. male with PE for heparin.  Heparin level this morning at goal 0.58. No bleeding issues noted. Hgb down from admit 14>11.7, will follow closely.   Goal of Therapy:  Heparin level 0.3-0.7 units/ml Monitor platelets by anticoagulation protocol: Yes   Plan:  Continue heparin at 1600 units/hr Daily heparin level and cbc

## 2020-06-02 ENCOUNTER — Other Ambulatory Visit (HOSPITAL_COMMUNITY): Payer: Self-pay | Admitting: Internal Medicine

## 2020-06-02 DIAGNOSIS — I2692 Saddle embolus of pulmonary artery without acute cor pulmonale: Secondary | ICD-10-CM | POA: Diagnosis not present

## 2020-06-02 LAB — CBC
HCT: 34.3 % — ABNORMAL LOW (ref 39.0–52.0)
Hemoglobin: 11.6 g/dL — ABNORMAL LOW (ref 13.0–17.0)
MCH: 32.9 pg (ref 26.0–34.0)
MCHC: 33.8 g/dL (ref 30.0–36.0)
MCV: 97.2 fL (ref 80.0–100.0)
Platelets: 150 10*3/uL (ref 150–400)
RBC: 3.53 MIL/uL — ABNORMAL LOW (ref 4.22–5.81)
RDW: 12.7 % (ref 11.5–15.5)
WBC: 6.3 10*3/uL (ref 4.0–10.5)
nRBC: 0 % (ref 0.0–0.2)

## 2020-06-02 LAB — HEPARIN LEVEL (UNFRACTIONATED): Heparin Unfractionated: 0.63 IU/mL (ref 0.30–0.70)

## 2020-06-02 MED ORDER — APIXABAN 5 MG PO TABS: 5.0000 mg | ORAL_TABLET | Freq: Two times a day (BID) | ORAL | 0 refills | Status: DC

## 2020-06-02 MED ORDER — FISH OIL 1000 MG PO CAPS
3000.0000 mg | ORAL_CAPSULE | Freq: Every day | ORAL | 0 refills | Status: DC
Start: 2020-06-02 — End: 2021-02-15

## 2020-06-02 MED ORDER — APIXABAN 5 MG PO TABS: 5.0000 mg | ORAL_TABLET | Freq: Two times a day (BID) | ORAL | Status: DC

## 2020-06-02 MED ORDER — APIXABAN 5 MG PO TABS: 10.0000 mg | ORAL_TABLET | Freq: Two times a day (BID) | ORAL | 0 refills | Status: DC

## 2020-06-02 MED ORDER — APIXABAN 5 MG PO TABS
10.0000 mg | ORAL_TABLET | Freq: Two times a day (BID) | ORAL | Status: DC
  Administered 2020-06-02: 10 mg via ORAL
  Filled 2020-06-02: qty 2

## 2020-06-02 MED FILL — ELIQUIS STARTER PACK 5 MG T: 5 | 30 days supply | Qty: 74 | Fill #0

## 2020-06-02 NOTE — Discharge Instructions (Signed)
Information on my medicine - ELIQUIS (apixaban)   Why was Eliquis prescribed for you? Eliquis was prescribed to treat blood clots that may have been found in the veins of your legs (deep vein thrombosis) or in your lungs (pulmonary embolism) and to reduce the risk of them occurring again.  What do You need to know about Eliquis ? The starting dose is 10 mg (two 5 mg tablets) taken TWICE daily for the FIRST SEVEN (7) DAYS, then on 06/09/20  the dose is reduced to ONE 5 mg tablet taken TWICE daily.  Eliquis may be taken with or without food.   Try to take the dose about the same time in the morning and in the evening. If you have difficulty swallowing the tablet whole please discuss with your pharmacist how to take the medication safely.  Take Eliquis exactly as prescribed and DO NOT stop taking Eliquis without talking to the doctor who prescribed the medication.  Stopping may increase your risk of developing a new blood clot.  Refill your prescription before you run out.  After discharge, you should have regular check-up appointments with your healthcare provider that is prescribing your Eliquis.    What do you do if you miss a dose? If a dose of ELIQUIS is not taken at the scheduled time, take it as soon as possible on the same day and twice-daily administration should be resumed. The dose should not be doubled to make up for a missed dose.  Important Safety Information A possible side effect of Eliquis is bleeding. You should call your healthcare provider right away if you experience any of the following: ? Bleeding from an injury or your nose that does not stop. ? Unusual colored urine (red or dark brown) or unusual colored stools (red or black). ? Unusual bruising for unknown reasons. ? A serious fall or if you hit your head (even if there is no bleeding).  Some medicines may interact with Eliquis and might increase your risk of bleeding or clotting while on Eliquis. To help  avoid this, consult your healthcare provider or pharmacist prior to using any new prescription or non-prescription medications, including herbals, vitamins, non-steroidal anti-inflammatory drugs (NSAIDs) and supplements.  This website has more information on Eliquis (apixaban): http://www.eliquis.com/eliquis/home

## 2020-06-02 NOTE — TOC Transition Note (Signed)
Transition of Care [facility]) - CM/SW Discharge Note   Patient Details  Name: [name] [name] MRN: [id number] Date of Birth: [birth year]  Transition of Care [facility] CM/SW Contact:  Bartholomew Crews, RN Phone Number: [phone] 06/02/2020, 2:38 PM   Clinical Narrative:     Notified by nursing of patient readiness to transition home. DME oxygen order and pulm note written. Spoke with patient on the phone who put his daughter on the phone. Discussed need for oxygen and offered choice of agency. Referral sent to [facility] for delivery of oxygen to the room. Family to provide transport home. DC meds filled by [facility] pharmacy and delivered to the room. No further TOC needs identified at this time.   Final next level of care: Home/Self Care Barriers to Discharge: No Barriers Identified   Patient Goals and CMS Choice Patient states their goals for this hospitalization and ongoing recovery are:: home CMS Medicare.gov Compare Post Acute Care list provided to:: Patient Choice offered to / list presented to : Patient  Discharge Placement                       Discharge Plan and Services                DME Arranged: Oxygen DME Agency: [facility] Date DME Agency Contacted: 06/02/20 Time DME Agency Contacted: [phone]   HH Arranged: NA HH Agency: NA        Social Determinants of Health (SDOH) Interventions     Readmission Risk Interventions No flowsheet data found.

## 2020-06-02 NOTE — Discharge Summary (Signed)
Physician Discharge Summary  [name] [name] PXT:[id number] DOB: [birth year] DOA: 05/31/2020  PCP: Dettinger, Fransisca Kaufmann, MD  Admit date: 05/31/2020 Discharge date: 06/02/2020  Admitted From: home Disposition:  home  Recommendations for Outpatient Follow-up:  1. Follow up with PCP in 1-2 weeks 2. Please obtain BMP/CBC in one week 3. Please follow up on the following pending results:  Home Health:none  Equipment/Devices: none  Discharge Condition: Stable Code Status:   Code Status: Full Code Diet recommendation:  Diet Order            Diet - low sodium heart healthy           Diet heart healthy/carb modified Room service appropriate? Yes; Fluid consistency: Thin  Diet effective now                  Brief/Interim Summary: 74 year old male with CAD, hypertension, recent Covid infection 05/04/20, presents to the ED 11/3 with shortness of breath, was having exertional shortness of breath several weeks and worsening last few days. He was seen in the ED D-dimer 18.8, CT angios showed acute PE with right heart strain/submassive PE on the CT scan and pulmonary infarction bilaterally right more than left, slightly nodular configuration of some lesions in the right middle and right lower lobes and follow-up CT chest advised in 4 to 6 weeks. PCCM was consulted and patient was admitted Patient was treated with heparin, on echocardiogram with normal EF, mildly reduced RV function and RV sizes mildly enlarged, duplex showed DVT on the right.  Patient was subsequently transitioned to Eliquis after discussing risk benefits and alternatives.  Tolerating well did work with PT needing oxygen with ambulation, doing well on room air.  Is eager to go home/wants to home today.  He is being discharged with his daughter.   Discharge Diagnoses:  Acute submassive PE in the setting of recent COVID-19 infection 05/04/2020.  Needing oxygen while with ambulation, otherwise medically hemodynamically  stable.echocardiogram study 60% EF, normal LV function, RV function is mildly reduced, RV size is mildly enlarged.  Duplex obtained and shows DVT on the right.    Now on Eliquis  At least edness for 3 months.  He will follow-up with pulmonary as outpatient.  Acute DVT right popliteal vein right posterior tibial vein and right peroneal vein continue anticoagulation as above.  Hypertension,essential: BP stable continue home meds, check BP at home  GERD-cont ppi.  Dyslipidemia-lovaza/statins.  Morbid obesity weight BMI 38.51 kg/m follow-up, will benefit with weight loss.  Consults:  PCCM  Subjective: Alert awake oriented, no dyspnea chest pain.  Did work with PT today.  Oxygen as tolerated nasal cannula with ambulation doing well on room air at rest.  Eager to go home today.  Discharge Exam: Vitals:   06/02/20 0624 06/02/20 1130  BP: 132/78 127/78  Pulse: 61 60  Resp: 18 16  Temp: 98.1 F (36.7 C) 98.4 F (36.9 C)  SpO2: 97% 95%   General: Pt is alert, awake, not in acute distress Cardiovascular: RRR, S1/S2 +, no rubs, no gallops Respiratory: CTA bilaterally, no wheezing, no rhonchi Abdominal: Soft, NT, ND, bowel sounds + Extremities: no edema, no cyanosis  Discharge Instructions  Discharge Instructions    Diet - low sodium heart healthy   Complete by: As directed    Discharge instructions   Complete by: As directed    Follow-up with pulmonary doctor, primary care doctor.  Please call call MD or return to ER for similar or worsening recurring problem  that brought you to hospital or if any fever,nausea/vomiting,abdominal pain, uncontrolled pain, chest pain,  shortness of breath or any other alarming symptoms.  Please follow-up your doctor as instructed in a week time and call the office for appointment.  Please avoid alcohol, smoking, or any other illicit substance and maintain healthy habits including taking your regular medications as prescribed.  You were cared  for by a hospitalist during your hospital stay. If you have any questions about your discharge medications or the care you received while you were in the hospital after you are discharged, you can call the unit and ask to speak with the hospitalist on call if the hospitalist that took care of you is not available.  Once you are discharged, your primary care physician will handle any further medical issues. Please note that NO REFILLS for any discharge medications will be authorized once you are discharged, as it is imperative that you return to your primary care physician (or establish a relationship with a primary care physician if you do not have one) for your aftercare needs so that they can reassess your need for medications and monitor your lab values   Increase activity slowly   Complete by: As directed      Allergies as of 06/02/2020      Reactions   Cialis [tadalafil]    Viagra [sildenafil Citrate] Other (See Comments)   heartburn      Medication List    STOP taking these medications   amLODipine 5 MG tablet Commonly known as: NORVASC   lactulose 10 GM/15ML solution Commonly known as: CHRONULAC     TAKE these medications   albuterol 108 (90 Base) MCG/ACT inhaler Commonly known as: VENTOLIN HFA Inhale 2 puffs into the lungs every 6 (six) hours as needed for shortness of breath.   apixaban 5 MG Tabs tablet Commonly known as: ELIQUIS Take 2 tablets (10 mg total) by mouth 2 (two) times daily for 7 days.   apixaban 5 MG Tabs tablet Commonly known as: ELIQUIS Take 1 tablet (5 mg total) by mouth 2 (two) times daily. Start after finishing 1 wk of 10 mg bid. Start taking on: June 09, 2020   aspirin 81 MG tablet Take 81 mg by mouth daily.   benazepril 10 MG tablet Commonly known as: LOTENSIN Take 1 tablet (10 mg total) by mouth daily.   Fish Oil 1000 MG Caps Take 3 capsules (3,000 mg total) by mouth daily. What changed: how much to take   Garlic 0962 MG Caps Take 2  capsules by mouth daily.   NON FORMULARY Take 1 tablet by mouth daily. Equate allergy tablet   omeprazole 20 MG capsule Commonly known as: PRILOSEC Take 1 capsule (20 mg total) by mouth daily.   pravastatin 40 MG tablet Commonly known as: PRAVACHOL Take 1 tablet (40 mg total) by mouth daily.   vitamin C 500 MG tablet Commonly known as: ASCORBIC ACID Take 500 mg by mouth daily.   Vitamin D-3 125 MCG (5000 UT) Tabs Take 5,000 Units by mouth daily.            Durable Medical Equipment  (From admission, onward)         Start     Ordered   06/02/20 1314  For home use only DME oxygen  Once       Comments: 2 l [place] on ambulation  Question Answer Comment  Length of Need Lifetime   Mode or (Route) Nasal cannula  Liters per Minute 2   Frequency Continuous (stationary and portable oxygen unit needed)   Oxygen delivery system Other see comments      06/02/20 1313          Follow-up Information    Margaretha Seeds, MD. Call in 1 month(s).   Specialty: Pulmonary Disease Why: Will need to followup with Dr. Loanne Drilling in 3 months.  Our office will call you to schedule an appointment, however if you do not hear from our office by mid December, please call our office.  Contact information: [place] [address] [phone]        Dettinger, Fransisca Kaufmann, MD Follow up in 1 week(s).   Specialties: Family Medicine, Cardiology Contact information: [address] [place] [address] [phone]              Allergies  Allergen Reactions  . Cialis [Tadalafil]   . Viagra [Sildenafil Citrate] Other (See Comments)    heartburn    The results of significant diagnostics from this hospitalization (including imaging, microbiology, ancillary and laboratory) are listed below for reference.    Microbiology: Recent Results (from the past 240 hour(s))  Respiratory Panel by RT PCR (Flu A&B, Covid) - Nasopharyngeal Swab     Status: None   Collection  Time: 06/01/20 11:50 AM   Specimen: Nasopharyngeal Swab  Result Value Ref Range Status   SARS Coronavirus 2 by RT PCR NEGATIVE NEGATIVE Final    Comment: (NOTE) SARS-CoV-2 target nucleic acids are NOT DETECTED.  The SARS-CoV-2 RNA is generally detectable in upper respiratoy specimens during the acute phase of infection. The lowest concentration of SARS-CoV-2 viral copies this assay can detect is 131 copies/mL. A negative result does not preclude SARS-Cov-2 infection and should not be used as the sole basis for treatment or other patient management decisions. A negative result may occur with  improper specimen collection/handling, submission of specimen other than nasopharyngeal swab, presence of viral mutation(s) within the areas targeted by this assay, and inadequate number of viral copies (<131 copies/mL). A negative result must be combined with clinical observations, patient history, and epidemiological information. The expected result is Negative.  Fact Sheet for Patients:  PinkCheek.be  Fact Sheet for Healthcare Providers:  GravelBags.it  This test is no t yet approved or cleared by the [place] FDA and  has been authorized for detection and/or diagnosis of SARS-CoV-2 by FDA under an Emergency Use Authorization (EUA). This EUA will remain  in effect (meaning this test can be used) for the duration of the COVID-19 declaration under Section 564(b)(1) of the Act, 21 U.S.C. section 360bbb-3(b)(1), unless the authorization is terminated or revoked sooner.     Influenza A by PCR NEGATIVE NEGATIVE Final   Influenza B by PCR NEGATIVE NEGATIVE Final    Comment: (NOTE) The Xpert Xpress SARS-CoV-2/FLU/RSV assay is intended as an aid in  the diagnosis of influenza from Nasopharyngeal swab specimens and  should not be used as a sole basis for treatment. Nasal washings and  aspirates are unacceptable for Xpert Xpress  SARS-CoV-2/FLU/RSV  testing.  Fact Sheet for Patients: PinkCheek.be  Fact Sheet for Healthcare Providers: GravelBags.it  This test is not yet approved or cleared by the [place] FDA and  has been authorized for detection and/or diagnosis of SARS-CoV-2 by  FDA under an Emergency Use Authorization (EUA). This EUA will remain  in effect (meaning this test can be used) for the duration of the  Covid-19  declaration under Section 564(b)(1) of the Act, 21  U.S.C. section 360bbb-3(b)(1), unless the authorization is  terminated or revoked. Performed at [facility], [place] [address] [place], [place] [address]     Procedures/Studies: DG Chest 2 View  Result Date: 05/31/2020 CLINICAL DATA:  Shortness of breath 3 weeks after COVID EXAM: CHEST - 2 VIEW COMPARISON:  03/07/2017 FINDINGS: Minimal patchy airspace opacity on both sides, peripheral appearing. Lung volumes are mildly low but stable. No effusion or pneumothorax. Normal heart size and stable mediastinal contours. IMPRESSION: Mild bilateral pulmonary infiltrate. Electronically Signed   By: Monte Fantasia M.D.   On: 05/31/2020 06:15   CT Angio Chest PE W and/or Wo Contrast  Result Date: 05/31/2020 CLINICAL DATA:  Elevated D-dimer, shortness of breath. EXAM: CT ANGIOGRAPHY CHEST WITH CONTRAST TECHNIQUE: Multidetector CT imaging of the chest was performed using the standard protocol during bolus administration of intravenous contrast. Multiplanar CT image reconstructions and MIPs were obtained to evaluate the vascular anatomy. CONTRAST:  61mL OMNIPAQUE IOHEXOL 350 MG/ML SOLN COMPARISON:  None. FINDINGS: Cardiovascular: There are filling defects in the pulmonary arteries, extending distally from the main pulmonary arteries bilaterally. RV/LV ratio is 1.2. Atherosclerotic calcification of the aorta and coronary arteries. Heart is enlarged. No pericardial effusion. Mediastinum/Nodes:  No pathologically enlarged mediastinal, hilar or axillary lymph nodes. Esophagus is unremarkable. Lungs/Pleura: Patchy areas of peripheral ground-glass are seen predominantly in the right upper, right middle and right lower lobes. Nodular consolidation in the lateral segment right middle lobe (6/76 and right lower lobe 6/78). Minimal ground-glass involvement of the left upper lobe. No pleural fluid. Airway is unremarkable. Upper Abdomen: Visualized portions of the liver, gallbladder, adrenal glands, kidneys, spleen, pancreas, stomach and bowel are unremarkable. No upper abdominal adenopathy. Musculoskeletal: Degenerative changes in the spine. No worrisome lytic or sclerotic lesions. Review of the MIP images confirms the above findings. IMPRESSION: 1. Positive for acute PE with CT evidence of right heart strain (RV/LV Ratio = 1.2) consistent with at least submassive (intermediate risk) PE. The presence of right heart strain has been associated with an increased risk of morbidity and mortality. Critical Value/emergent results were called by telephone at the time of interpretation on 05/31/2020 at 10:29 am to provider [facility] , who verbally acknowledged these results. 2. Areas pulmonary infarction bilaterally, right greater than left. Given a slightly nodular configuration of some lesions in the right middle and right lower lobes, follow-up CT chest without contrast in 4-6 weeks could be performed to ensure resolution and exclude underlying malignancy, as clinically indicated. 3.  Aortic atherosclerosis (ICD10-I70.0). Electronically Signed   By: Lorin Picket M.D.   On: 05/31/2020 10:31   ECHOCARDIOGRAM COMPLETE  Result Date: 05/31/2020    ECHOCARDIOGRAM REPORT   Patient Name:   [name] [name] Date of Exam: 05/31/2020 Medical Rec #:  [id number]       Height:       66.0 in Accession #:    [id number]      Weight:       248.0 lb Date of Birth:  1945/08/08      BSA:          2.191 m Patient Age:    86 years         BP:           122/61 mmHg Patient Gender: M               HR:  70 bpm. Exam Location:  Inpatient Procedure: 2D Echo, Cardiac Doppler, Color Doppler and Intracardiac            Opacification Agent Indications:    Chest pain  History:        Patient has prior history of Echocardiogram examinations, most                 recent 02/09/2016. Risk Factors:Hypertension and Dyslipidemia.                 GERD.  Sonographer:    Clayton Lefort RDCS (AE) Referring Phys: [id number] [facility] AGBATA  Sonographer Comments: Suboptimal subcostal window and suboptimal apical window. IMPRESSIONS  1. Left ventricular ejection fraction, by estimation, is 60 to 65%. The left ventricle has normal function. The left ventricle has no regional wall motion abnormalities. There is moderate left ventricular hypertrophy. Left ventricular diastolic parameters are indeterminate.  2. Right ventricular systolic function is mildly reduced. The right ventricular size is mildly enlarged.  3. The mitral valve is normal in structure. No evidence of mitral valve regurgitation. No evidence of mitral stenosis.  4. The aortic valve is abnormal. There is mild calcification of the aortic valve. Aortic valve regurgitation is not visualized. No aortic stenosis is present. FINDINGS  Left Ventricle: Left ventricular ejection fraction, by estimation, is 60 to 65%. The left ventricle has normal function. The left ventricle has no regional wall motion abnormalities. Definity contrast agent was given IV to delineate the left ventricular  endocardial borders. The left ventricular internal cavity size was normal in size. There is moderate left ventricular hypertrophy. Left ventricular diastolic parameters are indeterminate. Right Ventricle: The right ventricular size is mildly enlarged. Right vetricular wall thickness was not well visualized. Right ventricular systolic function is mildly reduced. Left Atrium: Left atrial size was normal in size. Right Atrium: Right  atrial size was not well visualized. Pericardium: There is no evidence of pericardial effusion. Presence of pericardial fat pad. Mitral Valve: The mitral valve is normal in structure. No evidence of mitral valve regurgitation. No evidence of mitral valve stenosis. Tricuspid Valve: The tricuspid valve is not well visualized. Tricuspid valve regurgitation is trivial. No evidence of tricuspid stenosis. Aortic Valve: The aortic valve is abnormal. There is mild calcification of the aortic valve. Aortic valve regurgitation is not visualized. No aortic stenosis is present. Aortic valve mean gradient measures 3.0 mmHg. Aortic valve peak gradient measures 4.8 mmHg. Aortic valve area, by VTI measures 2.87 cm. Pulmonic Valve: The pulmonic valve was not well visualized. Pulmonic valve regurgitation is not visualized. No evidence of pulmonic stenosis. Aorta: The aortic root is normal in size and structure. Venous: The inferior vena cava was not well visualized. IAS/Shunts: The interatrial septum was not well visualized.  LEFT VENTRICLE PLAX 2D LVIDd:         3.80 cm  Diastology LVIDs:         2.50 cm  LV e' medial:    5.77 cm/s LV PW:         1.60 cm  LV E/e' medial:  10.0 LV IVS:        1.50 cm  LV e' lateral:   5.11 cm/s LVOT diam:     2.10 cm  LV E/e' lateral: 11.3 LV SV:         67 LV SV Index:   31 LVOT Area:     3.46 cm  RIGHT VENTRICLE RV S prime:     18.70 cm/s TAPSE (M-mode): 3.9  cm LEFT ATRIUM           Index LA diam:      3.00 cm 1.37 cm/m LA Vol (A4C): 34.6 ml 15.79 ml/m  AORTIC VALVE AV Area (Vmax):    3.03 cm AV Area (Vmean):   2.79 cm AV Area (VTI):     2.87 cm AV Vmax:           109.00 cm/s AV Vmean:          85.900 cm/s AV VTI:            0.234 m AV Peak Grad:      4.8 mmHg AV Mean Grad:      3.0 mmHg LVOT Vmax:         95.30 cm/s LVOT Vmean:        69.300 cm/s LVOT VTI:          0.194 m LVOT/AV VTI ratio: 0.83  AORTA Ao Root diam: 3.40 cm Ao Asc diam:  3.50 cm MITRAL VALVE               TRICUSPID VALVE  MV Area (PHT): 2.20 cm    TR Peak grad:   37.2 mmHg MV Decel Time: 345 msec    TR Vmax:        305.00 cm/s MV E velocity: 57.50 cm/s MV A velocity: 56.00 cm/s  SHUNTS MV E/A ratio:  1.03        Systemic VTI:  0.19 m                            Systemic Diam: 2.10 cm Cherlynn Kaiser MD Electronically signed by Cherlynn Kaiser MD Signature Date/Time: 05/31/2020/10:45:51 PM    Final    VAS [place] LOWER EXTREMITY VENOUS (DVT)  Result Date: 06/01/2020  Lower Venous DVT Study Indications: Pulmonary embolism.  Comparison Study: No prior study on file Performing Technologist: Sharion Dove RVS  Examination Guidelines: A complete evaluation includes B-mode imaging, spectral Doppler, color Doppler, and power Doppler as needed of all accessible portions of each vessel. Bilateral testing is considered an integral part of a complete examination. Limited examinations for reoccurring indications may be performed as noted. The reflux portion of the exam is performed with the patient in reverse Trendelenburg.  +---------+---------------+---------+-----------+----------+--------------+ RIGHT    CompressibilityPhasicitySpontaneityPropertiesThrombus Aging +---------+---------------+---------+-----------+----------+--------------+ CFV      Full           Yes      Yes                                 +---------+---------------+---------+-----------+----------+--------------+ SFJ      Full                                                        +---------+---------------+---------+-----------+----------+--------------+ FV Prox  Full                                                        +---------+---------------+---------+-----------+----------+--------------+ FV Mid   Full                                                        +---------+---------------+---------+-----------+----------+--------------+  FV DistalFull                                                         +---------+---------------+---------+-----------+----------+--------------+ PFV      Full                                                        +---------+---------------+---------+-----------+----------+--------------+ POP      None           No       No                   Acute          +---------+---------------+---------+-----------+----------+--------------+ PTV      None                                         Acute          +---------+---------------+---------+-----------+----------+--------------+ PERO     None                                         Acute          +---------+---------------+---------+-----------+----------+--------------+   +---------+---------------+---------+-----------+----------+--------------+ LEFT     CompressibilityPhasicitySpontaneityPropertiesThrombus Aging +---------+---------------+---------+-----------+----------+--------------+ CFV      Full                                                        +---------+---------------+---------+-----------+----------+--------------+ SFJ      Full                                                        +---------+---------------+---------+-----------+----------+--------------+ FV Prox  Full                                                        +---------+---------------+---------+-----------+----------+--------------+ FV Mid   Full                                                        +---------+---------------+---------+-----------+----------+--------------+ FV DistalFull                                                        +---------+---------------+---------+-----------+----------+--------------+  PFV      Full                                                        +---------+---------------+---------+-----------+----------+--------------+ POP      None           No       No                   Acute           +---------+---------------+---------+-----------+----------+--------------+ PTV      None                                         Acute          +---------+---------------+---------+-----------+----------+--------------+ PERO     None                                         Acute          +---------+---------------+---------+-----------+----------+--------------+     Summary: RIGHT: - Findings consistent with acute deep vein thrombosis involving the right popliteal vein, right posterior tibial veins, and right peroneal veins.  LEFT: - Findings consistent with acute deep vein thrombosis involving the left popliteal vein, left posterior tibial veins, and left peroneal veins.  *See table(s) above for measurements and observations. Electronically signed by Jamelle Haring on 06/01/2020 at 7:14:19 PM.    Final     Labs: BNP (last 3 results) Recent Labs    05/31/20 0553  BNP 79.3   Basic Metabolic Panel: Recent Labs  Lab 05/31/20 0553 06/01/20 0201  NA 137 138  K 3.7 3.7  CL 99 102  CO2 25 27  GLUCOSE 134* 109*  BUN 6* 6*  CREATININE 0.92 1.02  CALCIUM 9.0 8.7*   Liver Function Tests: No results for input(s): AST, ALT, ALKPHOS, BILITOT, PROT, ALBUMIN in the last 168 hours. No results for input(s): LIPASE, AMYLASE in the last 168 hours. No results for input(s): AMMONIA in the last 168 hours. CBC: Recent Labs  Lab 05/31/20 0553 06/01/20 0201 06/02/20 0125  WBC 7.0 7.1 6.3  HGB 14.1 11.7* 11.6*  HCT 40.8 34.5* 34.3*  MCV 98.8 98.0 97.2  PLT 177 140* 150   Cardiac Enzymes: No results for input(s): CKTOTAL, CKMB, CKMBINDEX, TROPONINI in the last 168 hours. BNP: Invalid input(s): POCBNP CBG: No results for input(s): GLUCAP in the last 168 hours. D-Dimer Recent Labs    05/31/20 0650  DDIMER 18.87*   Hgb A1c No results for input(s): HGBA1C in the last 72 hours. Lipid Profile No results for input(s): CHOL, HDL, LDLCALC, TRIG, CHOLHDL, LDLDIRECT in the last 72  hours. Thyroid function studies No results for input(s): TSH, T4TOTAL, T3FREE, THYROIDAB in the last 72 hours.  Invalid input(s): FREET3 Anemia work up No results for input(s): VITAMINB12, FOLATE, FERRITIN, TIBC, IRON, RETICCTPCT in the last 72 hours. Urinalysis    Component Value Date/Time   COLORURINE RED (A) 04/09/2014 2250   APPEARANCEUR Clear 09/09/2018 1113   LABSPEC 1.018 04/09/2014 2250   PHURINE 6.0 04/09/2014 2250   GLUCOSEU Negative 09/09/2018 1113   HGBUR  LARGE (A) 04/09/2014 2250   BILIRUBINUR Negative 09/09/2018 1113   KETONESUR NEGATIVE 04/09/2014 2250   PROTEINUR Negative 09/09/2018 1113   PROTEINUR 100 (A) 04/09/2014 2250   UROBILINOGEN negative 04/11/2015 1012   UROBILINOGEN 1.0 04/09/2014 2250   NITRITE Negative 09/09/2018 1113   NITRITE NEGATIVE 04/09/2014 2250   LEUKOCYTESUR Trace (A) 09/09/2018 1113   Sepsis Labs Invalid input(s): PROCALCITONIN,  WBC,  LACTICIDVEN Microbiology Recent Results (from the past 240 hour(s))  Respiratory Panel by RT PCR (Flu A&B, Covid) - Nasopharyngeal Swab     Status: None   Collection Time: 06/01/20 11:50 AM   Specimen: Nasopharyngeal Swab  Result Value Ref Range Status   SARS Coronavirus 2 by RT PCR NEGATIVE NEGATIVE Final    Comment: (NOTE) SARS-CoV-2 target nucleic acids are NOT DETECTED.  The SARS-CoV-2 RNA is generally detectable in upper respiratoy specimens during the acute phase of infection. The lowest concentration of SARS-CoV-2 viral copies this assay can detect is 131 copies/mL. A negative result does not preclude SARS-Cov-2 infection and should not be used as the sole basis for treatment or other patient management decisions. A negative result may occur with  improper specimen collection/handling, submission of specimen other than nasopharyngeal swab, presence of viral mutation(s) within the areas targeted by this assay, and inadequate number of viral copies (<131 copies/mL). A negative result must be  combined with clinical observations, patient history, and epidemiological information. The expected result is Negative.  Fact Sheet for Patients:  PinkCheek.be  Fact Sheet for Healthcare Providers:  GravelBags.it  This test is no t yet approved or cleared by the [place] FDA and  has been authorized for detection and/or diagnosis of SARS-CoV-2 by FDA under an Emergency Use Authorization (EUA). This EUA will remain  in effect (meaning this test can be used) for the duration of the COVID-19 declaration under Section 564(b)(1) of the Act, 21 U.S.C. section 360bbb-3(b)(1), unless the authorization is terminated or revoked sooner.     Influenza A by PCR NEGATIVE NEGATIVE Final   Influenza B by PCR NEGATIVE NEGATIVE Final    Comment: (NOTE) The Xpert Xpress SARS-CoV-2/FLU/RSV assay is intended as an aid in  the diagnosis of influenza from Nasopharyngeal swab specimens and  should not be used as a sole basis for treatment. Nasal washings and  aspirates are unacceptable for Xpert Xpress SARS-CoV-2/FLU/RSV  testing.  Fact Sheet for Patients: PinkCheek.be  Fact Sheet for Healthcare Providers: GravelBags.it  This test is not yet approved or cleared by the [place] FDA and  has been authorized for detection and/or diagnosis of SARS-CoV-2 by  FDA under an Emergency Use Authorization (EUA). This EUA will remain  in effect (meaning this test can be used) for the duration of the  Covid-19 declaration under Section 564(b)(1) of the Act, 21  U.S.C. section 360bbb-3(b)(1), unless the authorization is  terminated or revoked. Performed at [facility], [place] [address] [place], [place] [address]      Time coordinating discharge: 25  minutes  SIGNED: Antonieta Pert, MD  [facility] 06/02/2020, 1:17 PM  If 7PM-7AM, please contact  night-coverage [email or web address]

## 2020-06-02 NOTE — TOC Benefit Eligibility Note (Signed)
Transition of Care [facility]) Benefit Eligibility Note    Patient Details  Name: [name] [name] MRN: [id number] Date of Birth: 1946-03-28   Medication/Dose: Eliquis  Covered?: Yes   Prescription Coverage Preferred Pharmacy: [facility]  Spoke with Person/Company/Phone Number:: [employer]  Co-Pay: $47.00 for 30 day retail (quantity limit of 74 tabs for 30 day)  Prior Approval: No    [place] Phone Number: 06/02/2020, 12:34 PM

## 2020-06-02 NOTE — Progress Notes (Addendum)
Please see in addition to PT note:   SATURATION QUALIFICATIONS: (This note is used to comply with regulatory documentation for home oxygen)  Patient Saturations on Room Air at Rest = 90%   Patient Saturations on Room Air while Ambulating = down to 84%; able to briefly recover with PLB on RA but unable to sustain sats >90%   Patient Saturations on 2 Liters of oxygen while Ambulating = eventually recovered to as much as 92%; with seated rest after gait, dropped to 83% and required extended time and cues for PLB on 2LPM to recover back to >90%   Please briefly explain why patient needs home oxygen:sustained O2 desaturation with activity on room air with inability to adequately recover without supplemental O2  [name] [name] PT, DPT, PN1   Supplemental Physical Therapist [place]    Pager [phone] Acute Rehab Office [phone]

## 2020-06-02 NOTE — Progress Notes (Signed)
[facility] for Heparin >> apixaban Indication: pulmonary embolus  Allergies  Allergen Reactions  . Cialis [Tadalafil]   . Viagra [Sildenafil Citrate] Other (See Comments)    heartburn    Patient Measurements: Height: 5\' 6"  (167.6 cm) Weight: 107.9 kg (237 lb 12.8 oz) IBW/kg (Calculated) : 63.8 Heparin Dosing Weight: 89 kg   Vital Signs: Temp: 98.1 F (36.7 C) (11/05 0624) Temp Source: Oral (11/05 0624) BP: 132/78 (11/05 0624) Pulse Rate: 61 (11/05 0624)  Labs: Recent Labs     0000 05/31/20 0553 05/31/20 0823 05/31/20 1757 06/01/20 0201 06/01/20 1100 06/02/20 0125  HGB   < > 14.1  --   --  11.7*  --  11.6*  HCT  --  40.8  --   --  34.5*  --  34.3*  PLT  --  177  --   --  140*  --  150  HEPARINUNFRC  --   --   --    < > 0.71* 0.58 0.63  CREATININE  --  0.92  --   --  1.02  --   --   TROPONINIHS  --  38* 36*  --   --   --   --    < > = values in this interval not displayed.    Estimated Creatinine Clearance: 72 mL/min (by C-G formula based on SCr of 1.02 mg/dL).  Assessment: 74 y.o. male with PE/DVT treated with heparin and being transitioned to apixaban for anticipated discharge today. Heparin is currently therapeutic and will be discontinued at apixaban initiation. CBC stable and no bleeding noted. Provided education on indication, dosing and adverse effects of apixaban before discharge.   Goal of Therapy:  Monitor platelets by anticoagulation protocol: Yes   Plan:  Start apixaban 10 mg twice daily for 7 days then 5 mg twice daily.  Stop heparin drip at time of first apixaban dose.  Sent consult to transitions for copay. He will fill at [facility] for the first month.   Jacobo Forest PharmD Candidate 2022 06/02/2020 10:52 AM

## 2020-06-02 NOTE — Evaluation (Signed)
Physical Therapy Evaluation Patient Details Name: [name] [name] MRN: [id number] DOB: [birth year] Today's Date: 06/02/2020   History of Present Illness  74yo male presenting with SOB and DOE for the past several weeks which had been worsening over the past few days. Hypoxic in the ED, found to have submassive PE with R heart strain, B pulmonary infarcts, and B DVTs. PMH HTN, HLD, Covid diagnosed 05/04/20, CAD  Clinical Impression   Patient received in bed, very HOH but cooperative and able to follow commands well. Able to mobilize well, however does demonstrate significant O2 desaturation and HR elevation with activity as would be expected with recent covid and now PE. See accompanying sat note for details on Spo2, HR did elevate to as much as 126BPM with activity but recovered well with rest. Education provided on pulse ox use, home set up to promote energy conservation, and energy conservation strategies, as well as likely need for home O2. Left up in recliner with all needs met, daughter present. Has great family support.No need for skilled PT follow-up at DC.     Follow Up Recommendations No PT follow up    Equipment Recommendations  None recommended by PT    Recommendations for Other Services       Precautions / Restrictions Precautions Precautions: Other (comment) Precaution Comments: watch HR/sats Restrictions Weight Bearing Restrictions: No      Mobility  Bed Mobility Overal bed mobility: Independent             General bed mobility comments: no physical assist given, HOB elevated    Transfers Overall transfer level: Needs assistance Equipment used: None Transfers: Sit to/from Stand Sit to Stand: Supervision         General transfer comment: S for safety and line management, no physical assist given  Ambulation/Gait Ambulation/Gait assistance: Min guard Gait Distance (Feet): 200 Feet Assistive device: None Gait Pattern/deviations: WFL(Within Functional  Limits);Step-through pattern;Wide base of support;Trunk flexed Gait velocity: decreased   General Gait Details: slow but steady without AD; needed cues for PLB and energy conservation with gait. See sat note for further details.  Stairs            Wheelchair Mobility    Modified Rankin (Stroke Patients Only)       Balance Overall balance assessment: No apparent balance deficits (not formally assessed)                                           Pertinent Vitals/Pain Pain Assessment: No/denies pain    Home Living Family/patient expects to be discharged to:: Private residence Living Arrangements: Spouse/significant other Available Help at Discharge: Family;Available 24 hours/day Type of Home: House Home Access: Stairs to enter Entrance Stairs-Rails: Left Entrance Stairs-Number of Steps: 4 with left ascending rail Home Layout: One level Home Equipment: None;Grab bars - tub/shower;Grab bars - toilet Additional Comments: not on oxygen- wife is currently in hospital herself on the 4th floor    Prior Function Level of Independence: Independent               Hand Dominance        Extremity/Trunk Assessment   Upper Extremity Assessment Upper Extremity Assessment: Overall WFL for tasks assessed    Lower Extremity Assessment Lower Extremity Assessment: Overall WFL for tasks assessed    Cervical / Trunk Assessment Cervical / Trunk Assessment: Normal  Communication  Communication: HOH  Cognition Arousal/Alertness: Awake/alert Behavior During Therapy: WFL for tasks assessed/performed Overall Cognitive Status: Within Functional Limits for tasks assessed                                 General Comments: seems a bit annoyed at being in the hospital in general, but cooperative with PT and followed cues/commands well      General Comments General comments (skin integrity, edema, etc.): see sat note for more details    Exercises      Assessment/Plan    PT Assessment Patent does not need any further PT services  PT Problem List         PT Treatment Interventions      PT Goals (Current goals can be found in the Care Plan section)  Acute Rehab PT Goals Patient Stated Goal: go home and get out of hospital PT Goal Formulation: With patient/family Time For Goal Achievement: 06/16/20 Potential to Achieve Goals: Fair    Frequency     Barriers to discharge        Co-evaluation               AM-PAC PT "6 Clicks" Mobility  Outcome Measure Help needed turning from your back to your side while in a flat bed without using bedrails?: None Help needed moving from lying on your back to sitting on the side of a flat bed without using bedrails?: None Help needed moving to and from a bed to a chair (including a wheelchair)?: None Help needed standing up from a chair using your arms (e.g., wheelchair or bedside chair)?: None Help needed to walk in hospital room?: A Little Help needed climbing 3-5 steps with a railing? : A Little 6 Click Score: 22    End of Session Equipment Utilized During Treatment: Gait belt;Oxygen Activity Tolerance: Patient tolerated treatment well Patient left: in chair;with call bell/phone within reach;with family/visitor present Nurse Communication: Mobility status;Other (comment) (desat with gait on room air) PT Visit Diagnosis: Difficulty in walking, not elsewhere classified (R26.2)    Time: 0459-9774 PT Time Calculation (min) (ACUTE ONLY): 39 min   Charges:   PT Evaluation $PT Eval Moderate Complexity: 1 Mod PT Treatments $Gait Training: 8-22 mins $Self Care/Home Management: 8-22        [name] [name], DPT, PN1   Supplemental Physical Therapist [place]    Pager [phone] Acute Rehab Office [phone]

## 2020-06-02 NOTE — Consult Note (Signed)
NAME:  [name] [name], MRN:  [id number], DOB:  1946-07-19, LOS: 2 ADMISSION DATE:  05/31/2020, CONSULTATION DATE:  05/31/2020 REFERRING MD:  Dr. Francine Graven , CHIEF COMPLAINT: Pulmonary embolism   Brief History   74 year old male with recent diagnosis of Covid at 05/04/2020 who presented with new complaints of shortness of breath.  Evaluation on admission revealed a submassive PE with CT evidence of right heart strain.  PCCM consulted for further pulmonary consultation regarding possible interventions for PE  History of present illness   [name] [name] is a 74 year old male with a past medical history significant for recent COVID-19 05/04/2020, GERD, hypertension, sinus bradycardia, and colon polyps who presented to the emergency department with complaints of shortness of breath.  Patient is 3 weeks post Covid and began having recurrent shortness of breath and hypoxia at home prompting presentation to emergency department.  Of note patient's wife was also transported to the emergency department for chest pain and shortness of breath as well.  He denies any other additional complaints including no fever, chills, chest pain, lower extremity swelling or abdominal discomfort.  Patient and wife denies any current tobacco abuse, patient does report remote history of occasional cigar use with last use approximately 10 years ago  On admission vital signs within normal limits.  Presenting blood pressure was 111/71 with drop of 93/65.  On chart review patient's most recent outpatient visit to primary care was 04/12/2020 at that time blood pressure was 137/74.  Lab work on admission significant for mildly elevated troponin of 38  D-dimer 18.37.  CT angio chest obtained and revealed acute PE with at least submassive presentation given CT evidence of right heart strain.  Patient was admitted under hospitalist services and PCCM was consulted for further management submassive PE.  Past Medical History  Recent  Covid Hypertension GERD Sinus bradycardia Colon polyps  Significant Hospital Events   Admitted 11/3 for PE Heparin drip started  Consults:  Pulmonary critical care  Procedures:  None  Significant Diagnostic Tests:  CT angiogram chest/3 > acute PE with CT evidence of right heart strain (RV?LV ratio = 1.2) areas of pulmonary infarction bilaterally right greater than left  11/3 TTE >>  1. Left ventricular ejection fraction, by estimation, is 60 to 65%. The  left ventricle has normal function. The left ventricle has no regional  wall motion abnormalities. There is moderate left ventricular hypertrophy.  Left ventricular diastolic  parameters are indeterminate.  2. Right ventricular systolic function is mildly reduced. The right  ventricular size is mildly enlarged.  3. The mitral valve is normal in structure. No evidence of mitral valve  regurgitation. No evidence of mitral stenosis.  4. The aortic valve is abnormal. There is mild calcification of the  aortic valve. Aortic valve regurgitation is not visualized. No aortic  stenosis is present.   LE venous dopplers 11/4 >> PRELIMINARY RIGHT:  - Findings consistent with acute deep vein thrombosis involving the right  popliteal vein, right posterior tibial veins, and right peroneal veins.  LEFT:  - Findings consistent with acute deep vein thrombosis involving the left  popliteal vein, left posterior tibial veins, and left peroneal veins.   TTE showed LVEF 60-65%; moderate LVH, RV systolic function mildly reduced with mildly enlarged RV, no regional wall abnormalities   Micro Data:   Antimicrobials:  n/a  Interim history/subjective:  Denies shortness of breath. Tried to ambulate yesterday but was told he was on bedrest. Planning to sit in chair today  Objective  Blood pressure 132/78, pulse 61, temperature 98.1 F (36.7 C), temperature source Oral, resp. rate 18, height 5\' 6"  (1.676 m), weight 107.9 kg, SpO2 97 %.         Intake/Output Summary (Last 24 hours) at 06/02/2020 1010 Last data filed at 06/01/2020 2359 Gross per 24 hour  Intake 640.11 ml  Output 400 ml  Net 240.11 ml   Filed Weights   05/31/20 1552 06/01/20 0435 06/02/20 0624  Weight: 109 kg 108.2 kg 107.9 kg   Physical Exam: General: Elderly-appearing, no acute distress HENT: [place], AT, OP clear, MMM Eyes: EOMI, no scleral icterus Respiratory: Clear to auscultation bilaterally.  No crackles, wheezing or rales Cardiovascular: RRR, -M/R/G, no JVD Extremities:1+ nonpitting edema in lower extremities,-tenderness Neuro: AAO x4, CNII-XII grossly intact Skin: Intact, no rashes or bruising Psych: Normal mood, normal affect  Resolved Hospital Problem list    Assessment & Plan:  Submassive PE Bilateral lower extremity DVTs -Acute PE with CT evidence of right heart strain (RV?LV ratio = 1.2) areas of pulmonary infarction bilaterally right greater than left, in the setting of post covid -Low risk on PESI -Echo unremarkable P: - Wean supplemental O2 for goal SpO2 >88%. Will need ambulatory O2 prior to discharge to determine home O2 needs. - Transition from heparin gtt to [place] per primary team, anticoagulation for 3 months - Ambulate as tolerated, OOB - Will schedule pulmonary outpatient follow-up in 3 months to discuss anticoagulation - Will need PCP follow-up within 1-2 weeks   Goals of Care Patient and daughter wished to discuss goals of care. We discussed quality of life and what DNR and full code entailed. After discussion, patient wished to be Full Code. This has been updated in the EMR and primary team and bedside RN notified of change.  PCCM will sign off.  Please do not hesitate to call us back if we can be of any further assistance.  Best practice:  Diet: Heart heathy  Pain/Anxiety/Delirium protocol (if indicated): PRN VAP protocol (if indicated): N/A DVT prophylaxis: Heparin drip  GI prophylaxis: PPI Glucose control: Monitor   Mobility: Up with assistance after LE dopplers  Code Status: Full Family Communication: Updated at bedside Disposition: telemetry   Labs   CBC: Recent Labs  Lab 05/31/20 0553 06/01/20 0201 06/02/20 0125  WBC 7.0 7.1 6.3  HGB 14.1 11.7* 11.6*  HCT 40.8 34.5* 34.3*  MCV 98.8 98.0 97.2  PLT 177 140* 119    Basic Metabolic Panel: Recent Labs  Lab 05/31/20 0553 06/01/20 0201  NA 137 138  K 3.7 3.7  CL 99 102  CO2 25 27  GLUCOSE 134* 109*  BUN 6* 6*  CREATININE 0.92 1.02  CALCIUM 9.0 8.7*   GFR: Estimated Creatinine Clearance: 72 mL/min (by C-G formula based on SCr of 1.02 mg/dL). Recent Labs  Lab 05/31/20 0553 06/01/20 0201 06/02/20 0125  WBC 7.0 7.1 6.3    Liver Function Tests: No results for input(s): AST, ALT, ALKPHOS, BILITOT, PROT, ALBUMIN in the last 168 hours. No results for input(s): LIPASE, AMYLASE in the last 168 hours. No results for input(s): AMMONIA in the last 168 hours.  ABG No results found for: PHART, PCO2ART, PO2ART, HCO3, TCO2, ACIDBASEDEF, O2SAT   Coagulation Profile: No results for input(s): INR, PROTIME in the last 168 hours.  Cardiac Enzymes: No results for input(s): CKTOTAL, CKMB, CKMBINDEX, TROPONINI in the last 168 hours.  HbA1C: Hemoglobin A1C  Date/Time Value Ref Range Status  10/11/2013 08:55 AM 5.6%  Final  Comment:    normal range 4.2-6.3%    CBG: No results for input(s): GLUCAP in the last 168 hours.  Care Time: 76 min  Rodman Pickle, M.D. [facility] Pulmonary/Critical Care Medicine 06/02/2020 10:10 AM

## 2020-06-02 NOTE — Plan of Care (Signed)

## 2020-06-05 DIAGNOSIS — I2699 Other pulmonary embolism without acute cor pulmonale: Secondary | ICD-10-CM | POA: Diagnosis not present

## 2020-06-05 DIAGNOSIS — Z8616 Personal history of COVID-19: Secondary | ICD-10-CM | POA: Diagnosis not present

## 2020-06-08 ENCOUNTER — Other Ambulatory Visit: Payer: Self-pay

## 2020-06-08 ENCOUNTER — Encounter: Payer: Self-pay | Admitting: Family Medicine

## 2020-06-08 ENCOUNTER — Ambulatory Visit (INDEPENDENT_AMBULATORY_CARE_PROVIDER_SITE_OTHER): Payer: Medicare HMO | Admitting: Family Medicine

## 2020-06-08 VITALS — BP 109/79 | HR 79 | Temp 98.0°F | Ht 66.0 in | Wt 245.0 lb

## 2020-06-08 DIAGNOSIS — I2699 Other pulmonary embolism without acute cor pulmonale: Secondary | ICD-10-CM | POA: Diagnosis not present

## 2020-06-08 LAB — CBC WITH DIFFERENTIAL/PLATELET
Basophils Absolute: 0.1 10*3/uL (ref 0.0–0.2)
Basos: 1 %
EOS (ABSOLUTE): 0.5 10*3/uL — ABNORMAL HIGH (ref 0.0–0.4)
Eos: 7 %
Hematocrit: 37.9 % (ref 37.5–51.0)
Hemoglobin: 13.3 g/dL (ref 13.0–17.7)
Immature Grans (Abs): 0.1 10*3/uL (ref 0.0–0.1)
Immature Granulocytes: 1 %
Lymphocytes Absolute: 2.2 10*3/uL (ref 0.7–3.1)
Lymphs: 30 %
MCH: 34.4 pg — ABNORMAL HIGH (ref 26.6–33.0)
MCHC: 35.1 g/dL (ref 31.5–35.7)
MCV: 98 fL — ABNORMAL HIGH (ref 79–97)
Monocytes Absolute: 0.6 10*3/uL (ref 0.1–0.9)
Monocytes: 8 %
Neutrophils Absolute: 4.2 10*3/uL (ref 1.4–7.0)
Neutrophils: 53 %
Platelets: 313 10*3/uL (ref 150–450)
RBC: 3.87 x10E6/uL — ABNORMAL LOW (ref 4.14–5.80)
RDW: 13 % (ref 11.6–15.4)
WBC: 7.6 10*3/uL (ref 3.4–10.8)

## 2020-06-08 LAB — CMP14+EGFR
ALT: 24 IU/L (ref 0–44)
AST: 30 IU/L (ref 0–40)
Albumin/Globulin Ratio: 1.4 (ref 1.2–2.2)
Albumin: 3.6 g/dL — ABNORMAL LOW (ref 3.7–4.7)
Alkaline Phosphatase: 60 IU/L (ref 44–121)
BUN/Creatinine Ratio: 10 (ref 10–24)
BUN: 9 mg/dL (ref 8–27)
Bilirubin Total: 0.5 mg/dL (ref 0.0–1.2)
CO2: 25 mmol/L (ref 20–29)
Calcium: 9.7 mg/dL (ref 8.6–10.2)
Chloride: 104 mmol/L (ref 96–106)
Creatinine, Ser: 0.86 mg/dL (ref 0.76–1.27)
GFR calc Af Amer: 98 mL/min/{1.73_m2} (ref >59)
GFR calc non Af Amer: 85 mL/min/{1.73_m2} (ref >59)
Globulin, Total: 2.5 g/dL (ref 1.5–4.5)
Glucose: 112 mg/dL — ABNORMAL HIGH (ref 65–99)
Potassium: 4.3 mmol/L (ref 3.5–5.2)
Sodium: 143 mmol/L (ref 134–144)
Total Protein: 6.1 g/dL (ref 6.0–8.5)

## 2020-06-08 MED ORDER — APIXABAN 5 MG PO TABS
5.0000 mg | ORAL_TABLET | Freq: Two times a day (BID) | ORAL | 5 refills | Status: DC
Start: 2020-06-09 — End: 2020-08-10

## 2020-06-08 NOTE — Progress Notes (Signed)
BP 109/79    Pulse 79    Temp 98 F (36.7 C)    Ht '5\' 6"'  (1.676 m)    Wt 245 lb (111.1 kg)    SpO2 94%    BMI 39.54 kg/m    Subjective:   Patient ID: [name] [name], male    DOB: [birth year], 74 y.o.   MRN: [id number]  HPI: NAS WAFER is a 74 y.o. male presenting on 06/08/2020 for Hospitalization Follow-up (blood clots)   HPI Is coming in for hospital follow-up for bilateral PEs. Patient was initially diagnosed with Covid just over a month ago and then went in with shortness of breath and chest tightness on 05/31/2020 and was found to have bilateral submassive pulmonary emboli.  He was sent home on oxygen but now he has been off oxygen for 2 or 3 days and his saturation is 94% today.  He is doing a lot better.  Relevant past medical, surgical, family and social history reviewed and updated as indicated. Interim medical history since our last visit reviewed. Allergies and medications reviewed and updated.  Review of Systems  Constitutional: Negative for chills and fever.  Respiratory: Positive for shortness of breath. Negative for wheezing.   Cardiovascular: Negative for chest pain and leg swelling.  Gastrointestinal: Negative for blood in stool.  Genitourinary: Negative for hematuria.  Musculoskeletal: Negative for back pain and gait problem.  Skin: Negative for rash.  Neurological: Negative for dizziness, weakness and light-headedness.  All other systems reviewed and are negative.   Per HPI unless specifically indicated above   Allergies as of 06/08/2020      Reactions   Cialis [tadalafil]    Viagra [sildenafil Citrate] Other (See Comments)   heartburn      Medication List       Accurate as of June 08, 2020 10:17 AM. If you have any questions, ask your nurse or doctor.        albuterol 108 (90 Base) MCG/ACT inhaler Commonly known as: VENTOLIN HFA Inhale 2 puffs into the lungs every 6 (six) hours as needed for shortness of breath.   apixaban 5 MG Tabs  tablet Commonly known as: ELIQUIS Take 2 tablets (10 mg total) by mouth 2 (two) times daily for 7 days.   apixaban 5 MG Tabs tablet Commonly known as: ELIQUIS Take 1 tablet (5 mg total) by mouth 2 (two) times daily. Start after finishing 1 wk of 10 mg bid. Start taking on: June 09, 2020   aspirin 81 MG tablet Take 81 mg by mouth daily.   benazepril 10 MG tablet Commonly known as: LOTENSIN Take 1 tablet (10 mg total) by mouth daily.   Fish Oil 1000 MG Caps Take 3 capsules (3,000 mg total) by mouth daily.   Garlic 5364 MG Caps Take 2 capsules by mouth daily.   NON FORMULARY Take 1 tablet by mouth daily. Equate allergy tablet   omeprazole 20 MG capsule Commonly known as: PRILOSEC Take 1 capsule (20 mg total) by mouth daily.   pravastatin 40 MG tablet Commonly known as: PRAVACHOL Take 1 tablet (40 mg total) by mouth daily.   vitamin C 500 MG tablet Commonly known as: ASCORBIC ACID Take 500 mg by mouth daily.   Vitamin D-3 125 MCG (5000 UT) Tabs Take 5,000 Units by mouth daily.        Objective:   BP 109/79    Pulse 79    Temp 98 F (36.7 C)    Ht  '5\' 6"'  (1.676 m)    Wt 245 lb (111.1 kg)    SpO2 94%    BMI 39.54 kg/m   Wt Readings from Last 3 Encounters:  06/08/20 245 lb (111.1 kg)  06/02/20 237 lb 12.8 oz (107.9 kg)  04/12/20 248 lb (112.5 kg)    Physical Exam Vitals and nursing note reviewed.  Constitutional:      General: He is not in acute distress.    Appearance: He is well-developed. He is not diaphoretic.  Eyes:     General: No scleral icterus.    Conjunctiva/sclera: Conjunctivae normal.  Neck:     Thyroid: No thyromegaly.  Cardiovascular:     Rate and Rhythm: Normal rate and regular rhythm.     Heart sounds: Normal heart sounds. No murmur heard.   Pulmonary:     Effort: Pulmonary effort is normal. No respiratory distress.     Breath sounds: Normal breath sounds. No wheezing.  Musculoskeletal:     Cervical back: Neck supple.   Lymphadenopathy:     Cervical: No cervical adenopathy.  Skin:    General: Skin is warm and dry.     Findings: No rash.  Neurological:     Mental Status: He is alert and oriented to person, place, and time.     Coordination: Coordination normal.  Psychiatric:        Behavior: Behavior normal.       Assessment & Plan:   Problem List Items Addressed This Visit    None    Visit Diagnoses    Bilateral pulmonary embolism (HCC)    -  Primary   Relevant Medications   apixaban (ELIQUIS) 5 MG TABS tablet   Other Relevant Orders   CBC with Differential/Platelet (Completed)   CMP14+EGFR (Completed)      Continue Eliquis, he likely will need 6 months to a year, will evaluate in the future. Follow up plan: Return in about 2 months (around 08/08/2020), or if symptoms worsen or fail to improve, for PE bilateral.  Counseling provided for all of the vaccine components No orders of the defined types were placed in this encounter.   Caryl Pina, MD [facility] 06/08/2020, 10:17 AM

## 2020-07-04 ENCOUNTER — Encounter: Payer: Self-pay | Admitting: Pulmonary Disease

## 2020-07-04 ENCOUNTER — Ambulatory Visit: Payer: Medicare HMO | Admitting: Pulmonary Disease

## 2020-07-04 ENCOUNTER — Other Ambulatory Visit: Payer: Self-pay

## 2020-07-04 VITALS — BP 122/60 | HR 72 | Temp 97.3°F | Ht 66.0 in | Wt 249.6 lb

## 2020-07-04 DIAGNOSIS — I824Z3 Acute embolism and thrombosis of unspecified deep veins of distal lower extremity, bilateral: Secondary | ICD-10-CM

## 2020-07-04 DIAGNOSIS — I2699 Other pulmonary embolism without acute cor pulmonale: Secondary | ICD-10-CM | POA: Diagnosis not present

## 2020-07-04 NOTE — Progress Notes (Signed)
Subjective:   PATIENT ID: [name] [name] GENDER: male DOB: January 01, 1946, MRN: [id number]   HPI  Chief Complaint  Patient presents with  . Hospitalization Follow-up    denies cough, shortness of breath , or wheezing    Reason for Visit: New consult for pulmonary embolism  [name] is a 74 year old male with recent covid infection in October 2021 who was hospitalization in November for acute pulmonary embolism. Discharge summary for 11/3-11/5 2021 were reviewed. In addition to his PE he was diagnosed with right lower extremity DVT. He was treated with anticoagulation. On discharge, he required oxygen with ambulation.  PCP note from 06/08/20 by Dr. Warrick Parisian reviewed. Oxygenation was noted to be improved and saturated well without supplemental O2. Plan for total 6 months of anticoagulation.  Today he is seen in Pulmonary clinic. Denies symptoms of shortness of breath, chest pain or discomfort. Compliant with his blood thinners. No bruising or rashes noted. Overall he feels he is doing well since his hospitalization. He believes that his clots may be related to not being as active when he was sick with covid. Now ambulates without assistance or issue.  I have personally reviewed patient's past medical/family/social history, allergies, current medications.  Past Medical History:  Diagnosis Date  . Colon polyp   . GERD (gastroesophageal reflux disease)   . Hemorrhoid   . Hyperplasia of prostate   . Hypertension   . Other and unspecified hyperlipidemia   . Other testicular hypofunction   . Pneumonia   . Pulmonary embolism ([place])   . Sinus bradycardia      Family History  Problem Relation Age of Onset  . Heart attack Mother   . Stroke Mother   . Diabetes Father   . Diabetes Daughter      Social History   Occupational History  . Occupation: retired  Tobacco Use  . Smoking status: Former Smoker    Types: Cigars    Quit date: 2015    Years since quitting: 6.9  .  Smokeless tobacco: Current User    Types: Chew  . Tobacco comment: only 1 cigar on occassion  Vaping Use  . Vaping Use: Never used  Substance and Sexual Activity  . Alcohol use: Yes    Comment: occasional  . Drug use: No  . Sexual activity: Not Currently    Allergies  Allergen Reactions  . Cialis [Tadalafil]   . Viagra [Sildenafil Citrate] Other (See Comments)    heartburn     Outpatient Medications Prior to Visit  Medication Sig Dispense Refill  . albuterol (VENTOLIN HFA) 108 (90 Base) MCG/ACT inhaler Inhale 2 puffs into the lungs every 6 (six) hours as needed for shortness of breath.    Marland Kitchen apixaban (ELIQUIS) 5 MG TABS tablet Take 1 tablet (5 mg total) by mouth 2 (two) times daily. Start after finishing 1 wk of 10 mg bid. 60 tablet 5  . aspirin 81 MG tablet Take 81 mg by mouth daily.      . benazepril (LOTENSIN) 10 MG tablet Take 1 tablet (10 mg total) by mouth daily. 90 tablet 3  . Cholecalciferol (VITAMIN D-3) 5000 UNITS TABS Take 5,000 Units by mouth daily.     . Garlic 9629 MG CAPS Take 2 capsules by mouth daily.    . NON FORMULARY Take 1 tablet by mouth daily. Equate allergy tablet    . Omega-3 Fatty Acids (FISH OIL) 1000 MG CAPS Take 3 capsules (3,000 mg total) by  mouth daily.  0  . omeprazole (PRILOSEC) 20 MG capsule Take 1 capsule (20 mg total) by mouth daily. 90 capsule 3  . pravastatin (PRAVACHOL) 40 MG tablet Take 1 tablet (40 mg total) by mouth daily. 90 tablet 3  . vitamin C (ASCORBIC ACID) 500 MG tablet Take 500 mg by mouth daily.     No facility-administered medications prior to visit.    Review of Systems  Constitutional: Negative for chills, diaphoresis, fever, malaise/fatigue and weight loss.  HENT: Negative for congestion, ear pain and sore throat.   Respiratory: Negative for cough, hemoptysis, sputum production, shortness of breath and wheezing.   Cardiovascular: Negative for chest pain, palpitations and leg swelling.  Gastrointestinal: Negative for  abdominal pain, heartburn and nausea.  Genitourinary: Negative for frequency.  Musculoskeletal: Negative for joint pain and myalgias.  Skin: Negative for itching and rash.  Neurological: Negative for dizziness, weakness and headaches.  Endo/Heme/Allergies: Does not bruise/bleed easily.  Psychiatric/Behavioral: Negative for depression. The patient is not nervous/anxious.      Objective:   Vitals:   07/04/20 0919  BP: 122/60  Pulse: 72  Temp: (!) 97.3 F (36.3 C)  TempSrc: Temporal  SpO2: 95%  Weight: 249 lb 9.6 oz (113.2 kg)  Height: 5\' 6"  (1.676 m)   SpO2: 95 % O2 Device: None (Room air)  Physical Exam: General: Well-appearing, no acute distress HENT: [place], AT Eyes: EOMI, no scleral icterus Respiratory: Clear to auscultation bilaterally.  No crackles, wheezing or rales Cardiovascular: RRR, -M/R/G, no JVD Extremities:-Edema,-tenderness Neuro: AAO x4, CNII-XII grossly intact Skin: Intact, no rashes or bruising Psych: Normal mood, normal affect  Data Reviewed:  Imaging: CTA 05/31/20 - Acute PE bilaterally. GGO R>L suggestive of pulmonary infarct PFT: None on file  Labs: CBC    Component Value Date/Time   WBC 7.6 08/14/2020 1947   RBC 4.47 08/14/2020 1947   HGB 15.0 08/14/2020 1947   HGB 15.1 08/10/2020 0848   HCT 42.6 08/14/2020 1947   HCT 44.0 08/10/2020 0848   PLT 188 08/14/2020 1947   PLT 220 08/10/2020 0848   MCV 95.3 08/14/2020 1947   MCV 97 08/10/2020 0848   MCH 33.6 08/14/2020 1947   MCHC 35.2 08/14/2020 1947   RDW 12.9 08/14/2020 1947   RDW 12.7 08/10/2020 0848   LYMPHSABS 3.0 08/10/2020 0848   EOSABS 0.2 08/10/2020 0848   BASOSABS 0.1 08/10/2020 0848   Imaging, labs and test noted above have been reviewed independently by me.     Assessment & Plan:   Discussion: 74 year old male with pulmonary embolism. Reviewed CT and addressed questions. We discussed clinical course. Currently on RA and ambulatory. PE/DVT likely provoked in setting of  inflammatory viral illness and decreased activity.  Pulmonary embolism, likely provoked in setting of covid infection Bilateral lower extremity DVTs --Continue 3-6 months of anticoagulation per PCP  Health Maintenance Immunization History  Administered Date(s) Administered  . Pneumococcal Conjugate-13 05/03/2013  . Pneumococcal Polysaccharide-23 07/29/2009  . Td 07/30/2003   CT Lung Screen - not indicated  No orders of the defined types were placed in this encounter. No orders of the defined types were placed in this encounter.   Return if symptoms worsen or fail to improve.  I have spent a total time of 45-minutes on the day of the appointment reviewing prior documentation, coordinating care and discussing medical diagnosis and plan with the patient/family. Imaging, labs and tests included in this note have been reviewed and interpreted independently by me.  [place], MD [facility] 07/04/2020 9:35 AM  Office Number [phone]

## 2020-07-04 NOTE — Patient Instructions (Addendum)
Pulmonary embolism, likely provoked in setting of covid infection Bilateral lower extremity DVTs --Continue 3-6 months of anticoagulation per PCP  Follow-up with me as needed

## 2020-07-05 DIAGNOSIS — M170 Bilateral primary osteoarthritis of knee: Secondary | ICD-10-CM | POA: Diagnosis not present

## 2020-07-17 ENCOUNTER — Institutional Professional Consult (permissible substitution): Payer: Medicare HMO | Admitting: Pulmonary Disease

## 2020-08-10 ENCOUNTER — Ambulatory Visit (INDEPENDENT_AMBULATORY_CARE_PROVIDER_SITE_OTHER): Payer: Medicare HMO | Admitting: Family Medicine

## 2020-08-10 ENCOUNTER — Encounter: Payer: Self-pay | Admitting: Family Medicine

## 2020-08-10 ENCOUNTER — Other Ambulatory Visit: Payer: Self-pay

## 2020-08-10 VITALS — BP 147/74 | HR 67 | Ht 66.0 in | Wt 245.0 lb

## 2020-08-10 DIAGNOSIS — R350 Frequency of micturition: Secondary | ICD-10-CM

## 2020-08-10 DIAGNOSIS — I10 Essential (primary) hypertension: Secondary | ICD-10-CM

## 2020-08-10 DIAGNOSIS — N401 Enlarged prostate with lower urinary tract symptoms: Secondary | ICD-10-CM

## 2020-08-10 DIAGNOSIS — Z86711 Personal history of pulmonary embolism: Secondary | ICD-10-CM | POA: Diagnosis not present

## 2020-08-10 LAB — CBC WITH DIFFERENTIAL/PLATELET
Basophils Absolute: 0.1 10*3/uL (ref 0.0–0.2)
Basos: 1 %
EOS (ABSOLUTE): 0.2 10*3/uL (ref 0.0–0.4)
Eos: 3 %
Hematocrit: 44 % (ref 37.5–51.0)
Hemoglobin: 15.1 g/dL (ref 13.0–17.7)
Immature Grans (Abs): 0 10*3/uL (ref 0.0–0.1)
Immature Granulocytes: 0 %
Lymphocytes Absolute: 3 10*3/uL (ref 0.7–3.1)
Lymphs: 38 %
MCH: 33.3 pg — ABNORMAL HIGH (ref 26.6–33.0)
MCHC: 34.3 g/dL (ref 31.5–35.7)
MCV: 97 fL (ref 79–97)
Monocytes Absolute: 0.4 10*3/uL (ref 0.1–0.9)
Monocytes: 6 %
Neutrophils Absolute: 4.2 10*3/uL (ref 1.4–7.0)
Neutrophils: 52 %
Platelets: 220 10*3/uL (ref 150–450)
RBC: 4.54 x10E6/uL (ref 4.14–5.80)
RDW: 12.7 % (ref 11.6–15.4)
WBC: 7.9 10*3/uL (ref 3.4–10.8)

## 2020-08-10 MED ORDER — TAMSULOSIN HCL 0.4 MG PO CAPS: 0.4000 mg | ORAL_CAPSULE | Freq: Every day | ORAL | 3 refills | Status: DC

## 2020-08-10 MED ORDER — APIXABAN 5 MG PO TABS
5.0000 mg | ORAL_TABLET | Freq: Two times a day (BID) | ORAL | 5 refills | Status: DC
Start: 2020-08-10 — End: 2020-08-29

## 2020-08-10 NOTE — Progress Notes (Signed)
BP (!) 147/74   Pulse 67   Ht 5\' 6"  (1.676 m)   Wt 245 lb (111.1 kg)   SpO2 94%   BMI 39.54 kg/m    Subjective:   Patient ID: [name] [name], male    DOB: Mar 05, 1946, 75 y.o.   MRN: [id number]  HPI: [name] [name] is a 75 y.o. male presenting on 08/10/2020 for Pulmonary Embolism (Denies SOB, chest pain, coughing)   HPI Hypertension Patient is currently on benazepril, and their blood pressure today is 147/74. Patient denies any lightheadedness or dizziness. Patient denies headaches, blurred vision, chest pains, shortness of breath, or weakness. Denies any side effects from medication and is content with current medication.   Patient is coming in for follow-up for pulmonary embolism.  He is taking Eliquis.  He says he does not have any bruising or bleeding.  His breathing is going very well.  He is currently taking Eliquis for it.  Relevant past medical, surgical, family and social history reviewed and updated as indicated. Interim medical history since our last visit reviewed. Allergies and medications reviewed and updated.  Review of Systems  Constitutional: Negative for chills and fever.  Eyes: Negative for visual disturbance.  Respiratory: Negative for shortness of breath and wheezing.   Cardiovascular: Negative for chest pain and leg swelling.  Genitourinary: Positive for difficulty urinating and frequency. Negative for flank pain and hematuria.  Musculoskeletal: Negative for back pain and gait problem.  Skin: Negative for rash.  All other systems reviewed and are negative.   Per HPI unless specifically indicated above   Allergies as of 08/10/2020      Reactions   Cialis [tadalafil]    Viagra [sildenafil Citrate] Other (See Comments)   heartburn      Medication List       Accurate as of August 10, 2020  8:35 AM. If you have any questions, ask your nurse or doctor.        albuterol 108 (90 Base) MCG/ACT inhaler Commonly known as: VENTOLIN HFA Inhale 2  puffs into the lungs every 6 (six) hours as needed for shortness of breath.   apixaban 5 MG Tabs tablet Commonly known as: ELIQUIS Take 1 tablet (5 mg total) by mouth 2 (two) times daily. Start after finishing 1 wk of 10 mg bid.   aspirin 81 MG tablet Take 81 mg by mouth daily.   benazepril 10 MG tablet Commonly known as: LOTENSIN Take 1 tablet (10 mg total) by mouth daily.   Fish Oil 1000 MG Caps Take 3 capsules (3,000 mg total) by mouth daily.   Garlic 4818 MG Caps Take 2 capsules by mouth daily.   NON FORMULARY Take 1 tablet by mouth daily. Equate allergy tablet   omeprazole 20 MG capsule Commonly known as: PRILOSEC Take 1 capsule (20 mg total) by mouth daily.   pravastatin 40 MG tablet Commonly known as: PRAVACHOL Take 1 tablet (40 mg total) by mouth daily.   vitamin C 500 MG tablet Commonly known as: ASCORBIC ACID Take 500 mg by mouth daily.   Vitamin D-3 125 MCG (5000 UT) Tabs Take 5,000 Units by mouth daily.        Objective:   BP (!) 147/74   Pulse 67   Ht 5\' 6"  (1.676 m)   Wt 245 lb (111.1 kg)   SpO2 94%   BMI 39.54 kg/m   Wt Readings from Last 3 Encounters:  08/10/20 245 lb (111.1 kg)  07/04/20 249 lb 9.6  oz (113.2 kg)  06/08/20 245 lb (111.1 kg)    Physical Exam Vitals and nursing note reviewed.  Constitutional:      General: He is not in acute distress.    Appearance: He is well-developed and well-nourished. He is not diaphoretic.  Eyes:     General: No scleral icterus.    Extraocular Movements: EOM normal.     Conjunctiva/sclera: Conjunctivae normal.  Neck:     Thyroid: No thyromegaly.  Cardiovascular:     Rate and Rhythm: Normal rate and regular rhythm.     Pulses: Intact distal pulses.     Heart sounds: Normal heart sounds. No murmur heard.   Pulmonary:     Effort: Pulmonary effort is normal. No respiratory distress.     Breath sounds: Normal breath sounds. No wheezing.  Musculoskeletal:        General: No edema. Normal range  of motion.     Cervical back: Neck supple.  Lymphadenopathy:     Cervical: No cervical adenopathy.  Skin:    General: Skin is warm and dry.     Findings: No rash.  Neurological:     Mental Status: He is alert and oriented to person, place, and time.     Coordination: Coordination normal.  Psychiatric:        Mood and Affect: Mood and affect normal.        Behavior: Behavior normal.       Assessment & Plan:   Problem List Items Addressed This Visit      Cardiovascular and Mediastinum   Hypertension, essential   Relevant Medications   apixaban (ELIQUIS) 5 MG TABS tablet     Genitourinary   BPH (benign prostatic hyperplasia)   Relevant Medications   tamsulosin (FLOMAX) 0.4 MG CAPS capsule     Other   History of pulmonary embolus (PE) - Primary   Relevant Orders   CBC with Differential/Platelet      Continue Eliquis, will do full 6 months because of pulmonary embolus.  We will do Flomax and see if that works for him to help with his urinary frequency.  May consider Myrbetriq in the future if Lomax does not work for Follow up plan: Return in about 19 weeks (around 12/21/2020), or if symptoms worsen or fail to improve, for Hypertension cholesterol recheck PE.  Counseling provided for all of the vaccine components No orders of the defined types were placed in this encounter.   Caryl Pina, MD [facility] 08/10/2020, 8:35 AM

## 2020-08-14 ENCOUNTER — Encounter (HOSPITAL_COMMUNITY): Payer: Self-pay

## 2020-08-14 ENCOUNTER — Emergency Department (HOSPITAL_COMMUNITY)
Admission: EM | Admit: 2020-08-14 | Discharge: 2020-08-15 | Disposition: A | Discharge status: Home / Self Care | Payer: Medicare HMO | Attending: Emergency Medicine | Admitting: Emergency Medicine

## 2020-08-14 ENCOUNTER — Other Ambulatory Visit: Payer: Self-pay

## 2020-08-14 DIAGNOSIS — R918 Other nonspecific abnormal finding of lung field: Secondary | ICD-10-CM | POA: Diagnosis not present

## 2020-08-14 DIAGNOSIS — K219 Gastro-esophageal reflux disease without esophagitis: Secondary | ICD-10-CM | POA: Diagnosis not present

## 2020-08-14 DIAGNOSIS — Z87891 Personal history of nicotine dependence: Secondary | ICD-10-CM | POA: Insufficient documentation

## 2020-08-14 DIAGNOSIS — Z7901 Long term (current) use of anticoagulants: Secondary | ICD-10-CM | POA: Insufficient documentation

## 2020-08-14 DIAGNOSIS — R1084 Generalized abdominal pain: Secondary | ICD-10-CM | POA: Insufficient documentation

## 2020-08-14 DIAGNOSIS — I10 Essential (primary) hypertension: Secondary | ICD-10-CM | POA: Diagnosis not present

## 2020-08-14 DIAGNOSIS — R109 Unspecified abdominal pain: Secondary | ICD-10-CM | POA: Diagnosis present

## 2020-08-14 DIAGNOSIS — R1013 Epigastric pain: Secondary | ICD-10-CM | POA: Diagnosis not present

## 2020-08-14 DIAGNOSIS — Z7982 Long term (current) use of aspirin: Secondary | ICD-10-CM | POA: Diagnosis not present

## 2020-08-14 DIAGNOSIS — Z79899 Other long term (current) drug therapy: Secondary | ICD-10-CM | POA: Diagnosis not present

## 2020-08-14 DIAGNOSIS — R001 Bradycardia, unspecified: Secondary | ICD-10-CM | POA: Diagnosis not present

## 2020-08-14 DIAGNOSIS — K55059 Acute (reversible) ischemia of intestine, part and extent unspecified: Secondary | ICD-10-CM | POA: Diagnosis not present

## 2020-08-14 LAB — CBC
HCT: 42.6 % (ref 39.0–52.0)
Hemoglobin: 15 g/dL (ref 13.0–17.0)
MCH: 33.6 pg (ref 26.0–34.0)
MCHC: 35.2 g/dL (ref 30.0–36.0)
MCV: 95.3 fL (ref 80.0–100.0)
Platelets: 188 10*3/uL (ref 150–400)
RBC: 4.47 MIL/uL (ref 4.22–5.81)
RDW: 12.9 % (ref 11.5–15.5)
WBC: 7.6 10*3/uL (ref 4.0–10.5)
nRBC: 0 % (ref 0.0–0.2)

## 2020-08-14 LAB — COMPREHENSIVE METABOLIC PANEL
ALT: 27 U/L (ref 0–44)
AST: 41 U/L (ref 15–41)
Albumin: 3.5 g/dL (ref 3.5–5.0)
Alkaline Phosphatase: 45 U/L (ref 38–126)
Anion gap: 12 (ref 5–15)
BUN: 8 mg/dL (ref 8–23)
CO2: 27 mmol/L (ref 22–32)
Calcium: 9.2 mg/dL (ref 8.9–10.3)
Chloride: 100 mmol/L (ref 98–111)
Creatinine, Ser: 0.88 mg/dL (ref 0.61–1.24)
GFR, Estimated: >60 mL/min (ref >60)
Glucose, Bld: 119 mg/dL — ABNORMAL HIGH (ref 70–99)
Potassium: 3.9 mmol/L (ref 3.5–5.1)
Sodium: 139 mmol/L (ref 135–145)
Total Bilirubin: 1.4 mg/dL — ABNORMAL HIGH (ref 0.3–1.2)
Total Protein: 6.6 g/dL (ref 6.5–8.1)

## 2020-08-14 LAB — LIPASE, BLOOD: Lipase: 23 U/L (ref 11–51)

## 2020-08-14 NOTE — ED Triage Notes (Signed)
Pt states that he has been having generalized abd pain for the past month, worse after eating, denies n/v/d/fevers.

## 2020-08-15 ENCOUNTER — Emergency Department (HOSPITAL_BASED_OUTPATIENT_CLINIC_OR_DEPARTMENT_OTHER): Payer: Medicare HMO

## 2020-08-15 ENCOUNTER — Other Ambulatory Visit: Payer: Self-pay

## 2020-08-15 ENCOUNTER — Emergency Department (HOSPITAL_BASED_OUTPATIENT_CLINIC_OR_DEPARTMENT_OTHER)
Admission: EM | Admit: 2020-08-15 | Discharge: 2020-08-15 | Disposition: A | Discharge status: Home / Self Care | Payer: Medicare HMO | Source: Home / Self Care | Attending: Emergency Medicine | Admitting: Emergency Medicine

## 2020-08-15 ENCOUNTER — Telehealth: Payer: Self-pay | Admitting: Internal Medicine

## 2020-08-15 ENCOUNTER — Encounter (HOSPITAL_BASED_OUTPATIENT_CLINIC_OR_DEPARTMENT_OTHER): Payer: Self-pay | Admitting: Emergency Medicine

## 2020-08-15 DIAGNOSIS — K219 Gastro-esophageal reflux disease without esophagitis: Secondary | ICD-10-CM | POA: Insufficient documentation

## 2020-08-15 DIAGNOSIS — I2699 Other pulmonary embolism without acute cor pulmonale: Secondary | ICD-10-CM | POA: Insufficient documentation

## 2020-08-15 DIAGNOSIS — K55059 Acute (reversible) ischemia of intestine, part and extent unspecified: Secondary | ICD-10-CM | POA: Diagnosis not present

## 2020-08-15 DIAGNOSIS — Z7982 Long term (current) use of aspirin: Secondary | ICD-10-CM | POA: Insufficient documentation

## 2020-08-15 DIAGNOSIS — Z79899 Other long term (current) drug therapy: Secondary | ICD-10-CM | POA: Insufficient documentation

## 2020-08-15 DIAGNOSIS — R001 Bradycardia, unspecified: Secondary | ICD-10-CM | POA: Diagnosis not present

## 2020-08-15 DIAGNOSIS — R1084 Generalized abdominal pain: Secondary | ICD-10-CM | POA: Insufficient documentation

## 2020-08-15 DIAGNOSIS — R1013 Epigastric pain: Secondary | ICD-10-CM | POA: Diagnosis not present

## 2020-08-15 DIAGNOSIS — Z87891 Personal history of nicotine dependence: Secondary | ICD-10-CM | POA: Insufficient documentation

## 2020-08-15 DIAGNOSIS — Z9049 Acquired absence of other specified parts of digestive tract: Secondary | ICD-10-CM | POA: Insufficient documentation

## 2020-08-15 DIAGNOSIS — R1030 Lower abdominal pain, unspecified: Secondary | ICD-10-CM

## 2020-08-15 DIAGNOSIS — Z86718 Personal history of other venous thrombosis and embolism: Secondary | ICD-10-CM | POA: Insufficient documentation

## 2020-08-15 DIAGNOSIS — I824Z3 Acute embolism and thrombosis of unspecified deep veins of distal lower extremity, bilateral: Secondary | ICD-10-CM | POA: Insufficient documentation

## 2020-08-15 DIAGNOSIS — I10 Essential (primary) hypertension: Secondary | ICD-10-CM | POA: Insufficient documentation

## 2020-08-15 DIAGNOSIS — R918 Other nonspecific abnormal finding of lung field: Secondary | ICD-10-CM | POA: Diagnosis not present

## 2020-08-15 DIAGNOSIS — Z7901 Long term (current) use of anticoagulants: Secondary | ICD-10-CM | POA: Insufficient documentation

## 2020-08-15 DIAGNOSIS — I2609 Other pulmonary embolism with acute cor pulmonale: Secondary | ICD-10-CM | POA: Insufficient documentation

## 2020-08-15 LAB — URINALYSIS, ROUTINE W REFLEX MICROSCOPIC
Bilirubin Urine: NEGATIVE
Glucose, UA: NEGATIVE mg/dL
Hgb urine dipstick: NEGATIVE
Ketones, ur: NEGATIVE mg/dL
Leukocytes,Ua: NEGATIVE
Nitrite: NEGATIVE
Protein, ur: NEGATIVE mg/dL
Specific Gravity, Urine: 1.005 (ref 1.005–1.030)
pH: 8.5 — ABNORMAL HIGH (ref 5.0–8.0)

## 2020-08-15 MED ORDER — MORPHINE SULFATE (PF) 4 MG/ML IV SOLN
4.0000 mg | Freq: Once | INTRAVENOUS | Status: DC
  Filled 2020-08-15: qty 1

## 2020-08-15 MED ORDER — SODIUM CHLORIDE 0.9 % IV BOLUS
1000.0000 mL | Freq: Once | INTRAVENOUS | Status: AC
  Administered 2020-08-15: 1000 mL via INTRAVENOUS

## 2020-08-15 MED ORDER — ONDANSETRON HCL 4 MG/2ML IJ SOLN
4.0000 mg | Freq: Once | INTRAMUSCULAR | Status: AC
  Administered 2020-08-15: 4 mg via INTRAVENOUS
  Filled 2020-08-15: qty 2

## 2020-08-15 MED ORDER — AMOXICILLIN-POT CLAVULANATE 875-125 MG PO TABS: 1.0000 | ORAL_TABLET | Freq: Two times a day (BID) | ORAL | 0 refills | Status: DC

## 2020-08-15 MED ORDER — IOHEXOL 350 MG/ML SOLN
100.0000 mL | Freq: Once | INTRAVENOUS | Status: AC | PRN
  Administered 2020-08-15: 100 mL via INTRAVENOUS

## 2020-08-15 NOTE — ED Notes (Signed)
Pt checked out AMA. 

## 2020-08-15 NOTE — ED Triage Notes (Signed)
Reports generalized abdominal pain for the last month and a half that's worse after eating.  Denies any n/v/d.  Endorses pain worse with eating or drinking.

## 2020-08-15 NOTE — Telephone Encounter (Signed)
Pt's daughter is requesting a call back from a nurse, caller would like to know if it is still necessary for the pt to have a colonoscopy since the pt is having an anoscopy done for a rectal mass that was found.

## 2020-08-15 NOTE — Discharge Instructions (Signed)
Your CT scan was concerning for a rectal mass.  Typically this gets further evaluated in the office by the gastroenterologist.  Sometimes the general surgeons would see for this and perform anoscopy.  Please follow-up with your family doctor return for worsening abdominal pain or if you develop a fever.

## 2020-08-15 NOTE — ED Provider Notes (Signed)
[facility] EMERGENCY DEPARTMENT Provider Note   CSN: [id number] Arrival date & time: 08/15/20  [id number]     History Chief Complaint  Patient presents with  . Abdominal Pain    [name] [name] is a 75 y.o. male.  75 yo M with a chief complaints of diffuse abdominal pain.  This is worse to the lower abdomen.  Usually is worse with eating and drinking.  Has not wanted to really eat and drink because it makes his symptoms worse.  Has gotten much worse over the past week not really eating and drinking for the past couple days.  No fevers no vomiting has had some loose stools but denies diarrhea.  Denies urinary symptoms.  The patient went to [facility] yesterday but left prior to being seen.  Symptoms have persisted so came here today.  The history is provided by the patient.  Abdominal Pain Pain location:  Epigastric Pain quality: sharp and shooting   Pain radiates to:  Back Pain severity:  Moderate Onset quality:  Gradual Duration:  2 days Timing:  Constant Chronicity:  New Relieved by:  Nothing Worsened by:  Nothing Ineffective treatments:  None tried Associated symptoms: no chest pain, no chills, no diarrhea, no fever, no shortness of breath and no vomiting        Past Medical History:  Diagnosis Date  . Colon polyp   . GERD (gastroesophageal reflux disease)   . Hemorrhoid   . Hyperplasia of prostate   . Hypertension   . Other and unspecified hyperlipidemia   . Other testicular hypofunction   . Pneumonia   . Pulmonary embolism ([place])   . Sinus bradycardia     Patient Active Problem List   Diagnosis Date Noted  . Pulmonary embolism, bilateral ([place]) 08/15/2020  . Acute deep vein thrombosis (DVT) of distal vein of both lower extremities ([place]) 08/15/2020  . History of pulmonary embolus (PE) 05/31/2020  . Dyslipidemia 05/31/2020  . Aortic atherosclerosis ([place]) 03/07/2017  . B12 deficiency 12/31/2015  . Hyperlipidemia 07/01/2014  . Erectile dysfunction  02/28/2014  . Vitamin D deficiency 12/14/2012  . Testosterone deficiency   . Hypertension, essential   . Sinus bradycardia   . GERD (gastroesophageal reflux disease)   . Colon polyp history   . BPH (benign prostatic hyperplasia)     Past Surgical History:  Procedure Laterality Date  . APPENDECTOMY    . HAND SURGERY Right        Family History  Problem Relation Age of Onset  . Heart attack Mother   . Stroke Mother   . Diabetes Father   . Diabetes Daughter     Social History   Tobacco Use  . Smoking status: Former Smoker    Types: Cigars    Quit date: 2015    Years since quitting: 7.0  . Smokeless tobacco: Current User    Types: Chew  . Tobacco comment: only 1 cigar on occassion  Vaping Use  . Vaping Use: Never used  Substance Use Topics  . Alcohol use: Yes    Comment: occasional  . Drug use: No    Home Medications Prior to Admission medications   Medication Sig Start Date End Date Taking? Authorizing Provider  amoxicillin-clavulanate (AUGMENTIN) 875-125 MG tablet Take 1 tablet by mouth 2 (two) times daily. One po bid x 7 days 08/15/20  Yes Deno Etienne, DO  apixaban (ELIQUIS) 5 MG TABS tablet Take 1 tablet (5 mg total) by mouth 2 (two) times daily.  Start after finishing 1 wk of 10 mg bid. 08/10/20  Yes Dettinger, Fransisca Kaufmann, MD  aspirin 81 MG tablet Take 81 mg by mouth daily.   Yes [provider]  benazepril (LOTENSIN) 10 MG tablet Take 1 tablet (10 mg total) by mouth daily. 04/12/20  Yes Dettinger, Fransisca Kaufmann, MD  Cholecalciferol (VITAMIN D-3) 5000 UNITS TABS Take 5,000 Units by mouth daily.   Yes [provider]  Garlic 5427 MG CAPS Take 2 capsules by mouth daily.   Yes [provider]  Omega-3 Fatty Acids (FISH OIL) 1000 MG CAPS Take 3 capsules (3,000 mg total) by mouth daily. 06/02/20  Yes Antonieta Pert, MD  omeprazole (PRILOSEC) 20 MG capsule Take 1 capsule (20 mg total) by mouth daily. 04/12/20  Yes Dettinger, Fransisca Kaufmann, MD  pravastatin  (PRAVACHOL) 40 MG tablet Take 1 tablet (40 mg total) by mouth daily. 04/12/20  Yes Dettinger, Fransisca Kaufmann, MD  vitamin C (ASCORBIC ACID) 500 MG tablet Take 500 mg by mouth daily.   Yes [provider]  albuterol (VENTOLIN HFA) 108 (90 Base) MCG/ACT inhaler Inhale 2 puffs into the lungs every 6 (six) hours as needed for shortness of breath. 05/14/20   [provider]  NON FORMULARY Take 1 tablet by mouth daily. Equate allergy tablet    [provider]  tamsulosin (FLOMAX) 0.4 MG CAPS capsule Take 1 capsule (0.4 mg total) by mouth daily. 08/10/20   Dettinger, Fransisca Kaufmann, MD  amLODipine (NORVASC) 5 MG tablet Take 1 tablet (5 mg total) by mouth daily. Patient not taking: Reported on 05/31/2020 04/12/20 05/31/20  Dettinger, Fransisca Kaufmann, MD    Allergies    Cialis [tadalafil] and Viagra [sildenafil citrate]  Review of Systems   Review of Systems  Constitutional: Negative for chills and fever.  HENT: Negative for congestion and facial swelling.   Eyes: Negative for discharge and visual disturbance.  Respiratory: Negative for shortness of breath.   Cardiovascular: Negative for chest pain and palpitations.  Gastrointestinal: Positive for abdominal pain. Negative for diarrhea and vomiting.  Musculoskeletal: Negative for arthralgias and myalgias.  Skin: Negative for color change and rash.  Neurological: Negative for tremors, syncope and headaches.  Psychiatric/Behavioral: Negative for confusion and dysphoric mood.    Physical Exam Updated Vital Signs BP (!) 160/77 (BP Location: Right Arm)   Pulse (!) 57   Temp 97.9 F (36.6 C) (Oral)   Resp 18   Ht 5\' 7"  (1.702 m)   Wt 108.9 kg   SpO2 95%   BMI 37.59 kg/m   Physical Exam Vitals and nursing note reviewed.  Constitutional:      Appearance: He is well-developed and well-nourished.  HENT:     Head: Normocephalic and atraumatic.  Eyes:     Extraocular Movements: EOM normal.     Pupils: Pupils are equal, round, and reactive  to light.  Neck:     Vascular: No JVD.  Cardiovascular:     Rate and Rhythm: Normal rate and regular rhythm.     Heart sounds: No murmur heard. No friction rub. No gallop.   Pulmonary:     Effort: No respiratory distress.     Breath sounds: No wheezing.  Abdominal:     General: There is no distension.     Tenderness: There is no abdominal tenderness. There is no guarding or rebound.     Comments: No appreciable abdominal tenderness.  Patient points to the diffuse lower aspect of his abdomen as area of pain.  Musculoskeletal:        General: Normal range of motion.     Cervical back: Normal range of motion and neck supple.  Skin:    Coloration: Skin is not pale.     Findings: No rash.  Neurological:     Mental Status: He is alert and oriented to person, place, and time.  Psychiatric:        Mood and Affect: Mood and affect normal.        Behavior: Behavior normal.     ED Results / Procedures / Treatments   Labs (all labs ordered are listed, but only abnormal results are displayed) Labs Reviewed  URINALYSIS, ROUTINE W REFLEX MICROSCOPIC - Abnormal; Notable for the following components:      Result Value   pH 8.5 (*)    All other components within normal limits    EKG EKG Interpretation  Date/Time:  Tuesday August 15 2020 09:34:27 EST Ventricular Rate:  71 PR Interval:    QRS Duration: 86 QT Interval:  444 QTC Calculation: 433 R Axis:   33 Text Interpretation: Sinus rhythm Multiform ventricular premature complexes No significant change since last tracing Confirmed by Deno Etienne [phone]) on 08/15/2020 10:18:07 AM   Radiology CT Angio Chest PE W and/or Wo Contrast  Result Date: 08/15/2020 CLINICAL DATA:  PE suspected, high probability. Abdominal pain after eating x1 0.5 months. EXAM: CT ANGIOGRAPHY CHEST WITH CONTRAST TECHNIQUE: Multidetector CT imaging of the chest was performed using the standard protocol during bolus administration of intravenous contrast.  Multiplanar CT image reconstructions and MIPs were obtained to evaluate the vascular anatomy. CONTRAST:  169mL OMNIPAQUE IOHEXOL 350 MG/ML SOLN COMPARISON:  CTA chest May 31, 2020. FINDINGS: Cardiovascular: Satisfactory opacification of the pulmonary arteries to the segmental level. No evidence of acute pulmonary embolism. There is a tiny peripheral filling defect within in inter segmental branch of the left lower lobar pulmonary artery (series 4, image 43), consistent with chronic pulmonary embolus. Aortic atherosclerosis. Right-sided cardiac enlarged, decreased in comparison the prior. No pericardial effusion. Mediastinum/Nodes: No enlarged mediastinal, hilar, or axillary lymph nodes. Thyroid gland, trachea, and esophagus demonstrate no significant findings. Lungs/Pleura: Bibasilar atelectasis. No focal consolidation. No suspicious pulmonary nodules or masses. No pleural effusion or pneumothorax. Upper Abdomen: Please refer to same day CTA abdomen and pelvis for findings below the diaphragm. Musculoskeletal: Thoracic spondylosis. No suspicious lytic or blastic lesions of bone. Review of the MIP images confirms the above findings. IMPRESSION: 1. No evidence of acute pulmonary embolism. 2. Tiny peripheral filling defect within an inter-segmental branch of the left lower lobar pulmonary artery, consistent with chronic pulmonary embolus. 3. Right-sided cardiac enlarged, which is decreased in comparison the prior. 4. Aortic atherosclerosis. Aortic Atherosclerosis (ICD10-I70.0). Electronically Signed   By: Dahlia Bailiff MD   On: 08/15/2020 11:51   CT Angio Abd/Pel W and/or Wo Contrast  Result Date: 08/15/2020 CLINICAL DATA:  Acute mesenteric ischemia Generalized abdominal pain for the last month and a half which is worse after eating EXAM: CTA ABDOMEN AND PELVIS WITHOUT AND WITH CONTRAST TECHNIQUE: Multidetector CT imaging of the abdomen and pelvis was performed using the standard protocol during bolus  administration of intravenous contrast. Multiplanar reconstructed images and MIPs were obtained and reviewed to evaluate the vascular anatomy. CONTRAST:  161mL OMNIPAQUE IOHEXOL 350 MG/ML SOLN COMPARISON:  CT abdomen pelvis 01/05/2016 FINDINGS: VASCULAR Aorta: Scattered atherosclerotic calcifications without aneurysmal dilatation or flow-limiting stenosis. Celiac: Patent without evidence of aneurysm, dissection, vasculitis or significant stenosis. SMA: Patent  without evidence of aneurysm, dissection, vasculitis or significant stenosis. Renals: Mild narrowing of the proximal left main renal artery. No significant narrowing of the right main renal artery. IMA: Patent.  Mild to moderate stenosis at the origin. Inflow: Patent without evidence of aneurysm, dissection, vasculitis or significant stenosis. Proximal Outflow: Bilateral common femoral and visualized portions of the superficial and profunda femoral arteries are patent without evidence of aneurysm, dissection, vasculitis or significant stenosis. Veins: No significant abnormality of the hepatic, portal, splenic, or superior mesenteric veins. Review of the MIP images confirms the above findings. NON-VASCULAR Lower chest: Minimal bibasilar atelectasis. Small embolus noted in the left lower lobe pulmonary artery. Hepatobiliary: No focal liver abnormality is seen. No gallstones, gallbladder wall thickening, or biliary dilatation. Pancreas: Unremarkable. No pancreatic ductal dilatation or surrounding inflammatory changes. Spleen: Normal in size without focal abnormality. Adrenals/Urinary Tract: Adrenal glands are unremarkable. Kidneys are normal, without renal calculi, focal lesion, or hydronephrosis. Bladder is unremarkable. Stomach/Bowel: Small ovoid structure adjacent to the distal descending colon which is centrally fatty in has a calcified rim is likely a previously torsed epiploic appendage. The wall of the rectum is thickened. This may be due to under  distension, however correlation with direct visualization should be performed to exclude underlying mass. Lymphatic: No enlarged abdominal or pelvic lymph nodes. Reproductive: Mildly enlarged prostate Other: No abdominal wall hernia or abnormality. No abdominopelvic ascites. Musculoskeletal: Degenerative changes seen throughout the lumbar spine. IMPRESSION: VASCULAR 1. No significant abnormality of the metastatic arteries. 2. No significant abnormality of the mastoid veins. NON-VASCULAR 1. Nonocclusive linear thrombus noted in the left lower lobe pulmonary artery. Further evaluation with CT angiography of the chest should be considered. 2. Thickening of the wall the rectum may be due to under distension, however further evaluation with direct visualization is recommended to exclude underlying mass. These results were called by telephone at the time of interpretation on 08/15/2020 at 11:14 am to provider Jeris Roser , who verbally acknowledged these results. Electronically Signed   By: Miachel Roux M.D.   On: 08/15/2020 11:17    Procedures Procedures (including critical care time)  Medications Ordered in ED Medications  morphine 4 MG/ML injection 4 mg (4 mg Intravenous Patient Refused/Not Given 08/15/20 1008)  ondansetron (ZOFRAN) injection 4 mg (4 mg Intravenous Given 08/15/20 0959)  sodium chloride 0.9 % bolus 1,000 mL (0 mLs Intravenous Stopped 08/15/20 1311)  iohexol (OMNIPAQUE) 350 MG/ML injection 100 mL (100 mLs Intravenous Contrast Given 08/15/20 1021)  iohexol (OMNIPAQUE) 350 MG/ML injection 100 mL (100 mLs Intravenous Contrast Given 08/15/20 1127)    ED Course  I have reviewed the triage vital signs and the nursing notes.  Pertinent labs & imaging results that were available during my care of the patient were reviewed by me and considered in my medical decision making (see chart for details).    MDM Rules/Calculators/A&P                          75 yo M with a chief complaints of abdominal  pain.  This is worse with anytime he eats or drinks and seems to be localized to the lower portion of his abdomen.  He seems to have some aversion to eating and drinking.  We will obtain a CT angiogram to assess for mesenteric ischemia.  Treat pain and nausea.  I reviewed his lab work from yesterday that is largely unremarkable.  No significant leukocytosis no LFT elevation lipase is normal.  No significant change in his renal function.  CT angiogram of the abdomen pelvis was negative for mesenteric ischemia.  There was some concern for rectal wall thickening which could be underlying cancer.  There was also incidentally noted pulmonary embolism.  By recommendation of the radiologist who performed a CT angiogram of the chest which showed what appeared to be chronic pulmonary embolism.  We will discharge the patient home.  I will treat him for antibiotics for possible proctitis.  Have him follow-up with GI or general surgery.  2:59 PM:  I have discussed the diagnosis/risks/treatment options with the patient and believe the pt to be eligible for discharge home to follow-up with PCP. We also discussed returning to the ED immediately if new or worsening sx occur. We discussed the sx which are most concerning (e.g., sudden worsening pain, fever, inability to tolerate by mouth) that necessitate immediate return. Medications administered to the patient during their visit and any new prescriptions provided to the patient are listed below.  Medications given during this visit Medications  morphine 4 MG/ML injection 4 mg (4 mg Intravenous Patient Refused/Not Given 08/15/20 1008)  ondansetron (ZOFRAN) injection 4 mg (4 mg Intravenous Given 08/15/20 0959)  sodium chloride 0.9 % bolus 1,000 mL (0 mLs Intravenous Stopped 08/15/20 1311)  iohexol (OMNIPAQUE) 350 MG/ML injection 100 mL (100 mLs Intravenous Contrast Given 08/15/20 1021)  iohexol (OMNIPAQUE) 350 MG/ML injection 100 mL (100 mLs Intravenous Contrast Given  08/15/20 1127)     The patient appears reasonably screen and/or stabilized for discharge and I doubt any other medical condition or other [facility] requiring further screening, evaluation, or treatment in the ED at this time prior to discharge.   Final Clinical Impression(s) / ED Diagnoses Final diagnoses:  Lower abdominal pain    Rx / DC Orders ED Discharge Orders         Ordered    amoxicillin-clavulanate (AUGMENTIN) 875-125 MG tablet  2 times daily        08/15/20 [place], [place], DO 08/15/20 1459

## 2020-08-15 NOTE — Telephone Encounter (Signed)
Patient with possible rectal mass. He had CT scan and was told to follow up with [facility].  Patient is due for colonoscopy , but on Eliquis. He will come in on 08/29/20 with Amy Esterwood PA to discuss.  Patient's daughter asking if patient needs appointment with [place] ans [facility].  She is encouraged to keep any appointment that was recommended for him by other MDs

## 2020-08-29 ENCOUNTER — Ambulatory Visit: Payer: Medicare HMO | Admitting: Physician Assistant

## 2020-08-29 ENCOUNTER — Telehealth: Payer: Self-pay

## 2020-08-29 ENCOUNTER — Encounter: Payer: Self-pay | Admitting: Physician Assistant

## 2020-08-29 VITALS — BP 132/80 | HR 76 | Ht 66.0 in | Wt 250.0 lb

## 2020-08-29 DIAGNOSIS — Z1211 Encounter for screening for malignant neoplasm of colon: Secondary | ICD-10-CM

## 2020-08-29 DIAGNOSIS — R933 Abnormal findings on diagnostic imaging of other parts of digestive tract: Secondary | ICD-10-CM | POA: Diagnosis not present

## 2020-08-29 DIAGNOSIS — Z7901 Long term (current) use of anticoagulants: Secondary | ICD-10-CM

## 2020-08-29 NOTE — Patient Instructions (Addendum)
If you are age 77 or older, your body mass index should be between 23-30. Your Body mass index is 40.35 kg/m. If this is out of the aforementioned range listed, please consider follow up with your Primary Care Provider.  If you are age 58 or younger, your body mass index should be between 19-25. Your Body mass index is 40.35 kg/m. If this is out of the aformentioned range listed, please consider follow up with your Primary Care Provider.   You have been scheduled for a colonoscopy. Please follow written instructions given to you at your visit today.  Please pick up your prep supplies at the pharmacy within the next 1-3 days. If you use inhalers (even only as needed), please bring them with you on the day of your procedure.  You will be contacted by our office prior to your procedure for directions on holding your Eliquis.  If you do not hear from our office 1 week prior to your scheduled procedure, please call [phone] to discuss.   Follow up pending at this time.  Thank you for entrusting me with your care and choosing [facility].  Amy Esterwood, PA-C

## 2020-08-29 NOTE — Telephone Encounter (Signed)
[facility] Pre-operative Risk Assessment     Request for surgical clearance:     Endoscopy Procedure  What type of surgery is being performed?     Colonoscopy  When is this surgery scheduled?     10/17/2020  What type of clearance is required ?   Pharmacy  Are there any medications that need to be held prior to surgery and how long? Eliquis starting 2 days prior  Practice name and name of physician performing surgery?      [facility]  What is your office phone and fax number?      Phone- [phone]  [phone]  Anesthesia type (None, local, MAC, general) ?       MAC

## 2020-08-29 NOTE — Progress Notes (Signed)
Subjective:    Patient ID: [name] [name], male    DOB: 11-28-1945, 75 y.o.   MRN: [id number]  HPI  [name] [name] is a 75 year old white male, known remotely to Dr. Carlean Purl from colonoscopy in 2011 who comes in today to discuss colonoscopy.  He says he received a recall letter.  He also had a very recent ER visit on 08/15/2020 with complaints of mid to lower abdominal pain.  At that time he had complained of abdominal pain postprandially over the previous weeks.  He underwent CT scan of the abdomen and pelvis with angiography and also CT angio of the chest.  There was no evidence of pulmonary embolus and on CT angio of the abdomen the SMA, IMA and celiac were all patent, he was noted to have a small ovoid structure adjacent to the distal descending colon which has a calcified rim and is likely a previously torsed epiploic appendage.  The wall of the rectum was also noted to be thickened unclear whether this was due to under distention versus underlying mass.  No pelvic adenopathy, no abdominal wall hernia or abnormality. Patient says that pain has resolved and he has not been having any difficulty over the past couple of weeks.  He denies any difficulty with bowel movements, no changes in bowel movements other than some straining, no melena or hematochezia.  No complaints of anorectal pain.  He says around the time of that ER visit he had some bad mid abdominal pain which lasted for a couple of days and then resolved without any fever chills nausea vomiting etc. Patient was diagnosed with COVID-19 in October 2022.  He was then hospitalized on 05/31/2021 with complaints of exertional dyspnea over the previous weeks worsened over a couple of days.  He was diagnosed with an acute pulmonary embolism with right heart strain/submassive PE and also had a right lower extremity DVT.  He is now on Eliquis. He did have follow-up with with our Murvin Donning, MD in December.  Plan was for anticoagulation for 3 to 6  months. Other medical problems include hypertension, GERD, and BPH. Last colonoscopy in 2011 was normal with the exception of a hypertrophied anal papillae.  Patient does have prior history of adenomatous colon polyps remotely in 2002 and 2007  Review of Systems Pertinent positive and negative review of systems were noted in the above HPI section.  All other review of systems was otherwise negative.  Outpatient Encounter Medications as of 08/29/2020  Medication Sig  . aspirin 81 MG tablet Take 81 mg by mouth daily.  Marland Kitchen b complex vitamins capsule Take 1 capsule by mouth daily.  . benazepril (LOTENSIN) 10 MG tablet Take 1 tablet (10 mg total) by mouth daily.  . Cholecalciferol (VITAMIN D-3) 5000 UNITS TABS Take 5,000 Units by mouth daily.  . Garlic 3532 MG CAPS Take 2 capsules by mouth daily.  . Omega-3 Fatty Acids (FISH OIL) 1000 MG CAPS Take 3 capsules (3,000 mg total) by mouth daily.  Marland Kitchen omeprazole (PRILOSEC) 20 MG capsule Take 1 capsule (20 mg total) by mouth daily.  . pravastatin (PRAVACHOL) 40 MG tablet Take 1 tablet (40 mg total) by mouth daily.  . vitamin C (ASCORBIC ACID) 500 MG tablet Take 500 mg by mouth daily.  . [DISCONTINUED] apixaban (ELIQUIS) 5 MG TABS tablet Take 1 tablet (5 mg total) by mouth 2 (two) times daily. Start after finishing 1 wk of 10 mg bid.  Marland Kitchen ELIQUIS DVT/PE STARTER PACK Take 1 tablet by  mouth in the morning and at bedtime.  . NON FORMULARY Take 1 tablet by mouth daily. Equate allergy tablet  . [DISCONTINUED] albuterol (VENTOLIN HFA) 108 (90 Base) MCG/ACT inhaler Inhale 2 puffs into the lungs every 6 (six) hours as needed for shortness of breath.  . [DISCONTINUED] amLODipine (NORVASC) 5 MG tablet Take 1 tablet (5 mg total) by mouth daily. (Patient not taking: Reported on 05/31/2020)  . [DISCONTINUED] amoxicillin-clavulanate (AUGMENTIN) 875-125 MG tablet Take 1 tablet by mouth 2 (two) times daily. One po bid x 7 days  . [DISCONTINUED] tamsulosin (FLOMAX) 0.4 MG CAPS  capsule Take 1 capsule (0.4 mg total) by mouth daily.   No facility-administered encounter medications on file as of 08/29/2020.   Allergies  Allergen Reactions  . Cialis [Tadalafil]   . Viagra [Sildenafil Citrate] Other (See Comments)    heartburn   Patient Active Problem List   Diagnosis Date Noted  . Pulmonary embolism, bilateral (HCC) 08/15/2020  . Acute deep vein thrombosis (DVT) of distal vein of both lower extremities (HCC) 08/15/2020  . History of pulmonary embolus (PE) 05/31/2020  . Dyslipidemia 05/31/2020  . Aortic atherosclerosis (HCC) 03/07/2017  . B12 deficiency 12/31/2015  . Hyperlipidemia 07/01/2014  . Erectile dysfunction 02/28/2014  . Vitamin D deficiency 12/14/2012  . Testosterone deficiency   . Hypertension, essential   . Sinus bradycardia   . GERD (gastroesophageal reflux disease)   . Colon polyp history   . BPH (benign prostatic hyperplasia)    Social History   Socioeconomic History  . Marital status: Married    Spouse name: Cathleen  . Number of children: 3  . Years of education: 10  . Highest education level: 10th grade  Occupational History  . Occupation: retired  Tobacco Use  . Smoking status: Former Smoker    Types: Cigars    Quit date: 2015    Years since quitting: 7.0  . Smokeless tobacco: Current User    Types: Chew  . Tobacco comment: only 1 cigar on occassion  Vaping Use  . Vaping Use: Never used  Substance and Sexual Activity  . Alcohol use: Yes    Comment: occasional  . Drug use: No  . Sexual activity: Not Currently  Other Topics Concern  . Not on file  Social History Narrative  . Not on file   Social Determinants of Health   Financial Resource Strain: Not on file  Food Insecurity: Not on file  Transportation Needs: Not on file  Physical Activity: Not on file  Stress: Not on file  Social Connections: Not on file  Intimate Partner Violence: Not on file    [name] [name]'s family history includes Diabetes in his daughter  and father; Heart attack in his mother; Stroke in his mother.      Objective:    Vitals:   08/29/20 1327  BP: 132/80  Pulse: 76    Physical Exam Well-developed well-nourished  Elderly WM  in no acute distress.  Height, Weight, BMI 40.35  HEENT; nontraumatic normocephalic, EOMI, PE R LA, sclera anicteric. Oropharynx;not examined Neck; supple, no JVD Cardiovascular; regular rate and rhythm with S1-S2, no murmur rub or gallop Pulmonary; Clear bilaterally Abdomen; soft, obese  mildl tenderness with deep palpation LLQ nondistended, no palpable mass or hepatosplenomegaly, bowel sounds are active Rectal; not done today  Skin; benign exam, no jaundice rash or appreciable lesions Extremities; no clubbing cyanosis or edema skin warm and dry Neuro/Psych; alert and oriented x4, grossly nonfocal mood and affect appropriate  Assessment & Plan:    #70  75 year old white male due for follow-up screening colonoscopy with very remote history of adenomatous colon polyps, and negative colonoscopy 2011. He had recent ER visit on 08/15/2020 with complaints of abdominal pain which has since resolved.  CT imaging at that time including angiography showed patent mesenteric vessels, thickened rectal wall rule out under distention versus underlying mass and a small ovoid structure adjacent to the distal descending colon with a calcified rim suspected to be a torsed previous epiploic appendage.  #2 anticoagulation-on Eliquis with fairly recent DVT/PE, provoked November 2021 in setting of COVID-19 pneumonia initially diagnosed October 2021 Plan per pulmonary notes for anticoagulation for 3 to 6 months  #3 very recent CT imaging of the chest done 08/16/2019 shows no acute PE, and tiny peripheral filling defect in the left lower lobar pulmonary artery consistent with chronic pulmonary embolus, there was decreasing right-sided cardiac enlargement.  #4 hypertension 5.  BPH  Plan; patient will be scheduled  for colonoscopy with Dr. Carlean Purl.  Procedure was discussed in detail with patient including indications risks and benefits and he is agreeable to proceed.  We will intentionally schedule this for the end of March so that he will be at least 4 months out from PE. We will communicate with with our pulmonary/Dr. Rodman Pickle regarding holding Eliquis for 24 to 48 hours preprocedure and assure this is reasonable for this patient.  If pulmonary prefers that he wait a full 6 months prior to interrupting Eliquis then colonoscopy can be rescheduled.  This was discussed with patient and his daughter today. Patient is status post recent Covid infection, has not been vaccinated and will test preprocedure.  Nakeisha Greenhouse Genia Harold PA-C 08/29/2020   Cc: Dettinger, Fransisca Kaufmann, MD

## 2020-09-04 NOTE — Telephone Encounter (Signed)
Please fax pre-op letter located in Communications to [facility].

## 2020-09-04 NOTE — Telephone Encounter (Signed)
Letter has been printed and faxed to [facility].

## 2020-09-05 DIAGNOSIS — H52209 Unspecified astigmatism, unspecified eye: Secondary | ICD-10-CM | POA: Diagnosis not present

## 2020-09-05 DIAGNOSIS — H5203 Hypermetropia, bilateral: Secondary | ICD-10-CM | POA: Diagnosis not present

## 2020-09-05 DIAGNOSIS — H524 Presbyopia: Secondary | ICD-10-CM | POA: Diagnosis not present

## 2020-09-13 ENCOUNTER — Telehealth: Payer: Self-pay | Admitting: Family Medicine

## 2020-09-14 NOTE — Telephone Encounter (Signed)
That is correct. His PCP is managing his anticoagulation as stated in my last clinic note on 07/04/20. From a pulmonary standpoint, his risks for non-thoracic procedure is low for respiratory complications.  Rodman Pickle, M.D. [facility] 09/14/2020 4:47 PM

## 2020-09-14 NOTE — Telephone Encounter (Signed)
I seen in the patient's chart that you have printed a letter and faxed it tour our office. It was not received here, but I did review the letter in the patient's chart and did not see any mention if he is okay to stop the Eliquis 2 days prior to his procedure.

## 2020-09-14 NOTE — Telephone Encounter (Signed)
Please see message below and advise if we may hold patient's Eliquis for 2 days.

## 2020-09-15 ENCOUNTER — Emergency Department (HOSPITAL_COMMUNITY)
Admission: EM | Admit: 2020-09-15 | Discharge: 2020-09-16 | Disposition: A | Discharge status: Home / Self Care | Payer: Medicare HMO | Attending: Emergency Medicine | Admitting: Emergency Medicine

## 2020-09-15 DIAGNOSIS — W19XXXA Unspecified fall, initial encounter: Secondary | ICD-10-CM | POA: Diagnosis not present

## 2020-09-15 DIAGNOSIS — Z7982 Long term (current) use of aspirin: Secondary | ICD-10-CM | POA: Insufficient documentation

## 2020-09-15 DIAGNOSIS — Z87891 Personal history of nicotine dependence: Secondary | ICD-10-CM | POA: Insufficient documentation

## 2020-09-15 DIAGNOSIS — Z86711 Personal history of pulmonary embolism: Secondary | ICD-10-CM | POA: Insufficient documentation

## 2020-09-15 DIAGNOSIS — S060X0A Concussion without loss of consciousness, initial encounter: Secondary | ICD-10-CM | POA: Diagnosis not present

## 2020-09-15 DIAGNOSIS — Z7901 Long term (current) use of anticoagulants: Secondary | ICD-10-CM | POA: Diagnosis not present

## 2020-09-15 DIAGNOSIS — W01198A Fall on same level from slipping, tripping and stumbling with subsequent striking against other object, initial encounter: Secondary | ICD-10-CM | POA: Diagnosis not present

## 2020-09-15 DIAGNOSIS — Z86718 Personal history of other venous thrombosis and embolism: Secondary | ICD-10-CM | POA: Insufficient documentation

## 2020-09-15 DIAGNOSIS — Z79899 Other long term (current) drug therapy: Secondary | ICD-10-CM | POA: Diagnosis not present

## 2020-09-15 DIAGNOSIS — S01112A Laceration without foreign body of left eyelid and periocular area, initial encounter: Secondary | ICD-10-CM | POA: Diagnosis not present

## 2020-09-15 DIAGNOSIS — S61412A Laceration without foreign body of left hand, initial encounter: Secondary | ICD-10-CM | POA: Diagnosis not present

## 2020-09-15 DIAGNOSIS — R220 Localized swelling, mass and lump, head: Secondary | ICD-10-CM | POA: Diagnosis not present

## 2020-09-15 DIAGNOSIS — S60512A Abrasion of left hand, initial encounter: Secondary | ICD-10-CM | POA: Diagnosis not present

## 2020-09-15 DIAGNOSIS — F1019 Alcohol abuse with unspecified alcohol-induced disorder: Secondary | ICD-10-CM | POA: Diagnosis not present

## 2020-09-15 DIAGNOSIS — I10 Essential (primary) hypertension: Secondary | ICD-10-CM | POA: Diagnosis not present

## 2020-09-15 DIAGNOSIS — Z043 Encounter for examination and observation following other accident: Secondary | ICD-10-CM | POA: Diagnosis not present

## 2020-09-15 DIAGNOSIS — S0990XA Unspecified injury of head, initial encounter: Secondary | ICD-10-CM | POA: Diagnosis present

## 2020-09-15 DIAGNOSIS — R58 Hemorrhage, not elsewhere classified: Secondary | ICD-10-CM | POA: Diagnosis not present

## 2020-09-15 NOTE — Telephone Encounter (Signed)
It has been 4 months since patient had a submassive PE, I usually prefer to wait 6 months for any procedures and coming off of anticoagulation, but if his pulmonologist feel that it is alright to proceed after such a PE then I am fine with proceeding and coming off for 2 days and restarting 1 day after the procedure

## 2020-09-15 NOTE — Telephone Encounter (Signed)
Dr. Carlean Purl, this patient is scheduled with you to have a Colonoscopy on 10/17/2020. I want to make sure you are okay with this patient to have the procedure and hold his Eliquis for 2 days. Please see below.

## 2020-09-15 NOTE — Telephone Encounter (Signed)
Chart and messages reviewed. Will plan to move forward with procedure and holding Eliquis x 2 days.  Thanks for the attention to detail.

## 2020-09-15 NOTE — ED Triage Notes (Signed)
[facility] brought pt in for a fall. Pt had been drinking moonshine and fell into woodstove. Pt has L eyebrow laceration, L wrist skintear, and swelling of L wrist. No LOC. Pt on Eliquis. Pt alert and oriented x 4.

## 2020-09-16 ENCOUNTER — Emergency Department (HOSPITAL_COMMUNITY): Payer: Medicare HMO

## 2020-09-16 ENCOUNTER — Other Ambulatory Visit: Payer: Self-pay

## 2020-09-16 ENCOUNTER — Encounter (HOSPITAL_COMMUNITY): Payer: Self-pay

## 2020-09-16 DIAGNOSIS — R220 Localized swelling, mass and lump, head: Secondary | ICD-10-CM | POA: Diagnosis not present

## 2020-09-16 DIAGNOSIS — S61412A Laceration without foreign body of left hand, initial encounter: Secondary | ICD-10-CM | POA: Diagnosis not present

## 2020-09-16 DIAGNOSIS — Z043 Encounter for examination and observation following other accident: Secondary | ICD-10-CM | POA: Diagnosis not present

## 2020-09-16 MED ORDER — LIDOCAINE HCL (PF) 1 % IJ SOLN
INTRAMUSCULAR | Status: AC
  Filled 2020-09-16: qty 30

## 2020-09-16 NOTE — ED Provider Notes (Signed)
[facility] EMERGENCY DEPARTMENT Provider Note   CSN: [id number] Arrival date & time: 09/15/20  2352     History Chief Complaint  Patient presents with  . Fall    [name] [name] is a 74 y.o. male.  The history is provided by the patient and a relative.  Fall This is a new problem. The current episode started less than 1 hour ago. The problem occurs constantly. The problem has not changed since onset.Associated symptoms include headaches. Pertinent negatives include no chest pain, no abdominal pain and no shortness of breath. Nothing aggravates the symptoms. Nothing relieves the symptoms.   Patient with history of hypertension, pulmonary embolism on Eliquis presents after fall.  Patient reports drinking moonshine tonight and he fell into his stove.  He injured his head and sustained injury to his left hand and wrist  He denies any LOC.  He does have a headache.  No chest or abdominal pain.  No neck or back pain.  Past Medical History:  Diagnosis Date  . Colon polyp   . GERD (gastroesophageal reflux disease)   . Hemorrhoid   . Hyperplasia of prostate   . Hypertension   . Other and unspecified hyperlipidemia   . Other testicular hypofunction   . Pneumonia   . Pulmonary embolism ([place])   . Sinus bradycardia     Patient Active Problem List   Diagnosis Date Noted  . Pulmonary embolism, bilateral ([place]) 08/15/2020  . Acute deep vein thrombosis (DVT) of distal vein of both lower extremities ([place]) 08/15/2020  . History of pulmonary embolus (PE) 05/31/2020  . Dyslipidemia 05/31/2020  . Aortic atherosclerosis ([place]) 03/07/2017  . B12 deficiency 12/31/2015  . Hyperlipidemia 07/01/2014  . Erectile dysfunction 02/28/2014  . Vitamin D deficiency 12/14/2012  . Testosterone deficiency   . Hypertension, essential   . Sinus bradycardia   . GERD (gastroesophageal reflux disease)   . Colon polyp history   . BPH (benign prostatic hyperplasia)     Past Surgical History:  Procedure  Laterality Date  . APPENDECTOMY    . HAND SURGERY Right        Family History  Problem Relation Age of Onset  . Heart attack Mother   . Stroke Mother   . Diabetes Father   . Diabetes Daughter     Social History   Tobacco Use  . Smoking status: Former Smoker    Types: Cigars    Quit date: 2015    Years since quitting: 7.1  . Smokeless tobacco: Current User    Types: Chew  . Tobacco comment: only 1 cigar on occassion  Vaping Use  . Vaping Use: Never used  Substance Use Topics  . Alcohol use: Yes    Comment: daily  . Drug use: No    Home Medications Prior to Admission medications   Medication Sig Start Date End Date Taking? Authorizing Provider  aspirin 81 MG tablet Take 81 mg by mouth daily.    [provider]  b complex vitamins capsule Take 1 capsule by mouth daily.    [provider]  benazepril (LOTENSIN) 10 MG tablet Take 1 tablet (10 mg total) by mouth daily. 04/12/20   Dettinger, Fransisca Kaufmann, MD  Cholecalciferol (VITAMIN D-3) 5000 UNITS TABS Take 5,000 Units by mouth daily.    [provider]  Arne Cleveland DVT/PE STARTER PACK Take 1 tablet by mouth in the morning and at bedtime. 06/02/20   [provider]  Garlic 7416 MG CAPS Take 2 capsules  by mouth daily.    [provider]  NON FORMULARY Take 1 tablet by mouth daily. Equate allergy tablet    [provider]  Omega-3 Fatty Acids (FISH OIL) 1000 MG CAPS Take 3 capsules (3,000 mg total) by mouth daily. 06/02/20   Antonieta Pert, MD  omeprazole (PRILOSEC) 20 MG capsule Take 1 capsule (20 mg total) by mouth daily. 04/12/20   Dettinger, Fransisca Kaufmann, MD  pravastatin (PRAVACHOL) 40 MG tablet Take 1 tablet (40 mg total) by mouth daily. 04/12/20   Dettinger, Fransisca Kaufmann, MD  vitamin C (ASCORBIC ACID) 500 MG tablet Take 500 mg by mouth daily.    [provider]  amLODipine (NORVASC) 5 MG tablet Take 1 tablet (5 mg total) by mouth daily. Patient not taking: Reported on 05/31/2020  04/12/20 05/31/20  Dettinger, Fransisca Kaufmann, MD    Allergies    Cialis [tadalafil] and Viagra [sildenafil citrate]  Review of Systems   Review of Systems  Respiratory: Negative for shortness of breath.   Cardiovascular: Negative for chest pain.  Gastrointestinal: Negative for abdominal pain.  Musculoskeletal: Negative for back pain and neck pain.  Skin: Positive for wound.  Neurological: Positive for headaches.  All other systems reviewed and are negative.   Physical Exam Updated Vital Signs BP 122/68   Pulse 63   Temp 97.7 F (36.5 C)   Resp 18   Ht 1.676 m (5\' 6" )   Wt 113.4 kg   SpO2 95%   BMI 40.35 kg/m   Physical Exam CONSTITUTIONAL: Elderly, no acute distress HEAD: Laceration noted to left eyebrow.  No signs of head trauma EYES: EOMI/PERRL, left periorbital edema/bruising is noted.  No other signs of ocular trauma ENMT: Mucous membranes moist, no oral or nasal trauma NECK: supple no meningeal signs SPINE/BACK:entire spine nontender, no bruising/crepitance/stepoffs noted to spine  CV: S1/S2 noted LUNGS: Lungs are clear to auscultation bilaterally, no apparent distress Chest-no bruising or crepitus ABDOMEN: soft, nontender, no rebound or guarding, bowel sounds noted throughout abdomen, no bruising GU:no cva tenderness NEURO: Pt is awake/alert/appropriate, moves all extremitiesx4.  No facial droop.  GCS 15 EXTREMITIES: pulses normal/equal, full ROM, diffuse tenderness left hand.  Large skin tear noted.  Skin tear noted to the distal tip of left ring finger Nails intact without hematoma All other extremities/joints palpated/ranged and nontender SKIN: warm, color normal PSYCH: no abnormalities of mood noted, alert and oriented to situation           ED Results / Procedures / Treatments   Labs (all labs ordered are listed, but only abnormal results are displayed) Labs Reviewed - No data to display  EKG None  Radiology DG Wrist Complete Left  Result Date:  09/16/2020 CLINICAL DATA:  Fall EXAM: LEFT WRIST - COMPLETE 3+ VIEW COMPARISON:  None. FINDINGS: There is no evidence of fracture or dislocation. There is no evidence of arthropathy or other focal bone abnormality. Soft tissue swelling at the wrist with lateral laceration. No radiopaque foreign body. IMPRESSION: Lateral laceration without radiopaque foreign body or acute fracture or dislocation of the left wrist. Electronically Signed   By: Ulyses Jarred M.D.   On: 09/16/2020 01:23   CT Head Wo Contrast  Result Date: 09/16/2020 CLINICAL DATA:  Status post fall. EXAM: CT HEAD WITHOUT CONTRAST TECHNIQUE: Contiguous axial images were obtained from the base of the skull through the vertex without intravenous contrast. COMPARISON:  None. FINDINGS: Brain: There is mild cerebral atrophy with widening of the bifrontal extra-axial spaces and  ventricular dilatation. There are areas of decreased attenuation within the white matter tracts of the supratentorial brain, consistent with microvascular disease changes. Vascular: No hyperdense vessel or unexpected calcification. Skull: Normal. Negative for fracture or focal lesion. Sinuses/Orbits: A 0.9 cm x 1.1 cm sphenoid sinus polyp versus mucous retention cyst is seen. Other: Very mild left supra orbital soft tissue swelling is seen. IMPRESSION: 1. Left supra orbital soft tissue swelling without evidence of an acute fracture or acute intracranial abnormality. 2. Generalized cerebral atrophy. Electronically Signed   By: Virgina Norfolk M.D.   On: 09/16/2020 02:49   CT Cervical Spine Wo Contrast  Result Date: 09/16/2020 CLINICAL DATA:  Status post fall. EXAM: CT CERVICAL SPINE WITHOUT CONTRAST TECHNIQUE: Multidetector CT imaging of the cervical spine was performed without intravenous contrast. Multiplanar CT image reconstructions were also generated. COMPARISON:  None. FINDINGS: Alignment: Approximately 1 mm anterolisthesis of the C4 vertebral body is noted on C5. Skull  base and vertebrae: No acute fracture. No primary bone lesion or focal pathologic process. Soft tissues and spinal canal: No prevertebral fluid or swelling. No visible canal hematoma. Disc levels: Moderate severity endplate sclerosis is seen at the levels of C5-C6 and C6-C7. Marked severity intervertebral disc space narrowing is also seen at the levels of C5-C6 and C6-C7. Mild, bilateral multilevel facet joint hypertrophy is noted. Upper chest: Negative. Other: None. IMPRESSION: 1. Approximately 1 mm anterolisthesis of the C4 vertebral body on C5. 2. Marked severity degenerative changes at the levels of C5-C6 and C6-C7. 3. No acute cervical spine fracture. Electronically Signed   By: Virgina Norfolk M.D.   On: 09/16/2020 02:55   DG Hand Complete Left  Result Date: 09/16/2020 CLINICAL DATA:  Fall with hand lacerations EXAM: LEFT HAND - COMPLETE 3+ VIEW COMPARISON:  None. FINDINGS: Moderate soft tissue swelling of the left hand. No fracture or dislocation. No radiopaque foreign body. IMPRESSION: Moderate soft tissue swelling without fracture or dislocation. Electronically Signed   By: Ulyses Jarred M.D.   On: 09/16/2020 01:19    Procedures .Marland KitchenLaceration Repair  Date/Time: 09/16/2020 3:18 AM Performed by: Ripley Fraise, MD Authorized by: Ripley Fraise, MD   Consent:    Consent obtained:  Verbal   Consent given by:  Patient   Risks discussed:  Pain   Alternatives discussed:  No treatment Anesthesia:    Anesthesia method:  Local infiltration   Local anesthetic:  Lidocaine 1% w/o epi Laceration details:    Location:  Face   Face location:  L eyebrow   Length (cm):  3 Exploration:    Wound extent: no foreign bodies/material noted and no underlying fracture noted     Contaminated: no   Treatment:    Amount of cleaning:  Standard Skin repair:    Repair method:  Sutures   Suture size:  4-0   Wound skin closure material used: vicryl rapide.   Suture technique:  Simple interrupted    Number of sutures:  4 Approximation:    Approximation:  Close Repair type:    Repair type:  Simple Post-procedure details:    Procedure completion:  Tolerated well, no immediate complications     Medications Ordered in ED Medications  lidocaine (PF) (XYLOCAINE) 1 % injection (  Given by Other 09/16/20 0325)    ED Course  I have reviewed the triage vital signs and the nursing notes.  Pertinent   imaging results that were available during my care of the patient were reviewed by me and considered in my  medical decision making (see chart for details).    MDM Rules/Calculators/A&P                          Patient admits to alcohol abuse which caused him to fall into a stove.  Fortunately there is no injuries on CT head or C-spine(could not clinically rule out C-spine injury on initial exam) No bony injury to his left wrist or hand  Nursing staff applied wound care to his left hand Left eyebrow wound repaired without difficulty. Patient discharged in care of family Final Clinical Impression(s) / ED Diagnoses Final diagnoses:  Fall, initial encounter  Laceration of left eyebrow, initial encounter  Concussion without loss of consciousness, initial encounter  Abrasion of left hand, initial encounter    Rx / DC Orders ED Discharge Orders    None       Ripley Fraise, MD 09/16/20 [phone]

## 2020-09-16 NOTE — ED Notes (Signed)
Patient laceration to left eye cleaned and new dressing applied. Area still bleeding. Skin tear to posterior left hand and middle digit cleaned and dressed.

## 2020-09-20 ENCOUNTER — Ambulatory Visit (INDEPENDENT_AMBULATORY_CARE_PROVIDER_SITE_OTHER): Payer: Medicare HMO | Admitting: Family Medicine

## 2020-09-20 ENCOUNTER — Other Ambulatory Visit: Payer: Self-pay

## 2020-09-20 ENCOUNTER — Encounter: Payer: Self-pay | Admitting: Family Medicine

## 2020-09-20 VITALS — BP 132/88 | HR 74 | Temp 97.7°F | Resp 20 | Ht 66.0 in | Wt 245.0 lb

## 2020-09-20 DIAGNOSIS — S0181XA Laceration without foreign body of other part of head, initial encounter: Secondary | ICD-10-CM

## 2020-09-20 DIAGNOSIS — S61412A Laceration without foreign body of left hand, initial encounter: Secondary | ICD-10-CM | POA: Diagnosis not present

## 2020-09-20 DIAGNOSIS — S61419A Laceration without foreign body of unspecified hand, initial encounter: Secondary | ICD-10-CM

## 2020-09-20 DIAGNOSIS — W19XXXA Unspecified fall, initial encounter: Secondary | ICD-10-CM | POA: Diagnosis not present

## 2020-09-20 NOTE — Progress Notes (Signed)
Subjective:  Patient ID: [name] [name], male    DOB: 1946-03-04  Age: 75 y.o. MRN: [id number]  CC: Right hand pain   HPI [name] [name] presents for concerns about swelling and pain in the left hand.  [name] had a fall on Friday the 18th according to his caregiver who is here with him.  He says he was just walking across the room and started to fall and the next thing he knew he was on the floor.  He denies any chest pain or shortness of breath.  He did not have any kind of postictal symptoms.  He has not had this before.  He has a walker at home but does not use it.  Currently he is having pain and swelling in the left wrist and thumb area.  He has an open skin tear.  This was evaluated in the emergency room with multiple x-rays for the night encouraged.  There was no fracture noted.  That x-ray was reviewed today as part of the prep for the visit.  He had stitches placed across the left brow.  That seems to be doing well.  His biggest concern for his injuries is in the left hand and wrist.  His grip is weaker.  He does not have any numbness.  Depression screen [facility] 2/9 09/20/2020 08/10/2020 06/08/2020  Decreased Interest 0 0 0  Down, Depressed, Hopeless 0 0 0  PHQ - 2 Score 0 0 0    History Malik has a past medical history of Colon polyp, GERD (gastroesophageal reflux disease), Hemorrhoid, Hyperplasia of prostate, Hypertension, Other and unspecified hyperlipidemia, Other testicular hypofunction, Pneumonia, Pulmonary embolism ([place]), and Sinus bradycardia.   He has a past surgical history that includes Appendectomy and Hand surgery (Right).   His family history includes Diabetes in his daughter and father; Heart attack in his mother; Stroke in his mother.He reports that he quit smoking about 7 years ago. His smoking use included cigars. His smokeless tobacco use includes chew. He reports current alcohol use. He reports that he does not use drugs.    ROS Review of Systems   Constitutional: Negative for fever.  Respiratory: Negative for shortness of breath.   Cardiovascular: Negative for chest pain.  Musculoskeletal: Negative for arthralgias.  Skin: Positive for rash and wound (See HPI).    Objective:  BP 132/88   Pulse 74   Temp 97.7 F (36.5 C) (Temporal)   Resp 20   Ht 5\' 6"  (1.676 m)   Wt 245 lb (111.1 kg)   SpO2 98%   BMI 39.54 kg/m   BP Readings from Last 3 Encounters:  09/20/20 132/88  09/16/20 122/68  08/29/20 132/80    Wt Readings from Last 3 Encounters:  09/20/20 245 lb (111.1 kg)  09/16/20 250 lb (113.4 kg)  08/29/20 250 lb (113.4 kg)     Physical Exam Vitals reviewed.  Constitutional:      Appearance: He is well-developed and well-nourished.  HENT:     Head: Normocephalic and atraumatic.     Right Ear: External ear normal.     Left Ear: External ear normal.     Mouth/Throat:     Pharynx: No oropharyngeal exudate or posterior oropharyngeal erythema.  Eyes:     Pupils: Pupils are equal, round, and reactive to light.  Cardiovascular:     Rate and Rhythm: Normal rate and regular rhythm.     Heart sounds: No murmur heard.   Pulmonary:  Effort: No respiratory distress.     Breath sounds: Normal breath sounds.  Musculoskeletal:     Cervical back: Normal range of motion and neck supple.  Neurological:     Mental Status: He is alert and oriented to person, place, and time.   The skin of the dorsal left wrist shows a skin tear that is approximately 11 cm in length.  It shows a very shallow angle making a bit of the with the apex pointing superolaterally.  There is a flap that is discolored.  It is very thin and could not be sutured.  It is acting as a living bandage for the skin tear.  The hand itself has 3+ edema with some bruising.  There is no sign of infection there is an intact suture line at the left brow.  This was inspected and shown to have no signs of infection.  Currently there is no redness or swelling.  It is  only minimally tender and there is no discharge.    Assessment & Plan:   Tyvon was seen today for right hand pain.  Diagnoses and all orders for this visit:  Fall, initial encounter  Laceration of brow without complication, initial encounter  Skin tear of hand without complication, initial encounter  Wound care was reviewed for each of these lesions.  He was advised of the signs and symptoms of infection.  They should keep the skin tear of the hand dressed with triple antibiotic and gauze.  There should be a sling used to elevate the hand in order to reduce swelling.     I am having Jovon L. Trulock maintain his aspirin, Vitamin D-3, NON FORMULARY, vitamin C, Garlic, benazepril, omeprazole, pravastatin, Fish Oil, Eliquis DVT/PE Starter Pack, and b complex vitamins.  Allergies as of 09/20/2020      Reactions   Cialis [tadalafil]    Viagra [sildenafil Citrate] Other (See Comments)   heartburn      Medication List       Accurate as of September 20, 2020  6:39 PM. If you have any questions, ask your nurse or doctor.        aspirin 81 MG tablet Take 81 mg by mouth daily.   b complex vitamins capsule Take 1 capsule by mouth daily.   benazepril 10 MG tablet Commonly known as: LOTENSIN Take 1 tablet (10 mg total) by mouth daily.   Eliquis DVT/PE Starter Pack Generic drug: Apixaban Starter Pack (10mg  and 5mg ) Take 1 tablet by mouth in the morning and at bedtime.   Fish Oil 1000 MG Caps Take 3 capsules (3,000 mg total) by mouth daily.   Garlic 5176 MG Caps Take 2 capsules by mouth daily.   NON FORMULARY Take 1 tablet by mouth daily. Equate allergy tablet   omeprazole 20 MG capsule Commonly known as: PRILOSEC Take 1 capsule (20 mg total) by mouth daily.   pravastatin 40 MG tablet Commonly known as: PRAVACHOL Take 1 tablet (40 mg total) by mouth daily.   vitamin C 500 MG tablet Commonly known as: ASCORBIC ACID Take 500 mg by mouth daily.   Vitamin D-3 125 MCG  (5000 UT) Tabs Take 5,000 Units by mouth daily.      Patient should obtain a sling and keep the arm in it in order to elevate the hand and wrist at all times until the swelling resolves.  Follow-up: Return if symptoms worsen or fail to improve.  Claretta Fraise, M.D.

## 2020-09-25 NOTE — Telephone Encounter (Signed)
Patient returned call.  He is aware to hold Eliquis 2 days before procedure.

## 2020-09-25 NOTE — Telephone Encounter (Signed)
Left message for patient to return phone call about holding Eliquis

## 2020-10-11 ENCOUNTER — Other Ambulatory Visit: Payer: Self-pay

## 2020-10-11 ENCOUNTER — Encounter: Payer: Self-pay | Admitting: Family Medicine

## 2020-10-11 ENCOUNTER — Ambulatory Visit (INDEPENDENT_AMBULATORY_CARE_PROVIDER_SITE_OTHER): Payer: Medicare HMO | Admitting: Family Medicine

## 2020-10-11 VITALS — BP 136/70 | HR 69 | Ht 66.0 in | Wt 246.0 lb

## 2020-10-11 DIAGNOSIS — E782 Mixed hyperlipidemia: Secondary | ICD-10-CM | POA: Diagnosis not present

## 2020-10-11 DIAGNOSIS — K219 Gastro-esophageal reflux disease without esophagitis: Secondary | ICD-10-CM | POA: Diagnosis not present

## 2020-10-11 DIAGNOSIS — N400 Enlarged prostate without lower urinary tract symptoms: Secondary | ICD-10-CM | POA: Diagnosis not present

## 2020-10-11 DIAGNOSIS — I10 Essential (primary) hypertension: Secondary | ICD-10-CM

## 2020-10-11 MED ORDER — TADALAFIL 5 MG PO TABS: 5.0000 mg | ORAL_TABLET | Freq: Every day | ORAL | 5 refills | Status: DC | PRN

## 2020-10-11 NOTE — Progress Notes (Signed)
BP 136/70   Pulse 69   Ht '5\' 6"'  (1.676 m)   Wt 246 lb (111.6 kg)   SpO2 96%   BMI 39.71 kg/m    Subjective:   Patient ID: [name] [name], male    DOB: [birth year], 75 y.o.   MRN: [id number]  HPI: [name] [name] is a 75 y.o. male presenting on 10/11/2020 for Medical Management of Chronic Issues and Hypertension   HPI Hypertension Patient is currently on benazepril, and their blood pressure today is 136/70. Patient denies any lightheadedness or dizziness. Patient denies headaches, blurred vision, chest pains, shortness of breath, or weakness. Denies any side effects from medication and is content with current medication.   Hyperlipidemia Patient is coming in for recheck of his hyperlipidemia. The patient is currently taking fish oil and pravastatin. They deny any issues with myalgias or history of liver damage from it. They deny any focal numbness or weakness or chest pain.   GERD Patient is currently on omeprazole.  She denies any major symptoms or abdominal pain or belching or burping. She denies any blood in her stool or lightheadedness or dizziness.   Patient gets a little erectile dysfunction wants to try some Cialis, he has had some before in the past as samples.  Patient also has BPH and they have been monitoring prostate.  He denies any major urinary issues but does have some frequency at times.  Relevant past medical, surgical, family and social history reviewed and updated as indicated. Interim medical history since our last visit reviewed. Allergies and medications reviewed and updated.  Review of Systems  Constitutional: Negative for chills and fever.  Eyes: Negative for discharge.  Respiratory: Negative for shortness of breath and wheezing.   Cardiovascular: Negative for chest pain and leg swelling.  Genitourinary: Positive for frequency. Negative for decreased urine volume, scrotal swelling and testicular pain.  Musculoskeletal: Negative for back pain and gait  problem.  Skin: Negative for rash.  All other systems reviewed and are negative.   Per HPI unless specifically indicated above   Allergies as of 10/11/2020      Reactions   Cialis [tadalafil]    Viagra [sildenafil Citrate] Other (See Comments)   heartburn      Medication List       Accurate as of October 11, 2020  8:11 AM. If you have any questions, ask your nurse or doctor.        STOP taking these medications   NON FORMULARY Stopped by: Fransisca Kaufmann Dettinger, MD     TAKE these medications   aspirin 81 MG tablet Take 81 mg by mouth daily.   b complex vitamins capsule Take 1 capsule by mouth daily.   benazepril 10 MG tablet Commonly known as: LOTENSIN Take 1 tablet (10 mg total) by mouth daily.   Eliquis DVT/PE Starter Pack Generic drug: Apixaban Starter Pack (59m and 545m Take 1 tablet by mouth in the morning and at bedtime.   Fish Oil 1000 MG Caps Take 3 capsules (3,000 mg total) by mouth daily.   Garlic 107824G Caps Take 2 capsules by mouth daily.   omeprazole 20 MG capsule Commonly known as: PRILOSEC Take 1 capsule (20 mg total) by mouth daily.   pravastatin 40 MG tablet Commonly known as: PRAVACHOL Take 1 tablet (40 mg total) by mouth daily.   vitamin C 500 MG tablet Commonly known as: ASCORBIC ACID Take 500 mg by mouth daily.   Vitamin D-3 125 MCG (5000  UT) Tabs Take 5,000 Units by mouth daily.        Objective:   BP 136/70   Pulse 69   Ht '5\' 6"'  (1.676 m)   Wt 246 lb (111.6 kg)   SpO2 96%   BMI 39.71 kg/m   Wt Readings from Last 3 Encounters:  10/11/20 246 lb (111.6 kg)  09/20/20 245 lb (111.1 kg)  09/16/20 250 lb (113.4 kg)    Physical Exam Vitals and nursing note reviewed.  Constitutional:      General: He is not in acute distress.    Appearance: He is well-developed. He is not diaphoretic.  Eyes:     General: No scleral icterus.    Conjunctiva/sclera: Conjunctivae normal.  Neck:     Thyroid: No thyromegaly.   Cardiovascular:     Rate and Rhythm: Normal rate and regular rhythm.     Heart sounds: Normal heart sounds. No murmur heard.   Pulmonary:     Effort: Pulmonary effort is normal. No respiratory distress.     Breath sounds: Normal breath sounds. No wheezing.  Musculoskeletal:        General: Normal range of motion.     Cervical back: Neck supple.  Lymphadenopathy:     Cervical: No cervical adenopathy.  Skin:    General: Skin is warm and dry.     Findings: No rash.  Neurological:     Mental Status: He is alert and oriented to person, place, and time.     Coordination: Coordination normal.  Psychiatric:        Behavior: Behavior normal.       Assessment & Plan:   Problem List Items Addressed This Visit      Cardiovascular and Mediastinum   Hypertension, essential - Primary   Relevant Medications   tadalafil (CIALIS) 5 MG tablet   Other Relevant Orders   CMP14+EGFR     Digestive   GERD (gastroesophageal reflux disease)   Relevant Orders   CBC with Differential/Platelet     Genitourinary   BPH (benign prostatic hyperplasia)   Relevant Orders   PSA, total and free     Other   Hyperlipidemia   Relevant Medications   tadalafil (CIALIS) 5 MG tablet   Other Relevant Orders   CMP14+EGFR   Lipid panel      Patient says he is previously tried Cialis and wants to prescription for it, I do not know why it is on his allergy list because he says he has never had problems with it.  Will stop Eliquis after 6 months which would be after the end of April.  Continue current medication. Follow up plan: Return in about 6 months (around 04/13/2021), or if symptoms worsen or fail to improve, for htn and hld.  Counseling provided for all of the vaccine components No orders of the defined types were placed in this encounter.   Caryl Pina, MD [facility] 10/11/2020, 8:11 AM

## 2020-10-12 LAB — CMP14+EGFR
ALT: 30 IU/L (ref 0–44)
AST: 27 IU/L (ref 0–40)
Albumin/Globulin Ratio: 2.2 (ref 1.2–2.2)
Albumin: 3.9 g/dL (ref 3.7–4.7)
Alkaline Phosphatase: 54 IU/L (ref 44–121)
BUN/Creatinine Ratio: 13 (ref 10–24)
BUN: 11 mg/dL (ref 8–27)
Bilirubin Total: 0.6 mg/dL (ref 0.0–1.2)
CO2: 27 mmol/L (ref 20–29)
Calcium: 9.1 mg/dL (ref 8.6–10.2)
Chloride: 101 mmol/L (ref 96–106)
Creatinine, Ser: 0.87 mg/dL (ref 0.76–1.27)
Globulin, Total: 1.8 g/dL (ref 1.5–4.5)
Glucose: 111 mg/dL — ABNORMAL HIGH (ref 65–99)
Potassium: 4.1 mmol/L (ref 3.5–5.2)
Sodium: 142 mmol/L (ref 134–144)
Total Protein: 5.7 g/dL — ABNORMAL LOW (ref 6.0–8.5)
eGFR: 89 mL/min/{1.73_m2} (ref >59)

## 2020-10-12 LAB — CBC WITH DIFFERENTIAL/PLATELET
Basophils Absolute: 0.1 10*3/uL (ref 0.0–0.2)
Basos: 1 %
EOS (ABSOLUTE): 0.2 10*3/uL (ref 0.0–0.4)
Eos: 2 %
Hematocrit: 40.3 % (ref 37.5–51.0)
Hemoglobin: 14.1 g/dL (ref 13.0–17.7)
Immature Grans (Abs): 0 10*3/uL (ref 0.0–0.1)
Immature Granulocytes: 0 %
Lymphocytes Absolute: 2 10*3/uL (ref 0.7–3.1)
Lymphs: 25 %
MCH: 32.6 pg (ref 26.6–33.0)
MCHC: 35 g/dL (ref 31.5–35.7)
MCV: 93 fL (ref 79–97)
Monocytes Absolute: 0.5 10*3/uL (ref 0.1–0.9)
Monocytes: 6 %
Neutrophils Absolute: 5.3 10*3/uL (ref 1.4–7.0)
Neutrophils: 66 %
Platelets: 249 10*3/uL (ref 150–450)
RBC: 4.33 x10E6/uL (ref 4.14–5.80)
RDW: 12.6 % (ref 11.6–15.4)
WBC: 8.1 10*3/uL (ref 3.4–10.8)

## 2020-10-12 LAB — LIPID PANEL
Chol/HDL Ratio: 2.9 ratio (ref 0.0–5.0)
Cholesterol, Total: 152 mg/dL (ref 100–199)
HDL: 52 mg/dL (ref >39)
LDL Chol Calc (NIH): 80 mg/dL (ref 0–99)
Triglycerides: 108 mg/dL (ref 0–149)
VLDL Cholesterol Cal: 20 mg/dL (ref 5–40)

## 2020-10-12 LAB — PSA, TOTAL AND FREE
PSA, Free Pct: 13.7 %
PSA, Free: 0.52 ng/mL
Prostate Specific Ag, Serum: 3.8 ng/mL (ref 0.0–4.0)

## 2020-10-13 ENCOUNTER — Other Ambulatory Visit: Payer: Self-pay | Admitting: Internal Medicine

## 2020-10-13 DIAGNOSIS — Z1152 Encounter for screening for COVID-19: Secondary | ICD-10-CM | POA: Diagnosis not present

## 2020-10-14 LAB — SARS CORONAVIRUS 2 (TAT 6-24 HRS): SARS Coronavirus 2: NEGATIVE

## 2020-10-17 ENCOUNTER — Encounter: Payer: Self-pay | Admitting: Internal Medicine

## 2020-10-17 ENCOUNTER — Ambulatory Visit (AMBULATORY_SURGERY_CENTER): Payer: Medicare HMO | Admitting: Internal Medicine

## 2020-10-17 ENCOUNTER — Other Ambulatory Visit: Payer: Self-pay

## 2020-10-17 VITALS — BP 138/67 | HR 51 | Temp 96.9°F | Resp 12 | Ht 66.0 in | Wt 250.0 lb

## 2020-10-17 DIAGNOSIS — Z860100 Personal history of colon polyps, unspecified: Secondary | ICD-10-CM

## 2020-10-17 DIAGNOSIS — K6389 Other specified diseases of intestine: Secondary | ICD-10-CM | POA: Diagnosis not present

## 2020-10-17 DIAGNOSIS — D123 Benign neoplasm of transverse colon: Secondary | ICD-10-CM

## 2020-10-17 DIAGNOSIS — R933 Abnormal findings on diagnostic imaging of other parts of digestive tract: Secondary | ICD-10-CM

## 2020-10-17 DIAGNOSIS — Z1211 Encounter for screening for malignant neoplasm of colon: Secondary | ICD-10-CM | POA: Diagnosis not present

## 2020-10-17 MED ORDER — SODIUM CHLORIDE 0.9 % IV SOLN
500.0000 mL | Freq: Once | INTRAVENOUS | Status: DC
Start: 2020-10-17 — End: 2020-10-17

## 2020-10-17 NOTE — Op Note (Signed)
[place] Patient Name: Nezar Buckles Procedure Date: 10/17/2020 2:22 PM MRN: [id number] Endoscopist: Gatha Mayer , MD Age: 75 Referring MD:  Date of Birth: 30-Sep-1945 Gender: Male Account #: [id number] Procedure:                Colonoscopy Indications:              Abnormal CT of the GI tract Medicines:                Propofol per Anesthesia, Monitored Anesthesia Care Procedure:                Pre-Anesthesia Assessment:                           - Prior to the procedure, a History and Physical                            was performed, and patient medications and                            allergies were reviewed. The patient's tolerance of                            previous anesthesia was also reviewed. The risks                            and benefits of the procedure and the sedation                            options and risks were discussed with the patient.                            All questions were answered, and informed consent                            was obtained. Prior Anticoagulants: The patient                            last took Eliquis (apixaban) 2 days prior to the                            procedure. ASA Grade Assessment: II - A patient                            with mild systemic disease. After reviewing the                            risks and benefits, the patient was deemed in                            satisfactory condition to undergo the procedure.                           After obtaining informed consent, the colonoscope  was passed under direct vision. Throughout the                            procedure, the patient's blood pressure, pulse, and                            oxygen saturations were monitored continuously. The                            Olympus CF-HQ190L [id number]) Colonoscope was                            introduced through the anus and advanced to the the                            cecum,  identified by appendiceal orifice and                            ileocecal valve. The colonoscopy was performed                            without difficulty. The patient tolerated the                            procedure well. The quality of the bowel                            preparation was good. The ileocecal valve,                            appendiceal orifice, and rectum were photographed. Scope In: 2:24:51 PM Scope Out: 2:36:21 PM Scope Withdrawal Time: 0 hours 8 minutes 30 seconds  Total Procedure Duration: 0 hours 11 minutes 30 seconds  Findings:                 The perianal and digital rectal examinations were                            normal. Pertinent negatives include normal prostate                            (size, shape, and consistency).                           A diminutive polyp was found in the transverse                            colon. The polyp was sessile. The polyp was removed                            with a cold snare. Resection and retrieval were                            complete. Verification of patient identification  for the specimen was done. Estimated blood loss was                            minimal.                           Anal papilla(e) were hypertrophied.                           The exam was otherwise without abnormality on                            direct and retroflexion views. Complications:            No immediate complications. Estimated Blood Loss:     Estimated blood loss was minimal. Impression:               - One diminutive polyp in the transverse colon,                            removed with a cold snare. Resected and retrieved.                           - Anal papilla(e) were hypertrophied.                           - The examination was otherwise normal on direct                            and retroflexion views. Recommendation:           - Patient has a contact number available for                             emergencies. The signs and symptoms of potential                            delayed complications were discussed with the                            patient. Return to normal activities tomorrow.                            Written discharge instructions were provided to the                            patient.                           - Resume previous diet.                           - Continue present medications.                           - Resume Eliquis (apixaban) at prior dose tomorrow.                           -  No repeat colonoscopy due to age. Gatha Mayer, MD 10/17/2020 2:45:00 PM This report has been signed electronically.

## 2020-10-17 NOTE — Progress Notes (Signed)
A and O x3. Report to RN. Tolerated MAC anesthesia well.

## 2020-10-17 NOTE — Progress Notes (Signed)
VS by Raymer  I have reviewed the patient's medical history in detail and updated the computerized patient record.  

## 2020-10-17 NOTE — Progress Notes (Signed)
Called to room to assist during endoscopic procedure.  Patient ID and intended procedure confirmed with present staff. Received instructions for my participation in the procedure from the performing physician.  

## 2020-10-17 NOTE — Patient Instructions (Addendum)
Just one tiny polyp - removed.  All else ok.  Restart Eliquis tomorrow.  You will not need a repeat colonoscopy unless having significant problems.  I appreciate the opportunity to care for you. Gatha Mayer, MD, [facility]  Resume previous diet Continue current medications RESTART Eliquis tomorrow Await pathology YOU HAD AN ENDOSCOPIC PROCEDURE TODAY AT [facility]:   Refer to the procedure report that was given to you for any specific questions about what was found during the examination.  If the procedure report does not answer your questions, please call your gastroenterologist to clarify.  If you requested that your care partner not be given the details of your procedure findings, then the procedure report has been included in a sealed envelope for you to review at your convenience later.  YOU SHOULD EXPECT: Some feelings of bloating in the abdomen. Passage of more gas than usual.  Walking can help get rid of the air that was put into your GI tract during the procedure and reduce the bloating. If you had a lower endoscopy (such as a colonoscopy or flexible sigmoidoscopy) you may notice spotting of blood in your stool or on the toilet paper. If you underwent a bowel prep for your procedure, you may not have a normal bowel movement for a few days.  Please Note:  You might notice some irritation and congestion in your nose or some drainage.  This is from the oxygen used during your procedure.  There is no need for concern and it should clear up in a day or so.  SYMPTOMS TO REPORT IMMEDIATELY:   Following lower endoscopy (colonoscopy or flexible sigmoidoscopy):  Excessive amounts of blood in the stool  Significant tenderness or worsening of abdominal pains  Swelling of the abdomen that is new, acute  Fever of 100F or higher  For urgent or emergent issues, a gastroenterologist can be reached at any hour by calling [phone]. Do not use MyChart messaging for urgent  concerns.   DIET:  We do recommend a small meal at first, but then you may proceed to your regular diet.  Drink plenty of fluids but you should avoid alcoholic beverages for 24 hours.  ACTIVITY:  You should plan to take it easy for the rest of today and you should NOT DRIVE or use heavy machinery until tomorrow (because of the sedation medicines used during the test).    FOLLOW UP: Our staff will call the number listed on your records 48-72 hours following your procedure to check on you and address any questions or concerns that you may have regarding the information given to you following your procedure. If we do not reach you, we will leave a message.  We will attempt to reach you two times.  During this call, we will ask if you have developed any symptoms of COVID 19. If you develop any symptoms (ie: fever, flu-like symptoms, shortness of breath, cough etc.) before then, please call [phone].  If you test positive for Covid 19 in the 2 weeks post procedure, please call and report this information to [place].    If any biopsies were taken you will be contacted by phone or by letter within the next 1-3 weeks.  Please call us at [phone] if you have not heard about the biopsies in 3 weeks.   SIGNATURES/CONFIDENTIALITY: You and/or your care partner have signed paperwork which will be entered into your electronic medical record.  These signatures attest to the fact that  that the information above on your After Visit Summary has been reviewed and is understood.  Full responsibility of the confidentiality of this discharge information lies with you and/or your care-partner.

## 2020-10-19 ENCOUNTER — Telehealth: Payer: Self-pay

## 2020-10-19 DIAGNOSIS — M1712 Unilateral primary osteoarthritis, left knee: Secondary | ICD-10-CM | POA: Diagnosis not present

## 2020-10-19 DIAGNOSIS — M170 Bilateral primary osteoarthritis of knee: Secondary | ICD-10-CM | POA: Diagnosis not present

## 2020-10-19 DIAGNOSIS — M1711 Unilateral primary osteoarthritis, right knee: Secondary | ICD-10-CM | POA: Diagnosis not present

## 2020-10-19 DIAGNOSIS — M179 Osteoarthritis of knee, unspecified: Secondary | ICD-10-CM | POA: Diagnosis not present

## 2020-10-19 NOTE — Telephone Encounter (Signed)
  Follow up Call-  Call back number 10/17/2020  Post procedure Call Back phone  # [phone]  Permission to leave phone message Yes  Some recent data might be hidden     Patient questions:  Do you have a fever, pain , or abdominal swelling? No. Pain Score  0 *  Have you tolerated food without any problems? Yes.    Have you been able to return to your normal activities? Yes.    Do you have any questions about your discharge instructions: Diet   No. Medications  No. Follow up visit  No.  Do you have questions or concerns about your Care? No.  Actions: * If pain score is 4 or above: No action needed, pain <4. 1. Have you developed a fever since your procedure? no  2.   Have you had an respiratory symptoms (SOB or cough) since your procedure? no  3.   Have you tested positive for COVID 19 since your procedure no  4.   Have you had any family members/close contacts diagnosed with the COVID 19 since your procedure?  no   If yes to any of these questions please route to Joylene [name], RN and Joella Prince, RN

## 2020-10-26 ENCOUNTER — Encounter: Payer: Self-pay | Admitting: *Deleted

## 2020-10-26 DIAGNOSIS — M9903 Segmental and somatic dysfunction of lumbar region: Secondary | ICD-10-CM | POA: Diagnosis not present

## 2020-10-26 DIAGNOSIS — M5136 Other intervertebral disc degeneration, lumbar region: Secondary | ICD-10-CM | POA: Diagnosis not present

## 2020-10-30 ENCOUNTER — Encounter: Payer: Self-pay | Admitting: Internal Medicine

## 2020-11-01 DIAGNOSIS — M9903 Segmental and somatic dysfunction of lumbar region: Secondary | ICD-10-CM | POA: Diagnosis not present

## 2020-11-01 DIAGNOSIS — M5136 Other intervertebral disc degeneration, lumbar region: Secondary | ICD-10-CM | POA: Diagnosis not present

## 2020-11-14 ENCOUNTER — Ambulatory Visit: Payer: Medicare HMO | Admitting: Pulmonary Disease

## 2020-11-14 ENCOUNTER — Encounter: Payer: Self-pay | Admitting: Pulmonary Disease

## 2020-11-14 ENCOUNTER — Other Ambulatory Visit: Payer: Self-pay

## 2020-11-14 VITALS — BP 124/82 | HR 77 | Temp 98.0°F | Ht 67.0 in | Wt 249.0 lb

## 2020-11-14 DIAGNOSIS — I2699 Other pulmonary embolism without acute cor pulmonale: Secondary | ICD-10-CM

## 2020-11-14 DIAGNOSIS — I824Z3 Acute embolism and thrombosis of unspecified deep veins of distal lower extremity, bilateral: Secondary | ICD-10-CM

## 2020-11-14 NOTE — Progress Notes (Signed)
Subjective:   PATIENT ID: [name] [name] GENDER: male DOB: 1946/05/05, MRN: [id number]   HPI  Chief Complaint  Patient presents with  . Follow-up    No complaints    Reason for Visit: Follow-up   [name] is a 75 year old male with recent covid infection in October 6387 complicated by pulmonary embolism who presents for follow-up.  He reports he is overall compliant with his anticoagulation for his PE. Denies shortness of breath, chest pain. He recently fell in February and had mostly superficial bleeding. No other bleeding issues since then. He ambulates at home without assistance. Though he states he does minimal activity due to knee pain.  I have personally reviewed patient's past medical/family/social history/allergies/current medications.  Past Medical History:  Diagnosis Date  . Colon polyp   . GERD (gastroesophageal reflux disease)   . Hemorrhoid   . Hyperplasia of prostate   . Hypertension   . Other and unspecified hyperlipidemia   . Other testicular hypofunction   . Pneumonia   . Pulmonary embolism ([place])   . Sinus bradycardia      Outpatient Medications Prior to Visit  Medication Sig Dispense Refill  . APIXABAN (ELIQUIS) VTE STARTER PACK (10MG  AND 5MG ) TAKE 2 TABLETS (10 MG TOTAL) BY MOUTH 2 (TWO) TIMES DAILY FOR 7 DAYS; THEN TAKE 1 TABLET TWO TIMES DAILY (Patient taking differently: TAKE 2 TABLETS (10 MG TOTAL) BY MOUTH 2 (TWO) TIMES DAILY FOR 7 DAYS; THEN TAKE 1 TABLET TWO TIMES DAILY) 74 tablet 0  . aspirin 81 MG tablet Take 81 mg by mouth daily.    Marland Kitchen b complex vitamins capsule Take 1 capsule by mouth daily.    . benazepril (LOTENSIN) 10 MG tablet Take 1 tablet (10 mg total) by mouth daily. 90 tablet 3  . Cholecalciferol (VITAMIN D-3) 5000 UNITS TABS Take 5,000 Units by mouth daily.    Marland Kitchen ELIQUIS DVT/PE STARTER PACK Take 1 tablet by mouth in the morning and at bedtime.    . Garlic 5643 MG CAPS Take 2 capsules by mouth daily.    . Omega-3 Fatty  Acids (FISH OIL) 1000 MG CAPS Take 3 capsules (3,000 mg total) by mouth daily.  0  . omeprazole (PRILOSEC) 20 MG capsule Take 1 capsule (20 mg total) by mouth daily. 90 capsule 3  . pravastatin (PRAVACHOL) 40 MG tablet Take 1 tablet (40 mg total) by mouth daily. 90 tablet 3  . tadalafil (CIALIS) 5 MG tablet Take 1 tablet (5 mg total) by mouth daily as needed for erectile dysfunction. 30 tablet 5  . vitamin C (ASCORBIC ACID) 500 MG tablet Take 500 mg by mouth daily.     No facility-administered medications prior to visit.    Review of Systems  Constitutional: Negative for chills, diaphoresis, fever, malaise/fatigue and weight loss.  HENT: Negative for congestion.   Respiratory: Negative for cough, hemoptysis, sputum production, shortness of breath and wheezing.   Cardiovascular: Negative for chest pain, palpitations and leg swelling.     Objective:   Vitals:   11/14/20 1530  BP: 124/82  Pulse: 77  Temp: 98 F (36.7 C)  SpO2: 95%  Weight: 249 lb (112.9 kg)  Height: 5\' 7"  (1.702 m)   SpO2: 95 % O2 Device: None (Room air)  Physical Exam: General: Well-appearing, no acute distress HENT: [place], AT Eyes: EOMI, no scleral icterus Respiratory: Clear to auscultation bilaterally.  No crackles, wheezing or rales Cardiovascular: RRR, -M/R/G, no JVD Extremities:-Edema,-tenderness Neuro: AAO  x4, CNII-XII grossly intact Skin: Intact, no rashes or bruising Psych: Normal mood, normal affect  Physical Exam: General: Well-appearing, no acute distress HENT: [place], AT Eyes: EOMI, no scleral icterus Respiratory: Clear to auscultation bilaterally.  No crackles, wheezing or rales Cardiovascular: RRR, -M/R/G, no JVD Extremities:-Edema,-tenderness Neuro: AAO x4, CNII-XII grossly intact Skin: Intact, no rashes or bruising Psych: Normal mood, normal affect   Data Reviewed:  Imaging: CTA 05/31/20 - Acute PE bilaterally. GGO R>L suggestive of pulmonary infarct LE Dopplers 06/01/20 - Left DVT  involving popliteal, posterior tibial and peroneal veins  PFT: None on file  Labs: CBC    Component Value Date/Time   WBC 8.1 10/11/2020 0840   WBC 7.6 08/14/2020 1947   RBC 4.33 10/11/2020 0840   RBC 4.47 08/14/2020 1947   HGB 14.1 10/11/2020 0840   HCT 40.3 10/11/2020 0840   PLT 249 10/11/2020 0840   MCV 93 10/11/2020 0840   MCH 32.6 10/11/2020 0840   MCH 33.6 08/14/2020 1947   MCHC 35.0 10/11/2020 0840   MCHC 35.2 08/14/2020 1947   RDW 12.6 10/11/2020 0840   LYMPHSABS 2.0 10/11/2020 0840   EOSABS 0.2 10/11/2020 0840   BASOSABS 0.1 10/11/2020 0840  Normal WBC, Hg, Plt  Imaging, labs and test noted above have been reviewed independently by me.      Assessment & Plan:   Discussion: 75 year old male with pulmonary embolism/DVT likely provoked in setting of inflammatory viral illness and decreased mobility. On room air without any respiratory complaints.  Pulmonary embolism, likely provoked in setting of covid infection Bilateral lower extremity DVTs --Agree with 6 months of anticoagulation. Will complete at the end of this month. --Encourage regular activity. He declines physical therapy at this visit   Health Maintenance Immunization History  Administered Date(s) Administered  . Pneumococcal Conjugate-13 05/03/2013  . Pneumococcal Polysaccharide-23 07/29/2009  . Td 07/30/2003   CT Lung Screen - not indicated  No orders of the defined types were placed in this encounter. No orders of the defined types were placed in this encounter.   Return if symptoms worsen or fail to improve.  I have spent a total time of 25-minutes on the day of the appointment reviewing prior documentation, coordinating care and discussing medical diagnosis and plan with the patient/family. Imaging, labs and tests included in this note have been reviewed and interpreted independently by me.  Piffard, MD [facility] 11/14/2020 4:03 PM  Office Number  [phone]

## 2020-11-14 NOTE — Patient Instructions (Signed)
Pulmonary embolism, likely provoked in setting of covid infection Bilateral lower extremity DVTs --Agree with 6 months of anticoagulation. Will complete at the end of this month. --Encourage regular activity. He declines physical therapy at this visit

## 2020-11-20 DIAGNOSIS — M9903 Segmental and somatic dysfunction of lumbar region: Secondary | ICD-10-CM | POA: Diagnosis not present

## 2020-11-20 DIAGNOSIS — M5136 Other intervertebral disc degeneration, lumbar region: Secondary | ICD-10-CM | POA: Diagnosis not present

## 2020-11-21 ENCOUNTER — Encounter: Payer: Self-pay | Admitting: Pulmonary Disease

## 2020-11-27 DIAGNOSIS — M5136 Other intervertebral disc degeneration, lumbar region: Secondary | ICD-10-CM | POA: Diagnosis not present

## 2020-11-27 DIAGNOSIS — M9903 Segmental and somatic dysfunction of lumbar region: Secondary | ICD-10-CM | POA: Diagnosis not present

## 2020-12-02 DIAGNOSIS — H2513 Age-related nuclear cataract, bilateral: Secondary | ICD-10-CM | POA: Diagnosis not present

## 2020-12-02 DIAGNOSIS — H40033 Anatomical narrow angle, bilateral: Secondary | ICD-10-CM | POA: Diagnosis not present

## 2020-12-09 ENCOUNTER — Other Ambulatory Visit: Payer: Self-pay

## 2020-12-09 ENCOUNTER — Emergency Department (HOSPITAL_COMMUNITY)
Admission: EM | Admit: 2020-12-09 | Discharge: 2020-12-09 | Disposition: A | Discharge status: Home / Self Care | Payer: Medicare HMO | Attending: Emergency Medicine | Admitting: Emergency Medicine

## 2020-12-09 ENCOUNTER — Encounter (HOSPITAL_COMMUNITY): Payer: Self-pay

## 2020-12-09 DIAGNOSIS — M25561 Pain in right knee: Secondary | ICD-10-CM | POA: Diagnosis not present

## 2020-12-09 DIAGNOSIS — R0902 Hypoxemia: Secondary | ICD-10-CM | POA: Diagnosis not present

## 2020-12-09 DIAGNOSIS — M25562 Pain in left knee: Secondary | ICD-10-CM | POA: Diagnosis not present

## 2020-12-09 DIAGNOSIS — Z5321 Procedure and treatment not carried out due to patient leaving prior to being seen by health care provider: Secondary | ICD-10-CM | POA: Diagnosis not present

## 2020-12-09 DIAGNOSIS — M25569 Pain in unspecified knee: Secondary | ICD-10-CM | POA: Diagnosis not present

## 2020-12-09 DIAGNOSIS — R0689 Other abnormalities of breathing: Secondary | ICD-10-CM | POA: Diagnosis not present

## 2020-12-09 NOTE — ED Triage Notes (Signed)
Pt arrived via EMS c/o bilateral knee pain rated 10/10. States he has been drinking moonshine all day. Pain is constant.

## 2020-12-09 NOTE — ED Triage Notes (Signed)
Pt left with family member.  

## 2020-12-10 ENCOUNTER — Emergency Department (HOSPITAL_BASED_OUTPATIENT_CLINIC_OR_DEPARTMENT_OTHER): Payer: Medicare HMO

## 2020-12-10 ENCOUNTER — Emergency Department (HOSPITAL_BASED_OUTPATIENT_CLINIC_OR_DEPARTMENT_OTHER)
Admission: EM | Admit: 2020-12-10 | Discharge: 2020-12-10 | Disposition: A | Discharge status: Home / Self Care | Payer: Medicare HMO | Attending: Emergency Medicine | Admitting: Emergency Medicine

## 2020-12-10 ENCOUNTER — Encounter (HOSPITAL_BASED_OUTPATIENT_CLINIC_OR_DEPARTMENT_OTHER): Payer: Self-pay | Admitting: Emergency Medicine

## 2020-12-10 ENCOUNTER — Other Ambulatory Visit: Payer: Self-pay

## 2020-12-10 DIAGNOSIS — Z7901 Long term (current) use of anticoagulants: Secondary | ICD-10-CM | POA: Insufficient documentation

## 2020-12-10 DIAGNOSIS — Z7982 Long term (current) use of aspirin: Secondary | ICD-10-CM | POA: Insufficient documentation

## 2020-12-10 DIAGNOSIS — Z87891 Personal history of nicotine dependence: Secondary | ICD-10-CM | POA: Diagnosis not present

## 2020-12-10 DIAGNOSIS — M79604 Pain in right leg: Secondary | ICD-10-CM | POA: Diagnosis not present

## 2020-12-10 DIAGNOSIS — M79605 Pain in left leg: Secondary | ICD-10-CM | POA: Diagnosis not present

## 2020-12-10 DIAGNOSIS — M7989 Other specified soft tissue disorders: Secondary | ICD-10-CM | POA: Insufficient documentation

## 2020-12-10 DIAGNOSIS — R600 Localized edema: Secondary | ICD-10-CM | POA: Insufficient documentation

## 2020-12-10 DIAGNOSIS — I10 Essential (primary) hypertension: Secondary | ICD-10-CM | POA: Insufficient documentation

## 2020-12-10 DIAGNOSIS — Y908 Blood alcohol level of 240 mg/100 ml or more: Secondary | ICD-10-CM | POA: Diagnosis not present

## 2020-12-10 DIAGNOSIS — R109 Unspecified abdominal pain: Secondary | ICD-10-CM | POA: Diagnosis not present

## 2020-12-10 LAB — COMPREHENSIVE METABOLIC PANEL
ALT: 71 U/L — ABNORMAL HIGH (ref 0–44)
AST: 137 U/L — ABNORMAL HIGH (ref 15–41)
Albumin: 3.6 g/dL (ref 3.5–5.0)
Alkaline Phosphatase: 44 U/L (ref 38–126)
Anion gap: 15 (ref 5–15)
BUN: 12 mg/dL (ref 8–23)
CO2: 26 mmol/L (ref 22–32)
Calcium: 8.8 mg/dL — ABNORMAL LOW (ref 8.9–10.3)
Chloride: 101 mmol/L (ref 98–111)
Creatinine, Ser: 0.74 mg/dL (ref 0.61–1.24)
GFR, Estimated: >60 mL/min (ref >60)
Glucose, Bld: 76 mg/dL (ref 70–99)
Potassium: 4.1 mmol/L (ref 3.5–5.1)
Sodium: 142 mmol/L (ref 135–145)
Total Bilirubin: 0.9 mg/dL (ref 0.3–1.2)
Total Protein: 6.9 g/dL (ref 6.5–8.1)

## 2020-12-10 LAB — CBC WITH DIFFERENTIAL/PLATELET
Abs Immature Granulocytes: 0.02 10*3/uL (ref 0.00–0.07)
Basophils Absolute: 0.1 10*3/uL (ref 0.0–0.1)
Basophils Relative: 1 %
Eosinophils Absolute: 0.1 10*3/uL (ref 0.0–0.5)
Eosinophils Relative: 2 %
HCT: 44.8 % (ref 39.0–52.0)
Hemoglobin: 15.3 g/dL (ref 13.0–17.0)
Immature Granulocytes: 0 %
Lymphocytes Relative: 44 %
Lymphs Abs: 2.9 10*3/uL (ref 0.7–4.0)
MCH: 34.2 pg — ABNORMAL HIGH (ref 26.0–34.0)
MCHC: 34.2 g/dL (ref 30.0–36.0)
MCV: 100.2 fL — ABNORMAL HIGH (ref 80.0–100.0)
Monocytes Absolute: 0.4 10*3/uL (ref 0.1–1.0)
Monocytes Relative: 7 %
Neutro Abs: 3 10*3/uL (ref 1.7–7.7)
Neutrophils Relative %: 46 %
Platelets: 166 10*3/uL (ref 150–400)
RBC: 4.47 MIL/uL (ref 4.22–5.81)
RDW: 13.2 % (ref 11.5–15.5)
WBC: 6.5 10*3/uL (ref 4.0–10.5)
nRBC: 0 % (ref 0.0–0.2)

## 2020-12-10 LAB — URINALYSIS, ROUTINE W REFLEX MICROSCOPIC
Bilirubin Urine: NEGATIVE
Glucose, UA: NEGATIVE mg/dL
Hgb urine dipstick: NEGATIVE
Ketones, ur: 15 mg/dL — AB
Leukocytes,Ua: NEGATIVE
Nitrite: NEGATIVE
Protein, ur: NEGATIVE mg/dL
Specific Gravity, Urine: 1.015 (ref 1.005–1.030)
pH: 5.5 (ref 5.0–8.0)

## 2020-12-10 LAB — BRAIN NATRIURETIC PEPTIDE: B Natriuretic Peptide: 40 pg/mL (ref 0.0–100.0)

## 2020-12-10 LAB — ETHANOL: Alcohol, Ethyl (B): 283 mg/dL — ABNORMAL HIGH (ref <10)

## 2020-12-10 NOTE — Discharge Instructions (Addendum)
Your alcohol level on todays visit was elevated.   We discussed giving pain medication however this was refused on today's visit.  Please schedule an appointment with your primary care physician in order to obtain further management of your bilateral leg pain.

## 2020-12-10 NOTE — ED Triage Notes (Signed)
Pt c/o B/L leg pain x 3 days. Per family patient has history of blood clots and was taken off eliquis last month.

## 2020-12-10 NOTE — ED Provider Notes (Signed)
[facility] EMERGENCY DEPARTMENT Provider Note   CSN: [id number] Arrival date & time: 12/10/20  1118     History Chief Complaint  Patient presents with  . Leg Pain    [name] [name] is a 75 y.o. male.  75 y.o male with a PMH of HTN, PE, GERD presents to the ED with a chief complaint of bilateral leg swelling x 3 days. Patient's daughter at the bedside reports patient was taking off Eliquis last month, he originally had developed a PE due to his post COVID-19 reaction. Daughter states they had called EMS yesterday to their home, however patient was unable to be seen over at [facility] long due to long weights.  She reports worsening pain to bilateral upper thighs with radiation down his legs.  There is swelling noted to the right greater than left leg.  Patient has not taken any medication for improvement in his symptoms, he does drink about a pint a day, his last drink was an hour prior to arrival in the ED. He does not have any hx of CHF.   The history is provided by the patient.  Leg Pain Location:  Leg Time since incident:  3 days Injury: no   Leg location:  L leg and R leg Pain details:    Quality:  Aching   Radiates to:  R leg and L leg   Severity:  Moderate   Onset quality:  Gradual   Duration:  3 days   Timing:  Constant   Progression:  Worsening Chronicity:  Recurrent Dislocation: no   Relieved by:  Nothing Ineffective treatments:  None tried Associated symptoms: swelling   Associated symptoms: no back pain, no fever and no tingling        Past Medical History:  Diagnosis Date  . Colon polyp   . GERD (gastroesophageal reflux disease)   . Hemorrhoid   . Hyperplasia of prostate   . Hypertension   . Other and unspecified hyperlipidemia   . Other testicular hypofunction   . Pneumonia   . Pulmonary embolism ([place])   . Sinus bradycardia     Patient Active Problem List   Diagnosis Date Noted  . Pulmonary embolism, bilateral ([place]) 08/15/2020  . Acute  deep vein thrombosis (DVT) of distal vein of both lower extremities ([place]) 08/15/2020  . History of pulmonary embolus (PE) 05/31/2020  . Aortic atherosclerosis ([place]) 03/07/2017  . B12 deficiency 12/31/2015  . Hyperlipidemia 07/01/2014  . Erectile dysfunction 02/28/2014  . Vitamin D deficiency 12/14/2012  . Testosterone deficiency   . Hypertension, essential   . Sinus bradycardia   . GERD (gastroesophageal reflux disease)   . Colon polyp history   . BPH (benign prostatic hyperplasia)     Past Surgical History:  Procedure Laterality Date  . APPENDECTOMY    . HAND SURGERY Right        Family History  Problem Relation Age of Onset  . Heart attack Mother   . Stroke Mother   . Diabetes Father   . Diabetes Daughter     Social History   Tobacco Use  . Smoking status: Former Smoker    Types: Cigars    Quit date: 2015    Years since quitting: 7.3  . Smokeless tobacco: Current User    Types: Chew  . Tobacco comment: only 1 cigar on occassion  Vaping Use  . Vaping Use: Never used  Substance Use Topics  . Alcohol use: Yes    Comment: daily  .  Drug use: No    Home Medications Prior to Admission medications   Medication Sig Start Date End Date Taking? Authorizing Provider  APIXABAN (ELIQUIS) VTE STARTER PACK (10MG  AND 5MG ) TAKE 2 TABLETS (10 MG TOTAL) BY MOUTH 2 (TWO) TIMES DAILY FOR 7 DAYS; THEN TAKE 1 TABLET TWO TIMES DAILY Patient taking differently: TAKE 2 TABLETS (10 MG TOTAL) BY MOUTH 2 (TWO) TIMES DAILY FOR 7 DAYS; THEN TAKE 1 TABLET TWO TIMES DAILY 06/02/20 06/02/21  Antonieta Pert, MD  aspirin 81 MG tablet Take 81 mg by mouth daily.    [provider]  b complex vitamins capsule Take 1 capsule by mouth daily.    [provider]  benazepril (LOTENSIN) 10 MG tablet Take 1 tablet (10 mg total) by mouth daily. 04/12/20   Dettinger, Fransisca Kaufmann, MD  Cholecalciferol (VITAMIN D-3) 5000 UNITS TABS Take 5,000 Units by mouth daily.    [provider]  Arne Cleveland  DVT/PE STARTER PACK Take 1 tablet by mouth in the morning and at bedtime. 06/02/20   [provider]  Garlic 2355 MG CAPS Take 2 capsules by mouth daily.    [provider]  Omega-3 Fatty Acids (FISH OIL) 1000 MG CAPS Take 3 capsules (3,000 mg total) by mouth daily. 06/02/20   Antonieta Pert, MD  omeprazole (PRILOSEC) 20 MG capsule Take 1 capsule (20 mg total) by mouth daily. 04/12/20   Dettinger, Fransisca Kaufmann, MD  pravastatin (PRAVACHOL) 40 MG tablet Take 1 tablet (40 mg total) by mouth daily. 04/12/20   Dettinger, Fransisca Kaufmann, MD  tadalafil (CIALIS) 5 MG tablet Take 1 tablet (5 mg total) by mouth daily as needed for erectile dysfunction. 10/11/20   Dettinger, Fransisca Kaufmann, MD  vitamin C (ASCORBIC ACID) 500 MG tablet Take 500 mg by mouth daily.    [provider]  amLODipine (NORVASC) 5 MG tablet Take 1 tablet (5 mg total) by mouth daily. Patient not taking: Reported on 05/31/2020 04/12/20 05/31/20  Dettinger, Fransisca Kaufmann, MD    Allergies    Cialis [tadalafil] and Viagra [sildenafil citrate]  Review of Systems   Review of Systems  Constitutional: Negative for fever.  HENT: Negative for rhinorrhea and sinus pressure.   Respiratory: Negative for shortness of breath.   Cardiovascular: Positive for leg swelling.  Gastrointestinal: Negative for abdominal pain, nausea and vomiting.  Genitourinary: Negative for flank pain.  Musculoskeletal: Negative for back pain.  Skin: Negative for pallor and wound.  Neurological: Negative for dizziness and headaches.  All other systems reviewed and are negative.   Physical Exam Updated Vital Signs BP (!) 128/51 (BP Location: Right Arm)   Pulse 64   Temp 98.3 F (36.8 C) (Oral)   Resp 18   Ht 5\' 6"  (1.676 m)   Wt 113.4 kg   SpO2 96%   BMI 40.35 kg/m   Physical Exam Vitals and nursing note reviewed.  Constitutional:      Appearance: Normal appearance.  HENT:     Head: Normocephalic and atraumatic.     Nose: Nose normal.     Mouth/Throat:      Mouth: Mucous membranes are dry.  Eyes:     Pupils: Pupils are equal, round, and reactive to light.  Cardiovascular:     Rate and Rhythm: Normal rate.  Pulmonary:     Effort: Pulmonary effort is normal.     Breath sounds: No wheezing or rales.  Abdominal:     General: Abdomen is flat.     Palpations:  Abdomen is soft.     Tenderness: There is no abdominal tenderness. There is no right CVA tenderness or left CVA tenderness.  Musculoskeletal:     Cervical back: Normal range of motion and neck supple.     Right lower leg: 1+ Edema present.     Left lower leg: 1+ Edema present.  Skin:    General: Skin is warm and dry.  Neurological:     Mental Status: He is alert and oriented to person, place, and time.     ED Results / Procedures / Treatments   Labs (all labs ordered are listed, but only abnormal results are displayed) Labs Reviewed  CBC WITH DIFFERENTIAL/PLATELET - Abnormal; Notable for the following components:      Result Value   MCV 100.2 (*)    MCH 34.2 (*)    All other components within normal limits  COMPREHENSIVE METABOLIC PANEL - Abnormal; Notable for the following components:   Calcium 8.8 (*)    AST 137 (*)    ALT 71 (*)    All other components within normal limits  ETHANOL - Abnormal; Notable for the following components:   Alcohol, Ethyl (B) 283 (*)    All other components within normal limits  URINALYSIS, ROUTINE W REFLEX MICROSCOPIC - Abnormal; Notable for the following components:   Ketones, ur 15 (*)    All other components within normal limits  BRAIN NATRIURETIC PEPTIDE    EKG None  Radiology US Venous Img Lower Right (DVT Study)  Result Date: 12/10/2020 CLINICAL DATA:  Right flank pain for several months EXAM: RIGHT LOWER EXTREMITY VENOUS DOPPLER ULTRASOUND TECHNIQUE: Gray-scale sonography with compression, as well as color and duplex ultrasound, were performed to evaluate the deep venous system(s) from the level of the common femoral vein  through the popliteal and proximal calf veins. COMPARISON:  None. FINDINGS: VENOUS Normal compressibility of the common femoral, superficial femoral, and popliteal veins, as well as the visualized calf veins. Visualized portions of profunda femoral vein and great saphenous vein unremarkable. No filling defects to suggest DVT on grayscale or color Doppler imaging. Doppler waveforms show normal direction of venous flow, normal respiratory plasticity and response to augmentation. Limited views of the contralateral common femoral vein are unremarkable. OTHER There is a fluid collection in the popliteal fossa measuring up to 8.3 cm. No internal blood flow. Limitations: none IMPRESSION: 1.  Negative for DVT in the right lower extremity. 2. Incidentally noted fluid collection in the popliteal fossa measuring 8.3 cm, potentially a Baker's cyst. Electronically Signed   By: Audie Pinto M.D.   On: 12/10/2020 16:34    Procedures Procedures   Medications Ordered in ED Medications - No data to display  ED Course  I have reviewed the triage vital signs and the nursing notes.  Pertinent labs & imaging results that were available during my care of the patient were reviewed by me and considered in my medical decision making (see chart for details).  Clinical Course as of 12/10/20 1752  Sun Dec 10, 2020  1647 Alcohol, Ethyl (B)(!): 283 [JS]  1647 AST(!): 137 Daily alcohol use [JS]    Clinical Course User Index [JS] Janeece Fitting, PA-C   MDM Rules/Calculators/A&P  Patient with a PMH of PE, HTN presents to the ED with a chief complaint of bilateral leg swelling for the past 3 days.  Does have a prior history of provoked PE post-COVID infection, was previously taking Eliquis and completed the 32-month treatment.  This was  discontinued the middle of April after his pulmonology visit according to chart review.  Today's visit he is accompanied by daughter at the bedside who reports patient has been drinking a  lot, drinks about a pint a day, he has been drinking moonshine when asked about drinking patient states "I do not drink enough ".  Daughter shows concern for a new blood clot in his legs.  During evaluation patient appears to be clinically intoxicated.  Moves all upper and lower extremities.  Denies any chest pain or shortness of breath.  Does have bilateral swelling right worse than left with 1+ pitting edema.  Pulses are present, and his capillary refill is intact to bilateral lower feet.  Prior history of CHF, BNP on today's visit is 40.  Ethanol level was obtained as patient did have alcohol prior to arrival in the ED, this is elevated at 283.  Daughter does report he does drink daily.  She offered him some pain medication and he refused to take any medication.  Interpretation of his labs reveal no electrolyte derangement and his CMP  Levels within normal limits.  LFTs are elevated, with AST at 137.  Denies any pain around his abdomen.  Has not had any nausea, vomiting.  CBC without any leukocytosis or signs of anemia. UA without any nitrates, leukocytes or signs of infection.  US DVT study was obtained which was negative on today's visit.  5:51 PM discussed the results of all imaging along with labs with daughter at the bedside, she was informed of the Baker's cyst located to her dad's right knee, she reports that he will be receiving anything for pain on today's visit.  I explained to her that I am unable to give him a prescription for something as he is currently abusing alcohol.  He reports he feels like "I am not drinking much that should be ".  Does not have any SI or HI on today's visit.  He did lose his wife in December.  According to daughter he will be running back to his home where he cares for himself.  We discussed Ace wrap to help with pain from his Baker's cyst.  He using any type of intervention at this time.  His vitals have remained stable, did not have any trauma prior to arrival.   Understands and agrees with management, we encourage PCP follow-up.  Patient is stable for discharge.    Portions of this note were generated with Lobbyist. Dictation errors may occur despite best attempts at proofreading.  Final Clinical Impression(s) / ED Diagnoses Final diagnoses:  Bilateral leg pain    Rx / DC Orders ED Discharge Orders    None       Janeece Fitting, PA-C 12/10/20 1752    Little, Wenda Overland, MD 12/13/20 1601

## 2020-12-12 ENCOUNTER — Emergency Department (HOSPITAL_COMMUNITY)
Admission: EM | Admit: 2020-12-12 | Discharge: 2020-12-12 | Disposition: A | Discharge status: Home / Self Care | Payer: Medicare HMO | Attending: Emergency Medicine | Admitting: Emergency Medicine

## 2020-12-12 ENCOUNTER — Encounter (HOSPITAL_COMMUNITY): Payer: Self-pay | Admitting: Emergency Medicine

## 2020-12-12 ENCOUNTER — Other Ambulatory Visit: Payer: Self-pay

## 2020-12-12 ENCOUNTER — Emergency Department (HOSPITAL_COMMUNITY): Payer: Medicare HMO

## 2020-12-12 DIAGNOSIS — M48061 Spinal stenosis, lumbar region without neurogenic claudication: Secondary | ICD-10-CM | POA: Diagnosis not present

## 2020-12-12 DIAGNOSIS — M5459 Other low back pain: Secondary | ICD-10-CM | POA: Diagnosis not present

## 2020-12-12 DIAGNOSIS — R3981 Functional urinary incontinence: Secondary | ICD-10-CM | POA: Diagnosis present

## 2020-12-12 DIAGNOSIS — Z87891 Personal history of nicotine dependence: Secondary | ICD-10-CM | POA: Insufficient documentation

## 2020-12-12 DIAGNOSIS — Z7901 Long term (current) use of anticoagulants: Secondary | ICD-10-CM | POA: Insufficient documentation

## 2020-12-12 DIAGNOSIS — I10 Essential (primary) hypertension: Secondary | ICD-10-CM | POA: Insufficient documentation

## 2020-12-12 DIAGNOSIS — Z7982 Long term (current) use of aspirin: Secondary | ICD-10-CM | POA: Diagnosis not present

## 2020-12-12 DIAGNOSIS — R262 Difficulty in walking, not elsewhere classified: Secondary | ICD-10-CM | POA: Diagnosis not present

## 2020-12-12 DIAGNOSIS — M25562 Pain in left knee: Secondary | ICD-10-CM | POA: Diagnosis not present

## 2020-12-12 DIAGNOSIS — Z79899 Other long term (current) drug therapy: Secondary | ICD-10-CM | POA: Insufficient documentation

## 2020-12-12 DIAGNOSIS — R202 Paresthesia of skin: Secondary | ICD-10-CM | POA: Diagnosis not present

## 2020-12-12 DIAGNOSIS — N3941 Urge incontinence: Secondary | ICD-10-CM | POA: Diagnosis not present

## 2020-12-12 DIAGNOSIS — M25561 Pain in right knee: Secondary | ICD-10-CM | POA: Diagnosis not present

## 2020-12-12 DIAGNOSIS — R32 Unspecified urinary incontinence: Secondary | ICD-10-CM | POA: Diagnosis not present

## 2020-12-12 DIAGNOSIS — M4807 Spinal stenosis, lumbosacral region: Secondary | ICD-10-CM | POA: Diagnosis not present

## 2020-12-12 DIAGNOSIS — M5136 Other intervertebral disc degeneration, lumbar region: Secondary | ICD-10-CM | POA: Diagnosis not present

## 2020-12-12 DIAGNOSIS — M9903 Segmental and somatic dysfunction of lumbar region: Secondary | ICD-10-CM | POA: Diagnosis not present

## 2020-12-12 LAB — URINALYSIS, ROUTINE W REFLEX MICROSCOPIC
Bacteria, UA: NONE SEEN
Bilirubin Urine: NEGATIVE
Glucose, UA: NEGATIVE mg/dL
Ketones, ur: 5 mg/dL — AB
Leukocytes,Ua: NEGATIVE
Nitrite: NEGATIVE
Protein, ur: NEGATIVE mg/dL
Specific Gravity, Urine: 1.006 (ref 1.005–1.030)
pH: 6 (ref 5.0–8.0)

## 2020-12-12 LAB — CBC WITH DIFFERENTIAL/PLATELET
Abs Immature Granulocytes: 0.03 10*3/uL (ref 0.00–0.07)
Basophils Absolute: 0 10*3/uL (ref 0.0–0.1)
Basophils Relative: 1 %
Eosinophils Absolute: 0 10*3/uL (ref 0.0–0.5)
Eosinophils Relative: 0 %
HCT: 44.7 % (ref 39.0–52.0)
Hemoglobin: 15.6 g/dL (ref 13.0–17.0)
Immature Granulocytes: 0 %
Lymphocytes Relative: 22 %
Lymphs Abs: 1.5 10*3/uL (ref 0.7–4.0)
MCH: 34.6 pg — ABNORMAL HIGH (ref 26.0–34.0)
MCHC: 34.9 g/dL (ref 30.0–36.0)
MCV: 99.1 fL (ref 80.0–100.0)
Monocytes Absolute: 0.2 10*3/uL (ref 0.1–1.0)
Monocytes Relative: 3 %
Neutro Abs: 5 10*3/uL (ref 1.7–7.7)
Neutrophils Relative %: 74 %
Platelets: 149 10*3/uL — ABNORMAL LOW (ref 150–400)
RBC: 4.51 MIL/uL (ref 4.22–5.81)
RDW: 12.5 % (ref 11.5–15.5)
WBC: 6.8 10*3/uL (ref 4.0–10.5)
nRBC: 0 % (ref 0.0–0.2)

## 2020-12-12 LAB — COMPREHENSIVE METABOLIC PANEL
ALT: 75 U/L — ABNORMAL HIGH (ref 0–44)
AST: 121 U/L — ABNORMAL HIGH (ref 15–41)
Albumin: 3.7 g/dL (ref 3.5–5.0)
Alkaline Phosphatase: 45 U/L (ref 38–126)
Anion gap: 12 (ref 5–15)
BUN: 10 mg/dL (ref 8–23)
CO2: 26 mmol/L (ref 22–32)
Calcium: 9.2 mg/dL (ref 8.9–10.3)
Chloride: 99 mmol/L (ref 98–111)
Creatinine, Ser: 1.01 mg/dL (ref 0.61–1.24)
GFR, Estimated: >60 mL/min (ref >60)
Glucose, Bld: 160 mg/dL — ABNORMAL HIGH (ref 70–99)
Potassium: 3.6 mmol/L (ref 3.5–5.1)
Sodium: 137 mmol/L (ref 135–145)
Total Bilirubin: 1.5 mg/dL — ABNORMAL HIGH (ref 0.3–1.2)
Total Protein: 6.7 g/dL (ref 6.5–8.1)

## 2020-12-12 MED ORDER — GADOBUTROL 1 MMOL/ML IV SOLN
10.0000 mL | Freq: Once | INTRAVENOUS | Status: AC | PRN
  Administered 2020-12-12: 10 mL via INTRAVENOUS

## 2020-12-12 MED ORDER — BACLOFEN 10 MG PO TABS: 10.0000 mg | ORAL_TABLET | Freq: Every day | ORAL | 0 refills | Status: AC

## 2020-12-12 NOTE — ED Provider Notes (Signed)
Emergency Medicine Provider Triage Evaluation Note  [name] [name] , a 75 y.o. male  was evaluated in triage.  Pt complains of severe back pain. He has leg pain and bilat leg weakness. He reports urinary incontinence, but then states he cannot to the bathroom in time, now that he did not know he was urinating.  He denies bowel incontinence.  No fevers or chills.  No fall, trauma, or injury.  He was sent here by his doctor to rule out cauda equina syndrome.  No numbness or tingling  Review of Systems  Positive: Back pain, ?urianry incontinence, leg weakness Negative: fever  Physical Exam  BP 135/75 (BP Location: Right Arm)   Pulse 73   Temp 99.2 F (37.3 C)   Resp (!) 22   Ht 5\' 6"  (1.676 m)   Wt 113.4 kg   SpO2 93%   BMI 40.35 kg/m  Gen:   Awake, no distress   Resp:  Normal effort  MSK:   Moves extremities without difficulty.  Diffuse TTP of low back bilaterally.  No saddle anesthesia. Other:  No TTP of abdomen  Medical Decision Making  Medically screening exam initiated at 4:27 PM.  Appropriate orders placed.  [name] [name] was informed that the remainder of the evaluation will be completed by another provider, this initial triage assessment does not replace that evaluation, and the importance of remaining in the ED until their evaluation is complete.  Labs, mri, and ua ordered   [name] Heidelberg, PA-C 12/12/20 1634    Elnora Morrison, MD 12/13/20 [phone]

## 2020-12-12 NOTE — ED Triage Notes (Signed)
Pt reports bilateral leg tingling and burning that started Friday that is radiating up to his back. Pt reports difficulty with ambulation. Pt has had 2 episodes of urinary incontinence in the last 24 hours. Pt was sent from primary doctor for concern for cauda equina.

## 2020-12-12 NOTE — ED Provider Notes (Signed)
[facility] EMERGENCY DEPARTMENT Provider Note   CSN: [id number] Arrival date & time: 12/12/20  1554     History Chief Complaint  Patient presents with  . Urinary Incontinence    [name] [name] is a 75 y.o. male.  This is a 75 year old gentleman who presents to the ED with concern for cauda equina syndrome.  He was brought into the ED today from his orthopedist office with his daughter.  He reports that he has been having worsening bilateral lower extremity pain for roughly the past 2-3 weeks which grew significantly worse this past Friday.  He notes that he often has this sharp, shooting pain from his hip down to his knees or below which she associates with sleeping in his arm chair.  This has been going on for several weeks and has been going progressively worse.  He has previously seen a chiropractor for this issue without significant improvement.  On Friday, this pain grew quite bad and he scheduled an appointment with orthopedics.  He was seen in the orthopedics office today at which time he was noted to have several episodes of urinary incontinence.  He reports that he did not realize that he had to go to the bathroom but suddenly had trouble making it to the bathroom.  He has not had any episodes of fecal incontinence.  The orthopedist also noted that he had 1+ reflexes over his Achilles and patellar tendons.  Strong peripheral pulses.  No evidence of saddle anesthesia.  Due to concern for cauda equina syndrome, his orthopedist recommended that he come immediately to the ED for further work-up.  Here in the ED, he continues to deny any evidence of numbness or tingling over the groin or rectum.  He again notes that he did have 1-2 episodes of urinary incontinence without any episodes of fecal incontinence.  He is able to ambulate.        Past Medical History:  Diagnosis Date  . Colon polyp   . GERD (gastroesophageal reflux disease)   . Hemorrhoid   . Hyperplasia  of prostate   . Hypertension   . Other and unspecified hyperlipidemia   . Other testicular hypofunction   . Pneumonia   . Pulmonary embolism ([place])   . Sinus bradycardia     Patient Active Problem List   Diagnosis Date Noted  . Pulmonary embolism, bilateral ([place]) 08/15/2020  . Acute deep vein thrombosis (DVT) of distal vein of both lower extremities ([place]) 08/15/2020  . History of pulmonary embolus (PE) 05/31/2020  . Aortic atherosclerosis ([place]) 03/07/2017  . B12 deficiency 12/31/2015  . Hyperlipidemia 07/01/2014  . Erectile dysfunction 02/28/2014  . Vitamin D deficiency 12/14/2012  . Testosterone deficiency   . Hypertension, essential   . Sinus bradycardia   . GERD (gastroesophageal reflux disease)   . Colon polyp history   . BPH (benign prostatic hyperplasia)     Past Surgical History:  Procedure Laterality Date  . APPENDECTOMY    . HAND SURGERY Right        Family History  Problem Relation Age of Onset  . Heart attack Mother   . Stroke Mother   . Diabetes Father   . Diabetes Daughter     Social History   Tobacco Use  . Smoking status: Former Smoker    Types: Cigars    Quit date: 2015    Years since quitting: 7.3  . Smokeless tobacco: Current User    Types: Chew  . Tobacco comment:  only 1 cigar on occassion  Vaping Use  . Vaping Use: Never used  Substance Use Topics  . Alcohol use: Yes    Comment: daily  . Drug use: No    Home Medications Prior to Admission medications   Medication Sig Start Date End Date Taking? Authorizing Provider  baclofen (LIORESAL) 10 MG tablet Take 1 tablet (10 mg total) by mouth daily for 10 days. 12/12/20 12/22/20 Yes Matilde Haymaker, MD  APIXABAN Arne Cleveland) VTE STARTER PACK (10MG  AND 5MG ) TAKE 2 TABLETS (10 MG TOTAL) BY MOUTH 2 (TWO) TIMES DAILY FOR 7 DAYS; THEN TAKE 1 TABLET TWO TIMES DAILY Patient taking differently: TAKE 2 TABLETS (10 MG TOTAL) BY MOUTH 2 (TWO) TIMES DAILY FOR 7 DAYS; THEN TAKE 1 TABLET TWO TIMES DAILY 06/02/20  06/02/21  Antonieta Pert, MD  aspirin 81 MG tablet Take 81 mg by mouth daily.    [provider]  b complex vitamins capsule Take 1 capsule by mouth daily.    [provider]  benazepril (LOTENSIN) 10 MG tablet Take 1 tablet (10 mg total) by mouth daily. 04/12/20   Dettinger, Fransisca Kaufmann, MD  Cholecalciferol (VITAMIN D-3) 5000 UNITS TABS Take 5,000 Units by mouth daily.    [provider]  Arne Cleveland DVT/PE STARTER PACK Take 1 tablet by mouth in the morning and at bedtime. 06/02/20   [provider]  Garlic 6010 MG CAPS Take 2 capsules by mouth daily.    [provider]  Omega-3 Fatty Acids (FISH OIL) 1000 MG CAPS Take 3 capsules (3,000 mg total) by mouth daily. 06/02/20   Antonieta Pert, MD  omeprazole (PRILOSEC) 20 MG capsule Take 1 capsule (20 mg total) by mouth daily. 04/12/20   Dettinger, Fransisca Kaufmann, MD  pravastatin (PRAVACHOL) 40 MG tablet Take 1 tablet (40 mg total) by mouth daily. 04/12/20   Dettinger, Fransisca Kaufmann, MD  tadalafil (CIALIS) 5 MG tablet Take 1 tablet (5 mg total) by mouth daily as needed for erectile dysfunction. 10/11/20   Dettinger, Fransisca Kaufmann, MD  vitamin C (ASCORBIC ACID) 500 MG tablet Take 500 mg by mouth daily.    [provider]  amLODipine (NORVASC) 5 MG tablet Take 1 tablet (5 mg total) by mouth daily. Patient not taking: Reported on 05/31/2020 04/12/20 05/31/20  Dettinger, Fransisca Kaufmann, MD    Allergies    Cialis [tadalafil] and Viagra [sildenafil citrate]  Review of Systems   Review of Systems  Constitutional: Negative for fever.  HENT: Negative for sore throat.   Respiratory: Negative for shortness of breath and wheezing.   Cardiovascular: Negative for chest pain and palpitations.  Gastrointestinal: Negative for abdominal pain, diarrhea, nausea and vomiting.    Physical Exam Updated Vital Signs BP 135/89   Pulse 63   Temp 99.2 F (37.3 C)   Resp 18   Ht 5\' 6"  (1.676 m)   Wt 113.4 kg   SpO2 93%   BMI 40.35 kg/m   Physical  Exam Constitutional:      General: He is not in acute distress.    Appearance: Normal appearance. He is obese.     Comments: Slightly hard of hearing.  75 year old man appearing stated age lying on his side in bed.  HENT:     Head: Normocephalic and atraumatic.     Mouth/Throat:     Mouth: Mucous membranes are moist.     Pharynx: Oropharynx is clear.  Eyes:     Extraocular Movements: Extraocular movements intact.  Conjunctiva/sclera: Conjunctivae normal.     Pupils: Pupils are equal, round, and reactive to light.  Cardiovascular:     Rate and Rhythm: Normal rate and regular rhythm.     Pulses: Normal pulses.     Heart sounds: Normal heart sounds.  Pulmonary:     Effort: Pulmonary effort is normal.     Breath sounds: Normal breath sounds. No wheezing or rales.  Abdominal:     General: Abdomen is flat. Bowel sounds are normal.     Palpations: Abdomen is soft.     Tenderness: There is no abdominal tenderness.  Genitourinary:    Comments: Low rectal tone. Musculoskeletal:     Cervical back: Normal range of motion and neck supple.  Neurological:     General: No focal deficit present.     Mental Status: He is alert. Mental status is at baseline.     Comments: Hyperreflexive on DTR testing for the patellar and Achilles tendons bilaterally     ED Results / Procedures / Treatments   Labs (all labs ordered are listed, but only abnormal results are displayed) Labs Reviewed  CBC WITH DIFFERENTIAL/PLATELET - Abnormal; Notable for the following components:      Result Value   MCH 34.6 (*)    Platelets 149 (*)    All other components within normal limits  COMPREHENSIVE METABOLIC PANEL - Abnormal; Notable for the following components:   Glucose, Bld 160 (*)    AST 121 (*)    ALT 75 (*)    Total Bilirubin 1.5 (*)    All other components within normal limits  URINALYSIS, ROUTINE W REFLEX MICROSCOPIC - Abnormal; Notable for the following components:   Hgb urine dipstick SMALL (*)     Ketones, ur 5 (*)    All other components within normal limits    EKG None  Radiology MR Lumbar Spine W Wo Contrast  Result Date: 12/12/2020 CLINICAL DATA:  Low back pain, cauda equina syndrome suspected. Additional history provided: Patient reports bilateral leg tingling and burning which began Friday, radiating up back. Difficulty with ambulation. Two episodes of urinary incontinence in the past 24 hours. Patient sent by primary care doctor concern for cauda equina. EXAM: MRI LUMBAR SPINE WITHOUT AND WITH CONTRAST TECHNIQUE: Multiplanar and multiecho pulse sequences of the lumbar spine were obtained without and with intravenous contrast. CONTRAST:  50mL GADAVIST GADOBUTROL 1 MMOL/ML IV SOLN COMPARISON:  CT abdomen/pelvis 08/15/2020 FINDINGS: Segmentation: 5 lumbar vertebrae. The caudal most well-formed intervertebral disc space is designated L5-S1. Alignment: Mild lumbar dextrocurvature. Trace L1-L2 grade 1 retrolisthesis. 2 mm L2-L3 grade 1 anterolisthesis. 2 mm L3-L4 grade 1 retrolisthesis. Trace L4-L5 grade 1 anterolisthesis. Vertebrae: Vertebral body height is maintained. Multilevel degenerative endplate irregularity with small Schmorl nodes. Multilevel mixed degenerative endplate marrow signal. This includes mild degenerative endplate edema at 624THL. Elsewhere, no significant marrow edema or focal suspicious osseous lesion is identified. Conus medullaris and cauda equina: Conus extends to the L1 level. No signal abnormality within the visualized distal spinal cord. No abnormal enhancement of the visualized distal spinal cord or cauda equina nerve roots. Paraspinal and other soft tissues: The partially imaged urinary bladder is distended. Atrophy of the lumbar paraspinal musculature. Disc levels: Multilevel disc degeneration, most notably at L1-L2 (mild/moderate), L2-L3 (moderate), L3-L4 (mild/moderate) and L4-L5 (mild/moderate). Congenitally narrow lumbar spinal canal. T11-T12: Imaged sagittally.  Facet arthrosis. No significant disc herniation or stenosis. T12-L1: Mild facet arthrosis/ligamentum flavum hypertrophy. No significant disc herniation or stenosis. L1-L2: Grade 1 retrolisthesis.  Disc bulge. Facet arthrosis/ligamentum flavum hypertrophy. Bilateral subarticular narrowing (mild right, mild/moderate left) without Nyazia Canevari nerve root impingement. Mild central canal stenosis. Bilateral neural foraminal narrowing (moderate right, moderate/severe left). L2-L3: Grade 1 retrolisthesis. Disc bulge with endplate spurring. Moderate facet arthrosis with ligamentum flavum hypertrophy. Severe central canal stenosis with bilateral subarticular narrowing. Redundancy of the cauda equina nerve roots cephalad to this level. Bilateral neural foraminal narrowing (moderate/severe right, moderate left). L3-L4: Grade 1 retrolisthesis. Disc bulge. Endplate spurring. Moderate facet arthrosis with ligamentum flavum hypertrophy. Mild/moderate bilateral subarticular narrowing with slight crowding of the descending L4 nerve roots. Mild/moderate central canal stenosis. Bilateral neural foraminal narrowing (moderate/severe right, moderate left). L4-L5: Grade 1 anterolisthesis. Disc uncovering with disc bulge and endplate spurring. Advanced facet arthrosis with ligamentum flavum hypertrophy. Severe central canal stenosis with bilateral subarticular narrowing and near complete effacement of the thecal sac. Moderate bilateral neural foraminal narrowing (greater on the right). L5-S1: Moderate facet arthrosis. Mild bilateral subarticular narrowing without nerve root impingement. Central canal patent. Mild/moderate left neural foraminal narrowing. These results were called by telephone at the time of interpretation on 12/12/2020 at 8:06 pm to provider PA Layden, who verbally acknowledged these results. IMPRESSION: Lumbar spondylosis superimposed upon a congenitally narrow lumbar spinal canal, as outlined and with findings most notably as  follows. At L4-L5, there is multifactorial severe central canal stenosis with bilateral subarticular narrowing and near complete effacement of the thecal sac. Moderate bilateral neural foraminal narrowing (greater on the right). At L2-L3, there is multifactorial severe central canal stenosis with bilateral subarticular narrowing. Redundancy of the cauda equina nerve roots cephalad to this level. Bilateral neural foraminal narrowing (moderate/severe right, moderate left). Sites of lesser spinal canal stenosis, as described. Additional sites of neural foraminal narrowing, as detailed and greatest on the left at L1-L2 (moderate/severe), on the right at L2-L3 (moderate/severe) and on the right at L3-L4 (moderate/severe). Electronically Signed   By: Kellie Simmering DO   On: 12/12/2020 20:09    Procedures Procedures   Medications Ordered in ED Medications  gadobutrol (GADAVIST) 1 MMOL/ML injection 10 mL (10 mLs Intravenous Contrast Given 12/12/20 1946)    ED Course  I have reviewed the triage vital signs and the nursing notes.  Pertinent labs & imaging results that were available during my care of the patient were reviewed by me and considered in my medical decision making (see chart for details).    MDM Rules/Calculators/A&P                          [name] [name] is a 75 year old gentleman who presents to the ED for concern for cauda equina syndrome.  He was sent over by his orthopedist.  History and physical exam are mildly concerning for cauda equina syndrome.  MRI lumbar spine demonstrated spinal stenosis in the lumbar region in addition to foraminal narrowing.  Neurosurgery was consulted for further evaluation and treatment options.  According to neurosurgery as assessment, there is low concern for cauda equina syndrome at this time although he does have notable lumbar spine pathology.  Overall, neurosurgery was suspicious that history was more consistent with urge incontinence in the setting of  chronic low back pain.  He has follow-up with neurosurgery in 1 week.  I was able to discuss this positive assessment with [name] [name] and his daughter were encouraged to find that there is no emergency or need for surgery at this time.  They are discharged home with several days of baclofen  and told that it would be appropriate to use the steroid Dosepak they have at home.  Specifically, the daughter reported that she had a Dosepak of prednisone 10 mg tablets.  She was told to provide 5 tablets tomorrow followed by 4 tablets the next day and 3 tablets a day after that, to the day after that and 1 tablet on the final day.  Final Clinical Impression(s) / ED Diagnoses Final diagnoses:  Urge incontinence of urine    Rx / DC Orders ED Discharge Orders         Ordered    baclofen (LIORESAL) 10 MG tablet  Daily        12/12/20 2134           Matilde Haymaker, MD 12/12/20 2156    Elnora Morrison, MD 12/13/20 [phone]

## 2020-12-12 NOTE — Discharge Instructions (Signed)
While you were here, you were evaluated for possible cauda equina syndrome.  Lumbar imaging of your spine did show concern for cauda equina syndrome so we discussed her case with neurosurgery.  Overall, neurosurgery had a low suspicion that you are experiencing symptoms of cauda equina syndrome and there is no need for surgery at this time.  They have asked that you follow-up in clinic with them in roughly 1 week.  For your ongoing lower extremity pain I prescribed you a muscle relaxer for several days.  It is okay for you to take the steroid pack that you have at home.  Sounds like you have prednisone 10 mg and the appropriate doses to take 5 pills on the first day followed by 4 pills in the second day, 3 pills on the third day, 2 pills a day after that and 1 pill on the final day.  I recommend that you follow-up with your primary care doctor for the urge incontinence and lower extremity pain.  If you do begin to experience progressive urinary incontinence or new fecal incontinence, I recommend that you follow-up immediately with neurosurgery.

## 2020-12-12 NOTE — Consult Note (Signed)
Chief Complaint   Chief Complaint  Patient presents with  . Urinary Incontinence    HPI   Consult requested by: Dr Pilar Plate, [facility] Reason for consult: Low back pain  HPI: [name] [name] is a 75 y.o. male with multiple medical comorbidities below who presented to the ED at the recommendation of his Ortho PA for concerns over possible cauda equina syndrome. Briefly, patient developed rather severe LBP around Easter after sleeping in his recliner. No injury. Pain starts midline lower back and radiates down back of both legs to ankles. Associated with n/T in the same distribution. Patient had been managing at home with chiropractic and po medications with minimal to no relief. He went to see Ortho today and upon further questioning, patient endorsed two episodes of urinary incontinence. He was sent to ED for MRI L spine. I was call after MRI of L spine revealed severe spinal stenosis at L4-5. I came by to evaluate the patient.  Upon further questioning, patient has had two episodes of incontinence in the past two days. He has the sensation to urinate and is able to without urinary hesistancy, incomplete void etc. He had two episodes where he was unable to make it to the restroom in time because the pain was so severe. No saddle anesthesia. No weakness. Patient did endorse needing to urinate during eval. Patient was able to hold urine during examination and void without difficulty after.  Patient Active Problem List   Diagnosis Date Noted  . Pulmonary embolism, bilateral ([place]) 08/15/2020  . Acute deep vein thrombosis (DVT) of distal vein of both lower extremities ([place]) 08/15/2020  . History of pulmonary embolus (PE) 05/31/2020  . Aortic atherosclerosis ([place]) 03/07/2017  . B12 deficiency 12/31/2015  . Hyperlipidemia 07/01/2014  . Erectile dysfunction 02/28/2014  . Vitamin D deficiency 12/14/2012  . Testosterone deficiency   . Hypertension, essential   . Sinus bradycardia   . GERD  (gastroesophageal reflux disease)   . Colon polyp history   . BPH (benign prostatic hyperplasia)     PMH: Past Medical History:  Diagnosis Date  . Colon polyp   . GERD (gastroesophageal reflux disease)   . Hemorrhoid   . Hyperplasia of prostate   . Hypertension   . Other and unspecified hyperlipidemia   . Other testicular hypofunction   . Pneumonia   . Pulmonary embolism ([place])   . Sinus bradycardia     PSH: Past Surgical History:  Procedure Laterality Date  . APPENDECTOMY    . HAND SURGERY Right     (Not in a hospital admission)   SH: Social History   Tobacco Use  . Smoking status: Former Smoker    Types: Cigars    Quit date: 2015    Years since quitting: 7.3  . Smokeless tobacco: Current User    Types: Chew  . Tobacco comment: only 1 cigar on occassion  Vaping Use  . Vaping Use: Never used  Substance Use Topics  . Alcohol use: Yes    Comment: daily  . Drug use: No    MEDS: Prior to Admission medications   Medication Sig Start Date End Date Taking? Authorizing Provider  baclofen (LIORESAL) 10 MG tablet Take 1 tablet (10 mg total) by mouth daily for 10 days. 12/12/20 12/22/20 Yes Matilde Haymaker, MD  APIXABAN Arne Cleveland) VTE STARTER PACK (10MG  AND 5MG ) TAKE 2 TABLETS (10 MG TOTAL) BY MOUTH 2 (TWO) TIMES DAILY FOR 7 DAYS; THEN TAKE 1 TABLET TWO TIMES DAILY Patient  taking differently: TAKE 2 TABLETS (10 MG TOTAL) BY MOUTH 2 (TWO) TIMES DAILY FOR 7 DAYS; THEN TAKE 1 TABLET TWO TIMES DAILY 06/02/20 06/02/21  Antonieta Pert, MD  aspirin 81 MG tablet Take 81 mg by mouth daily.    [provider]  b complex vitamins capsule Take 1 capsule by mouth daily.    [provider]  benazepril (LOTENSIN) 10 MG tablet Take 1 tablet (10 mg total) by mouth daily. 04/12/20   Dettinger, Fransisca Kaufmann, MD  Cholecalciferol (VITAMIN D-3) 5000 UNITS TABS Take 5,000 Units by mouth daily.    [provider]  Arne Cleveland DVT/PE STARTER PACK Take 1 tablet by mouth in the morning and  at bedtime. 06/02/20   [provider]  Garlic 4332 MG CAPS Take 2 capsules by mouth daily.    [provider]  Omega-3 Fatty Acids (FISH OIL) 1000 MG CAPS Take 3 capsules (3,000 mg total) by mouth daily. 06/02/20   Antonieta Pert, MD  omeprazole (PRILOSEC) 20 MG capsule Take 1 capsule (20 mg total) by mouth daily. 04/12/20   Dettinger, Fransisca Kaufmann, MD  pravastatin (PRAVACHOL) 40 MG tablet Take 1 tablet (40 mg total) by mouth daily. 04/12/20   Dettinger, Fransisca Kaufmann, MD  tadalafil (CIALIS) 5 MG tablet Take 1 tablet (5 mg total) by mouth daily as needed for erectile dysfunction. 10/11/20   Dettinger, Fransisca Kaufmann, MD  vitamin C (ASCORBIC ACID) 500 MG tablet Take 500 mg by mouth daily.    [provider]  amLODipine (NORVASC) 5 MG tablet Take 1 tablet (5 mg total) by mouth daily. Patient not taking: Reported on 05/31/2020 04/12/20 05/31/20  Dettinger, Fransisca Kaufmann, MD    ALLERGY: Allergies  Allergen Reactions  . Cialis [Tadalafil]   . Viagra [Sildenafil Citrate] Other (See Comments)    heartburn    Social History   Tobacco Use  . Smoking status: Former Smoker    Types: Cigars    Quit date: 2015    Years since quitting: 7.3  . Smokeless tobacco: Current User    Types: Chew  . Tobacco comment: only 1 cigar on occassion  Substance Use Topics  . Alcohol use: Yes    Comment: daily     Family History  Problem Relation Age of Onset  . Heart attack Mother   . Stroke Mother   . Diabetes Father   . Diabetes Daughter      ROS   Review of Systems  All other systems reviewed and are negative.   Exam   Vitals:   12/12/20 1945 12/12/20 2121  BP: (!) 151/80 135/89  Pulse: (!) 54 63  Resp: (!) 22 18  Temp:  99 F (37.2 C)  SpO2: 96% 93%   General appearance: WDWN, NAD Eyes: No scleral injection Cardiovascular: Regular rate and rhythm without murmurs, rubs, gallops. No edema or variciosities. Distal pulses normal. Pulmonary: Effort normal, non-labored  breathing Musculoskeletal:     Muscle tone upper extremities: Normal    Muscle tone lower extremities: Normal    Motor exam: Upper Extremities Deltoid Bicep Tricep Grip  Right 5/5 5/5 5/5 5/5  Left 5/5 5/5 5/5 5/5   Lower Extremity IP Quad PF DF EHL  Right 4/5 4/5 5/5 5/5 5/5  Left 4/5 4/5 5/5 5/5 5/5   Neurological Mental Status:    - Patient is awake, alert, oriented to person, place, month, year, and situation    - Patient is able to give a clear and coherent history.    -  No signs of aphasia or neglect Cranial Nerves    - II: Visual Fields are full. PERRL    - III/IV/VI: EOMI without ptosis or diploplia.     - V: Facial sensation is grossly normal    - VII: Facial movement is symmetric.     - VIII: hearing is intact to voice    - X: Uvula elevates symmetrically    - XI: Shoulder shrug is symmetric.    - XII: tongue is midline without atrophy or fasciculations.  Sensory: Sensation grossly intact to LT  Results - Imaging/Labs   Results for orders placed or performed during the hospital encounter of 12/12/20 (from the past 48 hour(s))  CBC with Differential     Status: Abnormal   Collection Time: 12/12/20  4:23 PM  Result Value Ref Range   WBC 6.8 4.0 - 10.5 K/uL   RBC 4.51 4.22 - 5.81 MIL/uL   Hemoglobin 15.6 13.0 - 17.0 g/dL   HCT 44.7 39.0 - 52.0 %   MCV 99.1 80.0 - 100.0 fL   MCH 34.6 (H) 26.0 - 34.0 pg   MCHC 34.9 30.0 - 36.0 g/dL   RDW 12.5 11.5 - 15.5 %   Platelets 149 (L) 150 - 400 K/uL   nRBC 0.0 0.0 - 0.2 %   Neutrophils Relative % 74 %   Neutro Abs 5.0 1.7 - 7.7 K/uL   Lymphocytes Relative 22 %   Lymphs Abs 1.5 0.7 - 4.0 K/uL   Monocytes Relative 3 %   Monocytes Absolute 0.2 0.1 - 1.0 K/uL   Eosinophils Relative 0 %   Eosinophils Absolute 0.0 0.0 - 0.5 K/uL   Basophils Relative 1 %   Basophils Absolute 0.0 0.0 - 0.1 K/uL   Immature Granulocytes 0 %   Abs Immature Granulocytes 0.03 0.00 - 0.07 K/uL    Comment: Performed at [facility],  [address] [place], [place] [address]  Comprehensive metabolic panel     Status: Abnormal   Collection Time: 12/12/20  4:23 PM  Result Value Ref Range   Sodium 137 135 - 145 mmol/L   Potassium 3.6 3.5 - 5.1 mmol/L   Chloride 99 98 - 111 mmol/L   CO2 26 22 - 32 mmol/L   Glucose, Bld 160 (H) 70 - 99 mg/dL    Comment: Glucose reference range applies only to samples taken after fasting for at least 8 hours.   BUN 10 8 - 23 mg/dL   Creatinine, Ser 1.01 0.61 - 1.24 mg/dL   Calcium 9.2 8.9 - 10.3 mg/dL   Total Protein 6.7 6.5 - 8.1 g/dL   Albumin 3.7 3.5 - 5.0 g/dL   AST 121 (H) 15 - 41 U/L   ALT 75 (H) 0 - 44 U/L   Alkaline Phosphatase 45 38 - 126 U/L   Total Bilirubin 1.5 (H) 0.3 - 1.2 mg/dL   GFR, Estimated >60 >60 mL/min    Comment: (NOTE) Calculated using the CKD-EPI Creatinine Equation (2021)    Anion gap 12 5 - 15    Comment: Performed at [facility] [address], [place],  [address]  Urinalysis, Routine w reflex microscopic Urine, Clean Catch     Status: Abnormal   Collection Time: 12/12/20  4:23 PM  Result Value Ref Range   Color, Urine YELLOW YELLOW   APPearance CLEAR CLEAR   Specific Gravity, Urine 1.006 1.005 - 1.030   pH 6.0 5.0 - 8.0   Glucose, UA  NEGATIVE NEGATIVE mg/dL   Hgb urine dipstick SMALL (A) NEGATIVE   Bilirubin Urine NEGATIVE NEGATIVE   Ketones, ur 5 (A) NEGATIVE mg/dL   Protein, ur NEGATIVE NEGATIVE mg/dL   Nitrite NEGATIVE NEGATIVE   Leukocytes,Ua NEGATIVE NEGATIVE   RBC / HPF 0-5 0 - 5 RBC/hpf   WBC, UA 0-5 0 - 5 WBC/hpf   Bacteria, UA NONE SEEN NONE SEEN    Comment: Performed at [facility] [address] [place], [place] [address]    MR Lumbar Spine W Wo Contrast  Result Date: 12/12/2020 CLINICAL DATA:  Low back pain, cauda equina syndrome suspected. Additional history provided: Patient reports bilateral leg tingling and burning which began Friday, radiating up back. Difficulty with ambulation. Two episodes of urinary  incontinence in the past 24 hours. Patient sent by primary care doctor concern for cauda equina. EXAM: MRI LUMBAR SPINE WITHOUT AND WITH CONTRAST TECHNIQUE: Multiplanar and multiecho pulse sequences of the lumbar spine were obtained without and with intravenous contrast. CONTRAST:  42mL GADAVIST GADOBUTROL 1 MMOL/ML IV SOLN COMPARISON:  CT abdomen/pelvis 08/15/2020 FINDINGS: Segmentation: 5 lumbar vertebrae. The caudal most well-formed intervertebral disc space is designated L5-S1. Alignment: Mild lumbar dextrocurvature. Trace L1-L2 grade 1 retrolisthesis. 2 mm L2-L3 grade 1 anterolisthesis. 2 mm L3-L4 grade 1 retrolisthesis. Trace L4-L5 grade 1 anterolisthesis. Vertebrae: Vertebral body height is maintained. Multilevel degenerative endplate irregularity with small Schmorl nodes. Multilevel mixed degenerative endplate marrow signal. This includes mild degenerative endplate edema at U0-A5. Elsewhere, no significant marrow edema or focal suspicious osseous lesion is identified. Conus medullaris and cauda equina: Conus extends to the L1 level. No signal abnormality within the visualized distal spinal cord. No abnormal enhancement of the visualized distal spinal cord or cauda equina nerve roots. Paraspinal and other soft tissues: The partially imaged urinary bladder is distended. Atrophy of the lumbar paraspinal musculature. Disc levels: Multilevel disc degeneration, most notably at L1-L2 (mild/moderate), L2-L3 (moderate), L3-L4 (mild/moderate) and L4-L5 (mild/moderate). Congenitally narrow lumbar spinal canal. T11-T12: Imaged sagittally. Facet arthrosis. No significant disc herniation or stenosis. T12-L1: Mild facet arthrosis/ligamentum flavum hypertrophy. No significant disc herniation or stenosis. L1-L2: Grade 1 retrolisthesis. Disc bulge. Facet arthrosis/ligamentum flavum hypertrophy. Bilateral subarticular narrowing (mild right, mild/moderate left) without frank nerve root impingement. Mild central canal stenosis.  Bilateral neural foraminal narrowing (moderate right, moderate/severe left). L2-L3: Grade 1 retrolisthesis. Disc bulge with endplate spurring. Moderate facet arthrosis with ligamentum flavum hypertrophy. Severe central canal stenosis with bilateral subarticular narrowing. Redundancy of the cauda equina nerve roots cephalad to this level. Bilateral neural foraminal narrowing (moderate/severe right, moderate left). L3-L4: Grade 1 retrolisthesis. Disc bulge. Endplate spurring. Moderate facet arthrosis with ligamentum flavum hypertrophy. Mild/moderate bilateral subarticular narrowing with slight crowding of the descending L4 nerve roots. Mild/moderate central canal stenosis. Bilateral neural foraminal narrowing (moderate/severe right, moderate left). L4-L5: Grade 1 anterolisthesis. Disc uncovering with disc bulge and endplate spurring. Advanced facet arthrosis with ligamentum flavum hypertrophy. Severe central canal stenosis with bilateral subarticular narrowing and near complete effacement of the thecal sac. Moderate bilateral neural foraminal narrowing (greater on the right). L5-S1: Moderate facet arthrosis. Mild bilateral subarticular narrowing without nerve root impingement. Central canal patent. Mild/moderate left neural foraminal narrowing. These results were called by telephone at the time of interpretation on 12/12/2020 at 8:06 pm to provider PA Layden, who verbally acknowledged these results. IMPRESSION: Lumbar spondylosis superimposed upon a congenitally narrow lumbar spinal canal, as outlined and with findings most notably as follows. At L4-L5, there is multifactorial severe central  canal stenosis with bilateral subarticular narrowing and near complete effacement of the thecal sac. Moderate bilateral neural foraminal narrowing (greater on the right). At L2-L3, there is multifactorial severe central canal stenosis with bilateral subarticular narrowing. Redundancy of the cauda equina nerve roots cephalad to this  level. Bilateral neural foraminal narrowing (moderate/severe right, moderate left). Sites of lesser spinal canal stenosis, as described. Additional sites of neural foraminal narrowing, as detailed and greatest on the left at L1-L2 (moderate/severe), on the right at L2-L3 (moderate/severe) and on the right at L3-L4 (moderate/severe). Electronically Signed   By: Kellie Simmering DO   On: 12/12/2020 20:09   Impression/Plan   75 y.o. male with severe back and BLE pain and what appears to be more urge incontinence, than true urinary incontinence. He is able to void here without difficulties. No urinary hesitancy, incomplete void, no saddle anesthesia. While he does have varying degrees of central canal and foraminal stenosis, most prominent at L4-5 where there is severe spinal stenosis, his symptoms are not consistent with true cauda equina syndrome.   Patient is already feeling better in the ED. He is eager for discharge home. Rec pain control with steroids, muscle relaxers. Can f/u in 1 week. Could consider ESI on outpatient basis. Red flag symptoms discussed.  Ferne Reus, PA-C [facility] [employer]

## 2020-12-13 DIAGNOSIS — M9903 Segmental and somatic dysfunction of lumbar region: Secondary | ICD-10-CM | POA: Diagnosis not present

## 2020-12-13 DIAGNOSIS — M5136 Other intervertebral disc degeneration, lumbar region: Secondary | ICD-10-CM | POA: Diagnosis not present

## 2020-12-14 DIAGNOSIS — M9903 Segmental and somatic dysfunction of lumbar region: Secondary | ICD-10-CM | POA: Diagnosis not present

## 2020-12-14 DIAGNOSIS — M5136 Other intervertebral disc degeneration, lumbar region: Secondary | ICD-10-CM | POA: Diagnosis not present

## 2020-12-18 DIAGNOSIS — M5136 Other intervertebral disc degeneration, lumbar region: Secondary | ICD-10-CM | POA: Diagnosis not present

## 2020-12-18 DIAGNOSIS — M9903 Segmental and somatic dysfunction of lumbar region: Secondary | ICD-10-CM | POA: Diagnosis not present

## 2020-12-19 ENCOUNTER — Other Ambulatory Visit: Payer: Self-pay | Admitting: Family Medicine

## 2020-12-21 ENCOUNTER — Ambulatory Visit: Payer: Medicare HMO | Admitting: Family Medicine

## 2020-12-21 DIAGNOSIS — M5136 Other intervertebral disc degeneration, lumbar region: Secondary | ICD-10-CM | POA: Diagnosis not present

## 2020-12-21 DIAGNOSIS — M9903 Segmental and somatic dysfunction of lumbar region: Secondary | ICD-10-CM | POA: Diagnosis not present

## 2020-12-28 DIAGNOSIS — M5136 Other intervertebral disc degeneration, lumbar region: Secondary | ICD-10-CM | POA: Diagnosis not present

## 2020-12-28 DIAGNOSIS — M9903 Segmental and somatic dysfunction of lumbar region: Secondary | ICD-10-CM | POA: Diagnosis not present

## 2021-01-12 ENCOUNTER — Ambulatory Visit (INDEPENDENT_AMBULATORY_CARE_PROVIDER_SITE_OTHER): Payer: Medicare HMO | Admitting: Nurse Practitioner

## 2021-01-12 ENCOUNTER — Other Ambulatory Visit: Payer: Self-pay

## 2021-01-12 ENCOUNTER — Encounter: Payer: Self-pay | Admitting: Nurse Practitioner

## 2021-01-12 VITALS — BP 115/71 | HR 73 | Temp 97.5°F | Resp 20 | Ht 66.0 in | Wt 239.0 lb

## 2021-01-12 DIAGNOSIS — R609 Edema, unspecified: Secondary | ICD-10-CM

## 2021-01-12 MED ORDER — FUROSEMIDE 20 MG PO TABS: 20.0000 mg | ORAL_TABLET | Freq: Every day | ORAL | 3 refills | Status: DC

## 2021-01-12 NOTE — Progress Notes (Addendum)
   Subjective:    Patient ID: [name] [name], male    DOB: 1945-12-18, 75 y.o.   MRN: [id number]   Chief Complaint: Bilateral leg pain (Bilateral legs swelling/)   HPI Patient comes in today accompanied by his daughter. He is c/o swelling of bil lower ext. He says his legs are hurting. He says this started several months ago.  NO sob.    Review of Systems  Constitutional: Negative.   Respiratory: Negative.    Cardiovascular:  Positive for leg swelling. Negative for chest pain and palpitations.  Genitourinary: Negative.   Neurological: Negative.   Psychiatric/Behavioral: Negative.    All other systems reviewed and are negative.     Objective:   Physical Exam Vitals reviewed.  Constitutional:      Appearance: Normal appearance. He is obese.  Cardiovascular:     Rate and Rhythm: Normal rate and regular rhythm.     Heart sounds: Normal heart sounds.  Pulmonary:     Effort: Pulmonary effort is normal.     Breath sounds: Normal breath sounds.  Musculoskeletal:     Right lower leg: Edema (3+) present.     Left lower leg: Edema (3+) present.  Neurological:     General: No focal deficit present.     Mental Status: He is alert and oriented to person, place, and time.  Psychiatric:        Mood and Affect: Mood normal.        Behavior: Behavior normal.    BP 115/71   Pulse 73   Temp (!) 97.5 F (36.4 C) (Temporal)   Resp 20   Ht 5\' 6"  (1.676 m)   Wt 239 lb (108.4 kg)   SpO2 90%   BMI 38.58 kg/m   Bil unna boots applied     Assessment & Plan:  [name] in today with chief complaint of Bilateral leg pain (Bilateral legs swelling/)   1. Peripheral edema Elevate legs when sitting Start lasix today Recheck on monday Bil unna boot care Meds ordered this encounter  Medications   furosemide (LASIX) 20 MG tablet    Sig: Take 1 tablet (20 mg total) by mouth daily.    Dispense:  30 tablet    Refill:  3    Order Specific Question:   Supervising Provider     Answer:   Caryl Pina A [[id number]]      The above assessment and management plan was discussed with the patient. The patient verbalized understanding of and has agreed to the management plan. Patient is aware to call the clinic if symptoms persist or worsen. Patient is aware when to return to the clinic for a follow-up visit. Patient educated on when it is appropriate to go to the emergency department.   Mary-Margaret Hassell Done, FNP

## 2021-01-12 NOTE — Patient Instructions (Signed)
Peripheral Edema Peripheral edema is swelling that is caused by a buildup of fluid. Peripheral edema most often affects the lower legs, ankles, and feet. It can also develop in the arms, hands, and face. The area of the body that has peripheral edema will look swollen. It may also feel heavy or warm. Your clothes may start to feel tight. Pressing on the area may make a temporary dent in your skin. You may not be able to move your swollen arm or leg as much as usual. There are many causes of peripheral edema. It can happen because of a complication of other conditions such as congestive heart failure, kidney disease, or a problem with your blood circulation. It also can be a side effect of certain medicines or because of an infection. It often happens to women during pregnancy. Sometimes, the cause is not known. Follow these instructions at home: Managing pain, stiffness, and swelling  Raise (elevate) your legs while you are sitting or lying down. Move around often to prevent stiffness and to lessen swelling. Do not sit or stand for long periods of time. Wear support stockings as told by your health care provider. Medicines Take over-the-counter and prescription medicines only as told by your health care provider. Your health care provider may prescribe medicine to help your body get rid of excess water (diuretic). General instructions Pay attention to any changes in your symptoms. Follow instructions from your health care provider about limiting salt (sodium) in your diet. Sometimes, eating less salt may reduce swelling. Moisturize skin daily to help prevent skin from cracking and draining. Keep all follow-up visits as told by your health care provider. This is important. Contact a health care provider if you have: A fever. Edema that starts suddenly or is getting worse, especially if you are pregnant or have a medical condition. Swelling in only one leg. Increased swelling, redness, or pain in  one or both of your legs. Drainage or sores at the area where you have edema. Get help right away if you: Develop shortness of breath, especially when you are lying down. Have pain in your chest or abdomen. Feel weak. Feel faint. Summary Peripheral edema is swelling that is caused by a buildup of fluid. Peripheral edema most often affects the lower legs, ankles, and feet. Move around often to prevent stiffness and to lessen swelling. Do not sit or stand for long periods of time. Pay attention to any changes in your symptoms. Contact a health care provider if you have edema that starts suddenly or is getting worse, especially if you are pregnant or have a medical condition. Get help right away if you develop shortness of breath, especially when lying down. This information is not intended to replace advice given to you by your health care provider. Make sure you discuss any questions you have with your health care provider. Document Revised: 04/08/2018 Document Reviewed: 04/08/2018 Elsevier Patient Education  2022 [employer]  

## 2021-01-15 ENCOUNTER — Encounter: Payer: Self-pay | Admitting: Family Medicine

## 2021-01-15 ENCOUNTER — Other Ambulatory Visit: Payer: Self-pay

## 2021-01-15 ENCOUNTER — Ambulatory Visit (INDEPENDENT_AMBULATORY_CARE_PROVIDER_SITE_OTHER): Payer: Medicare HMO | Admitting: Family Medicine

## 2021-01-15 VITALS — BP 126/78 | HR 115 | Ht 66.0 in

## 2021-01-15 DIAGNOSIS — R609 Edema, unspecified: Secondary | ICD-10-CM | POA: Diagnosis not present

## 2021-01-15 NOTE — Progress Notes (Signed)
BP 126/78   Pulse (!) 115   Ht 5\' 6"  (1.676 m)   SpO2 93%   BMI 38.58 kg/m    Subjective:   Patient ID: [name] [name], male    DOB: [birth year], 75 y.o.   MRN: [id number]  HPI: [name] [name] is a 75 y.o. male presenting on 01/15/2021 for Edema   HPI Patient is coming in for recheck of peripheral edema. He had Unna boots on and warm for the past few days did not like it because he has a lot of burning in his legs and feet.  He says the swelling is improved but says the diuretic has not really made that much of a difference.  He denies any redness or warmth or fevers or chills.  He describes burning and tingling and numbness sensation in both legs.  Relevant past medical, surgical, family and social history reviewed and updated as indicated. Interim medical history since our last visit reviewed. Allergies and medications reviewed and updated.  Review of Systems  Constitutional:  Negative for chills and fever.  Respiratory:  Negative for shortness of breath and wheezing.   Cardiovascular:  Positive for leg swelling. Negative for chest pain and palpitations.  Musculoskeletal:  Negative for back pain and gait problem.  Skin:  Negative for rash.  All other systems reviewed and are negative.  Per HPI unless specifically indicated above   Allergies as of 01/15/2021       Reactions   Cialis [tadalafil]    Viagra [sildenafil Citrate] Other (See Comments)   heartburn        Medication List        Accurate as of January 15, 2021 11:54 AM. If you have any questions, ask your nurse or doctor.          aspirin 81 MG tablet Take 81 mg by mouth daily.   b complex vitamins capsule Take 1 capsule by mouth daily.   benazepril 10 MG tablet Commonly known as: LOTENSIN Take 1 tablet (10 mg total) by mouth daily.   Fish Oil 1000 MG Caps Take 3 capsules (3,000 mg total) by mouth daily.   furosemide 20 MG tablet Commonly known as: LASIX Take 1 tablet (20 mg total) by mouth  daily.   Garlic 5397 MG Caps Take 2 capsules by mouth daily.   omeprazole 20 MG capsule Commonly known as: PRILOSEC Take 1 capsule (20 mg total) by mouth daily.   pravastatin 40 MG tablet Commonly known as: PRAVACHOL Take 1 tablet (40 mg total) by mouth daily.   tadalafil 5 MG tablet Commonly known as: Cialis Take 1 tablet (5 mg total) by mouth daily as needed for erectile dysfunction.   vitamin C 500 MG tablet Commonly known as: ASCORBIC ACID Take 500 mg by mouth daily.   Vitamin D-3 125 MCG (5000 UT) Tabs Take 5,000 Units by mouth daily.         Objective:   BP 126/78   Pulse (!) 115   Ht 5\' 6"  (1.676 m)   SpO2 93%   BMI 38.58 kg/m   Wt Readings from Last 3 Encounters:  01/12/21 239 lb (108.4 kg)  12/12/20 250 lb (113.4 kg)  12/10/20 250 lb (113.4 kg)    Physical Exam Vitals and nursing note reviewed.  Constitutional:      General: He is not in acute distress.    Appearance: He is well-developed. He is not diaphoretic.  Eyes:     General: No scleral  icterus.    Conjunctiva/sclera: Conjunctivae normal.  Neck:     Thyroid: No thyromegaly.  Cardiovascular:     Rate and Rhythm: Normal rate and regular rhythm.     Heart sounds: Normal heart sounds. No murmur heard. Pulmonary:     Effort: Pulmonary effort is normal. No respiratory distress.     Breath sounds: Normal breath sounds. No wheezing.  Musculoskeletal:     Cervical back: Neck supple.  Lymphadenopathy:     Cervical: No cervical adenopathy.  Skin:    General: Skin is warm and dry.     Findings: No rash.  Neurological:     Mental Status: He is alert and oriented to person, place, and time.     Coordination: Coordination normal.  Psychiatric:        Behavior: Behavior normal.      Assessment & Plan:   Problem List Items Addressed This Visit   None Visit Diagnoses     Peripheral edema    -  Primary       Placed Ace wraps, change daily. Follow up plan: Return if symptoms worsen or  fail to improve.   Please put as much compression as can tolerate and he has compression stockings coming in the mail  Counseling provided for all of the vaccine components No orders of the defined types were placed in this encounter.   Caryl Pina, MD [facility] 01/15/2021, 11:54 AM

## 2021-01-16 LAB — BMP8+EGFR
BUN/Creatinine Ratio: 13 (ref 10–24)
BUN: 12 mg/dL (ref 8–27)
CO2: 29 mmol/L (ref 20–29)
Calcium: 9.4 mg/dL (ref 8.6–10.2)
Chloride: 92 mmol/L — ABNORMAL LOW (ref 96–106)
Creatinine, Ser: 0.94 mg/dL (ref 0.76–1.27)
Glucose: 151 mg/dL — ABNORMAL HIGH (ref 65–99)
Potassium: 3.3 mmol/L — ABNORMAL LOW (ref 3.5–5.2)
Sodium: 141 mmol/L (ref 134–144)
eGFR: 84 mL/min/{1.73_m2} (ref >59)

## 2021-01-19 DIAGNOSIS — M170 Bilateral primary osteoarthritis of knee: Secondary | ICD-10-CM | POA: Diagnosis not present

## 2021-01-21 DIAGNOSIS — M1712 Unilateral primary osteoarthritis, left knee: Secondary | ICD-10-CM | POA: Insufficient documentation

## 2021-01-21 DIAGNOSIS — M179 Osteoarthritis of knee, unspecified: Secondary | ICD-10-CM | POA: Insufficient documentation

## 2021-02-12 ENCOUNTER — Encounter: Payer: Self-pay | Admitting: Family Medicine

## 2021-02-12 ENCOUNTER — Ambulatory Visit (INDEPENDENT_AMBULATORY_CARE_PROVIDER_SITE_OTHER): Payer: Medicare HMO | Admitting: Family Medicine

## 2021-02-12 VITALS — BP 122/78 | HR 82 | Temp 98.7°F | Ht 67.0 in | Wt 231.0 lb

## 2021-02-12 DIAGNOSIS — I951 Orthostatic hypotension: Secondary | ICD-10-CM

## 2021-02-12 DIAGNOSIS — I10 Essential (primary) hypertension: Secondary | ICD-10-CM | POA: Diagnosis not present

## 2021-02-12 LAB — CBC WITH DIFFERENTIAL/PLATELET
Basophils Absolute: 0.1 10*3/uL (ref 0.0–0.2)
Basos: 1 %
EOS (ABSOLUTE): 0.3 10*3/uL (ref 0.0–0.4)
Eos: 3 %
Hematocrit: 43.6 % (ref 37.5–51.0)
Hemoglobin: 15.5 g/dL (ref 13.0–17.7)
Immature Grans (Abs): 0 10*3/uL (ref 0.0–0.1)
Immature Granulocytes: 1 %
Lymphocytes Absolute: 2.1 10*3/uL (ref 0.7–3.1)
Lymphs: 25 %
MCH: 34.9 pg — ABNORMAL HIGH (ref 26.6–33.0)
MCHC: 35.6 g/dL (ref 31.5–35.7)
MCV: 98 fL — ABNORMAL HIGH (ref 79–97)
Monocytes Absolute: 0.6 10*3/uL (ref 0.1–0.9)
Monocytes: 7 %
Neutrophils Absolute: 5.6 10*3/uL (ref 1.4–7.0)
Neutrophils: 63 %
Platelets: 154 10*3/uL (ref 150–450)
RBC: 4.44 x10E6/uL (ref 4.14–5.80)
RDW: 12.6 % (ref 11.6–15.4)
WBC: 8.7 10*3/uL (ref 3.4–10.8)

## 2021-02-12 LAB — BMP8+EGFR
BUN/Creatinine Ratio: 9 — ABNORMAL LOW (ref 10–24)
BUN: 9 mg/dL (ref 8–27)
CO2: 36 mmol/L — ABNORMAL HIGH (ref 20–29)
Calcium: 9.8 mg/dL (ref 8.6–10.2)
Chloride: 92 mmol/L — ABNORMAL LOW (ref 96–106)
Creatinine, Ser: 1.05 mg/dL (ref 0.76–1.27)
Glucose: 144 mg/dL — ABNORMAL HIGH (ref 65–99)
Potassium: 3.9 mmol/L (ref 3.5–5.2)
Sodium: 142 mmol/L (ref 134–144)
eGFR: 74 mL/min/{1.73_m2} (ref >59)

## 2021-02-12 NOTE — Progress Notes (Signed)
Acute Office Visit  Subjective:    Patient ID: [name] [name], male    DOB: 1946/05/20, 75 y.o.   MRN: [id number]  Chief Complaint  Patient presents with   Hypotension    HPI Patient is in today for low BP. He reports feeling dizzy after standing and taking a few steps for the last week. It feels like a lightheadedness. His blood pressure has been stable when sitting but has been low when standing. He brought a log of his BPs. Sitting BPs have been 299M-426S systolic and 34-19 systolic with standing. He has stopped taking his lotensin and ciaslis. He has continued to take his lasix daily. He reports that he does not drink enough water. He drank a quarter pint of liquor yesterday. Feels less dizzy today than he did last week. He has been wearing compression stockings. He does not eat regularly. Denies chest pain, shortness of breath, vomiting, diarrhea, palpitations, edema, fever, falls, syncope, presyncope, focal weakness, or bleeding.     Past Medical History:  Diagnosis Date   Colon polyp    GERD (gastroesophageal reflux disease)    Hemorrhoid    Hyperplasia of prostate    Hypertension    Other and unspecified hyperlipidemia    Other testicular hypofunction    Pneumonia    Pulmonary embolism (HCC)    Sinus bradycardia     Past Surgical History:  Procedure Laterality Date   APPENDECTOMY     HAND SURGERY Right     Family History  Problem Relation Age of Onset   Heart attack Mother    Stroke Mother    Diabetes Father    Diabetes Daughter     Social History   Socioeconomic History   Marital status: Married    Spouse name: Psychiatrist   Number of children: 3   Years of education: 10   Highest education level: 10th grade  Occupational History   Occupation: retired  Tobacco Use   Smoking status: Former    Types: Cigars    Quit date: 2015    Years since quitting: 7.5   Smokeless tobacco: Current    Types: Chew   Tobacco comments:    only 1 cigar on [employer] Use   Vaping Use: Never used  Substance and Sexual Activity   Alcohol use: Yes    Comment: daily   Drug use: No   Sexual activity: Not Currently  Other Topics Concern   Not on file  Social History Narrative   Not on file   Social Determinants of Health   Financial Resource Strain: Not on file  Food Insecurity: Not on file  Transportation Needs: Not on file  Physical Activity: Not on file  Stress: Not on file  Social Connections: Not on file  Intimate Partner Violence: Not on file    Outpatient Medications Prior to Visit  Medication Sig Dispense Refill   furosemide (LASIX) 20 MG tablet Take 1 tablet (20 mg total) by mouth daily. 30 tablet 3   omeprazole (PRILOSEC) 20 MG capsule Take 1 capsule (20 mg total) by mouth daily. 90 capsule 3   aspirin 81 MG tablet Take 81 mg by mouth daily.     b complex vitamins capsule Take 1 capsule by mouth daily. (Patient not taking: Reported on 02/12/2021)     benazepril (LOTENSIN) 10 MG tablet Take 1 tablet (10 mg total) by mouth daily. (Patient not taking: Reported on 02/12/2021) 90 tablet 3   Cholecalciferol (VITAMIN D-3)  5000 UNITS TABS Take 5,000 Units by mouth daily. (Patient not taking: Reported on 12/13/9840)     Garlic 1031 MG CAPS Take 2 capsules by mouth daily. (Patient not taking: Reported on 02/12/2021)     Omega-3 Fatty Acids (FISH OIL) 1000 MG CAPS Take 3 capsules (3,000 mg total) by mouth daily. (Patient not taking: Reported on 02/12/2021)  0   pravastatin (PRAVACHOL) 40 MG tablet Take 1 tablet (40 mg total) by mouth daily. (Patient not taking: Reported on 02/12/2021) 90 tablet 3   tadalafil (CIALIS) 5 MG tablet Take 1 tablet (5 mg total) by mouth daily as needed for erectile dysfunction. (Patient not taking: Reported on 02/12/2021) 30 tablet 5   vitamin C (ASCORBIC ACID) 500 MG tablet Take 500 mg by mouth daily. (Patient not taking: Reported on 02/12/2021)     No facility-administered medications prior to visit.    Allergies   Allergen Reactions   Cialis [Tadalafil]    Viagra [Sildenafil Citrate] Other (See Comments)    heartburn    Review of Systems As per HPI.     Objective:    Physical Exam Vitals and nursing note reviewed.  Constitutional:      General: He is not in acute distress.    Appearance: He is not ill-appearing, toxic-appearing or diaphoretic.  HENT:     Head: Normocephalic and atraumatic.  Eyes:     Extraocular Movements: Extraocular movements intact.     Pupils: Pupils are equal, round, and reactive to light.  Cardiovascular:     Rate and Rhythm: Normal rate and regular rhythm.     Heart sounds: Normal heart sounds. No murmur heard. Pulmonary:     Effort: Pulmonary effort is normal.     Breath sounds: Normal breath sounds.  Abdominal:     General: Bowel sounds are normal.     Palpations: Abdomen is soft.  Musculoskeletal:     Right lower leg: No edema.     Left lower leg: No edema.  Skin:    General: Skin is warm and dry.  Neurological:     General: No focal deficit present.     Mental Status: He is alert and oriented to person, place, and time.     Gait: Gait normal.  Psychiatric:        Mood and Affect: Mood normal.        Behavior: Behavior normal.    BP 122/78   Pulse 82   Temp 98.7 F (37.1 C)   Ht '5\' 7"'  (1.702 m)   Wt 231 lb (104.8 kg)   SpO2 96%   BMI 36.18 kg/m  Wt Readings from Last 3 Encounters:  02/12/21 231 lb (104.8 kg)  01/12/21 239 lb (108.4 kg)  12/12/20 250 lb (113.4 kg)     Health Maintenance Due  Topic Date Due   Zoster Vaccines- Shingrix (1 of 2) Never done    There are no preventive care reminders to display for this patient.   Lab Results  Component Value Date   TSH 3.430 12/29/2015   Lab Results  Component Value Date   WBC 6.8 12/12/2020   HGB 15.6 12/12/2020   HCT 44.7 12/12/2020   MCV 99.1 12/12/2020   PLT 149 (L) 12/12/2020   Lab Results  Component Value Date   NA 141 01/15/2021   K 3.3 (L) 01/15/2021   CO2 29  01/15/2021   GLUCOSE 151 (H) 01/15/2021   BUN 12 01/15/2021   CREATININE 0.94 01/15/2021  BILITOT 1.5 (H) 12/12/2020   ALKPHOS 45 12/12/2020   AST 121 (H) 12/12/2020   ALT 75 (H) 12/12/2020   PROT 6.7 12/12/2020   ALBUMIN 3.7 12/12/2020   CALCIUM 9.4 01/15/2021   ANIONGAP 12 12/12/2020   EGFR 84 01/15/2021   Lab Results  Component Value Date   CHOL 152 10/11/2020   Lab Results  Component Value Date   HDL 52 10/11/2020   Lab Results  Component Value Date   LDLCALC 80 10/11/2020   Lab Results  Component Value Date   TRIG 108 10/11/2020   Lab Results  Component Value Date   CHOLHDL 2.9 10/11/2020   Lab Results  Component Value Date   HGBA1C 5.6% 10/11/2013       Assessment & Plan:   Yang was seen today for hypotension.  Diagnoses and all orders for this visit:  Orthostatic hypotension Hold lasix for now as well. Discussed hydration, eating regularly, moving slowly, and avoiding alcohol. Labs pending.  -     CBC with Differential/Platelet -     BMP8+EGFR  Hypertension Patient has stopped medications due to low BP. BP is at goal today with + orthostatic readings. Continue to hold medications for now.   Return in about 3 days (around 02/15/2021) for BP follow up.  The patient indicates understanding of these issues and agrees with the plan.   Gwenlyn Perking, FNP

## 2021-02-12 NOTE — Patient Instructions (Signed)
Orthostatic Hypotension Blood pressure is a measurement of how strongly, or weakly, your blood is pressing against the walls of your arteries. Orthostatic hypotension is a sudden drop in blood pressure that happens when you quickly change positions,such as when you get up from sitting or lying down. Arteries are blood vessels that carry blood from your heart throughout your body. When blood pressure is too low, you may not get enough blood to your brain or to the rest of your organs. This can cause weakness, light-headedness, rapid heartbeat, and fainting. This can last for just a few seconds or for up to a few minutes. Orthostatic hypotension is usually not a serious problem. However, if it happens frequently or gets worse, it may be a sign of somethingmore serious. What are the causes? This condition may be caused by: Sudden changes in posture, such as standing up quickly after you have been sitting or lying down. Blood loss. Loss of body fluids (dehydration). Heart problems. Hormone (endocrine) problems. Pregnancy. Severe infection. Lack of certain nutrients. Severe allergic reactions (anaphylaxis). Certain medicines, such as blood pressure medicine or medicines that make the body lose excess fluids (diuretics). Sometimes, this condition can be caused by not taking medicine as directed, such as taking too much of a certain medicine. What increases the risk? The following factors may make you more likely to develop this condition: Age. Risk increases as you get older. Conditions that affect the heart or the central nervous system. Taking certain medicines, such as blood pressure medicine or diuretics. Being pregnant. What are the signs or symptoms? Symptoms of this condition may include: Weakness. Light-headedness. Dizziness. Blurred vision. Fatigue. Rapid heartbeat. Fainting, in severe cases. How is this diagnosed? This condition is diagnosed based on: Your medical history. Your  symptoms. Your blood pressure measurement. Your health care provider will check your blood pressure when you are: Lying down. Sitting. Standing. A blood pressure reading is recorded as two numbers, such as "120 over 80" (or 120/80). The first ("top") number is called the systolic pressure. It is a measure of the pressure in your arteries as your heart beats. The second ("bottom") number is called the diastolic pressure. It is a measure of the pressure in your arteries when your heart relaxes between beats. Blood pressure is measured in a unit called mm Hg. Healthy blood pressure for most adults is 120/80. If your blood pressure is below 90/60, you may be diagnosed withhypotension. Other information or tests that may be used to diagnose orthostatic hypotension include: Your other vital signs, such as your heart rate and temperature. Blood tests. Tilt table test. For this test, you will be safely secured to a table that moves you from a lying position to an upright position. Your heart rhythm and blood pressure will be monitored during the test. How is this treated? This condition may be treated by: Changing your diet. This may involve eating more salt (sodium) or drinking more water. Taking medicines to raise your blood pressure. Changing the dosage of certain medicines you are taking that might be lowering your blood pressure. Wearing compression stockings. These stockings help to prevent blood clots and reduce swelling in your legs. In some cases, you may need to go to the hospital for: Fluid replacement. This means you will receive fluids through an IV. Blood replacement. This means you will receive donated blood through an IV (transfusion). Treating an infection or heart problems, if this applies. Monitoring. You may need to be monitored while medicines that you   are taking wear off. Follow these instructions at home: Eating and drinking  Drink enough fluid to keep your urine pale  yellow. Eat a healthy diet, and follow instructions from your health care provider about eating or drinking restrictions. A healthy diet includes: Fresh fruits and vegetables. Whole grains. Lean meats. Low-fat dairy products. Eat extra salt only as directed. Do not add extra salt to your diet unless your health care provider told you to do that. Eat frequent, small meals. Avoid standing up suddenly after eating.  Medicines Take over-the-counter and prescription medicines only as told by your health care provider. Follow instructions from your health care provider about changing the dosage of your current medicines, if this applies. Do not stop or adjust any of your medicines on your own. General instructions  Wear compression stockings as told by your health care provider. Get up slowly from lying down or sitting positions. This gives your blood pressure a chance to adjust. Avoid hot showers and excessive heat as directed by your health care provider. Return to your normal activities as told by your health care provider. Ask your health care provider what activities are safe for you. Do not use any products that contain nicotine or tobacco, such as cigarettes, e-cigarettes, and chewing tobacco. If you need help quitting, ask your health care provider. Keep all follow-up visits as told by your health care provider. This is important.  Contact a health care provider if you: Vomit. Have diarrhea. Have a fever for more than 2-3 days. Feel more thirsty than usual. Feel weak and tired. Get help right away if you: Have chest pain. Have a fast or irregular heartbeat. Develop numbness in any part of your body. Cannot move your arms or your legs. Have trouble speaking. Become sweaty or feel light-headed. Faint. Feel short of breath. Have trouble staying awake. Feel confused. Summary Orthostatic hypotension is a sudden drop in blood pressure that happens when you quickly change  positions. Orthostatic hypotension is usually not a serious problem. It is diagnosed by having your blood pressure taken lying down, sitting, and then standing. It may be treated by changing your diet or adjusting your medicines. This information is not intended to replace advice given to you by your health care provider. Make sure you discuss any questions you have with your healthcare provider. Document Revised: 01/08/2018 Document Reviewed: 01/08/2018 Elsevier Patient Education  2022 [employer] Inc[employer]  

## 2021-02-13 ENCOUNTER — Ambulatory Visit: Payer: Medicare HMO | Admitting: Family Medicine

## 2021-02-15 ENCOUNTER — Ambulatory Visit (INDEPENDENT_AMBULATORY_CARE_PROVIDER_SITE_OTHER): Payer: Medicare HMO | Admitting: Family Medicine

## 2021-02-15 ENCOUNTER — Other Ambulatory Visit: Payer: Self-pay

## 2021-02-15 ENCOUNTER — Encounter: Payer: Self-pay | Admitting: Family Medicine

## 2021-02-15 VITALS — BP 124/80 | HR 84 | Ht 67.0 in | Wt 230.0 lb

## 2021-02-15 DIAGNOSIS — I10 Essential (primary) hypertension: Secondary | ICD-10-CM | POA: Diagnosis not present

## 2021-02-15 DIAGNOSIS — K219 Gastro-esophageal reflux disease without esophagitis: Secondary | ICD-10-CM | POA: Diagnosis not present

## 2021-02-15 DIAGNOSIS — E782 Mixed hyperlipidemia: Secondary | ICD-10-CM

## 2021-02-15 NOTE — Progress Notes (Signed)
BP 124/80   Pulse 84   Ht '5\' 7"'  (1.702 m)   Wt 230 lb (104.3 kg)   SpO2 92%   BMI 36.02 kg/m    Subjective:   Patient ID: [name] [name], male    DOB: 01/05/1946, 75 y.o.   MRN: [id number]  HPI: [name] ETHINGTON is a 75 y.o. male presenting on 02/15/2021 for Medical Management of Chronic Issues and Hypertension (3 day follow up)   HPI Hypertension Patient is currently on no medications currently, and their blood pressure today is 124/80. Patient denies any lightheadedness or dizziness. Patient denies headaches, blurred vision, chest pains, shortness of breath, or weakness. Denies any side effects from medication and is content with current medication.   Hyperlipidemia Patient is coming in for recheck of his hyperlipidemia. The patient is currently taking no medication, stopped because of hypotension he was having.. They deny any issues with myalgias or history of liver damage from it. They deny any focal numbness or weakness or chest pain.   GERD Patient is currently on omeprazole.  She denies any major symptoms or abdominal pain or belching or burping. She denies any blood in her stool or lightheadedness or dizziness.   Relevant past medical, surgical, family and social history reviewed and updated as indicated. Interim medical history since our last visit reviewed. Allergies and medications reviewed and updated.  Review of Systems  Constitutional:  Negative for chills and fever.  Respiratory:  Negative for shortness of breath and wheezing.   Cardiovascular:  Negative for chest pain and leg swelling.  Musculoskeletal:  Negative for back pain and gait problem.  Skin:  Negative for rash.  All other systems reviewed and are negative.  Per HPI unless specifically indicated above   Allergies as of 02/15/2021       Reactions   Cialis [tadalafil]    Viagra [sildenafil Citrate] Other (See Comments)   heartburn        Medication List        Accurate as of February 15, 2021   8:44 AM. If you have any questions, ask your nurse or doctor.          STOP taking these medications    aspirin 81 MG tablet Stopped by: Worthy Rancher, MD   b complex vitamins capsule Stopped by: Fransisca Kaufmann Coryn Mosso, MD   benazepril 10 MG tablet Commonly known as: LOTENSIN Stopped by: Worthy Rancher, MD   Fish Oil 1000 MG Caps Stopped by: Fransisca Kaufmann Zakar Brosch, MD   furosemide 20 MG tablet Commonly known as: LASIX Stopped by: Worthy Rancher, MD   Garlic 4098 MG Caps Stopped by: Worthy Rancher, MD   pravastatin 40 MG tablet Commonly known as: PRAVACHOL Stopped by: Worthy Rancher, MD   tadalafil 5 MG tablet Commonly known as: Cialis Stopped by: Worthy Rancher, MD   Vitamin D-3 125 MCG (5000 UT) Tabs Stopped by: Fransisca Kaufmann Jacora Hopkins, MD       TAKE these medications    omeprazole 20 MG capsule Commonly known as: PRILOSEC Take 1 capsule (20 mg total) by mouth daily.   vitamin C 500 MG tablet Commonly known as: ASCORBIC ACID Take 500 mg by mouth daily.         Objective:   BP 124/80   Pulse 84   Ht '5\' 7"'  (1.702 m)   Wt 230 lb (104.3 kg)   SpO2 92%   BMI 36.02 kg/m   Wt Readings from Last  3 Encounters:  02/15/21 230 lb (104.3 kg)  02/12/21 231 lb (104.8 kg)  01/12/21 239 lb (108.4 kg)    Physical Exam Vitals and nursing note reviewed.  Constitutional:      General: He is not in acute distress.    Appearance: He is well-developed. He is not diaphoretic.  Eyes:     General: No scleral icterus.    Conjunctiva/sclera: Conjunctivae normal.  Neck:     Thyroid: No thyromegaly.  Cardiovascular:     Rate and Rhythm: Normal rate and regular rhythm.     Heart sounds: Normal heart sounds. No murmur heard. Pulmonary:     Effort: Pulmonary effort is normal. No respiratory distress.     Breath sounds: Normal breath sounds. No wheezing.  Musculoskeletal:     Cervical back: Neck supple.  Lymphadenopathy:     Cervical: No cervical  adenopathy.  Skin:    General: Skin is warm and dry.     Findings: No rash.  Neurological:     Mental Status: He is alert and oriented to person, place, and time.     Coordination: Coordination normal.  Psychiatric:        Behavior: Behavior normal.    Results for orders placed or performed in visit on 02/12/21  CBC with Differential/Platelet  Result Value Ref Range   WBC 8.7 3.4 - 10.8 x10E3/uL   RBC 4.44 4.14 - 5.80 x10E6/uL   Hemoglobin 15.5 13.0 - 17.7 g/dL   Hematocrit 43.6 37.5 - 51.0 %   MCV 98 (H) 79 - 97 fL   MCH 34.9 (H) 26.6 - 33.0 pg   MCHC 35.6 31.5 - 35.7 g/dL   RDW 12.6 11.6 - 15.4 %   Platelets 154 150 - 450 x10E3/uL   Neutrophils 63 Not Estab. %   Lymphs 25 Not Estab. %   Monocytes 7 Not Estab. %   Eos 3 Not Estab. %   Basos 1 Not Estab. %   Neutrophils Absolute 5.6 1.4 - 7.0 x10E3/uL   Lymphocytes Absolute 2.1 0.7 - 3.1 x10E3/uL   Monocytes Absolute 0.6 0.1 - 0.9 x10E3/uL   EOS (ABSOLUTE) 0.3 0.0 - 0.4 x10E3/uL   Basophils Absolute 0.1 0.0 - 0.2 x10E3/uL   Immature Granulocytes 1 Not Estab. %   Immature Grans (Abs) 0.0 0.0 - 0.1 x10E3/uL  BMP8+EGFR  Result Value Ref Range   Glucose 144 (H) 65 - 99 mg/dL   BUN 9 8 - 27 mg/dL   Creatinine, Ser 1.05 0.76 - 1.27 mg/dL   eGFR 74 >59 mL/min/1.73   BUN/Creatinine Ratio 9 (L) 10 - 24   Sodium 142 134 - 144 mmol/L   Potassium 3.9 3.5 - 5.2 mmol/L   Chloride 92 (L) 96 - 106 mmol/L   CO2 36 (H) 20 - 29 mmol/L   Calcium 9.8 8.6 - 10.2 mg/dL    Assessment & Plan:   Problem List Items Addressed This Visit       Cardiovascular and Mediastinum   Hypertension, essential - Primary     Digestive   GERD (gastroesophageal reflux disease)     Other   Hyperlipidemia    Doing better off of his medications with his blood pressure, continue to hold his blood pressure medicines, will check his blood work and see if he needs the cholesterol medicine back but for now hold all blood pressure medicines Follow up  plan: Return in about 6 months (around 08/18/2021), or if symptoms worsen or fail to improve, for  htn and hld and gerd.  Counseling provided for all of the vaccine components No orders of the defined types were placed in this encounter.   Caryl Pina, MD [facility] 02/15/2021, 8:44 AM

## 2021-02-16 ENCOUNTER — Telehealth: Payer: Self-pay | Admitting: Family Medicine

## 2021-02-23 ENCOUNTER — Inpatient Hospital Stay (HOSPITAL_COMMUNITY): Payer: Medicare HMO

## 2021-02-23 ENCOUNTER — Emergency Department (HOSPITAL_COMMUNITY): Payer: Medicare HMO

## 2021-02-23 ENCOUNTER — Inpatient Hospital Stay (HOSPITAL_BASED_OUTPATIENT_CLINIC_OR_DEPARTMENT_OTHER)
Admission: EM | Admit: 2021-02-23 | Discharge: 2021-02-25 | DRG: 175 | Disposition: A | Discharge status: Home / Self Care | Payer: Medicare HMO | Source: Ambulatory Visit | Attending: Internal Medicine | Admitting: Internal Medicine

## 2021-02-23 ENCOUNTER — Ambulatory Visit (INDEPENDENT_AMBULATORY_CARE_PROVIDER_SITE_OTHER): Payer: Medicare HMO | Admitting: Family Medicine

## 2021-02-23 ENCOUNTER — Emergency Department (HOSPITAL_BASED_OUTPATIENT_CLINIC_OR_DEPARTMENT_OTHER): Payer: Medicare HMO

## 2021-02-23 ENCOUNTER — Encounter (HOSPITAL_BASED_OUTPATIENT_CLINIC_OR_DEPARTMENT_OTHER): Payer: Self-pay

## 2021-02-23 ENCOUNTER — Encounter: Payer: Self-pay | Admitting: Family Medicine

## 2021-02-23 ENCOUNTER — Other Ambulatory Visit: Payer: Self-pay

## 2021-02-23 VITALS — BP 105/74 | HR 105 | Ht 67.0 in

## 2021-02-23 DIAGNOSIS — I700 Atherosclerosis of aorta: Secondary | ICD-10-CM | POA: Diagnosis present

## 2021-02-23 DIAGNOSIS — Z79899 Other long term (current) drug therapy: Secondary | ICD-10-CM

## 2021-02-23 DIAGNOSIS — J9601 Acute respiratory failure with hypoxia: Secondary | ICD-10-CM

## 2021-02-23 DIAGNOSIS — I82411 Acute embolism and thrombosis of right femoral vein: Secondary | ICD-10-CM | POA: Diagnosis present

## 2021-02-23 DIAGNOSIS — Z888 Allergy status to other drugs, medicaments and biological substances status: Secondary | ICD-10-CM

## 2021-02-23 DIAGNOSIS — E669 Obesity, unspecified: Secondary | ICD-10-CM | POA: Diagnosis not present

## 2021-02-23 DIAGNOSIS — Z6836 Body mass index (BMI) 36.0-36.9, adult: Secondary | ICD-10-CM | POA: Diagnosis not present

## 2021-02-23 DIAGNOSIS — Z8616 Personal history of COVID-19: Secondary | ICD-10-CM | POA: Diagnosis not present

## 2021-02-23 DIAGNOSIS — J341 Cyst and mucocele of nose and nasal sinus: Secondary | ICD-10-CM | POA: Diagnosis not present

## 2021-02-23 DIAGNOSIS — R000 Tachycardia, unspecified: Secondary | ICD-10-CM | POA: Diagnosis not present

## 2021-02-23 DIAGNOSIS — I2693 Single subsegmental pulmonary embolism without acute cor pulmonale: Secondary | ICD-10-CM

## 2021-02-23 DIAGNOSIS — Z8249 Family history of ischemic heart disease and other diseases of the circulatory system: Secondary | ICD-10-CM

## 2021-02-23 DIAGNOSIS — E538 Deficiency of other specified B group vitamins: Secondary | ICD-10-CM | POA: Diagnosis present

## 2021-02-23 DIAGNOSIS — Z86718 Personal history of other venous thrombosis and embolism: Secondary | ICD-10-CM

## 2021-02-23 DIAGNOSIS — E1159 Type 2 diabetes mellitus with other circulatory complications: Secondary | ICD-10-CM | POA: Diagnosis present

## 2021-02-23 DIAGNOSIS — R0602 Shortness of breath: Secondary | ICD-10-CM | POA: Diagnosis not present

## 2021-02-23 DIAGNOSIS — Z86711 Personal history of pulmonary embolism: Secondary | ICD-10-CM

## 2021-02-23 DIAGNOSIS — R55 Syncope and collapse: Secondary | ICD-10-CM

## 2021-02-23 DIAGNOSIS — Z28310 Unvaccinated for covid-19: Secondary | ICD-10-CM | POA: Diagnosis not present

## 2021-02-23 DIAGNOSIS — Z20822 Contact with and (suspected) exposure to covid-19: Secondary | ICD-10-CM | POA: Diagnosis present

## 2021-02-23 DIAGNOSIS — Z8701 Personal history of pneumonia (recurrent): Secondary | ICD-10-CM

## 2021-02-23 DIAGNOSIS — R5383 Other fatigue: Secondary | ICD-10-CM | POA: Diagnosis not present

## 2021-02-23 DIAGNOSIS — I2694 Multiple subsegmental pulmonary emboli without acute cor pulmonale: Secondary | ICD-10-CM | POA: Diagnosis not present

## 2021-02-23 DIAGNOSIS — R079 Chest pain, unspecified: Secondary | ICD-10-CM

## 2021-02-23 DIAGNOSIS — I2699 Other pulmonary embolism without acute cor pulmonale: Secondary | ICD-10-CM | POA: Diagnosis present

## 2021-02-23 DIAGNOSIS — I6782 Cerebral ischemia: Secondary | ICD-10-CM | POA: Diagnosis not present

## 2021-02-23 DIAGNOSIS — J9811 Atelectasis: Secondary | ICD-10-CM | POA: Diagnosis not present

## 2021-02-23 DIAGNOSIS — K219 Gastro-esophageal reflux disease without esophagitis: Secondary | ICD-10-CM | POA: Diagnosis present

## 2021-02-23 DIAGNOSIS — E6609 Other obesity due to excess calories: Secondary | ICD-10-CM

## 2021-02-23 DIAGNOSIS — I10 Essential (primary) hypertension: Secondary | ICD-10-CM | POA: Diagnosis not present

## 2021-02-23 DIAGNOSIS — Z720 Tobacco use: Secondary | ICD-10-CM | POA: Diagnosis not present

## 2021-02-23 DIAGNOSIS — E876 Hypokalemia: Secondary | ICD-10-CM | POA: Diagnosis present

## 2021-02-23 DIAGNOSIS — G319 Degenerative disease of nervous system, unspecified: Secondary | ICD-10-CM | POA: Diagnosis not present

## 2021-02-23 DIAGNOSIS — R531 Weakness: Secondary | ICD-10-CM | POA: Diagnosis not present

## 2021-02-23 DIAGNOSIS — I2609 Other pulmonary embolism with acute cor pulmonale: Principal | ICD-10-CM | POA: Diagnosis present

## 2021-02-23 DIAGNOSIS — Z66 Do not resuscitate: Secondary | ICD-10-CM | POA: Diagnosis not present

## 2021-02-23 DIAGNOSIS — I82421 Acute embolism and thrombosis of right iliac vein: Secondary | ICD-10-CM | POA: Diagnosis not present

## 2021-02-23 LAB — BASIC METABOLIC PANEL
Anion gap: 13 (ref 5–15)
BUN: 16 mg/dL (ref 8–23)
CO2: 32 mmol/L (ref 22–32)
Calcium: 9.1 mg/dL (ref 8.9–10.3)
Chloride: 93 mmol/L — ABNORMAL LOW (ref 98–111)
Creatinine, Ser: 1.14 mg/dL (ref 0.61–1.24)
GFR, Estimated: >60 mL/min (ref >60)
Glucose, Bld: 166 mg/dL — ABNORMAL HIGH (ref 70–99)
Potassium: 3.1 mmol/L — ABNORMAL LOW (ref 3.5–5.1)
Sodium: 138 mmol/L (ref 135–145)

## 2021-02-23 LAB — CBC WITH DIFFERENTIAL/PLATELET
Abs Immature Granulocytes: 0.05 10*3/uL (ref 0.00–0.07)
Basophils Absolute: 0 10*3/uL (ref 0.0–0.1)
Basophils Relative: 0 %
Eosinophils Absolute: 0 10*3/uL (ref 0.0–0.5)
Eosinophils Relative: 0 %
HCT: 41.9 % (ref 39.0–52.0)
Hemoglobin: 15.3 g/dL (ref 13.0–17.0)
Immature Granulocytes: 1 %
Lymphocytes Relative: 17 %
Lymphs Abs: 1.8 10*3/uL (ref 0.7–4.0)
MCH: 35.8 pg — ABNORMAL HIGH (ref 26.0–34.0)
MCHC: 36.5 g/dL — ABNORMAL HIGH (ref 30.0–36.0)
MCV: 98.1 fL (ref 80.0–100.0)
Monocytes Absolute: 0.6 10*3/uL (ref 0.1–1.0)
Monocytes Relative: 6 %
Neutro Abs: 7.8 10*3/uL — ABNORMAL HIGH (ref 1.7–7.7)
Neutrophils Relative %: 76 %
Platelets: 243 10*3/uL (ref 150–400)
RBC: 4.27 MIL/uL (ref 4.22–5.81)
RDW: 12.7 % (ref 11.5–15.5)
WBC: 10.3 10*3/uL (ref 4.0–10.5)
nRBC: 0 % (ref 0.0–0.2)

## 2021-02-23 LAB — BRAIN NATRIURETIC PEPTIDE: B Natriuretic Peptide: 611.4 pg/mL — ABNORMAL HIGH (ref 0.0–100.0)

## 2021-02-23 LAB — ECHOCARDIOGRAM COMPLETE
Area-P 1/2: 4.17 cm2
Height: 67 in
S' Lateral: 2.6 cm
Weight: 3680 oz

## 2021-02-23 LAB — HEPARIN LEVEL (UNFRACTIONATED): Heparin Unfractionated: 0.93 IU/mL — ABNORMAL HIGH (ref 0.30–0.70)

## 2021-02-23 LAB — RESP PANEL BY RT-PCR (FLU A&B, COVID) ARPGX2
Influenza A by PCR: NEGATIVE
Influenza B by PCR: NEGATIVE
SARS Coronavirus 2 by RT PCR: NEGATIVE

## 2021-02-23 LAB — HEPATIC FUNCTION PANEL
ALT: 24 U/L (ref 0–44)
AST: 44 U/L — ABNORMAL HIGH (ref 15–41)
Albumin: 3.1 g/dL — ABNORMAL LOW (ref 3.5–5.0)
Alkaline Phosphatase: 55 U/L (ref 38–126)
Bilirubin, Direct: 0.4 mg/dL — ABNORMAL HIGH (ref 0.0–0.2)
Indirect Bilirubin: 0.8 mg/dL (ref 0.3–0.9)
Total Bilirubin: 1.2 mg/dL (ref 0.3–1.2)
Total Protein: 6.1 g/dL — ABNORMAL LOW (ref 6.5–8.1)

## 2021-02-23 LAB — TROPONIN I (HIGH SENSITIVITY)
Troponin I (High Sensitivity): 129 ng/L (ref <18)
Troponin I (High Sensitivity): 132 ng/L (ref <18)

## 2021-02-23 LAB — LIPASE, BLOOD: Lipase: 25 U/L (ref 11–51)

## 2021-02-23 MED ORDER — PANTOPRAZOLE SODIUM 40 MG PO TBEC
40.0000 mg | DELAYED_RELEASE_TABLET | Freq: Every day | ORAL | Status: DC
  Administered 2021-02-23 – 2021-02-25 (×3): 40 mg via ORAL
  Filled 2021-02-23 (×3): qty 1

## 2021-02-23 MED ORDER — HEPARIN BOLUS VIA INFUSION
5000.0000 [IU] | Freq: Once | INTRAVENOUS | Status: AC
  Administered 2021-02-23: 5000 [IU] via INTRAVENOUS

## 2021-02-23 MED ORDER — POTASSIUM CHLORIDE CRYS ER 20 MEQ PO TBCR
40.0000 meq | EXTENDED_RELEASE_TABLET | ORAL | Status: AC
Start: 2021-02-23 — End: 2021-02-23
  Administered 2021-02-23 (×2): 40 meq via ORAL
  Filled 2021-02-23 (×2): qty 2

## 2021-02-23 MED ORDER — IOHEXOL 350 MG/ML SOLN
100.0000 mL | Freq: Once | INTRAVENOUS | Status: AC | PRN
  Administered 2021-02-23: 100 mL via INTRAVENOUS

## 2021-02-23 MED ORDER — HYDRALAZINE HCL 20 MG/ML IJ SOLN: 5.0000 mg | INTRAMUSCULAR | Status: DC | PRN

## 2021-02-23 MED ORDER — ACETAMINOPHEN 650 MG RE SUPP: 650.0000 mg | Freq: Four times a day (QID) | RECTAL | Status: DC | PRN

## 2021-02-23 MED ORDER — BISACODYL 5 MG PO TBEC: 5.0000 mg | DELAYED_RELEASE_TABLET | Freq: Every day | ORAL | Status: DC | PRN

## 2021-02-23 MED ORDER — ONDANSETRON HCL 4 MG/2ML IJ SOLN: 4.0000 mg | Freq: Four times a day (QID) | INTRAMUSCULAR | Status: DC | PRN

## 2021-02-23 MED ORDER — HEPARIN (PORCINE) 25000 UT/250ML-% IV SOLN
1350.0000 [IU]/h | INTRAVENOUS | Status: DC
  Administered 2021-02-23: 1500 [IU]/h via INTRAVENOUS
  Administered 2021-02-24: 1350 [IU]/h via INTRAVENOUS
  Filled 2021-02-23 (×3): qty 250

## 2021-02-23 MED ORDER — ALBUTEROL SULFATE (2.5 MG/3ML) 0.083% IN NEBU: 2.5000 mg | INHALATION_SOLUTION | RESPIRATORY_TRACT | Status: DC | PRN

## 2021-02-23 MED ORDER — HYDROCODONE-ACETAMINOPHEN 5-325 MG PO TABS: 1.0000 | ORAL_TABLET | ORAL | Status: DC | PRN

## 2021-02-23 MED ORDER — POLYETHYLENE GLYCOL 3350 17 G PO PACK: 17.0000 g | PACK | Freq: Every day | ORAL | Status: DC | PRN

## 2021-02-23 MED ORDER — SODIUM CHLORIDE 0.9% FLUSH
3.0000 mL | Freq: Two times a day (BID) | INTRAVENOUS | Status: DC
  Administered 2021-02-23 – 2021-02-25 (×4): 3 mL via INTRAVENOUS

## 2021-02-23 MED ORDER — MORPHINE SULFATE (PF) 2 MG/ML IV SOLN: 2.0000 mg | INTRAVENOUS | Status: DC | PRN

## 2021-02-23 MED ORDER — ONDANSETRON HCL 4 MG PO TABS: 4.0000 mg | ORAL_TABLET | Freq: Four times a day (QID) | ORAL | Status: DC | PRN

## 2021-02-23 MED ORDER — DOCUSATE SODIUM 100 MG PO CAPS
100.0000 mg | ORAL_CAPSULE | Freq: Two times a day (BID) | ORAL | Status: DC
  Administered 2021-02-23 – 2021-02-24 (×2): 100 mg via ORAL
  Filled 2021-02-23 (×2): qty 1

## 2021-02-23 MED ORDER — ACETAMINOPHEN 325 MG PO TABS: 650.0000 mg | ORAL_TABLET | Freq: Four times a day (QID) | ORAL | Status: DC | PRN

## 2021-02-23 NOTE — ED Notes (Signed)
SpO2 91% on r/a in CT scan after near syncopal event, placed on 2lpm [place] once back in room 6. SpO2 98%

## 2021-02-23 NOTE — Progress Notes (Signed)
ANTICOAGULATION CONSULT NOTE - Initial Consult  Pharmacy Consult for heparin Indication: pulmonary embolus  Allergies  Allergen Reactions   Cialis [Tadalafil] Other (See Comments)    Not allergic 02/23/21   Viagra [Sildenafil Citrate] Other (See Comments)    heartburn    Patient Measurements: Height: 5' 7.01" (170.2 cm) Weight: 104.7 kg (230 lb 13.2 oz) IBW/kg (Calculated) : 66.12 Heparin Dosing Weight: 89.5kg  Vital Signs: Temp: 98.3 F (36.8 C) (07/29 2050) Temp Source: Oral (07/29 2050) BP: 85/57 (07/29 2050) Pulse Rate: 94 (07/29 1516)  Labs: Recent Labs    02/23/21 1015 02/23/21 1217 02/23/21 2013  HGB 15.3  --   --   HCT 41.9  --   --   PLT 243  --   --   HEPARINUNFRC  --   --  0.93*  CREATININE 1.14  --   --   TROPONINIHS 129* 132*  --      Estimated Creatinine Clearance: 63.5 mL/min (by C-G formula based on SCr of 1.14 mg/dL).   Medical History: Past Medical History:  Diagnosis Date   Colon polyp    GERD (gastroesophageal reflux disease)    Hemorrhoid    Hyperplasia of prostate    Hypertension    Other and unspecified hyperlipidemia    Other testicular hypofunction    Pneumonia    Pulmonary embolism (HCC)    Sinus bradycardia     Medications:  Infusions:   heparin 1,500 Units/hr (02/23/21 1213)    Assessment: 33 yom presented to the ED with SOB. Found to have a large bilateral PE with right heart strain. To start IV heparin. Pt has a history of PE but is no longer on anticoagulation. CBC is WNL.   Heparin level came back supratherapeutic this PM. We will reduce the rate and check a level in AM.   Goal of Therapy:  Heparin level 0.3-0.7 units/ml Monitor platelets by anticoagulation protocol: Yes   Plan:  Decrease heparin to 1350 units/hr Check AM heparin level Daily heparin level and CBC  Onnie Boer, PharmD, BCIDP, AAHIVP, CPP Infectious Disease Pharmacist 02/23/2021 9:23 PM

## 2021-02-23 NOTE — ED Triage Notes (Signed)
Pt daughter at bedside, states Pt "felt like his O2 was going down and fell this morning" denies LOC or hitting head. Denies COPD, denies O2 at home. Pt has had loss of appetite, and has "episode of blurred vision, being sweaty and falling out". Pt was sent from family doctors office due to the fall this morning. Pt states the dizziness has been going on "for weeks" and is on lasix.

## 2021-02-23 NOTE — Progress Notes (Signed)
  Echocardiogram 2D Echocardiogram has been performed.  [name] [name] 02/23/2021, 3:44 PM

## 2021-02-23 NOTE — TOC Benefit Eligibility Note (Signed)
Transition of Care [facility]) Benefit Eligibility Note    Patient Details  Name: [name] [name] MRN: [id number] Date of Birth: 1945-12-20   Medication/Dose: Arne Cleveland  2.5 MG BID  , APRIXABAN : NON-FORMULARY  Covered?: Yes  Tier: 3 Drug  Prescription Coverage Preferred Pharmacy: [facility] [address] , [place] with Person/Company/Phone Number:: ERIC  @  [place] [employer] # [phone]  Co-Pay: $45.00  Prior Approval: No  Deductible: Met (OUT-OF-POCKET : UNMET)  Additional Notes: XARELTO 10 MG BID  : COVER- YES  , CO-PAY- $45.00  , TIER- 3 DRUG  , P/A -NO   and   RIVAROXABAN : NON-FORMULARY    Memory Argue Phone Number: 02/23/2021, 4:48 PM

## 2021-02-23 NOTE — ED Notes (Signed)
ED Provider at bedside. 

## 2021-02-23 NOTE — ED Notes (Signed)
When attempting to call report, primary RN Reonna requested a later call in 10 mins

## 2021-02-23 NOTE — ED Notes (Signed)
Denies CP.

## 2021-02-23 NOTE — ED Provider Notes (Signed)
[facility]ENC[facility] DEPARTMENT Provider Note   CSN: [id number] Arrival date & time: 02/23/21  [id number]     History Chief Complaint  Patient presents with   Shortness of [name] [name] is a 75 y.o. male.  Patient here with generalized weakness, shortness of breath.  History of blood clot in the lung and legs but not on blood thinners.  Diagnosed with that after COVID earlier in the year.  Denies any bloody stools or black stools.  Has had generalized weakness and has had episodes have hypoxia the last 2 days.  However no hypoxia this morning.  Brought here by family member.  The history is provided by the patient.  Shortness of Breath Severity:  Moderate Onset quality:  Gradual Timing:  Constant Progression:  Unchanged Chronicity:  New Context: activity (generalized weakness)   Relieved by:  Nothing Worsened by:  Nothing Associated symptoms: no abdominal pain, no chest pain, no cough, no ear pain, no fever, no rash, no sore throat and no vomiting   Risk factors: hx of PE/DVT       Past Medical History:  Diagnosis Date   Colon polyp    GERD (gastroesophageal reflux disease)    Hemorrhoid    Hyperplasia of prostate    Hypertension    Other and unspecified hyperlipidemia    Other testicular hypofunction    Pneumonia    Pulmonary embolism (HCC)    Sinus bradycardia     Patient Active Problem List   Diagnosis Date Noted   Pulmonary embolism, bilateral ([place]) 08/15/2020   Acute deep vein thrombosis (DVT) of distal vein of both lower extremities ([place]) 08/15/2020   History of pulmonary embolus (PE) 05/31/2020   Aortic atherosclerosis ([place]) 03/07/2017   B12 deficiency 12/31/2015   Hyperlipidemia 07/01/2014   Erectile dysfunction 02/28/2014   Vitamin D deficiency 12/14/2012   Testosterone deficiency    Hypertension, essential    Sinus bradycardia    GERD (gastroesophageal reflux disease)    Colon polyp history    BPH (benign prostatic  hyperplasia)     Past Surgical History:  Procedure Laterality Date   APPENDECTOMY     HAND SURGERY Right        Family History  Problem Relation Age of Onset   Heart attack Mother    Stroke Mother    Diabetes Father    Diabetes Daughter     Social History   Tobacco Use   Smoking status: Former    Types: Cigars    Quit date: 2015    Years since quitting: 7.5   Smokeless tobacco: Current    Types: Chew   Tobacco comments:    only 1 cigar on occassion  Vaping Use   Vaping Use: Never used  Substance Use Topics   Alcohol use: Yes    Comment: daily   Drug use: No    Home Medications Prior to Admission medications   Medication Sig Start Date End Date Taking? Authorizing Provider  B Complex Vitamins (VITAMIN B COMPLEX PO) Take by mouth.    [provider]  omeprazole (PRILOSEC) 20 MG capsule Take 1 capsule (20 mg total) by mouth daily. 04/12/20   Dettinger, Fransisca Kaufmann, MD  amLODipine (NORVASC) 5 MG tablet Take 1 tablet (5 mg total) by mouth daily. Patient not taking: Reported on 05/31/2020 04/12/20 05/31/20  Dettinger, Fransisca Kaufmann, MD    Allergies    Cialis [tadalafil] and Viagra [sildenafil citrate]  Review of Systems  Review of Systems  Constitutional:  Negative for chills and fever.  HENT:  Negative for ear pain and sore throat.   Eyes:  Negative for pain and visual disturbance.  Respiratory:  Positive for shortness of breath. Negative for cough.   Cardiovascular:  Negative for chest pain and palpitations.  Gastrointestinal:  Negative for abdominal pain and vomiting.  Genitourinary:  Negative for dysuria and hematuria.  Musculoskeletal:  Negative for arthralgias and back pain.  Skin:  Negative for color change and rash.  Neurological:  Negative for seizures and syncope.  All other systems reviewed and are negative.  Physical Exam Updated Vital Signs  ED Triage Vitals  Enc Vitals Group     BP 02/23/21 0953 (!) 126/94     Pulse Rate 02/23/21 0953 95      Resp 02/23/21 0953 (!) 21     Temp 02/23/21 0953 98.3 F (36.8 C)     Temp src --      SpO2 02/23/21 0952 95 %     Weight 02/23/21 0955 232 lb 8 oz (105.5 kg)     Height 02/23/21 0955 '5\' 7"'$  (1.702 m)     Head Circumference --      Peak Flow --      Pain Score 02/23/21 0954 0     Pain Loc --      Pain Edu? --      Excl. in [place]? --      Physical Exam Vitals and nursing note reviewed.  Constitutional:      General: He is not in acute distress.    Appearance: He is well-developed. He is not ill-appearing.  HENT:     Head: Normocephalic and atraumatic.     Mouth/Throat:     Mouth: Mucous membranes are moist.  Eyes:     Conjunctiva/sclera: Conjunctivae normal.     Pupils: Pupils are equal, round, and reactive to light.  Cardiovascular:     Rate and Rhythm: Normal rate and regular rhythm.     Pulses: Normal pulses.     Heart sounds: Normal heart sounds. No murmur heard. Pulmonary:     Effort: Pulmonary effort is normal. No respiratory distress.     Breath sounds: Normal breath sounds. No decreased breath sounds or wheezing.  Abdominal:     Palpations: Abdomen is soft.     Tenderness: There is no abdominal tenderness.  Musculoskeletal:     Cervical back: Normal range of motion and neck supple.     Right lower leg: Edema present.     Left lower leg: Edema present.  Skin:    General: Skin is warm and dry.     Capillary Refill: Capillary refill takes less than 2 seconds.  Neurological:     General: No focal deficit present.     Mental Status: He is alert.  Psychiatric:        Mood and Affect: Mood normal.    ED Results / Procedures / Treatments   Labs (all labs ordered are listed, but only abnormal results are displayed) Labs Reviewed  CBC WITH DIFFERENTIAL/PLATELET - Abnormal; Notable for the following components:      Result Value   MCH 35.8 (*)    MCHC 36.5 (*)    Neutro Abs 7.8 (*)    All other components within normal limits  BASIC METABOLIC PANEL - Abnormal;  Notable for the following components:   Potassium 3.1 (*)    Chloride 93 (*)    Glucose, Bld 166 (*)  All other components within normal limits  HEPATIC FUNCTION PANEL - Abnormal; Notable for the following components:   Total Protein 6.1 (*)    Albumin 3.1 (*)    AST 44 (*)    Bilirubin, Direct 0.4 (*)    All other components within normal limits  BRAIN NATRIURETIC PEPTIDE - Abnormal; Notable for the following components:   B Natriuretic Peptide 611.4 (*)    All other components within normal limits  TROPONIN I (HIGH SENSITIVITY) - Abnormal; Notable for the following components:   Troponin I (High Sensitivity) 129 (*)    All other components within normal limits  RESP PANEL BY RT-PCR (FLU A&B, COVID) ARPGX2  LIPASE, BLOOD  URINALYSIS, ROUTINE W REFLEX MICROSCOPIC  HEPARIN LEVEL (UNFRACTIONATED)  TROPONIN I (HIGH SENSITIVITY)    EKG EKG Interpretation  Date/Time:  Friday February 23 2021 09:48:26 EDT Ventricular Rate:  94 PR Interval:  166 QRS Duration: 85 QT Interval:  400 QTC Calculation: 501 R Axis:   228 Text Interpretation: Sinus tachycardia Multiple premature complexes, vent & supraven Markedly posterior QRS axis Borderline T abnormalities, anterior leads Prolonged QT interval Confirmed by Lennice Sites ([id number]) on 02/23/2021 10:01:48 AM  Radiology CT Head Wo Contrast  Result Date: 02/23/2021 CLINICAL DATA:  Weakness EXAM: CT HEAD WITHOUT CONTRAST TECHNIQUE: Contiguous axial images were obtained from the base of the skull through the vertex without intravenous contrast. COMPARISON:  Head CT 09/16/2020 FINDINGS: Brain: No evidence of acute infarction, hemorrhage, hydrocephalus, extra-axial collection or mass lesion/mass effect.The ventricles are normal in size.Scattered subcortical and periventricular white matter hypodensities, nonspecific but likely sequela of chronic small vessel ischemic disease.Mild cerebral atrophy Vascular: No hyperdense vessel or unexpected  calcification. Skull: Normal. Negative for fracture or focal lesion. Sinuses/Orbits: Mucous retention cyst in the left sphenoid sinus. Minimal ethmoid air cell and right maxillary sinus mucosal thickening. Other: None IMPRESSION: No acute intracranial abnormality. Unchanged sequela of chronic small vessel ischemic disease and mild cerebral atrophy. Electronically Signed   By: Maurine Simmering   On: 02/23/2021 12:00   CT Angio Chest PE W and/or Wo Contrast  Result Date: 02/23/2021 CLINICAL DATA:  Shortness of breath. EXAM: CT ANGIOGRAPHY CHEST WITH CONTRAST TECHNIQUE: Multidetector CT imaging of the chest was performed using the standard protocol during bolus administration of intravenous contrast. Multiplanar CT image reconstructions and MIPs were obtained to evaluate the vascular anatomy. CONTRAST:  128m OMNIPAQUE IOHEXOL 350 MG/ML SOLN COMPARISON:  August 15, 2020. FINDINGS: Cardiovascular: Large bilateral pulmonary emboli are noted which extend into the upper and lower lobe branches. RV/LV ratio of 2.0 is noted suggesting right heart strain. No pericardial effusion is noted. Atherosclerosis of thoracic aorta is noted without aneurysm formation. Mediastinum/Nodes: No enlarged mediastinal, hilar, or axillary lymph nodes. Thyroid gland, trachea, and esophagus demonstrate no significant findings. Lungs/Pleura: No pneumothorax or pleural effusion is noted. Minimal bibasilar subsegmental atelectasis is noted. Upper Abdomen: No acute abnormality. Musculoskeletal: No chest wall abnormality. No acute or significant osseous findings. Review of the MIP images confirms the above findings. IMPRESSION: Large bilateral pulmonary emboli are noted. Positive for acute PE with CT evidence of right heart strain (RV/LV Ratio = 2.0) consistent with at least submassive (intermediate risk) PE. The presence of right heart strain has been associated with an increased risk of morbidity and mortality. Please refer to the "PE Focused" order  set in EPIC. Critical Value/emergent results were called by telephone at the time of interpretation on 02/23/2021 at 12:02 pm to provider Jahira Swiss , who  verbally acknowledged these results. Aortic Atherosclerosis (ICD10-I70.0). Electronically Signed   By: Marijo Conception M.D.   On: 02/23/2021 12:02   DG Chest Portable 1 View  Result Date: 02/23/2021 CLINICAL DATA:  Shortness of breath EXAM: PORTABLE CHEST 1 VIEW COMPARISON:  Chest x-ray dated May 31, 2020 FINDINGS: Cardiac and mediastinal contours are within normal limits for AP technique. Click Lungs are clear.  No large pleural effusion or pneumothorax. IMPRESSION: Clear lungs. Electronically Signed   By: Yetta Glassman MD   On: 02/23/2021 10:39    Procedures .Critical Care  Date/Time: 02/23/2021 12:19 PM Performed by: Lennice Sites, DO Authorized by: Lennice Sites, DO   Critical care provider statement:    Critical care time (minutes):  40   Critical care was necessary to treat or prevent imminent or life-threatening deterioration of the following conditions: acute PE.   Critical care was time spent personally by me on the following activities:  Blood draw for specimens, development of treatment plan with patient or surrogate, discussions with primary provider, evaluation of patient's response to treatment, examination of patient, obtaining history from patient or surrogate, ordering and performing treatments and interventions, re-evaluation of patient's condition, review of old charts, pulse oximetry, ordering and review of radiographic studies and ordering and review of laboratory studies   I assumed direction of critical care for this patient from another provider in my specialty: no     Care discussed with: admitting provider     Medications Ordered in ED Medications  heparin ADULT infusion 100 units/mL (25000 units/268m) (1,500 Units/hr Intravenous New Bag/Given 02/23/21 1213)  iohexol (OMNIPAQUE) 350 MG/ML injection 100 mL  (100 mLs Intravenous Contrast Given 02/23/21 1116)  heparin bolus via infusion 5,000 Units (5,000 Units Intravenous Bolus from Bag 02/23/21 1213)    ED Course  I have reviewed the triage vital signs and the nursing notes.  Pertinent labs & imaging results that were available during my care of the patient were reviewed by me and considered in my medical decision making (see chart for details).    MDM Rules/Calculators/A&P                           TSHAQWAN HOAKis a 75year old male with history of PE no longer on blood thinners, hypertension, reflux who presents the ED with shortness of breath, generalized weakness.  Has felt generally weak the last several days.  Uses a wheelchair intermittently at times.  Denies any melena or hematochezia.  Denies any cough or sputum production.  Family member states that they have been checking his oxygen the last several days and he has had some low oxygen levels.  He arrives here with normal vitals.  No fever, oxygen saturation normal.  He denies any infectious symptoms, pain.  Sounds like he had a fall today.  Neurologically he appears intact.  We will get basic labs, head CT, PE study.  Will evaluate for COVID, anemia, cardiac process.  EKG shows sinus rhythm.  No obvious ischemic changes.  Does not have any chest pain.  Radiology called me on the phone, PE scan shows large bilateral pulmonary emboli.  Positive for acute PE with evidence of right heart strain.  BNP and troponin mildly elevated.  Patient to be started on heparin bolus and infusion.  Vital signs are stable.  Blood pressure 110/80.  Placed on 2 L of oxygen for comfort.  Not tachycardic.  Suspect that he likely has a  right lower leg DVT as well because he does have some asymmetric swelling although wife states that has been there for a while.  Will admit to medicine for further care.  This chart was dictated using voice recognition software.  Despite best efforts to proofread,  errors can occur  which can change the documentation meaning.   Final Clinical Impression(s) / ED Diagnoses Final diagnoses:  Acute pulmonary embolism with acute cor pulmonale, unspecified pulmonary embolism type [facility])    Rx / DC Orders ED Discharge Orders     None        Lennice Sites, DO 02/23/21 1219

## 2021-02-23 NOTE — Progress Notes (Signed)
Acute Office Visit  Subjective:    Patient ID: [name] [name], male    DOB: 13-Dec-1945, 75 y.o.   MRN: [id number]  Chief Complaint  Patient presents with   Shortness of Breath    O2 levels low at home   Extremity Weakness    HPI Patient is in today for shortness of breath, lightheadedness, and weakness. He is with his daughter. [name] reports that he has not been feeling well for a few week. His symptoms really increased over the last few days and have continued to worsen. He reports that his oxygen level at home has been around 88%. He is feeling short of breath with any activity. Since last night he feels extremely weak and lightheaded if he stands. He will also start profusely sweating. If he walks a few feet he nearly passes out and has to sit back down. He fell this morning because of this. He felt like his legs just gave out. He did not hit his head and denies LOC. His heart rate has been running 100-140. He did have some chest pain for a minute or so this morning. He has not had much of an appetite.    Past Medical History:  Diagnosis Date   Colon polyp    GERD (gastroesophageal reflux disease)    Hemorrhoid    Hyperplasia of prostate    Hypertension    Other and unspecified hyperlipidemia    Other testicular hypofunction    Pneumonia    Pulmonary embolism (HCC)    Sinus bradycardia     Past Surgical History:  Procedure Laterality Date   APPENDECTOMY     HAND SURGERY Right     Family History  Problem Relation Age of Onset   Heart attack Mother    Stroke Mother    Diabetes Father    Diabetes Daughter     Social History   Socioeconomic History   Marital status: Married    Spouse name: Psychiatrist   Number of children: 3   Years of education: 10   Highest education level: 10th grade  Occupational History   Occupation: retired  Tobacco Use   Smoking status: Former    Types: Cigars    Quit date: 2015    Years since quitting: 7.5   Smokeless tobacco:  Current    Types: Chew   Tobacco comments:    only 1 cigar on [employer] Use   Vaping Use: Never used  Substance and Sexual Activity   Alcohol use: Yes    Comment: daily   Drug use: No   Sexual activity: Not Currently  Other Topics Concern   Not on file  Social History Narrative   Not on file   Social Determinants of Health   Financial Resource Strain: Not on file  Food Insecurity: Not on file  Transportation Needs: Not on file  Physical Activity: Not on file  Stress: Not on file  Social Connections: Not on file  Intimate Partner Violence: Not on file    Outpatient Medications Prior to Visit  Medication Sig Dispense Refill   B Complex Vitamins (VITAMIN B COMPLEX PO) Take by mouth.     omeprazole (PRILOSEC) 20 MG capsule Take 1 capsule (20 mg total) by mouth daily. 90 capsule 3   vitamin C (ASCORBIC ACID) 500 MG tablet Take 500 mg by mouth daily.     No facility-administered medications prior to visit.    Allergies  Allergen Reactions   Cialis [Tadalafil]  Viagra [Sildenafil Citrate] Other (See Comments)    heartburn    Review of Systems As per HPI.     Objective:    Physical Exam Vitals and nursing note reviewed.  Constitutional:      Appearance: He is ill-appearing.     Comments: Alert and oriented x3, does not appear confused however patient is unable to keep his eyes open during the visit.   HENT:     Head: Normocephalic and atraumatic.  Cardiovascular:     Rate and Rhythm: Regular rhythm. Tachycardia present.     Heart sounds: Normal heart sounds.  Pulmonary:     Effort: Tachypnea present.     Breath sounds: Normal breath sounds.  Skin:    General: Skin is warm and dry.  Neurological:     Mental Status: He is oriented to person, place, and time.     Motor: Weakness (generalized) present.    BP 105/74   Pulse (!) 105   Ht _0  (1.702 m)   SpO2 93%   BMI 36.02 kg/m  Wt Readings from Last 3 Encounters:  02/15/21 230 lb (104.3 kg)   02/12/21 231 lb (104.8 kg)  01/12/21 239 lb (108.4 kg)    There are no preventive care reminders to display for this patient.  There are no preventive care reminders to display for this patient.   Lab Results  Component Value Date   TSH 3.430 12/29/2015   Lab Results  Component Value Date   WBC 8.7 02/12/2021   HGB 15.5 02/12/2021   HCT 43.6 02/12/2021   MCV 98 (H) 02/12/2021   PLT 154 02/12/2021   Lab Results  Component Value Date   NA 142 02/12/2021   K 3.9 02/12/2021   CO2 36 (H) 02/12/2021   GLUCOSE 144 (H) 02/12/2021   BUN 9 02/12/2021   CREATININE 1.05 02/12/2021   BILITOT 1.5 (H) 12/12/2020   ALKPHOS 45 12/12/2020   AST 121 (H) 12/12/2020   ALT 75 (H) 12/12/2020   PROT 6.7 12/12/2020   ALBUMIN 3.7 12/12/2020   CALCIUM 9.8 02/12/2021   ANIONGAP 12 12/12/2020   EGFR 74 02/12/2021   Lab Results  Component Value Date   CHOL 152 10/11/2020   Lab Results  Component Value Date   HDL 52 10/11/2020   Lab Results  Component Value Date   LDLCALC 80 10/11/2020   Lab Results  Component Value Date   TRIG 108 10/11/2020   Lab Results  Component Value Date   CHOLHDL 2.9 10/11/2020   Lab Results  Component Value Date   HGBA1C 5.6% 10/11/2013       Assessment & Plan:   [name] was seen today for shortness of breath and extremity weakness.  Diagnoses and all orders for this visit:  Pre-syncope Weakness Fatigue, unspecified type Chest pain, unspecified type Shortness of breath Tachycardia  [name] presents today with worsening weakness and shortness of breath. He does not appear well at his visit today. Also report chest pain earlier this morning. BP is low today at 105/74 with increased heart rate. He was unable to stand at visit today and normally does not have ambulation difficulties. Also reports presyncope when standing, this resulted in a fall this morning. Given the above symptoms with his hx of bilateral PE and bilateral DVT, HTN, and aortic  atherosclerosis, I recommended that he be evaluated in his ER today. They declined EMS transport today. His daughter will take him in her car this morning after this  visit.   The patient indicates understanding of these issues and agrees with the plan.  Gwenlyn Perking, FNP

## 2021-02-23 NOTE — H&P (Signed)
History and Physical    [name] [name] [id number] DOB: 1946-03-18 DOA: 02/23/2021  PCP: Dettinger, Fransisca Kaufmann, MD Consultants:  Loanne Drilling - pulmonology; Carlean Purl - GI; Aluisio - orthopedics Patient coming from:  Home - lives with his grandson; NOK: Children, [phone]  Chief Complaint: SOB  HPI: [name] [name] is a 75 y.o. male with medical history significant of HTN and PE presenting with SOB.  This PE was thought to be provoked in the setting of COVID-19 infection and so he was taken off [facility] in April.  Since then, he has had back and knee pain and periodic leg swelling.  He was in the ER once for back pain.  He also has had leg swelling and some SOB and increased Lasix.  His R leg is still swollen but somewhat better.  He was having little black out episodes - was conscious but barely able to walk from one room to another.  This AM, he got up twice and couldn't get out of bed.  The third time, he walked a bit and fell in the dining room.  His O2 level had been low and his pulse high so they already had an appointment at his PCP office.  The last 2 days his O2 has been better.  He is just too tired to ambulate through the house.  The PCP sent to [facility] and then they came to [facility].  He was still having trouble with his back and knees even while on the [facility]    ED Course: [facility] to [facility] transfer, per Dr. Roosevelt Locks:  Hx of provoked DVT and PE in Nov 2020 2/2 COVID, off Eliquis came with submassive PE. No hypotension, no tachycardia, no hypoxia. CTA showed signs of right heart strain 2:1, Trop elevated.   Accepted to step down, on heparin drip.   Review of Systems: As per HPI; otherwise review of systems reviewed and negative.   Ambulatory Status:  Ambulates without assistance  COVID Vaccine Status:  None  Past Medical History:  Diagnosis Date   Colon polyp    GERD (gastroesophageal reflux disease)    Hemorrhoid    Hyperplasia of prostate    Hypertension    Other and unspecified hyperlipidemia     Other testicular hypofunction    Pneumonia    Pulmonary embolism (HCC)    Sinus bradycardia     Past Surgical History:  Procedure Laterality Date   APPENDECTOMY     HAND SURGERY Right     Social History   Socioeconomic History   Marital status: Married    Spouse name: Psychiatrist   Number of children: 3   Years of education: 10   Highest education level: 10th grade  Occupational History   Occupation: retired  Tobacco Use   Smoking status: Former    Types: Cigars    Quit date: 2015    Years since quitting: 7.5   Smokeless tobacco: Current    Types: Chew   Tobacco comments:    only 1 cigar on [employer] Use   Vaping Use: Never used  Substance and Sexual Activity   Alcohol use: Yes    Comment: daily, 1/2 pint   Drug use: No   Sexual activity: Not Currently  Other Topics Concern   Not on file  Social History Narrative   Not on file   Social Determinants of Health   Financial Resource Strain: Not on file  Food Insecurity: Not on file  Transportation Needs: Not on file  Physical Activity: Not on file  Stress: Not on file  Social Connections: Not on file  Intimate Partner Violence: Not on file    Allergies  Allergen Reactions   Cialis [Tadalafil] Other (See Comments)    Not allergic 02/23/21   Viagra [Sildenafil Citrate] Other (See Comments)    heartburn    Family History  Problem Relation Age of Onset   Heart attack Mother    Stroke Mother    Diabetes Father    Diabetes Daughter     Prior to Admission medications   Medication Sig Start Date End Date Taking? Authorizing Provider  B Complex Vitamins (VITAMIN B COMPLEX PO) Take by mouth.    [provider]  omeprazole (PRILOSEC) 20 MG capsule Take 1 capsule (20 mg total) by mouth daily. 04/12/20   Dettinger, Fransisca Kaufmann, MD  amLODipine (NORVASC) 5 MG tablet Take 1 tablet (5 mg total) by mouth daily. Patient not taking: Reported on 05/31/2020 04/12/20 05/31/20  Dettinger, Fransisca Kaufmann, MD     Physical Exam: Vitals:   02/23/21 1258 02/23/21 1315 02/23/21 1454 02/23/21 1516  BP: 104/73 1'03/85 99/66 98/69 '$  Pulse: 96 89 91 94  Resp: '16 18 20 20  '$ Temp:    98.7 F (37.1 C)  TempSrc:    Oral  SpO2: 100% 100% 98%   Weight: 104.3 kg   104.7 kg  Height: '5\' 7"'$  (1.702 m)   5' 7.01" (1.702 m)     General:  Appears calm and comfortable and is in NAD, on 2L [place] O2 Eyes:   EOMI, normal lids, iris ENT:  grossly normal hearing, lips & tongue, mmm; edentulous Neck:  no LAD, masses or thyromegaly Cardiovascular:  RRR, no m/r/g.  Respiratory:   CTA bilaterally with no wheezes/rales/rhonchi.  Normal respiratory effort. Abdomen:  soft, NT, ND Skin:  no rash or induration seen on limited exam Musculoskeletal:  grossly normal tone BUE/BLE, good ROM, no bony abnormality Lower extremity: R calf is significantly larger than L and there appear to be posterior cords present.  Limited foot exam with no ulcerations.  2+ distal pulses.   Psychiatric:  blunted mood and affect, speech fluent and appropriate, AOx3 Neurologic:  CN 2-12 grossly intact, moves all extremities in coordinated fashion    Radiological Exams on Admission: Independently reviewed - see discussion in A/P where applicable  CT Head Wo Contrast  Result Date: 02/23/2021 CLINICAL DATA:  Weakness EXAM: CT HEAD WITHOUT CONTRAST TECHNIQUE: Contiguous axial images were obtained from the base of the skull through the vertex without intravenous contrast. COMPARISON:  Head CT 09/16/2020 FINDINGS: Brain: No evidence of acute infarction, hemorrhage, hydrocephalus, extra-axial collection or mass lesion/mass effect.The ventricles are normal in size.Scattered subcortical and periventricular white matter hypodensities, nonspecific but likely sequela of chronic small vessel ischemic disease.Mild cerebral atrophy Vascular: No hyperdense vessel or unexpected calcification. Skull: Normal. Negative for fracture or focal lesion. Sinuses/Orbits:  Mucous retention cyst in the left sphenoid sinus. Minimal ethmoid air cell and right maxillary sinus mucosal thickening. Other: None IMPRESSION: No acute intracranial abnormality. Unchanged sequela of chronic small vessel ischemic disease and mild cerebral atrophy. Electronically Signed   By: Maurine Simmering   On: 02/23/2021 12:00   CT Angio Chest PE W and/or Wo Contrast  Result Date: 02/23/2021 CLINICAL DATA:  Shortness of breath. EXAM: CT ANGIOGRAPHY CHEST WITH CONTRAST TECHNIQUE: Multidetector CT imaging of the chest was performed using the standard protocol during bolus administration of intravenous contrast. Multiplanar CT image reconstructions and MIPs  were obtained to evaluate the vascular anatomy. CONTRAST:  187m OMNIPAQUE IOHEXOL 350 MG/ML SOLN COMPARISON:  August 15, 2020. FINDINGS: Cardiovascular: Large bilateral pulmonary emboli are noted which extend into the upper and lower lobe branches. RV/LV ratio of 2.0 is noted suggesting right heart strain. No pericardial effusion is noted. Atherosclerosis of thoracic aorta is noted without aneurysm formation. Mediastinum/Nodes: No enlarged mediastinal, hilar, or axillary lymph nodes. Thyroid gland, trachea, and esophagus demonstrate no significant findings. Lungs/Pleura: No pneumothorax or pleural effusion is noted. Minimal bibasilar subsegmental atelectasis is noted. Upper Abdomen: No acute abnormality. Musculoskeletal: No chest wall abnormality. No acute or significant osseous findings. Review of the MIP images confirms the above findings. IMPRESSION: Large bilateral pulmonary emboli are noted. Positive for acute PE with CT evidence of right heart strain (RV/LV Ratio = 2.0) consistent with at least submassive (intermediate risk) PE. The presence of right heart strain has been associated with an increased risk of morbidity and mortality. Please refer to the "PE Focused" order set in EPIC. Critical Value/emergent results were called by telephone at the time of  interpretation on 02/23/2021 at 12:02 pm to provider ADAM CURATOLO , who verbally acknowledged these results. Aortic Atherosclerosis (ICD10-I70.0). Electronically Signed   By: JMarijo ConceptionM.D.   On: 02/23/2021 12:02   DG Chest Portable 1 View  Result Date: 02/23/2021 CLINICAL DATA:  Shortness of breath EXAM: PORTABLE CHEST 1 VIEW COMPARISON:  Chest x-ray dated May 31, 2020 FINDINGS: Cardiac and mediastinal contours are within normal limits for AP technique. Click Lungs are clear.  No large pleural effusion or pneumothorax. IMPRESSION: Clear lungs. Electronically Signed   By: LYetta GlassmanMD   On: 02/23/2021 10:39   ECHOCARDIOGRAM COMPLETE  Result Date: 02/23/2021    ECHOCARDIOGRAM REPORT   Patient Name:   [name] WILLEVERDate of Exam: 02/23/2021 Medical Rec #:  [id number]      Height:       67.0 in Accession #:    [id number]     Weight:       230.0 lb Date of Birth:  [birth year]     BSA:          2.146 m Patient Age:    89years        BP:           98/69 mmHg Patient Gender: M               HR:           88 bpm. Exam Location:  Inpatient Procedure: 2D Echo STAT ECHO Indications:    pulmonary embolus  History:        Patient has prior history of Echocardiogram examinations, most                 recent 05/31/2020. Risk Factors:Hypertension and Dyslipidemia.  Sonographer:    LJohny ChessReferring Phys: 2[place] 1. Left ventricular ejection fraction, by estimation, is 55 to 60%. The left ventricle has normal function. The left ventricle has no regional wall motion abnormalities. There is mild concentric left ventricular hypertrophy. Left ventricular diastolic parameters are consistent with Grade I diastolic dysfunction (impaired relaxation).  2. McConnell's sign present with akineiss of the basal to mid right ventricular free wall with sparing of the apex. Findings consistent with acute pulmonary embolism. Right ventricular systolic function is severely reduced. The  right ventricular size is  severely enlarged. There is moderately elevated pulmonary artery systolic  pressure.  3. The mitral valve is normal in structure. Trivial mitral valve regurgitation. No evidence of mitral stenosis.  4. The aortic valve is normal in structure. There is moderate calcification of the aortic valve. There is moderate thickening of the aortic valve. Aortic valve regurgitation is not visualized. No aortic stenosis is present.  5. The inferior vena cava is normal in size with <50% respiratory variability, suggesting right atrial pressure of 8 mmHg. FINDINGS  Left Ventricle: Left ventricular ejection fraction, by estimation, is 55 to 60%. The left ventricle has normal function. The left ventricle has no regional wall motion abnormalities. The left ventricular internal cavity size was normal in size. There is  mild concentric left ventricular hypertrophy. Left ventricular diastolic parameters are consistent with Grade I diastolic dysfunction (impaired relaxation). Indeterminate filling pressures. Right Ventricle: McConnell's sign present with akineiss of the basal to mid right ventricular free wall with sparing of the apex. Findings consistent with acute pulmonary embolism. The right ventricular size is severely enlarged. No increase in right ventricular wall thickness. Right ventricular systolic function is severely reduced. There is moderately elevated pulmonary artery systolic pressure. The tricuspid regurgitant velocity is 3.22 m/s, and with an assumed right atrial pressure of 8 mmHg, the  estimated right ventricular systolic pressure is [id number] mmHg. Left Atrium: Left atrial size was normal in size. Right Atrium: Right atrial size was normal in size. Pericardium: There is no evidence of pericardial effusion. Mitral Valve: The mitral valve is normal in structure. Trivial mitral valve regurgitation. No evidence of mitral valve stenosis. Tricuspid Valve: The tricuspid valve is normal in structure.  Tricuspid valve regurgitation is mild . No evidence of tricuspid stenosis. Aortic Valve: The aortic valve is normal in structure. There is moderate calcification of the aortic valve. There is moderate thickening of the aortic valve. Aortic valve regurgitation is not visualized. No aortic stenosis is present. Pulmonic Valve: The pulmonic valve was normal in structure. Pulmonic valve regurgitation is not visualized. No evidence of pulmonic stenosis. Aorta: The aortic root is normal in size and structure. Venous: The inferior vena cava is normal in size with less than 50% respiratory variability, suggesting right atrial pressure of 8 mmHg. IAS/Shunts: No atrial level shunt detected by color flow Doppler.  LEFT VENTRICLE PLAX 2D LVIDd:         3.40 cm  Diastology LVIDs:         2.60 cm  LV e' medial:    4.35 cm/s LV PW:         1.05 cm  LV E/e' medial:  10.0 LV IVS:        1.20 cm  LV e' lateral:   5.98 cm/s LVOT diam:     1.60 cm  LV E/e' lateral: 7.3 LV SV:         22 LV SV Index:   10 LVOT Area:     2.01 cm  RIGHT VENTRICLE            IVC RV S prime:     7.94 cm/s  IVC diam: 1.80 cm TAPSE (M-mode): 1.0 cm LEFT ATRIUM           Index       RIGHT ATRIUM           Index LA diam:      2.90 cm 1.35 cm/m  RA Area:     13.80 cm LA Vol (A4C): 24.6 ml 11.46 ml/m RA Volume:   31.10 ml  14.49 ml/m  AORTIC VALVE LVOT Vmax:   63.00 cm/s LVOT Vmean:  43.300 cm/s LVOT VTI:    0.108 m  AORTA Ao Root diam: 3.50 cm Ao Asc diam:  3.30 cm MITRAL VALVE               TRICUSPID VALVE MV Area (PHT): 4.17 cm    TR Peak grad:   41.5 mmHg MV Decel Time: 182 msec    TR Vmax:        322.00 cm/s MV E velocity: 43.70 cm/s MV A velocity: 66.80 cm/s  SHUNTS MV E/A ratio:  0.65        Systemic VTI:  0.11 m                            Systemic Diam: 1.60 cm Skeet Latch MD Electronically signed by Skeet Latch MD Signature Date/Time: 02/23/2021/3:56:33 PM    Final     EKG: Independently reviewed.   0948 - Sinus tachycardia with rate 94;  nonspecific ST changes  1130 - NSR with rate 90; prolonged QTc 516; nonspecific ST changes with no evidence of acute ischemia   Labs on Admission: I have personally reviewed the available labs and imaging studies at the time of the admission.  Pertinent labs:   K+ 3.1 Glucose 166 Albumin 3.1 BNP 611.4 HS troponin 129, 132 WBC 10.3 COVID/flu negative   Assessment/Plan Principal Problem:   Recurrent pulmonary emboli (HCC) Active Problems:   Hypertension, essential   Class 2 obesity due to excess calories with body mass index (BMI) of 36.0 to 36.9 in adult   Recurrent submassive PE -Patient with prior episode of thromboembolic disease in the setting of COVID-19 infection; he was taken off [facility] in April -Now presenting with SOB and near syncope and found to have new large B PE -Will admit on telemetry to progressive care unit -R heart strain on CT; will obtain STAT limited echo, although given his hemodynamic stability he does not appear to need IR consult for thrombectomy at this time -Echo was read with RV dilated and hypokinetic, concerning for significant R heart strain -Will consult PCCM for possible transfer to ICU and/or thrombectomy; if not indicated, TRH will be happy to continue to monitor in progressive care -Initiate anticoagulation - for now, will start treatment-dose heparin -He is likely able to transition to alternative AC agent on Sunday (possibly tomorrow, but given the extensive clot burden maybe wait an additional day) -[place] O2 as needed -We discussed the need for lifelong AC -Will request TOC team consultation to assist with cost analysis of the various DOAC options based on his insurance -The patient understands that thromboembolic disease can be catastrophic and even deadly and that he must be complaint with physician appointments and lifelong anticoagulation.  HTN -He does not appear to be taking medications for this issue at this time   Obesity -Body mass  index is 36.14 kg/m..  -Weight loss should be encouraged -Outpatient PCP/bariatric medicine f/u encouraged     Note: This patient has been tested and is negative for the novel coronavirus COVID-19. The patient has NOT been vaccinated against COVID-19.   Level of care: Progressive DVT prophylaxis: Heparin Code Status:  DNR - confirmed with patient/family Family Communication: Daughter was present throughout evaluation Disposition Plan:  The patient is from: home  Anticipated d/c is to: home without [facility] services   Anticipated d/c date will depend on clinical response to treatment, likely 2-3  days  Patient is currently: acutely ill Consults called: PCCM  Admission status:  Admit - It is my clinical opinion that admission to INPATIENT is reasonable and necessary because of the expectation that this patient will require hospital care that crosses at least 2 midnights to treat this condition based on the medical complexity of the problems presented.  Given the aforementioned information, the predictability of an adverse outcome is felt to be significant.    Karmen Bongo MD [facility]   How to contact the [facility] Attending or Consulting provider [place] or covering provider during after hours [place], for this patient?  Check the care team in [facility] and look for a) attending/consulting [facility] provider listed and b) the [facility] team listed Log into [email or web address] and use [place]'s universal password to access. If you do not have the password, please contact the hospital operator. Locate the [facility] provider you are looking for under [facility] and page to a number that you can be directly reached. If you still have difficulty reaching the provider, please page the [facility] (Director on Call) for the Hospitalists listed on amion for assistance.   02/23/2021, 4:37 PM

## 2021-02-23 NOTE — ED Notes (Signed)
SpO2 95% on r/a in lobby.  Patient in [facility] unable to ambulate.

## 2021-02-23 NOTE — Plan of Care (Signed)
  Problem: Education: Goal: Knowledge of General Education information will improve Description Including pain rating scale, medication(s)/side effects and non-pharmacologic comfort measures Outcome: Progressing   

## 2021-02-23 NOTE — ED Notes (Signed)
Patient transported to CT 

## 2021-02-23 NOTE — Progress Notes (Signed)
ANTICOAGULATION CONSULT NOTE - Initial Consult  Pharmacy Consult for heparin Indication: pulmonary embolus  Allergies  Allergen Reactions   Cialis [Tadalafil]    Viagra [Sildenafil Citrate] Other (See Comments)    heartburn    Patient Measurements: Height: '5\' 7"'$  (170.2 cm) Weight: 105.5 kg (232 lb 8 oz) IBW/kg (Calculated) : 66.1 Heparin Dosing Weight: 89.5kg  Vital Signs: Temp: 98.3 F (36.8 C) (07/29 0953) BP: 109/78 (07/29 1205) Pulse Rate: 96 (07/29 1205)  Labs: Recent Labs    02/23/21 1015  HGB 15.3  HCT 41.9  PLT 243  CREATININE 1.14  TROPONINIHS 129*    Estimated Creatinine Clearance: 63.9 mL/min (by C-G formula based on SCr of 1.14 mg/dL).   Medical History: Past Medical History:  Diagnosis Date   Colon polyp    GERD (gastroesophageal reflux disease)    Hemorrhoid    Hyperplasia of prostate    Hypertension    Other and unspecified hyperlipidemia    Other testicular hypofunction    Pneumonia    Pulmonary embolism (HCC)    Sinus bradycardia     Medications:  Infusions:   heparin      Assessment: 57 yom presented to the ED with SOB. Found to have a large bilateral PE with right heart strain. To start IV heparin. Pt has a history of PE but is no longer on anticoagulation. CBC is WNL.   Goal of Therapy:  Heparin level 0.3-0.7 units/ml Monitor platelets by anticoagulation protocol: Yes   Plan:  Heparin bolus 5000 units IV x 1 Heparin gtt 1500 units/hr Check an 8 hr heparin level Daily heparin level and CBC  [name] [name], Rande Lawman 02/23/2021,12:11 PM

## 2021-02-23 NOTE — ED Notes (Signed)
Transported to [facility] via [facility] 

## 2021-02-23 NOTE — Progress Notes (Signed)
New admission to room 2W13 via [facility] received at this time. Patient is A&O*4. Able to verbalize his needs to staff. No CO pain or discomfort at this time. Oxygen at 2L and heparin running at 47m/hr. Cardiac monitor applied and CCMD notified of new patient. Skin intact with a cut to left elbow. VS checked. Bed kept in low position and locked. Call bell in reach.

## 2021-02-23 NOTE — ED Notes (Signed)
Portable Xray at bedside.

## 2021-02-23 NOTE — Consult Note (Signed)
NAME:  [name] [name], MRN:  [id number], DOB:  1945-08-26, LOS: 0 ADMISSION DATE:  02/23/2021, CONSULTATION DATE: 02/23/2021 REFERRING MD:  Dr. Lorin [name], CHIEF COMPLAINT: Bilateral submassive pulmonary embolism  History of Present Illness:  [name] [name] is a 75 year old male with a past medical history significant for prior DVT and PE not on chronic anticoagulation, COVID-pneumonia, hypertension, sinus bradycardia, GERD, and colon polyps who presented initially to his primary care physician's office for complaints of worsening weakness, presyncope, and intermittent hypoxia, shortness of breath. Patient's primary provider states he was mildly hypotensive with profound weakness and shortness of breath at office and was to present to the emergency department by EMS but patient declined and instead his daughter transported him to ED. Patient states he had been feeling weak with progressive dyspnea for approximately 2 weeks,    On ED arrival patient was seen patient was seen with stable vital signs and no documented hypoxia.  Stated work-up included CT PE and revealed a large bilateral pulmonary emboli with evidence of right heart strain (RV/LV ratio of 2.0).  Echocardiogram revealed EF of 55-60% with grade 1 diastolic dysfunction and severely reduced right ventricular systolic function  PCCM consulted to assist in management of acute submassive PE  Pertinent  Medical History  History of prior bilateral DVT and PE no longer on anticoagulation COVID-pneumonia Hypertension Sinus bradycardia GERD Colon polyps  Significant Hospital Events:  7/29 for presyncope, weakness, and shortness of breath, found to have submassive pulmonary embolism  Interim History / Subjective:  Reports exertional dyspnea   Objective   Blood pressure 98/69, pulse 94, temperature 98.7 F (37.1 C), temperature source Oral, resp. rate 20, height 5' 7.01" (1.702 m), weight 104.7 kg, SpO2 98 %.        Intake/Output Summary  (Last 24 hours) at 02/23/2021 1621 Last data filed at 02/23/2021 1554 Gross per 24 hour  Intake 3 ml  Output --  Net 3 ml   Filed Weights   02/23/21 0955 02/23/21 1258 02/23/21 1516  Weight: 105.5 kg 104.3 kg 104.7 kg    Examination: General: Acute on chronically ill appearing elderly male lying in bed, in NAD HEENT: [place]/AT, MM pink/moist, PERRL,  Neuro: Alert and oriented x3, non-focal CV: s1s2 regular rate and rhythm, no murmur, rubs, or gallops,  PULM:  Clear to ascultation, no increased work of breathing  GI: soft, bowel sounds active in all 4 quadrants, non-tender, non-distended Extremities: warm/dry, no edema  Skin: no rashes or lesions  Resolved Hospital Problem list     Assessment & Plan:  Bilateral provoked  submassive PE with evidence of right heart strain on CT and echocardiogram -With presyncope, weakness, and shortness of breath given history of prior DVT and PE patient was sent to the emergency department from PCP office for further evaluation.  The PE on admission revealed submassive PE with RV/LV ratio of 2.0.  Echocardiogram revealed severely reduced RV function -Patient reports decreased mobility since approximately April due to back pain and knee pain. -PESI score 116 P: Continue systemic heparin Head CT on admission negative  Obtain lower extremity Dopplers Echo as above Given submassive PE with evidence of right heart strain and presyncope patient is at risk for decompensated. Discussion held at bedside with patient and daughter regarding options for PE management. All risk and benefits were discussed and patient has decided to continue with current regiment and hold on any aggressive measures at this time.  If patient were to worsen or decompensate tPA should be  considered    Best Practice    Diet/type: Regular consistency (see orders) DVT prophylaxis: systemic heparin GI prophylaxis: PPI Lines: N/A Foley:  N/A Code Status:  full code Last date of  multidisciplinary goals of care discussion Pending   Labs   CBC: Recent Labs  Lab 02/23/21 1015  WBC 10.3  NEUTROABS 7.8*  HGB 15.3  HCT 41.9  MCV 98.1  PLT 0000000    Basic Metabolic Panel: Recent Labs  Lab 02/23/21 1015  NA 138  K 3.1*  CL 93*  CO2 32  GLUCOSE 166*  BUN 16  CREATININE 1.14  CALCIUM 9.1   GFR: Estimated Creatinine Clearance: 63.5 mL/min (by C-G formula based on SCr of 1.14 mg/dL). Recent Labs  Lab 02/23/21 1015  WBC 10.3    Liver Function Tests: Recent Labs  Lab 02/23/21 1015  AST 44*  ALT 24  ALKPHOS 55  BILITOT 1.2  PROT 6.1*  ALBUMIN 3.1*   Recent Labs  Lab 02/23/21 1015  LIPASE 25   No results for input(s): AMMONIA in the last 168 hours.  ABG No results found for: PHART, PCO2ART, PO2ART, HCO3, TCO2, ACIDBASEDEF, O2SAT   Coagulation Profile: No results for input(s): INR, PROTIME in the last 168 hours.  Cardiac Enzymes: No results for input(s): CKTOTAL, CKMB, CKMBINDEX, TROPONINI in the last 168 hours.  HbA1C: Hemoglobin A1C  Date/Time Value Ref Range Status  10/11/2013 08:55 AM 5.6%  Final    Comment:    normal range 4.2-6.3%    CBG: No results for input(s): GLUCAP in the last 168 hours.  Review of Systems:   Please see the history of present illness. All other systems reviewed and are negative   Past Medical History:  He,  has a past medical history of Colon polyp, GERD (gastroesophageal reflux disease), Hemorrhoid, Hyperplasia of prostate, Hypertension, Other and unspecified hyperlipidemia, Other testicular hypofunction, Pneumonia, Pulmonary embolism ([place]), and Sinus bradycardia.   Surgical History:   Past Surgical History:  Procedure Laterality Date   APPENDECTOMY     HAND SURGERY Right      Social History:   reports that he quit smoking about 7 years ago. His smoking use included cigars. His smokeless tobacco use includes chew. He reports current alcohol use. He reports that he does not use drugs.   Family  History:  His family history includes Diabetes in his daughter and father; Heart attack in his mother; Stroke in his mother.   Allergies Allergies  Allergen Reactions   Cialis [Tadalafil] Other (See Comments)    Not allergic 02/23/21   Viagra [Sildenafil Citrate] Other (See Comments)    heartburn     Home Medications  Prior to Admission medications   Medication Sig Start Date End Date Taking? Authorizing Provider  B Complex Vitamins (VITAMIN B COMPLEX PO) Take 1 tablet by mouth daily.   Yes [provider]  furosemide (LASIX) 20 MG tablet Take 20 mg by mouth daily as needed for edema.   Yes [provider]  omeprazole (PRILOSEC) 20 MG capsule Take 1 capsule (20 mg total) by mouth daily. 04/12/20  Yes Dettinger, Fransisca Kaufmann, MD  amLODipine (NORVASC) 5 MG tablet Take 1 tablet (5 mg total) by mouth daily. Patient not taking: Reported on 05/31/2020 04/12/20 05/31/20  Dettinger, Fransisca Kaufmann, MD     Critical care time: N/A   [name] Alatorre D. Kenton Kingfisher, NP-C  Pulmonary & Critical Care Personal contact information can be found on Amion  02/23/2021, 5:00 PM

## 2021-02-23 NOTE — ED Notes (Signed)
Report called to [facility],  [address] [place].

## 2021-02-24 ENCOUNTER — Inpatient Hospital Stay (HOSPITAL_COMMUNITY): Payer: Medicare HMO

## 2021-02-24 DIAGNOSIS — I2699 Other pulmonary embolism without acute cor pulmonale: Secondary | ICD-10-CM

## 2021-02-24 DIAGNOSIS — I10 Essential (primary) hypertension: Secondary | ICD-10-CM

## 2021-02-24 LAB — CBC
HCT: 39.5 % (ref 39.0–52.0)
Hemoglobin: 13.7 g/dL (ref 13.0–17.0)
MCH: 35.2 pg — ABNORMAL HIGH (ref 26.0–34.0)
MCHC: 34.7 g/dL (ref 30.0–36.0)
MCV: 101.5 fL — ABNORMAL HIGH (ref 80.0–100.0)
Platelets: 205 10*3/uL (ref 150–400)
RBC: 3.89 MIL/uL — ABNORMAL LOW (ref 4.22–5.81)
RDW: 13 % (ref 11.5–15.5)
WBC: 9.3 10*3/uL (ref 4.0–10.5)
nRBC: 0 % (ref 0.0–0.2)

## 2021-02-24 LAB — HEPARIN LEVEL (UNFRACTIONATED)
Heparin Unfractionated: 0.6 IU/mL (ref 0.30–0.70)
Heparin Unfractionated: 0.49 IU/mL (ref 0.30–0.70)

## 2021-02-24 LAB — BASIC METABOLIC PANEL
Anion gap: 8 (ref 5–15)
BUN: 13 mg/dL (ref 8–23)
CO2: 30 mmol/L (ref 22–32)
Calcium: 8.5 mg/dL — ABNORMAL LOW (ref 8.9–10.3)
Chloride: 100 mmol/L (ref 98–111)
Creatinine, Ser: 1.07 mg/dL (ref 0.61–1.24)
GFR, Estimated: >60 mL/min (ref >60)
Glucose, Bld: 128 mg/dL — ABNORMAL HIGH (ref 70–99)
Potassium: 3.9 mmol/L (ref 3.5–5.1)
Sodium: 138 mmol/L (ref 135–145)

## 2021-02-24 NOTE — Progress Notes (Addendum)
Patient ID: [name] [name], male   DOB: 10-Oct-1945, 75 y.o.   MRN: [id number]  PROGRESS NOTE    [name] [name]  [id number] DOB: 1945/08/23 DOA: 02/23/2021 PCP: Dettinger, Fransisca Kaufmann, MD   Brief Narrative:  75 y.o. male with medical history significant of HTN and DVT and PE thought to be provoked in the setting of COVID-19 infection in November 2021 and was taken off anticoagulation/Eliquis in April 2022 presented with worsening shortness of breath along with periodic leg swelling.  On presentation, he was found to have submassive PE with right heart strain.  He was started on heparin drip.  PCCM was consulted.  Assessment & Plan:   Recurrent submassive PE -Patient has a history of prior thromboembolic disease in the setting of COVID-19 infection and was taken off anticoagulation/Eliquis in April 2022 -Presented with worsening shortness of breath and leg swelling.  Found to have submassive PE with right heart strain -Echo showed dilated RV which is hypokinetic concerning for significant right heart strain. -PCCM was consulted and recommended to continue heparin drip and patient would be a poor candidate for thrombolysis because of recent functional decline and decreased mobility -Continue heparin drip for 1 more day.  Possible switch to Eliquis in 1 to 2 days pending clinical improvement.  He was on Eliquis in the past.  Will need lifelong anticoagulation -Lower extremity duplex is pending  Hypertension -Currently not on any antihypertensive as an outpatient.  Blood pressure on the lower side  Hypokalemia -Resolved  Obesity -Outpatient follow-up    DVT prophylaxis: Heparin drip Code Status: DNR Family Communication: None at bedside Disposition Plan: Status is: Inpatient  Remains inpatient appropriate because:Inpatient level of care appropriate due to severity of illness  Dispo: The patient is from: Home              Anticipated d/c is to: Home and 1 to 3 days pending  clinical improvement              Patient currently is not medically stable to d/c.   Difficult to place patient No  Consultants: PCCM  Procedures: Echo as below  Antimicrobials: None   Subjective: Patient seen and examined at bedside.  Denies worsening shortness of breath, chest pain, fever or vomiting.  Objective: Vitals:   02/23/21 2050 02/23/21 2300 02/24/21 0300 02/24/21 0759  BP: (!) 85/57 98/64  118/83  Pulse:  81  87  Resp: 20 (!) 0  18  Temp: 98.3 F (36.8 C)  97.8 F (36.6 C) 98.2 F (36.8 C)  TempSrc: Oral  Oral Oral  SpO2: 95% 92%  96%  Weight:      Height:        Intake/Output Summary (Last 24 hours) at 02/24/2021 1015 Last data filed at 02/24/2021 0737 Gross per 24 hour  Intake 328.67 ml  Output --  Net 328.67 ml   Filed Weights   02/23/21 0955 02/23/21 1258 02/23/21 1516  Weight: 105.5 kg 104.3 kg 104.7 kg    Examination:  General exam: Appears calm and comfortable.  Currently on 2 L oxygen via nasal cannula. Respiratory system: Bilateral decreased breath sounds at bases with scattered crackles Cardiovascular system: S1 & S2 heard, Rate controlled Gastrointestinal system: Abdomen is nondistended, soft and nontender. Normal bowel sounds heard. Extremities: No cyanosis, clubbing; right calf is bigger than left Central nervous system: Alert and oriented. No focal neurological deficits. Moving extremities Skin: No rashes, lesions or ulcers Psychiatry: Mostly flat affect.   Data Reviewed:  I have personally reviewed following labs and imaging studies  CBC: Recent Labs  Lab 02/23/21 1015 02/24/21 0600  WBC 10.3 9.3  NEUTROABS 7.8*  --   HGB 15.3 13.7  HCT 41.9 39.5  MCV 98.1 101.5*  PLT 243 [id number]   Basic Metabolic Panel: Recent Labs  Lab 02/23/21 1015 02/24/21 0600  NA 138 138  K 3.1* 3.9  CL 93* 100  CO2 32 30  GLUCOSE 166* 128*  BUN 16 13  CREATININE 1.14 1.07  CALCIUM 9.1 8.5*   GFR: Estimated Creatinine Clearance: 67.7 mL/min  (by C-G formula based on SCr of 1.07 mg/dL). Liver Function Tests: Recent Labs  Lab 02/23/21 1015  AST 44*  ALT 24  ALKPHOS 55  BILITOT 1.2  PROT 6.1*  ALBUMIN 3.1*   Recent Labs  Lab 02/23/21 1015  LIPASE 25   No results for input(s): AMMONIA in the last 168 hours. Coagulation Profile: No results for input(s): INR, PROTIME in the last 168 hours. Cardiac Enzymes: No results for input(s): CKTOTAL, CKMB, CKMBINDEX, TROPONINI in the last 168 hours. BNP (last 3 results) No results for input(s): PROBNP in the last 8760 hours. HbA1C: No results for input(s): HGBA1C in the last 72 hours. CBG: No results for input(s): GLUCAP in the last 168 hours. Lipid Profile: No results for input(s): CHOL, HDL, LDLCALC, TRIG, CHOLHDL, LDLDIRECT in the last 72 hours. Thyroid Function Tests: No results for input(s): TSH, T4TOTAL, FREET4, T3FREE, THYROIDAB in the last 72 hours. Anemia Panel: No results for input(s): VITAMINB12, FOLATE, FERRITIN, TIBC, IRON, RETICCTPCT in the last 72 hours. Sepsis Labs: No results for input(s): PROCALCITON, LATICACIDVEN in the last 168 hours.  Recent Results (from the past 240 hour(s))  Resp Panel by RT-PCR (Flu A&B, Covid) Nasopharyngeal Swab     Status: None   Collection Time: 02/23/21 10:03 AM   Specimen: Nasopharyngeal Swab; Nasopharyngeal(NP) swabs in vial transport medium  Result Value Ref Range Status   SARS Coronavirus 2 by RT PCR NEGATIVE NEGATIVE Final    Comment: (NOTE) SARS-CoV-2 target nucleic acids are NOT DETECTED.  The SARS-CoV-2 RNA is generally detectable in upper respiratory specimens during the acute phase of infection. The lowest concentration of SARS-CoV-2 viral copies this assay can detect is 138 copies/mL. A negative result does not preclude SARS-Cov-2 infection and should not be used as the sole basis for treatment or other patient management decisions. A negative result may occur with  improper specimen collection/handling,  submission of specimen other than nasopharyngeal swab, presence of viral mutation(s) within the areas targeted by this assay, and inadequate number of viral copies(<138 copies/mL). A negative result must be combined with clinical observations, patient history, and epidemiological information. The expected result is Negative.  Fact Sheet for Patients:  EntrepreneurPulse.com.au  Fact Sheet for Healthcare Providers:  IncredibleEmployment.be  This test is no t yet approved or cleared by the [place] FDA and  has been authorized for detection and/or diagnosis of SARS-CoV-2 by FDA under an Emergency Use Authorization (EUA). This EUA will remain  in effect (meaning this test can be used) for the duration of the COVID-19 declaration under Section 564(b)(1) of the Act, 21 U.S.C.section 360bbb-3(b)(1), unless the authorization is terminated  or revoked sooner.       Influenza A by PCR NEGATIVE NEGATIVE Final   Influenza B by PCR NEGATIVE NEGATIVE Final    Comment: (NOTE) The Xpert Xpress SARS-CoV-2/FLU/RSV plus assay is intended as an aid in the diagnosis of influenza from Nasopharyngeal swab  specimens and should not be used as a sole basis for treatment. Nasal washings and aspirates are unacceptable for Xpert Xpress SARS-CoV-2/FLU/RSV testing.  Fact Sheet for Patients: EntrepreneurPulse.com.au  Fact Sheet for Healthcare Providers: IncredibleEmployment.be  This test is not yet approved or cleared by the [place] FDA and has been authorized for detection and/or diagnosis of SARS-CoV-2 by FDA under an Emergency Use Authorization (EUA). This EUA will remain in effect (meaning this test can be used) for the duration of the COVID-19 declaration under Section 564(b)(1) of the Act, 21 U.S.C. section 360bbb-3(b)(1), unless the authorization is terminated or revoked.  Performed at [facility], [address] [place], [place] [address]          Radiology Studies: CT Head Wo Contrast  Result Date: 02/23/2021 CLINICAL DATA:  Weakness EXAM: CT HEAD WITHOUT CONTRAST TECHNIQUE: Contiguous axial images were obtained from the base of the skull through the vertex without intravenous contrast. COMPARISON:  Head CT 09/16/2020 FINDINGS: Brain: No evidence of acute infarction, hemorrhage, hydrocephalus, extra-axial collection or mass lesion/mass effect.The ventricles are normal in size.Scattered subcortical and periventricular white matter hypodensities, nonspecific but likely sequela of chronic small vessel ischemic disease.Mild cerebral atrophy Vascular: No hyperdense vessel or unexpected calcification. Skull: Normal. Negative for fracture or focal lesion. Sinuses/Orbits: Mucous retention cyst in the left sphenoid sinus. Minimal ethmoid air cell and right maxillary sinus mucosal thickening. Other: None IMPRESSION: No acute intracranial abnormality. Unchanged sequela of chronic small vessel ischemic disease and mild cerebral atrophy. Electronically Signed   By: Maurine Simmering   On: 02/23/2021 12:00   CT Angio Chest PE W and/or Wo Contrast  Result Date: 02/23/2021 CLINICAL DATA:  Shortness of breath. EXAM: CT ANGIOGRAPHY CHEST WITH CONTRAST TECHNIQUE: Multidetector CT imaging of the chest was performed using the standard protocol during bolus administration of intravenous contrast. Multiplanar CT image reconstructions and MIPs were obtained to evaluate the vascular anatomy. CONTRAST:  16m OMNIPAQUE IOHEXOL 350 MG/ML SOLN COMPARISON:  August 15, 2020. FINDINGS: Cardiovascular: Large bilateral pulmonary emboli are noted which extend into the upper and lower lobe branches. RV/LV ratio of 2.0 is noted suggesting right heart strain. No pericardial effusion is noted. Atherosclerosis of thoracic aorta is noted without aneurysm formation. Mediastinum/Nodes: No enlarged mediastinal, hilar, or axillary lymph  nodes. Thyroid gland, trachea, and esophagus demonstrate no significant findings. Lungs/Pleura: No pneumothorax or pleural effusion is noted. Minimal bibasilar subsegmental atelectasis is noted. Upper Abdomen: No acute abnormality. Musculoskeletal: No chest wall abnormality. No acute or significant osseous findings. Review of the MIP images confirms the above findings. IMPRESSION: Large bilateral pulmonary emboli are noted. Positive for acute PE with CT evidence of right heart strain (RV/LV Ratio = 2.0) consistent with at least submassive (intermediate risk) PE. The presence of right heart strain has been associated with an increased risk of morbidity and mortality. Please refer to the "PE Focused" order set in EPIC. Critical Value/emergent results were called by telephone at the time of interpretation on 02/23/2021 at 12:02 pm to provider ADAM CURATOLO , who verbally acknowledged these results. Aortic Atherosclerosis (ICD10-I70.0). Electronically Signed   By: JMarijo ConceptionM.D.   On: 02/23/2021 12:02   DG Chest Portable 1 View  Result Date: 02/23/2021 CLINICAL DATA:  Shortness of breath EXAM: PORTABLE CHEST 1 VIEW COMPARISON:  Chest x-ray dated May 31, 2020 FINDINGS: Cardiac and mediastinal contours are within normal limits for AP technique. Click Lungs are clear.  No large pleural effusion or pneumothorax.  IMPRESSION: Clear lungs. Electronically Signed   By: Yetta Glassman MD   On: 02/23/2021 10:39   ECHOCARDIOGRAM COMPLETE  Result Date: 02/23/2021    ECHOCARDIOGRAM REPORT   Patient Name:   SHUAN KUBICK Date of Exam: 02/23/2021 Medical Rec #:  [id number]       Height:       67.0 in Accession #:    [id number]      Weight:       230.0 lb Date of Birth:  Jun 28, 1946      BSA:          2.146 m Patient Age:    24 years        BP:           98/69 mmHg Patient Gender: M               HR:           88 bpm. Exam Location:  Inpatient Procedure: 2D Echo STAT ECHO Indications:    pulmonary embolus  History:         Patient has prior history of Echocardiogram examinations, most                 recent 05/31/2020. Risk Factors:Hypertension and Dyslipidemia.  Sonographer:    Johny Chess Referring Phys: [place]  1. Left ventricular ejection fraction, by estimation, is 55 to 60%. The left ventricle has normal function. The left ventricle has no regional wall motion abnormalities. There is mild concentric left ventricular hypertrophy. Left ventricular diastolic parameters are consistent with Grade I diastolic dysfunction (impaired relaxation).  2. McConnell's sign present with akineiss of the basal to mid right ventricular free wall with sparing of the apex. Findings consistent with acute pulmonary embolism. Right ventricular systolic function is severely reduced. The right ventricular size is  severely enlarged. There is moderately elevated pulmonary artery systolic pressure.  3. The mitral valve is normal in structure. Trivial mitral valve regurgitation. No evidence of mitral stenosis.  4. The aortic valve is normal in structure. There is moderate calcification of the aortic valve. There is moderate thickening of the aortic valve. Aortic valve regurgitation is not visualized. No aortic stenosis is present.  5. The inferior vena cava is normal in size with <50% respiratory variability, suggesting right atrial pressure of 8 mmHg. FINDINGS  Left Ventricle: Left ventricular ejection fraction, by estimation, is 55 to 60%. The left ventricle has normal function. The left ventricle has no regional wall motion abnormalities. The left ventricular internal cavity size was normal in size. There is  mild concentric left ventricular hypertrophy. Left ventricular diastolic parameters are consistent with Grade I diastolic dysfunction (impaired relaxation). Indeterminate filling pressures. Right Ventricle: McConnell's sign present with akineiss of the basal to mid right ventricular free wall with sparing of the  apex. Findings consistent with acute pulmonary embolism. The right ventricular size is severely enlarged. No increase in right ventricular wall thickness. Right ventricular systolic function is severely reduced. There is moderately elevated pulmonary artery systolic pressure. The tricuspid regurgitant velocity is 3.22 m/s, and with an assumed right atrial pressure of 8 mmHg, the  estimated right ventricular systolic pressure is [id number] mmHg. Left Atrium: Left atrial size was normal in size. Right Atrium: Right atrial size was normal in size. Pericardium: There is no evidence of pericardial effusion. Mitral Valve: The mitral valve is normal in structure. Trivial mitral valve regurgitation. No evidence of mitral valve stenosis. Tricuspid Valve: The tricuspid valve is  normal in structure. Tricuspid valve regurgitation is mild . No evidence of tricuspid stenosis. Aortic Valve: The aortic valve is normal in structure. There is moderate calcification of the aortic valve. There is moderate thickening of the aortic valve. Aortic valve regurgitation is not visualized. No aortic stenosis is present. Pulmonic Valve: The pulmonic valve was normal in structure. Pulmonic valve regurgitation is not visualized. No evidence of pulmonic stenosis. Aorta: The aortic root is normal in size and structure. Venous: The inferior vena cava is normal in size with less than 50% respiratory variability, suggesting right atrial pressure of 8 mmHg. IAS/Shunts: No atrial level shunt detected by color flow Doppler.  LEFT VENTRICLE PLAX 2D LVIDd:         3.40 cm  Diastology LVIDs:         2.60 cm  LV e' medial:    4.35 cm/s LV PW:         1.05 cm  LV E/e' medial:  10.0 LV IVS:        1.20 cm  LV e' lateral:   5.98 cm/s LVOT diam:     1.60 cm  LV E/e' lateral: 7.3 LV SV:         22 LV SV Index:   10 LVOT Area:     2.01 cm  RIGHT VENTRICLE            IVC RV S prime:     7.94 cm/s  IVC diam: 1.80 cm TAPSE (M-mode): 1.0 cm LEFT ATRIUM           Index        RIGHT ATRIUM           Index LA diam:      2.90 cm 1.35 cm/m  RA Area:     13.80 cm LA Vol (A4C): 24.6 ml 11.46 ml/m RA Volume:   31.10 ml  14.49 ml/m  AORTIC VALVE LVOT Vmax:   63.00 cm/s LVOT Vmean:  43.300 cm/s LVOT VTI:    0.108 m  AORTA Ao Root diam: 3.50 cm Ao Asc diam:  3.30 cm MITRAL VALVE               TRICUSPID VALVE MV Area (PHT): 4.17 cm    TR Peak grad:   41.5 mmHg MV Decel Time: 182 msec    TR Vmax:        322.00 cm/s MV E velocity: 43.70 cm/s MV A velocity: 66.80 cm/s  SHUNTS MV E/A ratio:  0.65        Systemic VTI:  0.11 m                            Systemic Diam: 1.60 cm Skeet Latch MD Electronically signed by Skeet Latch MD Signature Date/Time: 02/23/2021/3:56:33 PM    Final         Scheduled Meds:  docusate sodium  100 mg Oral BID   pantoprazole  40 mg Oral Daily   sodium chloride flush  3 mL Intravenous Q12H   Continuous Infusions:  heparin 1,350 Units/hr (02/24/21 0737)          Aline August, MD [facility] 02/24/2021, 10:15 AM

## 2021-02-24 NOTE — Progress Notes (Signed)
ANTICOAGULATION CONSULT NOTE   Pharmacy Consult for heparin Indication: pulmonary embolus  Allergies  Allergen Reactions   Cialis [Tadalafil] Other (See Comments)    Not allergic 02/23/21   Viagra [Sildenafil Citrate] Other (See Comments)    heartburn    Patient Measurements: Height: 5' 7.01" (170.2 cm) Weight: 104.7 kg (230 lb 13.2 oz) IBW/kg (Calculated) : 66.12 Heparin Dosing Weight: 89.5kg  Vital Signs: Temp: 97.8 F (36.6 C) (07/30 0300) Temp Source: Oral (07/30 0300) BP: 98/64 (07/29 2300) Pulse Rate: 81 (07/29 2300)  Labs: Recent Labs    02/23/21 1015 02/23/21 1217 02/23/21 2013 02/24/21 0600  HGB 15.3  --   --  13.7  HCT 41.9  --   --  39.5  PLT 243  --   --  205  HEPARINUNFRC  --   --  0.93* 0.60  CREATININE 1.14  --   --  1.07  TROPONINIHS 129* 132*  --   --      Estimated Creatinine Clearance: 67.7 mL/min (by C-G formula based on SCr of 1.07 mg/dL).   Medical History: Past Medical History:  Diagnosis Date   Colon polyp    GERD (gastroesophageal reflux disease)    Hemorrhoid    Hyperplasia of prostate    Hypertension    Other and unspecified hyperlipidemia    Other testicular hypofunction    Pneumonia    Pulmonary embolism (HCC)    Sinus bradycardia     Medications:  Infusions:   heparin 1,350 Units/hr (02/24/21 0205)    Assessment: 2 yom presented to the ED with SOB. Found to have a large bilateral PE with right heart strain. To start IV heparin. Pt has a history of PE but is no longer on anticoagulation. CBC is WNL.   7/30 AM update:  Heparin level therapeutic after rate decrease  Goal of Therapy:  Heparin level 0.3-0.7 units/ml Monitor platelets by anticoagulation protocol: Yes   Plan:  Cont heparin 1350 units/hr 8 hour heparin level  Narda Bonds, PharmD, BCPS Clinical Pharmacist Phone: [phone]

## 2021-02-24 NOTE — Progress Notes (Signed)
VASCULAR LAB    Bilateral lower extremity venous duplex has been performed.  See CV proc for preliminary results.  Messaged results to Dr. Remi Haggard via secure chat.   Madelena Maturin, RVT 02/24/2021, 1:41 PM

## 2021-02-24 NOTE — Progress Notes (Signed)
ANTICOAGULATION CONSULT NOTE   Pharmacy Consult for heparin Indication: pulmonary embolus  Allergies  Allergen Reactions   Cialis [Tadalafil] Other (See Comments)    Not allergic 02/23/21   Viagra [Sildenafil Citrate] Other (See Comments)    heartburn    Patient Measurements: Height: 5' 7.01" (170.2 cm) Weight: 104.7 kg (230 lb 13.2 oz) IBW/kg (Calculated) : 66.12 Heparin Dosing Weight: 89.5kg  Vital Signs: Temp: 98.1 F (36.7 C) (07/30 1538) Temp Source: Oral (07/30 1538) BP: 101/63 (07/30 1538) Pulse Rate: 90 (07/30 1538)  Labs: Recent Labs    02/23/21 1015 02/23/21 1217 02/23/21 2013 02/24/21 0600 02/24/21 1451  HGB 15.3  --   --  13.7  --   HCT 41.9  --   --  39.5  --   PLT 243  --   --  205  --   HEPARINUNFRC  --   --  0.93* 0.60 0.49  CREATININE 1.14  --   --  1.07  --   TROPONINIHS 129* 132*  --   --   --      Estimated Creatinine Clearance: 67.7 mL/min (by C-G formula based on SCr of 1.07 mg/dL).   Medical History: Past Medical History:  Diagnosis Date   Colon polyp    GERD (gastroesophageal reflux disease)    Hemorrhoid    Hyperplasia of prostate    Hypertension    Other and unspecified hyperlipidemia    Other testicular hypofunction    Pneumonia    Pulmonary embolism (HCC)    Sinus bradycardia     Medications:  Infusions:   heparin 1,350 Units/hr (02/24/21 0737)    Assessment: 44 yom presented to the ED with SOB. Found to have a large bilateral PE with right heart strain. To start IV heparin. Pt has a history of PE but is no longer on anticoagulation. CBC is WNL.   Heparin level remains at goal this afternoon, no overt bleeding or complications noted.  Goal of Therapy:  Heparin level 0.3-0.7 units/ml Monitor platelets by anticoagulation protocol: Yes   Plan:  Cont heparin 1350 units/hr Daily heparin level and CBC.  [place] Crane, Roylene Reason, BCCP Clinical Pharmacist  02/24/2021 3:57 PM   [facility] pharmacy phone numbers are  listed on [email or web address]

## 2021-02-25 ENCOUNTER — Telehealth: Payer: Self-pay | Admitting: Internal Medicine

## 2021-02-25 DIAGNOSIS — I2699 Other pulmonary embolism without acute cor pulmonale: Secondary | ICD-10-CM | POA: Diagnosis not present

## 2021-02-25 DIAGNOSIS — I82421 Acute embolism and thrombosis of right iliac vein: Secondary | ICD-10-CM

## 2021-02-25 DIAGNOSIS — I2609 Other pulmonary embolism with acute cor pulmonale: Secondary | ICD-10-CM | POA: Diagnosis not present

## 2021-02-25 LAB — CBC
HCT: 38.4 % — ABNORMAL LOW (ref 39.0–52.0)
Hemoglobin: 13.2 g/dL (ref 13.0–17.0)
MCH: 35.1 pg — ABNORMAL HIGH (ref 26.0–34.0)
MCHC: 34.4 g/dL (ref 30.0–36.0)
MCV: 102.1 fL — ABNORMAL HIGH (ref 80.0–100.0)
Platelets: 185 10*3/uL (ref 150–400)
RBC: 3.76 MIL/uL — ABNORMAL LOW (ref 4.22–5.81)
RDW: 12.9 % (ref 11.5–15.5)
WBC: 7.4 10*3/uL (ref 4.0–10.5)
nRBC: 0 % (ref 0.0–0.2)

## 2021-02-25 LAB — BASIC METABOLIC PANEL
Anion gap: 7 (ref 5–15)
BUN: 17 mg/dL (ref 8–23)
CO2: 30 mmol/L (ref 22–32)
Calcium: 8.8 mg/dL — ABNORMAL LOW (ref 8.9–10.3)
Chloride: 100 mmol/L (ref 98–111)
Creatinine, Ser: 1.16 mg/dL (ref 0.61–1.24)
GFR, Estimated: >60 mL/min (ref >60)
Glucose, Bld: 128 mg/dL — ABNORMAL HIGH (ref 70–99)
Potassium: 3.6 mmol/L (ref 3.5–5.1)
Sodium: 137 mmol/L (ref 135–145)

## 2021-02-25 LAB — HEPARIN LEVEL (UNFRACTIONATED): Heparin Unfractionated: 0.44 IU/mL (ref 0.30–0.70)

## 2021-02-25 MED ORDER — APIXABAN 5 MG PO TABS: 5.0000 mg | ORAL_TABLET | Freq: Two times a day (BID) | ORAL | Status: DC

## 2021-02-25 MED ORDER — APIXABAN 5 MG PO TABS
10.0000 mg | ORAL_TABLET | Freq: Two times a day (BID) | ORAL | Status: DC
  Administered 2021-02-25: 10 mg via ORAL
  Filled 2021-02-25: qty 2

## 2021-02-25 MED ORDER — APIXABAN (ELIQUIS) VTE STARTER PACK (10MG AND 5MG): ORAL_TABLET | ORAL | 0 refills | Status: DC

## 2021-02-25 NOTE — Progress Notes (Signed)
NAME:  [name] [name], MRN:  [id number], DOB:  10-May-1946, LOS: 2 ADMISSION DATE:  02/23/2021, CONSULTATION DATE: 02/23/2021 REFERRING MD:  Dr. Lorin Mercy, CHIEF COMPLAINT: Bilateral submassive pulmonary embolism  History of Present Illness:  [name] [name] is a 75 year old male with a past medical history significant for prior DVT and PE not on chronic anticoagulation, COVID-pneumonia, hypertension, sinus bradycardia, GERD, and colon polyps who presented initially to his primary care physician's office for complaints of worsening weakness, presyncope, and intermittent hypoxia, shortness of breath. Patient's primary provider states he was mildly hypotensive with profound weakness and shortness of breath at office and was to present to the emergency department by EMS but patient declined and instead his daughter transported him to ED. Patient states he had been feeling weak with progressive dyspnea for approximately 2 weeks,    On ED arrival patient was seen patient was seen with stable vital signs and no documented hypoxia.  Stated work-up included CT PE and revealed a large bilateral pulmonary emboli with evidence of right heart strain (RV/LV ratio of 2.0).  Echocardiogram revealed EF of 55-60% with grade 1 diastolic dysfunction and severely reduced right ventricular systolic function  PCCM consulted to assist in management of acute submassive PE  Pertinent  Medical History  History of prior bilateral DVT and PE no longer on anticoagulation COVID-pneumonia Hypertension Sinus bradycardia GERD Colon polyps  Significant Hospital Events:  7/29 for presyncope, weakness, and shortness of breath, found to have submassive pulmonary embolism  Interim History / Subjective:  Feeling better. Sitting up in chair. Daughter at bedside.   Objective   Blood pressure 130/79, pulse 71, temperature 98.4 F (36.9 C), resp. rate 16, height 5' 7.01" (1.702 m), weight 104.7 kg, SpO2 97 %.        Intake/Output  Summary (Last 24 hours) at 02/25/2021 1334 Last data filed at 02/24/2021 2100 Gross per 24 hour  Intake 140.1 ml  Output 250 ml  Net -109.9 ml   Filed Weights   02/23/21 0955 02/23/21 1258 02/23/21 1516  Weight: 105.5 kg 104.3 kg 104.7 kg    Examination: General: sitting up in chair, no distress HEENT: mmm Neuro: Alert and oriented x3, non-focal CV: s1s2 regular rate and rhythm, no murmur, rubs, or gallops,  PULM:  room air, non labored breathing, no wheezes or crackles GI: soft, bowel sounds active in all 4 quadrants, non-tender, non-distended Extremities: trace RLE edema   Resolved Hospital Problem list     Assessment & Plan:   Acute bilateral submassive PE, with recurrence, unprovoked.  Lower Extremity DVT, acute P: Discussed risks and benefits of half dose tpa on admission, ultimately treated conservatively with heparin gtt. Hemodynamics improved, off oxygen, HR normalized.  Continue systemic heparin, can transition to eliquis at discharge Recommend life long anticoagulation as long as no adverse events Will have him follow up in clinic with Dr. Loanne Drilling at discharge, consider repeat echo in 3 months for normalization of RV function.   No objection to discharge from pulmonary perspective. Discussed with Dr. Starla Link.   Lenice Llamas, MD Pulmonary and [place]   CBC: Recent Labs  Lab 02/23/21 1015 02/24/21 0600 02/25/21 0109  WBC 10.3 9.3 7.4  NEUTROABS 7.8*  --   --   HGB 15.3 13.7 13.2  HCT 41.9 39.5 38.4*  MCV 98.1 101.5* 102.1*  PLT 243 205 [id number]    Basic Metabolic Panel: Recent Labs  Lab 02/23/21 1015 02/24/21 0600 02/25/21 0109  NA  138 138 137  K 3.1* 3.9 3.6  CL 93* 100 100  CO2 32 30 30  GLUCOSE 166* 128* 128*  BUN '16 13 17  '$ CREATININE 1.14 1.07 1.16  CALCIUM 9.1 8.5* 8.8*   GFR: Estimated Creatinine Clearance: 62.5 mL/min (by C-G formula based on SCr of 1.16 mg/dL). Recent Labs  Lab 02/23/21 1015  02/24/21 0600 02/25/21 0109  WBC 10.3 9.3 7.4    Liver Function Tests: Recent Labs  Lab 02/23/21 1015  AST 44*  ALT 24  ALKPHOS 55  BILITOT 1.2  PROT 6.1*  ALBUMIN 3.1*   Recent Labs  Lab 02/23/21 1015  LIPASE 25   No results for input(s): AMMONIA in the last 168 hours.  ABG No results found for: PHART, PCO2ART, PO2ART, HCO3, TCO2, ACIDBASEDEF, O2SAT   Coagulation Profile: No results for input(s): INR, PROTIME in the last 168 hours.  Cardiac Enzymes: No results for input(s): CKTOTAL, CKMB, CKMBINDEX, TROPONINI in the last 168 hours.  HbA1C: Hemoglobin A1C  Date/Time Value Ref Range Status  10/11/2013 08:55 AM 5.6%  Final    Comment:    normal range 4.2-6.3%    CBG: No results for input(s): GLUCAP in the last 168 hours.

## 2021-02-25 NOTE — Evaluation (Signed)
Physical Therapy Evaluation Patient Details Name: [name] [name] MRN: [id number] DOB: 02-11-1946 Today's Date: 02/25/2021   History of Present Illness  75 y.o. male Presenting to ED 02/23/21 with SOB, back and knee pain, LE edema and fall secondary to weakness. Admitted for treatment of recurrent submassive PE with R heart strain. Found to have R LE DVT  PMH: HTN and Hx of provoked DVT and PE in Nov 2020 2/2 COVID PMH: HTN and PE thought to be provoked by COVID infection. PE thought to be provoked by COVID infection.  Clinical Impression  PTA pt living with grandson in doublewide trailer with 4 steps to enter. Pt reports independence with ambulation without AD, bADLs, and iADLs. Pt's grandson drives to shopping and appointments. Pt is currently limited in safe mobility by decreased strength and endurance. Pt is currently supervision for bed mobility, and min guard for transfers and ambulation with RW. Pt will not have any additional PT or equipment needs at discharge, however PT will continue to follow acutely.     Follow Up Recommendations No PT follow up;Supervision for mobility/OOB    Equipment Recommendations  None recommended by PT (has necessary equipment)       Precautions / Restrictions Precautions Precautions: Fall Restrictions Weight Bearing Restrictions: No      Mobility  Bed Mobility Overal bed mobility: Needs Assistance Bed Mobility: Supine to Sit     Supine to sit: Supervision;HOB elevated     General bed mobility comments: supervision for safety, increased time and effort required    Transfers Overall transfer level: Needs assistance Equipment used: Rolling walker (2 wheeled) Transfers: Sit to/from Stand Sit to Stand: Min guard         General transfer comment: min guard for safety, vc for hand placement for power up, good power up and self steadying  Ambulation/Gait Ambulation/Gait assistance: Min guard Gait Distance (Feet): 60 Feet Assistive device:  Rolling walker (2 wheeled) Gait Pattern/deviations: Step-through pattern;Decreased step length - right;Decreased step length - left;Shuffle Gait velocity: decreased Gait velocity interpretation: <1.8 ft/sec, indicate of risk for recurrent falls General Gait Details: min guard for safety, vc for proximity to RW        Balance Overall balance assessment: Mild deficits observed, not formally tested                                           Pertinent Vitals/Pain Pain Assessment: No/denies pain    Home Living Family/patient expects to be discharged to:: Private residence Living Arrangements: Other relatives (grandson) Available Help at Discharge: Family;Available 24 hours/day Type of Home: Mobile home Home Access: Stairs to enter Entrance Stairs-Rails: Left Entrance Stairs-Number of Steps: 4 with left ascending rail Home Layout: One level Home Equipment: Tub bench;Walker - 4 wheels;Walker - 2 wheels;Cane - single point;Bedside commode;Hand held shower head      Prior Function Level of Independence: Independent         Comments: grandson helps with shopping occasionally     Hand Dominance        Extremity/Trunk Assessment   Upper Extremity Assessment Upper Extremity Assessment: Generalized weakness;Defer to OT evaluation    Lower Extremity Assessment Lower Extremity Assessment: Generalized weakness       Communication   Communication: HOH  Cognition Arousal/Alertness: Awake/alert Behavior During Therapy: WFL for tasks assessed/performed Overall Cognitive Status: Within Functional Limits for tasks assessed  General Comments General comments (skin integrity, edema, etc.): at rest SaO2 on RA 97%O2, HR 69bpm with ambulation SaO2 89%O2 with poor pleth waveform (rebounded to 94%O2) max HR noted 119bpm    Exercises General Exercises - Lower Extremity Ankle Circles/Pumps: AROM;20 reps;Seated    Assessment/Plan    PT Assessment Patient needs continued PT services  PT Problem List Decreased activity tolerance;Decreased mobility;Decreased safety awareness       PT Treatment Interventions DME instruction;Gait training;Stair training;Functional mobility training;Therapeutic activities;Therapeutic exercise;Balance training;Cognitive remediation;Patient/family education    PT Goals (Current goals can be found in the Care Plan section)  Acute Rehab PT Goals Patient Stated Goal: get back to mowing PT Goal Formulation: With patient/family Time For Goal Achievement: 03/11/21 Potential to Achieve Goals: Good    Frequency Min 3X/week    AM-PAC PT "6 Clicks" Mobility  Outcome Measure Help needed turning from your back to your side while in a flat bed without using bedrails?: None Help needed moving from lying on your back to sitting on the side of a flat bed without using bedrails?: None Help needed moving to and from a bed to a chair (including a wheelchair)?: A Little Help needed standing up from a chair using your arms (e.g., wheelchair or bedside chair)?: A Little Help needed to walk in hospital room?: A Little Help needed climbing 3-5 steps with a railing? : A Little 6 Click Score: 20    End of Session   Activity Tolerance: Patient tolerated treatment well Patient left: in chair;with call bell/phone within reach;with chair alarm set;with family/visitor present Nurse Communication: Mobility status PT Visit Diagnosis: Muscle weakness (generalized) (M62.81)    Time: [id number] PT Time Calculation (min) (ACUTE ONLY): 41 min   Charges:   PT Evaluation $PT Eval Moderate Complexity: 1 Mod PT Treatments $Gait Training: 8-22 mins $Therapeutic Exercise: 8-22 mins        Stacie Templin B. Migdalia Dk PT, DPT Acute Rehabilitation Services Pager [phone] Office [phone]   [place] 02/25/2021, 10:02 AM

## 2021-02-25 NOTE — Progress Notes (Addendum)
ANTICOAGULATION CONSULT NOTE - Initial Consult  Pharmacy Consult for apixaban Indication: pulmonary embolus and DVT  Allergies  Allergen Reactions   Cialis [Tadalafil] Other (See Comments)    Not allergic 02/23/21   Viagra [Sildenafil Citrate] Other (See Comments)    heartburn    Patient Measurements: Height: 5' 7.01" (170.2 cm) Weight: 104.7 kg (230 lb 13.2 oz) IBW/kg (Calculated) : 66.12 Heparin Dosing Weight: 89.5 kg  Vital Signs: Temp: 98.4 F (36.9 C) (07/31 0531) BP: 130/79 (07/31 0531) Pulse Rate: 71 (07/31 0531)  Labs: Recent Labs    02/23/21 1015 02/23/21 1217 02/23/21 2013 02/24/21 0600 02/24/21 1451 02/25/21 0109  HGB 15.3  --   --  13.7  --  13.2  HCT 41.9  --   --  39.5  --  38.4*  PLT 243  --   --  205  --  185  HEPARINUNFRC  --   --    < > 0.60 0.49 0.44  CREATININE 1.14  --   --  1.07  --  1.16  TROPONINIHS 129* 132*  --   --   --   --    < > = values in this interval not displayed.    Estimated Creatinine Clearance: 62.5 mL/min (by C-G formula based on SCr of 1.16 mg/dL).   Medical History: Past Medical History:  Diagnosis Date   Colon polyp    GERD (gastroesophageal reflux disease)    Hemorrhoid    Hyperplasia of prostate    Hypertension    Other and unspecified hyperlipidemia    Other testicular hypofunction    Pneumonia    Pulmonary embolism (HCC)    Sinus bradycardia     Medications:  Medications Prior to Admission  Medication Sig Dispense Refill Last Dose   B Complex Vitamins (VITAMIN B COMPLEX PO) Take 1 tablet by mouth daily.   02/22/2021   furosemide (LASIX) 20 MG tablet Take 20 mg by mouth daily as needed for edema.   02/22/2021   omeprazole (PRILOSEC) 20 MG capsule Take 1 capsule (20 mg total) by mouth daily. 90 capsule 3 02/22/2021    Assessment: 70 yom presented to the ED with SOB. Found to have a large bilateral PE with right heart strain, acute right lower extremity DVT and indeterminate left lower extremity  DVT -Patient has a history of prior thromboembolic disease in the setting of COVID-19 infection and was taken off anticoagulation/Eliquis in April 2022  Pharmacy consult to transition heparin to apixaban.  Goal of Therapy:  Anticoagulation Monitor platelets by anticoagulation protocol: Yes   Plan:  Stop heparin Start apixaban 10 mg PO BID x 7 days, then 5 mg PO BID thereafter. Monitor renal function and for signs and symptoms of bleeding.   Thank you for allowing [place] to participate in this patients care. Jens Som, PharmD 02/25/2021 1:25 PM  Please check [email or web address] for unit-specific pharmacy phone numbers.

## 2021-02-25 NOTE — Telephone Encounter (Signed)
Needs follow up with Dr. Loanne Drilling in 2-4 weeks for hospital follow up.

## 2021-02-25 NOTE — Discharge Instructions (Signed)
Information on my medicine - ELIQUIS (apixaban)  This medication education was reviewed with me or my healthcare representative as part of my discharge preparation.    Why was Eliquis prescribed for you? Eliquis was prescribed to treat blood clots that may have been found in the veins of your legs (deep vein thrombosis) or in your lungs (pulmonary embolism) and to reduce the risk of them occurring again.  What do You need to know about Eliquis ? The starting dose is 10 mg (two 5 mg tablets) taken TWICE daily for the FIRST SEVEN (7) DAYS, then on (enter date)  ***  the dose is reduced to ONE 5 mg tablet taken TWICE daily.  Eliquis may be taken with or without food.   Try to take the dose about the same time in the morning and in the evening. If you have difficulty swallowing the tablet whole please discuss with your pharmacist how to take the medication safely.  Take Eliquis exactly as prescribed and DO NOT stop taking Eliquis without talking to the doctor who prescribed the medication.  Stopping may increase your risk of developing a new blood clot.  Refill your prescription before you run out.  After discharge, you should have regular check-up appointments with your healthcare provider that is prescribing your Eliquis.    What do you do if you miss a dose? If a dose of ELIQUIS is not taken at the scheduled time, take it as soon as possible on the same day and twice-daily administration should be resumed. The dose should not be doubled to make up for a missed dose.  Important Safety Information A possible side effect of Eliquis is bleeding. You should call your healthcare provider right away if you experience any of the following: Bleeding from an injury or your nose that does not stop. Unusual colored urine (red or dark brown) or unusual colored stools (red or black). Unusual bruising for unknown reasons. A serious fall or if you hit your head (even if there is no  bleeding).  Some medicines may interact with Eliquis and might increase your risk of bleeding or clotting while on Eliquis. To help avoid this, consult your healthcare provider or pharmacist prior to using any new prescription or non-prescription medications, including herbals, vitamins, non-steroidal anti-inflammatory drugs (NSAIDs) and supplements.  This website has more information on Eliquis (apixaban): http://www.eliquis.com/eliquis/home

## 2021-02-25 NOTE — Plan of Care (Signed)
  Problem: Education: Goal: Knowledge of General Education information will improve Description: Including pain rating scale, medication(s)/side effects and non-pharmacologic comfort measures Outcome: Progressing   Problem: Clinical Measurements: Goal: Ability to maintain clinical measurements within normal limits will improve Outcome: Progressing Goal: Will remain free from infection Outcome: Progressing Goal: Respiratory complications will improve Outcome: Progressing Goal: Cardiovascular complication will be avoided Outcome: Progressing   Problem: Nutrition: Goal: Adequate nutrition will be maintained Outcome: Progressing   Problem: Coping: Goal: Level of anxiety will decrease Outcome: Progressing   Problem: Elimination: Goal: Will not experience complications related to urinary retention Outcome: Progressing   Problem: Safety: Goal: Ability to remain free from injury will improve Outcome: Progressing   Problem: Activity: Goal: Risk for activity intolerance will decrease Outcome: Not Progressing

## 2021-02-25 NOTE — Progress Notes (Addendum)
ANTICOAGULATION CONSULT NOTE   Pharmacy Consult for heparin Indication: pulmonary embolus  Allergies  Allergen Reactions   Cialis [Tadalafil] Other (See Comments)    Not allergic 02/23/21   Viagra [Sildenafil Citrate] Other (See Comments)    heartburn    Patient Measurements: Height: 5' 7.01" (170.2 cm) Weight: 104.7 kg (230 lb 13.2 oz) IBW/kg (Calculated) : 66.12 Heparin Dosing Weight: 89.5kg  Vital Signs: Temp: 98.4 F (36.9 C) (07/31 0531) Temp Source: Oral (07/31 0052) BP: 130/79 (07/31 0531) Pulse Rate: 71 (07/31 0531)  Labs: Recent Labs    02/23/21 1015 02/23/21 1217 02/23/21 2013 02/24/21 0600 02/24/21 1451 02/25/21 0109  HGB 15.3  --   --  13.7  --  13.2  HCT 41.9  --   --  39.5  --  38.4*  PLT 243  --   --  205  --  185  HEPARINUNFRC  --   --    < > 0.60 0.49 0.44  CREATININE 1.14  --   --  1.07  --  1.16  TROPONINIHS 129* 132*  --   --   --   --    < > = values in this interval not displayed.     Estimated Creatinine Clearance: 62.5 mL/min (by C-G formula based on SCr of 1.16 mg/dL).   Medical History: Past Medical History:  Diagnosis Date   Colon polyp    GERD (gastroesophageal reflux disease)    Hemorrhoid    Hyperplasia of prostate    Hypertension    Other and unspecified hyperlipidemia    Other testicular hypofunction    Pneumonia    Pulmonary embolism (HCC)    Sinus bradycardia     Medications:  Infusions:   heparin 1,350 Units/hr (02/24/21 0737)    Assessment: 32 yom presented to the ED with SOB. Found to have a large bilateral PE with right heart strain. To start IV heparin. Pt has a history of PE and stopped taking Eliquis April 2022. CBC is WNL.   Heparin level remains at goal this morning HL=0.44), no bleeding or complications noted.  Goal of Therapy:  Heparin level 0.3-0.7 units/ml Monitor platelets by anticoagulation protocol: Yes   Plan:  Cont heparin 1350 units/hr Daily heparin level and CBC.  Pauletta Browns,  Pharm.D. PGY-1 Ambulatory Care Resident [id number] 02/25/2021 7:19 AM

## 2021-02-25 NOTE — Discharge Summary (Signed)
Physician Discharge Summary  [name] [name] [id number] DOB: Dec 18, 1945 DOA: 02/23/2021  PCP: Worthy Rancher, MD  Admit date: 02/23/2021 Discharge date: 02/25/2021  Admitted From: Home Disposition: Home  Recommendations for Outpatient Follow-up:  Follow up with PCP in 1 week with repeat CBC/BMP Follow up in ED if symptoms worsen or new appear   Home Health: No Equipment/Devices: None  Discharge Condition: Stable CODE STATUS: Full Diet recommendation: Heart healthy  Brief/Interim Summary: 75 y.o. male with medical history significant of HTN and DVT and PE thought to be provoked in the setting of COVID-19 infection in November 2021 and was taken off anticoagulation/Eliquis in April 2022 presented with worsening shortness of breath along with periodic leg swelling.  On presentation, he was found to have submassive PE with right heart strain.  He was started on heparin drip.  PCCM was consulted.  He was managed conservatively with heparin drip.  He was found to have acute right lower extremity DVT and indeterminate left lower extremity DVT.  Vascular surgery recommended conservative treatment as well.  He has been switched to oral Eliquis.  He will be discharged home on oral Eliquis today.  Outpatient follow-up with PCP.  Discharge Diagnoses:   Recurrent submassive PE Acute right lower extremity DVT and indeterminate left lower extremity DVT -Patient has a history of prior thromboembolic disease in the setting of COVID-19 infection and was taken off anticoagulation/Eliquis in April 2022 -Presented with worsening shortness of breath and leg swelling.  Found to have submassive PE with right heart strain -Echo showed dilated RV which is hypokinetic concerning for significant right heart strain. -PCCM was consulted and recommended to continue heparin drip and patient would be a poor candidate for thrombolysis because of recent functional decline and decreased mobility -Treated with  heparin drip.  I have communicated with Dr. Gilles Chiquito surgery who has reviewed the chart and recommended conservative treatment with no vascular surgery intervention.  He was on Eliquis in the past.  Will need lifelong anticoagulation -He has been switched to oral Eliquis.  He is currently on room air.  He will be discharged home on oral Eliquis today.  Outpatient follow-up with PCP.   Hypertension -Currently not on any antihypertensive as an outpatient.  Blood pressure on the lower side  Hypokalemia -Resolved  Obesity -Outpatient follow-up    Discharge Instructions  Discharge Instructions     Ambulatory referral to Pulmonology   Complete by: As directed    Followup for submassive PE   Reason for referral: Other   Diet - low sodium heart healthy   Complete by: As directed    Increase activity slowly   Complete by: As directed       Allergies as of 02/25/2021       Reactions   Cialis [tadalafil] Other (See Comments)   Not allergic 02/23/21   Viagra [sildenafil Citrate] Other (See Comments)   heartburn        Medication List     STOP taking these medications    furosemide 20 MG tablet Commonly known as: LASIX       TAKE these medications    Apixaban Starter Pack ('10mg'$  and '5mg'$ ) Commonly known as: ELIQUIS STARTER PACK Take as directed on package: start with two-'5mg'$  tablets twice daily for 7 days. On day 8, switch to one-'5mg'$  tablet twice daily.   omeprazole 20 MG capsule Commonly known as: PRILOSEC Take 1 capsule (20 mg total) by mouth daily.   VITAMIN B COMPLEX PO Take 1  tablet by mouth daily.        Follow-up Information     Dettinger, Fransisca Kaufmann, MD. Schedule an appointment as soon as possible for a visit in 1 week(s).   Specialties: Family Medicine, Cardiology Contact information: [place] [address] [phone]                Allergies  Allergen Reactions   Cialis [Tadalafil] Other (See Comments)    Not allergic  02/23/21   Viagra [Sildenafil Citrate] Other (See Comments)    heartburn    Consultations: PCCM.  Communicated with vascular surgery via secure chat   Procedures/Studies: CT Head Wo Contrast  Result Date: 02/23/2021 CLINICAL DATA:  Weakness EXAM: CT HEAD WITHOUT CONTRAST TECHNIQUE: Contiguous axial images were obtained from the base of the skull through the vertex without intravenous contrast. COMPARISON:  Head CT 09/16/2020 FINDINGS: Brain: No evidence of acute infarction, hemorrhage, hydrocephalus, extra-axial collection or mass lesion/mass effect.The ventricles are normal in size.Scattered subcortical and periventricular white matter hypodensities, nonspecific but likely sequela of chronic small vessel ischemic disease.Mild cerebral atrophy Vascular: No hyperdense vessel or unexpected calcification. Skull: Normal. Negative for fracture or focal lesion. Sinuses/Orbits: Mucous retention cyst in the left sphenoid sinus. Minimal ethmoid air cell and right maxillary sinus mucosal thickening. Other: None IMPRESSION: No acute intracranial abnormality. Unchanged sequela of chronic small vessel ischemic disease and mild cerebral atrophy. Electronically Signed   By: Maurine Simmering   On: 02/23/2021 12:00   CT Angio Chest PE W and/or Wo Contrast  Result Date: 02/23/2021 CLINICAL DATA:  Shortness of breath. EXAM: CT ANGIOGRAPHY CHEST WITH CONTRAST TECHNIQUE: Multidetector CT imaging of the chest was performed using the standard protocol during bolus administration of intravenous contrast. Multiplanar CT image reconstructions and MIPs were obtained to evaluate the vascular anatomy. CONTRAST:  171m OMNIPAQUE IOHEXOL 350 MG/ML SOLN COMPARISON:  August 15, 2020. FINDINGS: Cardiovascular: Large bilateral pulmonary emboli are noted which extend into the upper and lower lobe branches. RV/LV ratio of 2.0 is noted suggesting right heart strain. No pericardial effusion is noted. Atherosclerosis of thoracic aorta is noted  without aneurysm formation. Mediastinum/Nodes: No enlarged mediastinal, hilar, or axillary lymph nodes. Thyroid gland, trachea, and esophagus demonstrate no significant findings. Lungs/Pleura: No pneumothorax or pleural effusion is noted. Minimal bibasilar subsegmental atelectasis is noted. Upper Abdomen: No acute abnormality. Musculoskeletal: No chest wall abnormality. No acute or significant osseous findings. Review of the MIP images confirms the above findings. IMPRESSION: Large bilateral pulmonary emboli are noted. Positive for acute PE with CT evidence of right heart strain (RV/LV Ratio = 2.0) consistent with at least submassive (intermediate risk) PE. The presence of right heart strain has been associated with an increased risk of morbidity and mortality. Please refer to the "PE Focused" order set in EPIC. Critical Value/emergent results were called by telephone at the time of interpretation on 02/23/2021 at 12:02 pm to provider ADAM CURATOLO , who verbally acknowledged these results. Aortic Atherosclerosis (ICD10-I70.0). Electronically Signed   By: JMarijo ConceptionM.D.   On: 02/23/2021 12:02   DG Chest Portable 1 View  Result Date: 02/23/2021 CLINICAL DATA:  Shortness of breath EXAM: PORTABLE CHEST 1 VIEW COMPARISON:  Chest x-ray dated May 31, 2020 FINDINGS: Cardiac and mediastinal contours are within normal limits for AP technique. Click Lungs are clear.  No large pleural effusion or pneumothorax. IMPRESSION: Clear lungs. Electronically Signed   By: LYetta GlassmanMD   On: 02/23/2021 10:39   ECHOCARDIOGRAM  COMPLETE  Result Date: 02/23/2021    ECHOCARDIOGRAM REPORT   Patient Name:   [name] [name] Date of Exam: 02/23/2021 Medical Rec #:  [id number]       Height:       67.0 in Accession #:    [id number]      Weight:       230.0 lb Date of Birth:  1946/04/06      BSA:          2.146 m Patient Age:    14 years        BP:           98/69 mmHg Patient Gender: M               HR:           88 bpm. Exam  Location:  Inpatient Procedure: 2D Echo STAT ECHO Indications:    pulmonary embolus  History:        Patient has prior history of Echocardiogram examinations, most                 recent 05/31/2020. Risk Factors:Hypertension and Dyslipidemia.  Sonographer:    Johny Chess Referring Phys: Harris  1. Left ventricular ejection fraction, by estimation, is 55 to 60%. The left ventricle has normal function. The left ventricle has no regional wall motion abnormalities. There is mild concentric left ventricular hypertrophy. Left ventricular diastolic parameters are consistent with Grade I diastolic dysfunction (impaired relaxation).  2. McConnell's sign present with akineiss of the basal to mid right ventricular free wall with sparing of the apex. Findings consistent with acute pulmonary embolism. Right ventricular systolic function is severely reduced. The right ventricular size is  severely enlarged. There is moderately elevated pulmonary artery systolic pressure.  3. The mitral valve is normal in structure. Trivial mitral valve regurgitation. No evidence of mitral stenosis.  4. The aortic valve is normal in structure. There is moderate calcification of the aortic valve. There is moderate thickening of the aortic valve. Aortic valve regurgitation is not visualized. No aortic stenosis is present.  5. The inferior vena cava is normal in size with <50% respiratory variability, suggesting right atrial pressure of 8 mmHg. FINDINGS  Left Ventricle: Left ventricular ejection fraction, by estimation, is 55 to 60%. The left ventricle has normal function. The left ventricle has no regional wall motion abnormalities. The left ventricular internal cavity size was normal in size. There is  mild concentric left ventricular hypertrophy. Left ventricular diastolic parameters are consistent with Grade I diastolic dysfunction (impaired relaxation). Indeterminate filling pressures. Right Ventricle: McConnell's  sign present with akineiss of the basal to mid right ventricular free wall with sparing of the apex. Findings consistent with acute pulmonary embolism. The right ventricular size is severely enlarged. No increase in right ventricular wall thickness. Right ventricular systolic function is severely reduced. There is moderately elevated pulmonary artery systolic pressure. The tricuspid regurgitant velocity is 3.22 m/s, and with an assumed right atrial pressure of 8 mmHg, the  estimated right ventricular systolic pressure is [id number] mmHg. Left Atrium: Left atrial size was normal in size. Right Atrium: Right atrial size was normal in size. Pericardium: There is no evidence of pericardial effusion. Mitral Valve: The mitral valve is normal in structure. Trivial mitral valve regurgitation. No evidence of mitral valve stenosis. Tricuspid Valve: The tricuspid valve is normal in structure. Tricuspid valve regurgitation is mild . No evidence of tricuspid stenosis. Aortic Valve: The aortic valve is  normal in structure. There is moderate calcification of the aortic valve. There is moderate thickening of the aortic valve. Aortic valve regurgitation is not visualized. No aortic stenosis is present. Pulmonic Valve: The pulmonic valve was normal in structure. Pulmonic valve regurgitation is not visualized. No evidence of pulmonic stenosis. Aorta: The aortic root is normal in size and structure. Venous: The inferior vena cava is normal in size with less than 50% respiratory variability, suggesting right atrial pressure of 8 mmHg. IAS/Shunts: No atrial level shunt detected by color flow Doppler.  LEFT VENTRICLE PLAX 2D LVIDd:         3.40 cm  Diastology LVIDs:         2.60 cm  LV e' medial:    4.35 cm/s LV PW:         1.05 cm  LV E/e' medial:  10.0 LV IVS:        1.20 cm  LV e' lateral:   5.98 cm/s LVOT diam:     1.60 cm  LV E/e' lateral: 7.3 LV SV:         22 LV SV Index:   10 LVOT Area:     2.01 cm  RIGHT VENTRICLE            IVC RV S  prime:     7.94 cm/s  IVC diam: 1.80 cm TAPSE (M-mode): 1.0 cm LEFT ATRIUM           Index       RIGHT ATRIUM           Index LA diam:      2.90 cm 1.35 cm/m  RA Area:     13.80 cm LA Vol (A4C): 24.6 ml 11.46 ml/m RA Volume:   31.10 ml  14.49 ml/m  AORTIC VALVE LVOT Vmax:   63.00 cm/s LVOT Vmean:  43.300 cm/s LVOT VTI:    0.108 m  AORTA Ao Root diam: 3.50 cm Ao Asc diam:  3.30 cm MITRAL VALVE               TRICUSPID VALVE MV Area (PHT): 4.17 cm    TR Peak grad:   41.5 mmHg MV Decel Time: 182 msec    TR Vmax:        322.00 cm/s MV E velocity: 43.70 cm/s MV A velocity: 66.80 cm/s  SHUNTS MV E/A ratio:  0.65        Systemic VTI:  0.11 m                            Systemic Diam: 1.60 cm Skeet Latch MD Electronically signed by Skeet Latch MD Signature Date/Time: 02/23/2021/3:56:33 PM    Final    VAS [place] LOWER EXTREMITY VENOUS (DVT)  Result Date: 02/24/2021  Lower Venous DVT Study Patient Name:  [name] [name]  Date of Exam:   02/24/2021 Medical Rec #: [id number]        Accession #:    [id number] Date of Birth: [birth year]       Patient Gender: M Patient Age:   74Y Exam Location:  [facility] Procedure:      VAS [place] LOWER EXTREMITY VENOUS (DVT) Referring Phys: [id number] Lind --------------------------------------------------------------------------------  Indications: Pulmonary embolism.  Risk Factors: DVT Pulmonary embolism and DVT found 06/01/2020 History of Covid 05/2020. Anticoagulation: Stopped Eliquis 10/2020. Comparison Study: Prior study from 05/2020 is available for comparison Performing Technologist: Sharion Dove RVS  Examination  Guidelines: A complete evaluation includes B-mode imaging, spectral Doppler, color Doppler, and power Doppler as needed of all accessible portions of each vessel. Bilateral testing is considered an integral part of a complete examination. Limited examinations for reoccurring indications may be performed as noted. The reflux portion of the exam is performed  with the patient in reverse Trendelenburg.  +---------+---------------+---------+-----------+----------+-------------------+ RIGHT    CompressibilityPhasicitySpontaneityPropertiesThrombus Aging      +---------+---------------+---------+-----------+----------+-------------------+ CFV      Partial                                      Acute thrombus,                                                           pulsatile waveform  +---------+---------------+---------+-----------+----------+-------------------+ SFJ      Partial                                      Acute               +---------+---------------+---------+-----------+----------+-------------------+ FV Prox  Full                                                             +---------+---------------+---------+-----------+----------+-------------------+ FV Mid   Full                                                             +---------+---------------+---------+-----------+----------+-------------------+ FV DistalFull                                                             +---------+---------------+---------+-----------+----------+-------------------+ PFV      Full                                                             +---------+---------------+---------+-----------+----------+-------------------+ POP      None           No       No                   Acute               +---------+---------------+---------+-----------+----------+-------------------+ PTV      None  Acute               +---------+---------------+---------+-----------+----------+-------------------+ PERO     None                                         Acute               +---------+---------------+---------+-----------+----------+-------------------+ EIV      Partial                                                           +---------+---------------+---------+-----------+----------+-------------------+ CIV      Partial                                                          +---------+---------------+---------+-----------+----------+-------------------+ Partial thrombus noted in the external iliac through to, at least, the mid common iliac. Unable to visualize the distal common iliac or the IVC secondary to bowel gas.  +---------+---------------+---------+-----------+----------+------------------+ LEFT     CompressibilityPhasicitySpontaneityPropertiesThrombus Aging     +---------+---------------+---------+-----------+----------+------------------+ CFV      Full                                         pulsatile waveform +---------+---------------+---------+-----------+----------+------------------+ SFJ      Full                                                            +---------+---------------+---------+-----------+----------+------------------+ FV Prox  Full                                                            +---------+---------------+---------+-----------+----------+------------------+ FV Mid   Full                                                            +---------+---------------+---------+-----------+----------+------------------+ FV DistalFull                                                            +---------+---------------+---------+-----------+----------+------------------+ PFV      Full                                                            +---------+---------------+---------+-----------+----------+------------------+  POP      Full                                         pulsatile waveform +---------+---------------+---------+-----------+----------+------------------+ PTV      None                                         Age Indeterminate  +---------+---------------+---------+-----------+----------+------------------+ PERO     Full                                                             +---------+---------------+---------+-----------+----------+------------------+     Summary: RIGHT: - Findings consistent with acute deep vein thrombosis involving the right common femoral vein, SF junction, right popliteal vein, right posterior tibial veins, and right peroneal veins. - Partial thrombus noted in the external iliac through to, at least, the mid common iliac. Unable to visualize the distal common iliac or the IVC secondary to bowel gas.  LEFT: - Findings consistent with age indeterminate deep vein thrombosis involving the left peroneal veins.  *See table(s) above for measurements and observations. Electronically signed by Deitra Mayo MD on 02/24/2021 at 6:44:50 PM.    Final       Subjective: Patient seen and examined at bedside.  No overnight fever, vomiting, chest pain or worsening shortness breath reported.  Patient wants to go home today.  Daughter present at bedside.  Discharge Exam: Vitals:   02/25/21 0052 02/25/21 0531  BP: 114/78 130/79  Pulse: 88 71  Resp: 13 16  Temp: 98.5 F (36.9 C) 98.4 F (36.9 C)  SpO2: 94% 97%    General exam: Appears calm and comfortable.  No acute distress.  Currently on room air.   Respiratory system: Decreased breath sounds at bases bilaterally with some crackles cardiovascular system: Rate controlled, S1-S2 heard gastrointestinal system: Abdomen is distended, soft and nontender.  Bowel sounds are heard. Extremities: right calf is bigger than left.  No clubbing Central nervous system: Awake and alert. No focal neurological deficits.  Moves extremities Skin: No obvious ecchymosis/lesions psychiatry: Affect is mostly flat    The results of significant diagnostics from this hospitalization (including imaging, microbiology, ancillary and laboratory) are listed below for reference.     Microbiology: Recent Results (from the past 240 hour(s))  Resp Panel by RT-PCR (Flu  A&B, Covid) Nasopharyngeal Swab     Status: None   Collection Time: 02/23/21 10:03 AM   Specimen: Nasopharyngeal Swab; Nasopharyngeal(NP) swabs in vial transport medium  Result Value Ref Range Status   SARS Coronavirus 2 by RT PCR NEGATIVE NEGATIVE Final    Comment: (NOTE) SARS-CoV-2 target nucleic acids are NOT DETECTED.  The SARS-CoV-2 RNA is generally detectable in upper respiratory specimens during the acute phase of infection. The lowest concentration of SARS-CoV-2 viral copies this assay can detect is 138 copies/mL. A negative result does not preclude SARS-Cov-2 infection and should not be used as the sole basis for treatment or other patient management decisions. A negative result may occur with  improper specimen collection/handling, submission of specimen other than nasopharyngeal swab, presence of viral mutation(s) within  the areas targeted by this assay, and inadequate number of viral copies(<138 copies/mL). A negative result must be combined with clinical observations, patient history, and epidemiological information. The expected result is Negative.  Fact Sheet for Patients:  EntrepreneurPulse.com.au  Fact Sheet for Healthcare Providers:  IncredibleEmployment.be  This test is no t yet approved or cleared by the [place] FDA and  has been authorized for detection and/or diagnosis of SARS-CoV-2 by FDA under an Emergency Use Authorization (EUA). This EUA will remain  in effect (meaning this test can be used) for the duration of the COVID-19 declaration under Section 564(b)(1) of the Act, 21 U.S.C.section 360bbb-3(b)(1), unless the authorization is terminated  or revoked sooner.       Influenza A by PCR NEGATIVE NEGATIVE Final   Influenza B by PCR NEGATIVE NEGATIVE Final    Comment: (NOTE) The Xpert Xpress SARS-CoV-2/FLU/RSV plus assay is intended as an aid in the diagnosis of influenza from Nasopharyngeal swab specimens  and should not be used as a sole basis for treatment. Nasal washings and aspirates are unacceptable for Xpert Xpress SARS-CoV-2/FLU/RSV testing.  Fact Sheet for Patients: EntrepreneurPulse.com.au  Fact Sheet for Healthcare Providers: IncredibleEmployment.be  This test is not yet approved or cleared by the [place] FDA and has been authorized for detection and/or diagnosis of SARS-CoV-2 by FDA under an Emergency Use Authorization (EUA). This EUA will remain in effect (meaning this test can be used) for the duration of the COVID-19 declaration under Section 564(b)(1) of the Act, 21 U.S.C. section 360bbb-3(b)(1), unless the authorization is terminated or revoked.  Performed at [facility], [address] [place], [place] [address]      Labs: BNP (last 3 results) Recent Labs    05/31/20 0553 12/10/20 1537 02/23/21 1015  BNP 82.6 40.0 [id number]*   Basic Metabolic Panel: Recent Labs  Lab 02/23/21 1015 02/24/21 0600 02/25/21 0109  NA 138 138 137  K 3.1* 3.9 3.6  CL 93* 100 100  CO2 32 30 30  GLUCOSE 166* 128* 128*  BUN '16 13 17  '$ CREATININE 1.14 1.07 1.16  CALCIUM 9.1 8.5* 8.8*   Liver Function Tests: Recent Labs  Lab 02/23/21 1015  AST 44*  ALT 24  ALKPHOS 55  BILITOT 1.2  PROT 6.1*  ALBUMIN 3.1*   Recent Labs  Lab 02/23/21 1015  LIPASE 25   No results for input(s): AMMONIA in the last 168 hours. CBC: Recent Labs  Lab 02/23/21 1015 02/24/21 0600 02/25/21 0109  WBC 10.3 9.3 7.4  NEUTROABS 7.8*  --   --   HGB 15.3 13.7 13.2  HCT 41.9 39.5 38.4*  MCV 98.1 101.5* 102.1*  PLT 243 205 185   Cardiac Enzymes: No results for input(s): CKTOTAL, CKMB, CKMBINDEX, TROPONINI in the last 168 hours. BNP: Invalid input(s): POCBNP CBG: No results for input(s): GLUCAP in the last 168 hours. D-Dimer No results for input(s): DDIMER in the last 72 hours. Hgb A1c No results for input(s): HGBA1C in the last 72  hours. Lipid Profile No results for input(s): CHOL, HDL, LDLCALC, TRIG, CHOLHDL, LDLDIRECT in the last 72 hours. Thyroid function studies No results for input(s): TSH, T4TOTAL, T3FREE, THYROIDAB in the last 72 hours.  Invalid input(s): FREET3 Anemia work up No results for input(s): VITAMINB12, FOLATE, FERRITIN, TIBC, IRON, RETICCTPCT in the last 72 hours. Urinalysis    Component Value Date/Time   COLORURINE YELLOW 12/12/2020 1623   APPEARANCEUR CLEAR 12/12/2020 1623   APPEARANCEUR Clear 09/09/2018 1113  LABSPEC 1.006 12/12/2020 1623   PHURINE 6.0 12/12/2020 1623   GLUCOSEU NEGATIVE 12/12/2020 1623   HGBUR SMALL (A) 12/12/2020 1623   BILIRUBINUR NEGATIVE 12/12/2020 1623   BILIRUBINUR Negative 09/09/2018 1113   KETONESUR 5 (A) 12/12/2020 1623   PROTEINUR NEGATIVE 12/12/2020 1623   UROBILINOGEN negative 04/11/2015 1012   UROBILINOGEN 1.0 04/09/2014 2250   NITRITE NEGATIVE 12/12/2020 1623   LEUKOCYTESUR NEGATIVE 12/12/2020 1623   Sepsis Labs Invalid input(s): PROCALCITONIN,  WBC,  LACTICIDVEN Microbiology Recent Results (from the past 240 hour(s))  Resp Panel by RT-PCR (Flu A&B, Covid) Nasopharyngeal Swab     Status: None   Collection Time: 02/23/21 10:03 AM   Specimen: Nasopharyngeal Swab; Nasopharyngeal(NP) swabs in vial transport medium  Result Value Ref Range Status   SARS Coronavirus 2 by RT PCR NEGATIVE NEGATIVE Final    Comment: (NOTE) SARS-CoV-2 target nucleic acids are NOT DETECTED.  The SARS-CoV-2 RNA is generally detectable in upper respiratory specimens during the acute phase of infection. The lowest concentration of SARS-CoV-2 viral copies this assay can detect is 138 copies/mL. A negative result does not preclude SARS-Cov-2 infection and should not be used as the sole basis for treatment or other patient management decisions. A negative result may occur with  improper specimen collection/handling, submission of specimen other than nasopharyngeal swab,  presence of viral mutation(s) within the areas targeted by this assay, and inadequate number of viral copies(<138 copies/mL). A negative result must be combined with clinical observations, patient history, and epidemiological information. The expected result is Negative.  Fact Sheet for Patients:  EntrepreneurPulse.com.au  Fact Sheet for Healthcare Providers:  IncredibleEmployment.be  This test is no t yet approved or cleared by the [place] FDA and  has been authorized for detection and/or diagnosis of SARS-CoV-2 by FDA under an Emergency Use Authorization (EUA). This EUA will remain  in effect (meaning this test can be used) for the duration of the COVID-19 declaration under Section 564(b)(1) of the Act, 21 U.S.C.section 360bbb-3(b)(1), unless the authorization is terminated  or revoked sooner.       Influenza A by PCR NEGATIVE NEGATIVE Final   Influenza B by PCR NEGATIVE NEGATIVE Final    Comment: (NOTE) The Xpert Xpress SARS-CoV-2/FLU/RSV plus assay is intended as an aid in the diagnosis of influenza from Nasopharyngeal swab specimens and should not be used as a sole basis for treatment. Nasal washings and aspirates are unacceptable for Xpert Xpress SARS-CoV-2/FLU/RSV testing.  Fact Sheet for Patients: EntrepreneurPulse.com.au  Fact Sheet for Healthcare Providers: IncredibleEmployment.be  This test is not yet approved or cleared by the [place] FDA and has been authorized for detection and/or diagnosis of SARS-CoV-2 by FDA under an Emergency Use Authorization (EUA). This EUA will remain in effect (meaning this test can be used) for the duration of the COVID-19 declaration under Section 564(b)(1) of the Act, 21 U.S.C. section 360bbb-3(b)(1), unless the authorization is terminated or revoked.  Performed at [facility], [address] [place], [place] [address]      Time  coordinating discharge: 35 minutes  SIGNED:   Aline August, MD  [facility] 02/25/2021, 11:32 AM

## 2021-02-25 NOTE — TOC Transition Note (Signed)
Transition of Care [facility]) - CM/SW Discharge Note   Patient Details  Name: [name] [name] MRN: [id number] Date of Birth: [birth year]  [facility]) CM/SW Contact:  Pollie Friar, RN Phone Number: 02/25/2021, 12:41 PM   Clinical Narrative:    Pt is discharging home with self care.  Pt unable to receive 30 day free eliquis card as pt has been on eliquis in the past. Pt is comfortable with $45/ month co pay.  Wife is at the bedside and will provide transport home.   Final next level of care: Home/Self Care Barriers to Discharge: No Barriers Identified   Patient Goals and CMS Choice        Discharge Placement                       Discharge Plan and Services                                     Social Determinants of Health (SDOH) Interventions     Readmission Risk Interventions No flowsheet data found.

## 2021-02-26 ENCOUNTER — Telehealth: Payer: Self-pay

## 2021-02-26 NOTE — Telephone Encounter (Signed)
Transition Care Management Follow-up Telephone Call Date of discharge and from where: [facility] Diagnosis: pulmonary embolism How have you been since you were released from the hospital? fine Any questions or concerns? No  Items Reviewed: Did the pt receive and understand the discharge instructions provided? Yes  Medications obtained and verified? Yes  Other? No  Any new allergies since your discharge? No  Dietary orders reviewed? Yes Do you have support at home? Yes   Home Care and Equipment/Supplies: Were home health services ordered? no If so, what is the name of the agency? N/a  Has the agency set up a time to come to the patient's home? not applicable Were any new equipment or medical supplies ordered?  No What is the name of the medical supply agency? N/a Were you able to get the supplies/equipment? not applicable Do you have any questions related to the use of the equipment or supplies? No  Functional Questionnaire: (I = Independent and D = Dependent) ADLs: I  Bathing/Dressing- I  Meal Prep- I  Eating- I  Maintaining continence- I  Transferring/Ambulation- I  Managing Meds- I  Follow up appointments reviewed:  PCP Hospital f/u appt confirmed? Yes  Scheduled to see Marjorie Smolder on 03/01/21 @ 11. [facility] f/u appt confirmed? No   Are transportation arrangements needed? No  If their condition worsens, is the pt aware to call PCP or go to the Emergency Dept.? Yes Was the patient provided with contact information for the PCP's office or ED? Yes Was to pt encouraged to call back with questions or concerns? Yes

## 2021-03-01 ENCOUNTER — Ambulatory Visit (INDEPENDENT_AMBULATORY_CARE_PROVIDER_SITE_OTHER): Payer: Medicare HMO | Admitting: Family Medicine

## 2021-03-01 ENCOUNTER — Other Ambulatory Visit: Payer: Self-pay

## 2021-03-01 ENCOUNTER — Encounter: Payer: Self-pay | Admitting: Family Medicine

## 2021-03-01 VITALS — BP 142/85 | HR 77 | Temp 97.7°F | Ht 67.0 in | Wt 238.0 lb

## 2021-03-01 DIAGNOSIS — Z09 Encounter for follow-up examination after completed treatment for conditions other than malignant neoplasm: Secondary | ICD-10-CM | POA: Diagnosis not present

## 2021-03-01 DIAGNOSIS — I82401 Acute embolism and thrombosis of unspecified deep veins of right lower extremity: Secondary | ICD-10-CM

## 2021-03-01 DIAGNOSIS — I2609 Other pulmonary embolism with acute cor pulmonale: Secondary | ICD-10-CM

## 2021-03-01 DIAGNOSIS — I82402 Acute embolism and thrombosis of unspecified deep veins of left lower extremity: Secondary | ICD-10-CM | POA: Diagnosis not present

## 2021-03-01 DIAGNOSIS — I2699 Other pulmonary embolism without acute cor pulmonale: Secondary | ICD-10-CM | POA: Diagnosis not present

## 2021-03-01 DIAGNOSIS — I10 Essential (primary) hypertension: Secondary | ICD-10-CM

## 2021-03-01 MED ORDER — APIXABAN 5 MG PO TABS: 5.0000 mg | ORAL_TABLET | Freq: Two times a day (BID) | ORAL | 0 refills | Status: DC

## 2021-03-01 NOTE — Progress Notes (Signed)
Established Patient Office Visit  Subjective:  Patient ID: [name] [name], male    DOB: 04-14-1946  Age: 75 y.o. MRN: [id number]  CC:  Chief Complaint  Patient presents with   Hospitalization Follow-up    [facility]- PE    HPI [name].   Today's visit was for Transitional Care Management.  The patient was discharged from [facility] on 02/25/21 with a primary diagnosis of acute pulmonary embolism.   Contact with the patient and/or caregiver, by a clinical staff member, was made on 02/26/21 and was documented as a telephone encounter within the EMR.  Through chart review and discussion with the patient I have determined that management of their condition is of moderate complexity.   [name] [name] presented to the ED on 02/22/21 for worsening shortness of breath. He has a history of PE due to Covid and was taken off anticoagulation in April of 2022. He was diagnosed with a submassive PE with right heart strain in the ED. He was started on a heparin drip. He was also found to have an acute right LE DVT and indeterminate left LE DVT. He was discharged on Eliquis. He will need lifelong anticoagulation.   [name] [name] reports that he feels significantly better. He denies shortness of breath, chest pain, dizziness, lightheadedness, palpitation, changes in vision, or weakness. He is having some edema at times in his right leg.    Past Medical History:  Diagnosis Date   Colon polyp    GERD (gastroesophageal reflux disease)    Hemorrhoid    Hyperplasia of prostate    Hypertension    Other and unspecified hyperlipidemia    Other testicular hypofunction    Pneumonia    Pulmonary embolism (HCC)    Sinus bradycardia     Past Surgical History:  Procedure Laterality Date   APPENDECTOMY     HAND SURGERY Right     Family History  Problem Relation Age of Onset   Heart attack Mother    Stroke Mother    Diabetes Father    Diabetes Daughter     Social History   Socioeconomic  History   Marital status: Married    Spouse name: Psychiatrist   Number of children: 3   Years of education: 10   Highest education level: 10th grade  Occupational History   Occupation: retired  Tobacco Use   Smoking status: Former    Types: Cigars    Quit date: 2015    Years since quitting: 7.5   Smokeless tobacco: Current    Types: Chew   Tobacco comments:    only 1 cigar on [employer] Use   Vaping Use: Never used  Substance and Sexual Activity   Alcohol use: Yes    Comment: daily, 1/2 pint   Drug use: No   Sexual activity: Not Currently  Other Topics Concern   Not on file  Social History Narrative   Not on file   Social Determinants of Health   Financial Resource Strain: Not on file  Food Insecurity: Not on file  Transportation Needs: Not on file  Physical Activity: Not on file  Stress: Not on file  Social Connections: Not on file  Intimate Partner Violence: Not on file    Outpatient Medications Prior to Visit  Medication Sig Dispense Refill   APIXABAN (ELIQUIS) VTE STARTER PACK (10MG AND 5MG) Take as directed on package: start with two-65m tablets twice daily for 7 days. On day 8, switch to  one-77m tablet twice daily. 1 each 0   tadalafil (CIALIS) 5 MG tablet Take 5 mg by mouth daily as needed for erectile dysfunction.     vitamin C (ASCORBIC ACID) 500 MG tablet Take 500 mg by mouth daily.     omeprazole (PRILOSEC) 20 MG capsule Take 1 capsule (20 mg total) by mouth daily. 90 capsule 3   B Complex Vitamins (VITAMIN B COMPLEX PO) Take 1 tablet by mouth daily. (Patient not taking: Reported on 03/01/2021)     No facility-administered medications prior to visit.    Allergies  Allergen Reactions   Cialis [Tadalafil] Other (See Comments)    Not allergic 02/23/21   Viagra [Sildenafil Citrate] Other (See Comments)    heartburn    ROS Review of Systems As per HPI.    Objective:    Physical Exam Vitals and nursing note reviewed.  Constitutional:       General: He is not in acute distress.    Appearance: He is not ill-appearing, toxic-appearing or diaphoretic.  Cardiovascular:     Rate and Rhythm: Normal rate and regular rhythm.     Heart sounds: Normal heart sounds. No murmur heard. Pulmonary:     Effort: Pulmonary effort is normal.     Breath sounds: Normal breath sounds.  Musculoskeletal:     Right lower leg: 1+ Edema present.     Left lower leg: No edema.  Skin:    General: Skin is warm and dry.  Neurological:     General: No focal deficit present.     Mental Status: He is alert and oriented to person, place, and time.  Psychiatric:        Mood and Affect: Mood normal.        Behavior: Behavior normal.    BP (!) 142/85   Pulse 77   Temp 97.7 F (36.5 C) (Temporal)   Ht _0  (1.702 m)   Wt 238 lb (108 kg)   SpO2 98%   BMI 37.28 kg/m  Wt Readings from Last 3 Encounters:  03/01/21 238 lb (108 kg)  02/23/21 230 lb 13.2 oz (104.7 kg)  02/15/21 230 lb (104.3 kg)     Health Maintenance Due  Topic Date Due   INFLUENZA VACCINE  02/26/2021    There are no preventive care reminders to display for this patient.  Lab Results  Component Value Date   TSH 3.430 12/29/2015   Lab Results  Component Value Date   WBC 7.4 02/25/2021   HGB 13.2 02/25/2021   HCT 38.4 (L) 02/25/2021   MCV 102.1 (H) 02/25/2021   PLT 185 02/25/2021   Lab Results  Component Value Date   NA 137 02/25/2021   K 3.6 02/25/2021   CO2 30 02/25/2021   GLUCOSE 128 (H) 02/25/2021   BUN 17 02/25/2021   CREATININE 1.16 02/25/2021   BILITOT 1.2 02/23/2021   ALKPHOS 55 02/23/2021   AST 44 (H) 02/23/2021   ALT 24 02/23/2021   PROT 6.1 (L) 02/23/2021   ALBUMIN 3.1 (L) 02/23/2021   CALCIUM 8.8 (L) 02/25/2021   ANIONGAP 7 02/25/2021   EGFR 74 02/12/2021   Lab Results  Component Value Date   CHOL 152 10/11/2020   Lab Results  Component Value Date   HDL 52 10/11/2020   Lab Results  Component Value Date   LDLCALC 80 10/11/2020   Lab  Results  Component Value Date   TRIG 108 10/11/2020   Lab Results  Component Value Date  CHOLHDL 2.9 10/11/2020   Lab Results  Component Value Date   HGBA1C 5.6% 10/11/2013      Assessment & Plan:   [name] [name] was seen today for hospitalization follow-up.  Diagnoses and all orders for this visit:  Acute pulmonary embolism with acute cor pulmonale, unspecified pulmonary embolism type (HCC) Denies shortness of breath or chest pain. Labs pending as below. Continue Eliquis 5 mg BID after completing starter pack.  -     BMP8+EGFR -     CBC with Differential/Platelet -     apixaban (ELIQUIS) 5 MG TABS tablet; Take 1 tablet (5 mg total) by mouth 2 (two) times daily.  Recurrent pulmonary emboli (HCC) Will need lifelong anticoagulation. Continue Eliquis.  -     BMP8+EGFR -     CBC with Differential/Platelet -     apixaban (ELIQUIS) 5 MG TABS tablet; Take 1 tablet (5 mg total) by mouth 2 (two) times daily.  Acute deep vein thrombosis (DVT) of right lower extremity, unspecified vein (HCC) Mild intermittent edema of right leg. Continue eliquis.  -     apixaban (ELIQUIS) 5 MG TABS tablet; Take 1 tablet (5 mg total) by mouth 2 (two) times daily.  Deep vein thrombosis (DVT) of left lower extremity, unspecified chronicity, unspecified vein (HCC) Indeterminate age based on [place]. Continue Eliquis.  -     apixaban (ELIQUIS) 5 MG TABS tablet; Take 1 tablet (5 mg total) by mouth 2 (two) times daily.  Primary hypertension A little elevated today. He does not wish to restart medication for BP at this point. Monitor BPs at home and notify for persistent high readings. Follow up with PCP in 4 weeks for recheck.   Hospital discharge follow-up Reviewed record.  Follow-up: Return in about 4 weeks (around 03/29/2021) for with PCP for follow up.   The patient indicates understanding of these issues and agrees with the plan.   Gwenlyn Perking, FNP

## 2021-03-02 ENCOUNTER — Telehealth: Payer: Self-pay | Admitting: Family Medicine

## 2021-03-02 ENCOUNTER — Ambulatory Visit (INDEPENDENT_AMBULATORY_CARE_PROVIDER_SITE_OTHER): Payer: Medicare HMO

## 2021-03-02 VITALS — Ht 67.0 in | Wt 242.0 lb

## 2021-03-02 DIAGNOSIS — O10019 Pre-existing essential hypertension complicating pregnancy, unspecified trimester: Secondary | ICD-10-CM | POA: Insufficient documentation

## 2021-03-02 DIAGNOSIS — Z0000 Encounter for general adult medical examination without abnormal findings: Secondary | ICD-10-CM | POA: Diagnosis not present

## 2021-03-02 LAB — CBC WITH DIFFERENTIAL/PLATELET
Basophils Absolute: 0.1 10*3/uL (ref 0.0–0.2)
Basos: 1 %
EOS (ABSOLUTE): 0.3 10*3/uL (ref 0.0–0.4)
Eos: 4 %
Hematocrit: 41.2 % (ref 37.5–51.0)
Hemoglobin: 14.4 g/dL (ref 13.0–17.7)
Immature Grans (Abs): 0 10*3/uL (ref 0.0–0.1)
Immature Granulocytes: 1 %
Lymphocytes Absolute: 1.9 10*3/uL (ref 0.7–3.1)
Lymphs: 28 %
MCH: 35.2 pg — ABNORMAL HIGH (ref 26.6–33.0)
MCHC: 35 g/dL (ref 31.5–35.7)
MCV: 101 fL — ABNORMAL HIGH (ref 79–97)
Monocytes Absolute: 0.4 10*3/uL (ref 0.1–0.9)
Monocytes: 6 %
Neutrophils Absolute: 4.1 10*3/uL (ref 1.4–7.0)
Neutrophils: 60 %
Platelets: 201 10*3/uL (ref 150–450)
RBC: 4.09 x10E6/uL — ABNORMAL LOW (ref 4.14–5.80)
RDW: 12.8 % (ref 11.6–15.4)
WBC: 6.7 10*3/uL (ref 3.4–10.8)

## 2021-03-02 LAB — BMP8+EGFR
BUN/Creatinine Ratio: 15 (ref 10–24)
BUN: 13 mg/dL (ref 8–27)
CO2: 28 mmol/L (ref 20–29)
Calcium: 9.6 mg/dL (ref 8.6–10.2)
Chloride: 103 mmol/L (ref 96–106)
Creatinine, Ser: 0.86 mg/dL (ref 0.76–1.27)
Glucose: 117 mg/dL — ABNORMAL HIGH (ref 65–99)
Potassium: 4.9 mmol/L (ref 3.5–5.2)
Sodium: 143 mmol/L (ref 134–144)
eGFR: 90 mL/min/{1.73_m2} (ref >59)

## 2021-03-02 MED ORDER — FUROSEMIDE 20 MG PO TABS: 20.0000 mg | ORAL_TABLET | Freq: Every day | ORAL | 0 refills | Status: DC

## 2021-03-02 NOTE — Telephone Encounter (Signed)
Lasix sent. Pt made aware. Pt states that he has an appt in a month. Per [facility] ok to wait until that appt.

## 2021-03-02 NOTE — Patient Instructions (Signed)
[name] [name] , Thank you for taking time to come for your Medicare Wellness Visit. I appreciate your ongoing commitment to your health goals. Please review the following plan we discussed and let me know if I can assist you in the future.   Screening recommendations/referrals: Colonoscopy: Done 10/17/2020 - no repeat required Recommended yearly ophthalmology/optometry visit for glaucoma screening and checkup Recommended yearly dental visit for hygiene and checkup  Vaccinations: Influenza vaccine: Declined Pneumococcal vaccine: Done 2011 & 2014 Tdap vaccine: Done 2005 - Repeat in 10 years *Due Shingles vaccine: Due. Shingrix discussed. Please contact your pharmacy for coverage information.     Covid-19: Declined  Conditions/risks identified: Aim for 30 minutes of exercise or brisk walking each day, drink 6-8 glasses of water and eat lots of fruits and vegetables.   Next appointment: Follow up in one year for your annual wellness visit.   Preventive Care 23 Years and Older, Male  Preventive care refers to lifestyle choices and visits with your health care provider that can promote health and wellness. What does preventive care include? A yearly physical exam. This is also called an annual well check. Dental exams once or twice a year. Routine eye exams. Ask your health care provider how often you should have your eyes checked. Personal lifestyle choices, including: Daily care of your teeth and gums. Regular physical activity. Eating a healthy diet. Avoiding tobacco and drug use. Limiting alcohol use. Practicing safe sex. Taking low doses of aspirin every day. Taking vitamin and mineral supplements as recommended by your health care provider. What happens during an annual well check? The services and screenings done by your health care provider during your annual well check will depend on your age, overall health, lifestyle risk factors, and family history of disease. Counseling   Your health care provider may ask you questions about your: Alcohol use. Tobacco use. Drug use. Emotional well-being. Home and relationship well-being. Sexual activity. Eating habits. History of falls. Memory and ability to understand (cognition). Work and work Statistician. Screening  You may have the following tests or measurements: Height, weight, and BMI. Blood pressure. Lipid and cholesterol levels. These may be checked every 5 years, or more frequently if you are over 77 years old. Skin check. Lung cancer screening. You may have this screening every year starting at age 77 if you have a 30-pack-year history of smoking and currently smoke or have quit within the past 15 years. Fecal occult blood test (FOBT) of the stool. You may have this test every year starting at age 77. Flexible sigmoidoscopy or colonoscopy. You may have a sigmoidoscopy every 5 years or a colonoscopy every 10 years starting at age 77. Prostate cancer screening. Recommendations will vary depending on your family history and other risks. Hepatitis C blood test. Hepatitis B blood test. Sexually transmitted disease (STD) testing. Diabetes screening. This is done by checking your blood sugar (glucose) after you have not eaten for a while (fasting). You may have this done every 1-3 years. Abdominal aortic aneurysm (AAA) screening. You may need this if you are a current or former smoker. Osteoporosis. You may be screened starting at age 77 if you are at high risk. Talk with your health care provider about your test results, treatment options, and if necessary, the need for more tests. Vaccines  Your health care provider may recommend certain vaccines, such as: Influenza vaccine. This is recommended every year. Tetanus, diphtheria, and acellular pertussis (Tdap, Td) vaccine. You may need a Td booster every  10 years. Zoster vaccine. You may need this after age 77. Pneumococcal 13-valent conjugate (PCV13) vaccine.  One dose is recommended after age 77. Pneumococcal polysaccharide (PPSV23) vaccine. One dose is recommended after age 77. Talk to your health care provider about which screenings and vaccines you need and how often you need them. This information is not intended to replace advice given to you by your health care provider. Make sure you discuss any questions you have with your health care provider. Document Released: 08/11/2015 Document Revised: 04/03/2016 Document Reviewed: 05/16/2015 Elsevier Interactive Patient Education  2017 [place] Prevention in the Home Falls can cause injuries. They can happen to people of all ages. There are many things you can do to make your home safe and to help prevent falls. What can I do on the outside of my home? Regularly fix the edges of walkways and driveways and fix any cracks. Remove anything that might make you trip as you walk through a door, such as a raised step or threshold. Trim any bushes or trees on the path to your home. Use bright outdoor lighting. Clear any walking paths of anything that might make someone trip, such as rocks or tools. Regularly check to see if handrails are loose or broken. Make sure that both sides of any steps have handrails. Any raised decks and porches should have guardrails on the edges. Have any leaves, snow, or ice cleared regularly. Use sand or salt on walking paths during winter. Clean up any spills in your garage right away. This includes oil or grease spills. What can I do in the bathroom? Use night lights. Install grab bars by the toilet and in the tub and shower. Do not use towel bars as grab bars. Use non-skid mats or decals in the tub or shower. If you need to sit down in the shower, use a plastic, non-slip stool. Keep the floor dry. Clean up any water that spills on the floor as soon as it happens. Remove soap buildup in the tub or shower regularly. Attach bath mats securely with double-sided  non-slip rug tape. Do not have throw rugs and other things on the floor that can make you trip. What can I do in the bedroom? Use night lights. Make sure that you have a light by your bed that is easy to reach. Do not use any sheets or blankets that are too big for your bed. They should not hang down onto the floor. Have a firm chair that has side arms. You can use this for support while you get dressed. Do not have throw rugs and other things on the floor that can make you trip. What can I do in the kitchen? Clean up any spills right away. Avoid walking on wet floors. Keep items that you use a lot in easy-to-reach places. If you need to reach something above you, use a strong step stool that has a grab bar. Keep electrical cords out of the way. Do not use floor polish or wax that makes floors slippery. If you must use wax, use non-skid floor wax. Do not have throw rugs and other things on the floor that can make you trip. What can I do with my stairs? Do not leave any items on the stairs. Make sure that there are handrails on both sides of the stairs and use them. Fix handrails that are broken or loose. Make sure that handrails are as long as the stairways. Check any carpeting to make  sure that it is firmly attached to the stairs. Fix any carpet that is loose or worn. Avoid having throw rugs at the top or bottom of the stairs. If you do have throw rugs, attach them to the floor with carpet tape. Make sure that you have a light switch at the top of the stairs and the bottom of the stairs. If you do not have them, ask someone to add them for you. What else can I do to help prevent falls? Wear shoes that: Do not have high heels. Have rubber bottoms. Are comfortable and fit you well. Are closed at the toe. Do not wear sandals. If you use a stepladder: Make sure that it is fully opened. Do not climb a closed stepladder. Make sure that both sides of the stepladder are locked into place. Ask  someone to hold it for you, if possible. Clearly mark and make sure that you can see: Any grab bars or handrails. First and last steps. Where the edge of each step is. Use tools that help you move around (mobility aids) if they are needed. These include: Canes. Walkers. Scooters. Crutches. Turn on the lights when you go into a dark area. Replace any light bulbs as soon as they burn out. Set up your furniture so you have a clear path. Avoid moving your furniture around. If any of your floors are uneven, fix them. If there are any pets around you, be aware of where they are. Review your medicines with your doctor. Some medicines can make you feel dizzy. This can increase your chance of falling. Ask your doctor what other things that you can do to help prevent falls. This information is not intended to replace advice given to you by your health care provider. Make sure you discuss any questions you have with your health care provider. Document Released: 05/11/2009 Document Revised: 12/21/2015 Document Reviewed: 08/19/2014 Elsevier Interactive Patient Education  2017 [employer].

## 2021-03-02 NOTE — Telephone Encounter (Signed)
Please advise, discharge instruction from hospital state to discontinue Lasix.

## 2021-03-02 NOTE — Telephone Encounter (Signed)
Give him lasix '20mg'$  daily for 2 weeks and have him make a f/u with me asap

## 2021-03-02 NOTE — Telephone Encounter (Signed)
Jonelle Sidle is off today so we are unable to run it by here

## 2021-03-02 NOTE — Telephone Encounter (Signed)
I did not see him after the hospitalization, looks like he followed up with Tiffany for transition of care, looks like she looked at the swelling, looks like his kidney function is fine so I would not be close to Lasix some of the time but is not something we like to continue all of the time unless necessary, may be run it by Tiffany and see what she thought after the hospital follow-up if she had any other ideas, if not then we will discuss further at his next follow-up, hopefully he has a follow-up with me soon if not have him schedule 1

## 2021-03-02 NOTE — Progress Notes (Signed)
Subjective:   [name] [name] is a 75 y.o. male who presents for Medicare Annual/Subsequent preventive examination.  Virtual Visit via Telephone Note  I connected with  [name] [name] on 03/02/21 at  8:15 AM EDT by telephone and verified that I am speaking with the correct person using two identifiers.  Location: Patient: Home Provider: [facility] Persons participating in the virtual visit: patient/Nurse Health Advisor   I discussed the limitations, risks, security and privacy concerns of performing an evaluation and management service by telephone and the availability of in person appointments. The patient expressed understanding and agreed to proceed.  Interactive audio and video telecommunications were attempted between this nurse and patient, however failed, due to patient having technical difficulties OR patient did not have access to video capability.  We continued and completed visit with audio only.  Some vital signs may be absent or patient reported.   [name] [name] E [name] Cohill, LPN   Review of Systems     Cardiac Risk Factors include: advanced age (>33mn, >>21women);dyslipidemia;hypertension;obesity (BMI >30kg/m2);sedentary lifestyle;male gender;Other (see comment), Risk factor comments: alcohol abuse     Objective:    Today's Vitals   03/02/21 0827  Weight: 242 lb (109.8 kg)  Height: '5\' 7"'$  (1.702 m)  PainSc: 0-No pain   Body mass index is 37.9 kg/m.  Advanced Directives 02/23/2021 02/23/2021 12/12/2020 12/10/2020 12/09/2020 09/16/2020 08/15/2020  Does Patient Have a Medical Advance Directive? Yes Yes No No No No No  Type of Advance Directive Living will - - - - - -  Does patient want to make changes to medical advance directive? No - Patient declined - - - - - -  Would patient like information on creating a medical advance directive? - - No - Patient declined Yes (ED - Information included in AVS) No - Patient declined No - Patient declined No - Patient declined    Current  Medications (verified) Outpatient Encounter Medications as of 03/02/2021  Medication Sig   apixaban (ELIQUIS) 5 MG TABS tablet Take 1 tablet (5 mg total) by mouth 2 (two) times daily.   tadalafil (CIALIS) 5 MG tablet Take 5 mg by mouth daily as needed for erectile dysfunction.   vitamin C (ASCORBIC ACID) 500 MG tablet Take 500 mg by mouth daily.   [DISCONTINUED] APIXABAN (ELIQUIS) VTE STARTER PACK ('10MG'$  AND '5MG'$ ) Take as directed on package: start with two-'5mg'$  tablets twice daily for 7 days. On day 8, switch to one-'5mg'$  tablet twice daily.   [DISCONTINUED] amLODipine (NORVASC) 5 MG tablet Take 1 tablet (5 mg total) by mouth daily. (Patient not taking: Reported on 05/31/2020)   No facility-administered encounter medications on file as of 03/02/2021.    Allergies (verified) Cialis [tadalafil] and Viagra [sildenafil citrate]   History: Past Medical History:  Diagnosis Date   Colon polyp    GERD (gastroesophageal reflux disease)    Hemorrhoid    Hyperplasia of prostate    Hypertension    Other and unspecified hyperlipidemia    Other testicular hypofunction    Pneumonia    Pulmonary embolism (HCC)    Sinus bradycardia    Past Surgical History:  Procedure Laterality Date   APPENDECTOMY     HAND SURGERY Right    Family History  Problem Relation Age of Onset   Heart attack Mother    Stroke Mother    Diabetes Father    Diabetes Daughter    Social History   Socioeconomic History   Marital status: Widowed  Spouse name: [name] [name]   Number of children: 3   Years of education: 10   Highest education level: 10th grade  Occupational History   Occupation: retired  Tobacco Use   Smoking status: Former    Types: Cigars    Quit date: 2015    Years since quitting: 7.5   Smokeless tobacco: Current    Types: Chew   Tobacco comments:    only 1 cigar on [employer] Use   Vaping Use: Never used  Substance and Sexual Activity   Alcohol use: Yes    Comment: daily, up to 1/2 pint    Drug use: No   Sexual activity: Not Currently  Other Topics Concern   Not on file  Social History Narrative   Grandson lives with him   Social Determinants of Health   Financial Resource Strain: Low Risk    Difficulty of Paying Living Expenses: Not very hard  Food Insecurity: No Food Insecurity   Worried About Charity fundraiser in the Last Year: Never true   [place] in the Last Year: Never true  Transportation Needs: No Transportation Needs   Lack of Transportation (Medical): No   Lack of Transportation (Non-Medical): No  Physical Activity: Insufficiently Active   Days of Exercise per Week: 7 days   Minutes of Exercise per Session: 10 min  Stress: No Stress Concern Present   Feeling of Stress : Not at all  Social Connections: Socially Isolated   Frequency of Communication with Friends and Family: More than three times a week   Frequency of Social Gatherings with Friends and Family: More than three times a week   Attends Religious Services: Never   Marine scientist or Organizations: Not on file   Attends Archivist Meetings: Never   Marital Status: Widowed    Tobacco Counseling Ready to quit: Not Answered Counseling given: Not Answered Tobacco comments: only 1 cigar on occassion   Clinical Intake:  Pre-visit preparation completed: Yes  Pain : No/denies pain Pain Score: 0-No pain     BMI - recorded: 37.9 Nutritional Status: BMI > 30  Obese Nutritional Risks: None Diabetes: No  How often do you need to have someone help you when you read instructions, pamphlets, or other written materials from your doctor or pharmacy?: 1 - Never  Diabetic? No  Interpreter Needed?: No  Information entered by :: Dimitris Shanahan, LPN   Activities of Daily Living In your present state of health, do you have any difficulty performing the following activities: 03/02/2021 02/23/2021  Hearing? N N  Vision? N N  Difficulty concentrating or making decisions? N  N  Walking or climbing stairs? Y N  Dressing or bathing? N N  Doing errands, shopping? N N  Preparing Food and eating ? N -  Using the Toilet? N -  In the past six months, have you accidently leaked urine? Y -  Comment if he doesn't hurry -  Do you have problems with loss of bowel control? N -  Managing your Medications? N -  Managing your Finances? N -  Housekeeping or managing your Housekeeping? N -  Some recent data might be hidden    Patient Care Team: Dettinger, Fransisca Kaufmann, MD as PCP - General (Family Medicine) Margaretha Seeds, MD as Consulting Physician (Pulmonary Disease) Harlen Labs, MD as Referring Physician (Optometry) Florinda Marker as Physician Assistant (Orthopedic Surgery)  Indicate any recent Medical Services you may  have received from other than [facility] providers in the past year (date may be approximate).     Assessment:   This is a routine wellness examination for [employer].  Hearing/Vision screen Hearing Screening - Comments:: Denies hearing difficulties  Vision Screening - Comments:: Wears eyeglasses - up to date with annual eye exams with Dr Marin Comment at [name] in [place]  Dietary issues and exercise activities discussed: Current Exercise Habits: The patient does not participate in regular exercise at present, Type of exercise: walking;stretching, Time (Minutes): 10, Frequency (Times/Week): 7, Weekly Exercise (Minutes/Week): 70, Intensity: Mild, Exercise limited by: orthopedic condition(s);cardiac condition(s)   Goals Addressed             This Visit's Progress    Exercise 3x per week (30 min per time)   Not on track      Depression Screen PHQ 2/9 Scores 03/02/2021 03/01/2021 02/23/2021 02/23/2021 02/15/2021 01/12/2021 10/11/2020  PHQ - 2 Score 0 0 0 0 0 0 0  PHQ- 9 Score 8 - 8 - - - -    Fall Risk Fall Risk  03/02/2021 03/01/2021 02/23/2021 02/15/2021 02/12/2021  Falls in the past year? '1 1 1 1 1  '$ Number falls in past yr: '1 1 1 1 1  '$ Injury with  Fall? '1 1 1 1 1  '$ Comment - - - - hit head  Risk for fall due to : Impaired balance/gait;History of fall(s);Impaired vision;Orthopedic patient Impaired balance/gait Impaired balance/gait Impaired balance/gait -  Follow up Education provided;Falls prevention discussed Falls evaluation completed - Falls evaluation completed -    FALL RISK PREVENTION PERTAINING TO THE HOME:  Any stairs in or around the home? Yes  If so, are there any without handrails? No  Home free of loose throw rugs in walkways, pet beds, electrical cords, etc? Yes  Adequate lighting in your home to reduce risk of falls? Yes   ASSISTIVE DEVICES UTILIZED TO PREVENT FALLS:  Life alert? No  Use of a cane, walker or w/c? No  Grab bars in the bathroom? No  Shower chair or bench in shower? Yes  Elevated toilet seat or a handicapped toilet? No   TIMED UP AND GO:  Was the test performed? No . Telephonic visit  Cognitive Function: MMSE - Mini Mental State Exam 02/13/2018  Orientation to time 5  Orientation to Place 5  Registration 3  Attention/ Calculation 5  Recall 2  Language- name 2 objects 2  Language- repeat 1  Language- follow 3 step command 3  Language- read & follow direction 1  Write a sentence 1  Copy design 1  Total score 29     6CIT Screen 03/02/2021 02/19/2019  What Year? 0 points 0 points  What month? 0 points 0 points  What time? 0 points 0 points  Count back from 20 0 points 0 points  Months in reverse 2 points 0 points  Repeat phrase 2 points 4 points  Total Score 4 4    Immunizations Immunization History  Administered Date(s) Administered   Pneumococcal Conjugate-13 05/03/2013   Pneumococcal Polysaccharide-23 07/29/2009   Td 07/30/2003    TDAP status: Due, Education has been provided regarding the importance of this vaccine. Advised may receive this vaccine at local pharmacy or Health Dept. Aware to provide a copy of the vaccination record if obtained from local pharmacy or Health Dept.  Verbalized acceptance and understanding.  Flu Vaccine status: Declined, Education has been provided regarding the importance of this vaccine but patient still  declined. Advised may receive this vaccine at local pharmacy or Health Dept. Aware to provide a copy of the vaccination record if obtained from local pharmacy or Health Dept. Verbalized acceptance and understanding.  Pneumococcal vaccine status: Up to date  Covid-19 vaccine status: Declined, Education has been provided regarding the importance of this vaccine but patient still declined. Advised may receive this vaccine at local pharmacy or Health Dept.or vaccine clinic. Aware to provide a copy of the vaccination record if obtained from local pharmacy or Health Dept. Verbalized acceptance and understanding.  Qualifies for Shingles Vaccine? Yes   Zostavax completed No   Shingrix Completed?: No.    Education has been provided regarding the importance of this vaccine. Patient has been advised to call insurance company to determine out of pocket expense if they have not yet received this vaccine. Advised may also receive vaccine at local pharmacy or Health Dept. Verbalized acceptance and understanding.  Screening Tests Health Maintenance  Topic Date Due   INFLUENZA VACCINE  02/26/2021   TETANUS/TDAP  04/12/2021 (Originally 07/29/2013)   Zoster Vaccines- Shingrix (1 of 2) 05/14/2021 (Originally 07/21/1994)   Hepatitis C Screening  Completed   PNA vac Low Risk Adult  Completed   HPV VACCINES  Aged Out   COVID-19 Vaccine  Discontinued    Health Maintenance  Health Maintenance Due  Topic Date Due   INFLUENZA VACCINE  02/26/2021    Colorectal cancer screening: No longer required.   Lung Cancer Screening: (Low Dose CT Chest recommended if Age 29-80 years, 30 pack-year currently smoking OR have quit w/in 15years.) does not qualify.  Additional Screening:  Hepatitis C Screening: does qualify; Completed 10/11/2019  Vision Screening:  Recommended annual ophthalmology exams for early detection of glaucoma and other disorders of the eye. Is the patient up to date with their annual eye exam?  Yes  Who is the provider or what is the name of the office in which the patient attends annual eye exams? Dr Marin Comment in [place] If pt is not established with a provider, would they like to be referred to a provider to establish care? No .   Dental Screening: Recommended annual dental exams for proper oral hygiene  Community Resource Referral / Chronic Care Management: CRR required this visit?  No   CCM required this visit?  No      Plan:     I have personally reviewed and noted the following in the patient's chart:   Medical and social history Use of alcohol, tobacco or illicit drugs  Current medications and supplements including opioid prescriptions. Patient is not currently taking opioid prescriptions. Functional ability and status Nutritional status Physical activity Advanced directives List of other physicians Hospitalizations, surgeries, and ER visits in previous 12 months Vitals Screenings to include cognitive, depression, and falls Referrals and appointments  In addition, I have reviewed and discussed with patient certain preventive protocols, quality metrics, and best practice recommendations. A written personalized care plan for preventive services as well as general preventive health recommendations were provided to patient.     Sandrea Hammond, LPN   [id number]   Nurse Notes: None

## 2021-04-05 ENCOUNTER — Ambulatory Visit: Payer: Medicare HMO | Admitting: Family Medicine

## 2021-04-09 ENCOUNTER — Ambulatory Visit: Payer: Medicare HMO | Admitting: Pulmonary Disease

## 2021-04-09 ENCOUNTER — Other Ambulatory Visit: Payer: Self-pay

## 2021-04-09 ENCOUNTER — Encounter: Payer: Self-pay | Admitting: Family Medicine

## 2021-04-09 ENCOUNTER — Encounter: Payer: Self-pay | Admitting: Pulmonary Disease

## 2021-04-09 ENCOUNTER — Other Ambulatory Visit: Payer: Self-pay | Admitting: Family Medicine

## 2021-04-09 VITALS — BP 142/78 | HR 69 | Temp 97.8°F | Ht 67.0 in | Wt 240.4 lb

## 2021-04-09 DIAGNOSIS — R600 Localized edema: Secondary | ICD-10-CM | POA: Diagnosis not present

## 2021-04-09 DIAGNOSIS — I82401 Acute embolism and thrombosis of unspecified deep veins of right lower extremity: Secondary | ICD-10-CM | POA: Diagnosis not present

## 2021-04-09 DIAGNOSIS — I2609 Other pulmonary embolism with acute cor pulmonale: Secondary | ICD-10-CM | POA: Diagnosis not present

## 2021-04-09 DIAGNOSIS — I2699 Other pulmonary embolism without acute cor pulmonale: Secondary | ICD-10-CM

## 2021-04-09 DIAGNOSIS — I82402 Acute embolism and thrombosis of unspecified deep veins of left lower extremity: Secondary | ICD-10-CM | POA: Diagnosis not present

## 2021-04-09 LAB — BASIC METABOLIC PANEL
BUN: 8 mg/dL (ref 6–23)
CO2: 31 mEq/L (ref 19–32)
Calcium: 8.7 mg/dL (ref 8.4–10.5)
Chloride: 101 mEq/L (ref 96–112)
Creatinine, Ser: 0.85 mg/dL (ref 0.40–1.50)
GFR: 84.28 mL/min (ref >60.00)
Glucose, Bld: 115 mg/dL — ABNORMAL HIGH (ref 70–99)
Potassium: 4 mEq/L (ref 3.5–5.1)
Sodium: 141 mEq/L (ref 135–145)

## 2021-04-09 MED ORDER — FUROSEMIDE 20 MG PO TABS: 20.0000 mg | ORAL_TABLET | Freq: Two times a day (BID) | ORAL | 0 refills | Status: DC

## 2021-04-09 MED ORDER — APIXABAN 5 MG PO TABS: 5.0000 mg | ORAL_TABLET | Freq: Two times a day (BID) | ORAL | 3 refills | Status: DC

## 2021-04-09 NOTE — Progress Notes (Signed)
  Subjective:   PATIENT ID: [name] [name] GENDER: male DOB: 06/09/1946, MRN: [id number]   HPI  Chief Complaint  Patient presents with   Follow-up    Hospital f/u for blood clot systemic    Reason for Visit: Follow-up   [name] is a 75 year old male with hx pulmonary embolism who presents for follow-up.  He was previously seen by me for pulmonary embolism and bilateral DVTs presumed to be provoked by COVID-19 in October 2021. He completed 6 months of anticoagulation in April 2022. Unfortunately he was readmitted last month in July 2022 for acute submassive PE that was unprovoked. Pulmonary inpatient note by Dr. Desai on 02/23/21 reviewed. He was treated with heparin and transitioned to Eliquis. No oxygen requirement. Denies bleeding issues. Minimal bruising.  Has shortness of breath with exertion. He does not ambulate at all at home, stating he is very sedentary.   Past Medical History:  Diagnosis Date   Colon polyp    GERD (gastroesophageal reflux disease)    Hemorrhoid    Hyperplasia of prostate    Hypertension    Other and unspecified hyperlipidemia    Other testicular hypofunction    Pneumonia    Pulmonary embolism (HCC)    Sinus bradycardia      Outpatient Medications Prior to Visit  Medication Sig Dispense Refill   apixaban (ELIQUIS) 5 MG TABS tablet Take 1 tablet (5 mg total) by mouth 2 (two) times daily. 180 tablet 0   furosemide (LASIX) 20 MG tablet Take 1 tablet (20 mg total) by mouth daily. 14 tablet 0   tadalafil (CIALIS) 5 MG tablet Take 5 mg by mouth daily as needed for erectile dysfunction.     vitamin C (ASCORBIC ACID) 500 MG tablet Take 500 mg by mouth daily.     No facility-administered medications prior to visit.    Review of Systems  Constitutional:  Negative for chills, diaphoresis, fever, malaise/fatigue and weight loss.  HENT:  Negative for congestion.   Respiratory:  Positive for shortness of breath. Negative for cough, hemoptysis,  sputum production and wheezing.   Cardiovascular:  Negative for chest pain, palpitations and leg swelling.    Objective:   Vitals:   04/09/21 0938  BP: (!) 142/78  Pulse: 69  Temp: 97.8 F (36.6 C)  TempSrc: Oral  SpO2: 96%  Weight: 240 lb 6.4 oz (109 kg)  Height: 5' 7" (1.702 m)   SpO2: 96 % O2 Device: None (Room air)  Physical Exam: General: Well-appearing, no acute distress HENT: [place], AT Eyes: EOMI, no scleral icterus Respiratory: Clear to auscultation bilaterally.  No crackles, wheezing or rales Cardiovascular: RRR, -M/R/G, no JVD Extremities:Bilateral lower extremity,-tenderness Neuro: AAO x4, CNII-XII grossly intact Psych: Normal mood, normal affect   Data Reviewed:  Imaging: CTA 05/31/20 - Acute PE bilaterally. GGO R>L suggestive of pulmonary infarct LE Dopplers 06/01/20 - Left DVT involving popliteal, posterior tibial and peroneal veins  Bilateral LE Dopplers 02/23/21  - Acute deep vein thrombosis involving the right common femoral vein, SF junction, right popliteal vein, right posterior tibial veins, and right peroneal veins. - Partial thrombus noted in the external iliac through to, at least, the mid common iliac. Unable to visualize the distal common iliac or the IVC secondary to bowel gas.  PFT: None on file  Labs: BMET    Component Value Date/Time   NA 141 04/22/2021 0702   NA 143 03/01/2021 1155   K 3.1 (L) 04/22/2021 0702     CL 94 (L) 04/22/2021 0702   CO2 35 (H) 04/22/2021 0702   GLUCOSE 109 (H) 04/22/2021 0702   BUN 11 04/22/2021 0702   BUN 13 03/01/2021 1155   CREATININE 1.09 04/22/2021 0702   CREATININE 0.89 12/14/2012 0903   CALCIUM 8.8 (L) 04/22/2021 0702   EGFR 90 03/01/2021 1155   GFRNONAA >60 04/22/2021 0702   GFRNONAA 88 12/14/2012 0903   Appropriate BUN/CR. Ok for increased diuresis    Assessment & Plan:   Discussion: 75 year old male with recurrent pulmonary embolism/DVT. Initially though secondary to COVID-19 however his  sedentary lifestyle poses significant risk for recurrence. Given that he has had two life threatening hospitalizations for pulmonary embolism, will need lifelong anticoagulation.  Unprovoked pulmonary embolism Acute LLE DVT --Continue Eliquis 5 mg twice a day indefinitely. REFILL  --SCHEDULE complete echocardiogram by 05/26/21 --If echocardiogram abnormal, will refer to Cardiology  Lower extremity edema --START lasix 20 mg at 8 am and at 2 pm for three days total --After this resume, lasix 40mg once a day --Obtain BMET to check kidney function. Reviewed. Continue diuresis plan as stated  Health Maintenance Immunization History  Administered Date(s) Administered   Pneumococcal Conjugate-13 05/03/2013   Pneumococcal Polysaccharide-23 07/29/2009   Td 07/30/2003   CT Lung Screen - not indicated  Orders Placed This Encounter  Procedures   Basic Metabolic Panel (BMET)    Standing Status:   Future    Number of Occurrences:   1    Standing Expiration Date:   04/09/2022   ECHOCARDIOGRAM COMPLETE    Standing Status:   Future    Standing Expiration Date:   04/09/2022    Scheduling Instructions:     Please schedule before 05/25/2021    Order Specific Question:   Where should this test be performed    Answer:   [facility] ([address])    Order Specific Question:   Does the patient weigh less than or greater than 250 lbs?    Answer:   Patient weighs greater than 250 lbs    Order Specific Question:   Perflutren DEFINITY (image enhancing agent) should be administered unless hypersensitivity or allergy exist    Answer:   Administer Perflutren    Order Specific Question:   Reason for exam-Echo    Answer:   Other-Full Diagnosis List    Order Specific Question:   Full ICD-10/Reason for Exam    Answer:   Lower extremity edema [218969]   Meds ordered this encounter  Medications   apixaban (ELIQUIS) 5 MG TABS tablet    Sig: Take 1 tablet (5 mg total) by mouth 2 (two) times daily.     Dispense:  180 tablet    Refill:  3   DISCONTD: furosemide (LASIX) 20 MG tablet    Sig: Take 1 tablet (20 mg total) by mouth 2 (two) times daily for 3 days. 8 am and 4 pm    Dispense:  6 tablet    Refill:  0    Return in about 2 months (around 06/09/2021).  I have spent a total time of 36-minutes on the day of the appointment reviewing prior documentation, coordinating care and discussing medical diagnosis and plan with the patient/family. Past medical history, allergies, medications were reviewed. Pertinent imaging, labs and tests included in this note have been reviewed and interpreted independently by me.   Chi Jane Ellison, MD [facility] 04/09/2021 9:48 AM  Office Number [phone]   

## 2021-04-09 NOTE — Patient Instructions (Addendum)
Unprovoked pulmonary embolism Acute LLE DVT --Continue Eliquis 5 mg twice a day indefinitely. REFILL  --SCHEDULE complete echocardiogram by 05/26/21 --If echocardiogram abnormal, will refer to Cardiology  Lower extremity edema --START lasix 20 mg at 8 am and at 2 pm for three days total --After this resume, lasix '40mg'$  once a day --Obtain BMET to check kidney function  Follow-up with me 2 months

## 2021-04-12 ENCOUNTER — Ambulatory Visit: Payer: Medicare HMO | Admitting: Family Medicine

## 2021-04-16 ENCOUNTER — Ambulatory Visit (INDEPENDENT_AMBULATORY_CARE_PROVIDER_SITE_OTHER): Payer: Medicare HMO | Admitting: Family Medicine

## 2021-04-16 ENCOUNTER — Encounter: Payer: Self-pay | Admitting: Family Medicine

## 2021-04-16 ENCOUNTER — Other Ambulatory Visit: Payer: Self-pay

## 2021-04-16 VITALS — BP 126/78 | HR 74 | Ht 67.0 in | Wt 242.0 lb

## 2021-04-16 DIAGNOSIS — R609 Edema, unspecified: Secondary | ICD-10-CM

## 2021-04-16 DIAGNOSIS — Z86711 Personal history of pulmonary embolism: Secondary | ICD-10-CM | POA: Diagnosis not present

## 2021-04-16 DIAGNOSIS — Z86718 Personal history of other venous thrombosis and embolism: Secondary | ICD-10-CM | POA: Diagnosis not present

## 2021-04-16 DIAGNOSIS — I2699 Other pulmonary embolism without acute cor pulmonale: Secondary | ICD-10-CM | POA: Diagnosis not present

## 2021-04-16 MED ORDER — FUROSEMIDE 20 MG PO TABS: 40.0000 mg | ORAL_TABLET | Freq: Every day | ORAL | 0 refills | Status: DC

## 2021-04-16 MED ORDER — GABAPENTIN 100 MG PO CAPS: 100.0000 mg | ORAL_CAPSULE | Freq: Every day | ORAL | 5 refills | Status: DC

## 2021-04-16 NOTE — Progress Notes (Signed)
BP 126/78   Pulse 74   Ht _0  (1.702 m)   Wt 242 lb (109.8 kg)   SpO2 94%   BMI 37.90 kg/m    Subjective:   Patient ID: [name] [name], male    DOB: 1946/02/26, 75 y.o.   MRN: [id number]  HPI: [name] [name] is a 75 y.o. male presenting on 04/16/2021 for Leg Swelling (RLE, and painful)   HPI Patient is coming in for follow-up for DVT and PE again.  He was placed on Eliquis again.  He says having a lot of swelling in both of his legs but the right leg was worse than its a lot of pain in the right leg is having difficulty walking because of it.  Patient denies any chest pain or shortness of breath currently today.  He said he was post increase his diuretic for few days but did not because he forgot.  Relevant past medical, surgical, family and social history reviewed and updated as indicated. Interim medical history since our last visit reviewed. Allergies and medications reviewed and updated.  Review of Systems  Constitutional:  Negative for chills and fever.  Eyes:  Negative for visual disturbance.  Respiratory:  Negative for chest tightness, shortness of breath and wheezing.   Cardiovascular:  Positive for leg swelling. Negative for chest pain and palpitations.  Musculoskeletal:  Negative for back pain and gait problem.  Skin:  Negative for rash.  Neurological:  Negative for dizziness, weakness and light-headedness.  All other systems reviewed and are negative.  Per HPI unless specifically indicated above   Allergies as of 04/16/2021       Reactions   Cialis [tadalafil] Other (See Comments)   Not allergic 02/23/21   Viagra [sildenafil Citrate] Other (See Comments)   heartburn        Medication List        Accurate as of April 16, 2021  3:06 PM. If you have any questions, ask your nurse or doctor.          STOP taking these medications    amLODipine 5 MG tablet Commonly known as: NORVASC Stopped by: Fransisca Kaufmann Maritza Goldsborough, MD   benazepril 10 MG  tablet Commonly known as: LOTENSIN Stopped by: Fransisca Kaufmann Tahsin Benyo, MD   pravastatin 40 MG tablet Commonly known as: PRAVACHOL Stopped by: Fransisca Kaufmann Casper Pagliuca, MD       TAKE these medications    apixaban 5 MG Tabs tablet Commonly known as: ELIQUIS Take 1 tablet (5 mg total) by mouth 2 (two) times daily.   furosemide 20 MG tablet Commonly known as: Lasix Take 2 tablets (40 mg total) by mouth daily for 5 days. 8 am and 4 pm What changed:  how much to take when to take this Another medication with the same name was removed. Continue taking this medication, and follow the directions you see here. Changed by: Fransisca Kaufmann Ikeisha Blumberg, MD   gabapentin 100 MG capsule Commonly known as: NEURONTIN Take 1 capsule (100 mg total) by mouth at bedtime. Started by: Fransisca Kaufmann Alorah Mcree, MD   omeprazole 20 MG capsule Commonly known as: PRILOSEC TAKE 1 CAPSULE (20 MG TOTAL) BY MOUTH DAILY.   tadalafil 5 MG tablet Commonly known as: CIALIS Take 5 mg by mouth daily as needed for erectile dysfunction.   vitamin C 500 MG tablet Commonly known as: ASCORBIC ACID Take 500 mg by mouth daily.         Objective:   BP 126/78  Pulse 74   Ht _0  (1.702 m)   Wt 242 lb (109.8 kg)   SpO2 94%   BMI 37.90 kg/m   Wt Readings from Last 3 Encounters:  04/16/21 242 lb (109.8 kg)  04/09/21 240 lb 6.4 oz (109 kg)  03/02/21 242 lb (109.8 kg)    Physical Exam Vitals and nursing note reviewed.  Constitutional:      General: He is not in acute distress.    Appearance: He is well-developed. He is not diaphoretic.  Eyes:     General: No scleral icterus.    Conjunctiva/sclera: Conjunctivae normal.  Neck:     Thyroid: No thyromegaly.  Cardiovascular:     Rate and Rhythm: Normal rate and regular rhythm.     Heart sounds: Normal heart sounds. No murmur heard. Pulmonary:     Effort: Pulmonary effort is normal. No respiratory distress.     Breath sounds: Normal breath sounds. No wheezing.   Musculoskeletal:        General: Swelling present. Normal range of motion.     Cervical back: Neck supple.  Lymphadenopathy:     Cervical: No cervical adenopathy.  Skin:    General: Skin is warm and dry.     Findings: No rash.  Neurological:     Mental Status: He is alert and oriented to person, place, and time.     Coordination: Coordination normal.  Psychiatric:        Behavior: Behavior normal.   Assessment & Plan:   Problem List Items Addressed This Visit       Cardiovascular and Mediastinum   Recurrent pulmonary emboli (HCC)   Relevant Medications   furosemide (LASIX) 20 MG tablet     Other   History of pulmonary embolus (PE)   History of DVT (deep vein thrombosis)   Relevant Orders   CBC with Differential/Platelet   CMP14+EGFR   Other Visit Diagnoses     Peripheral edema    -  Primary   Relevant Medications   furosemide (LASIX) 20 MG tablet   Other Relevant Orders   CBC with Differential/Platelet   CMP14+EGFR       Increase furosemide to 40 mg d p.o. aily for 5 days and sent gabapentin to try and help with the pain.   Follow up plan: Return if symptoms worsen or fail to improve.  Counseling provided for all of the vaccine components Orders Placed This Encounter  Procedures   CBC with Differential/Platelet   [place] Olinda Nola, MD [facility] 04/16/2021, 3:06 PM

## 2021-04-21 ENCOUNTER — Observation Stay (HOSPITAL_COMMUNITY): Payer: Medicare HMO

## 2021-04-21 ENCOUNTER — Encounter (HOSPITAL_BASED_OUTPATIENT_CLINIC_OR_DEPARTMENT_OTHER): Payer: Self-pay | Admitting: Emergency Medicine

## 2021-04-21 ENCOUNTER — Emergency Department (HOSPITAL_BASED_OUTPATIENT_CLINIC_OR_DEPARTMENT_OTHER): Payer: Medicare HMO

## 2021-04-21 ENCOUNTER — Other Ambulatory Visit: Payer: Self-pay

## 2021-04-21 ENCOUNTER — Observation Stay (HOSPITAL_BASED_OUTPATIENT_CLINIC_OR_DEPARTMENT_OTHER)
Admission: EM | Admit: 2021-04-21 | Discharge: 2021-04-22 | Disposition: A | Discharge status: Home / Self Care | Payer: Medicare HMO | Attending: Internal Medicine | Admitting: Internal Medicine

## 2021-04-21 DIAGNOSIS — K760 Fatty (change of) liver, not elsewhere classified: Secondary | ICD-10-CM | POA: Insufficient documentation

## 2021-04-21 DIAGNOSIS — R109 Unspecified abdominal pain: Secondary | ICD-10-CM

## 2021-04-21 DIAGNOSIS — I10 Essential (primary) hypertension: Secondary | ICD-10-CM | POA: Diagnosis present

## 2021-04-21 DIAGNOSIS — I959 Hypotension, unspecified: Secondary | ICD-10-CM | POA: Diagnosis not present

## 2021-04-21 DIAGNOSIS — R7989 Other specified abnormal findings of blood chemistry: Secondary | ICD-10-CM

## 2021-04-21 DIAGNOSIS — E1159 Type 2 diabetes mellitus with other circulatory complications: Secondary | ICD-10-CM | POA: Diagnosis present

## 2021-04-21 DIAGNOSIS — E876 Hypokalemia: Principal | ICD-10-CM | POA: Diagnosis present

## 2021-04-21 DIAGNOSIS — R531 Weakness: Secondary | ICD-10-CM | POA: Diagnosis not present

## 2021-04-21 DIAGNOSIS — Z86718 Personal history of other venous thrombosis and embolism: Secondary | ICD-10-CM

## 2021-04-21 DIAGNOSIS — R748 Abnormal levels of other serum enzymes: Secondary | ICD-10-CM | POA: Diagnosis not present

## 2021-04-21 DIAGNOSIS — R0602 Shortness of breath: Secondary | ICD-10-CM | POA: Insufficient documentation

## 2021-04-21 DIAGNOSIS — Z20822 Contact with and (suspected) exposure to covid-19: Secondary | ICD-10-CM | POA: Diagnosis not present

## 2021-04-21 DIAGNOSIS — Z7901 Long term (current) use of anticoagulants: Secondary | ICD-10-CM | POA: Diagnosis not present

## 2021-04-21 DIAGNOSIS — Z86711 Personal history of pulmonary embolism: Secondary | ICD-10-CM | POA: Diagnosis not present

## 2021-04-21 DIAGNOSIS — Z87891 Personal history of nicotine dependence: Secondary | ICD-10-CM | POA: Insufficient documentation

## 2021-04-21 DIAGNOSIS — G8190 Hemiplegia, unspecified affecting unspecified side: Secondary | ICD-10-CM | POA: Diagnosis not present

## 2021-04-21 DIAGNOSIS — R110 Nausea: Secondary | ICD-10-CM | POA: Diagnosis not present

## 2021-04-21 DIAGNOSIS — R778 Other specified abnormalities of plasma proteins: Secondary | ICD-10-CM

## 2021-04-21 DIAGNOSIS — R0902 Hypoxemia: Secondary | ICD-10-CM | POA: Diagnosis not present

## 2021-04-21 DIAGNOSIS — R0689 Other abnormalities of breathing: Secondary | ICD-10-CM | POA: Diagnosis not present

## 2021-04-21 DIAGNOSIS — Z79899 Other long term (current) drug therapy: Secondary | ICD-10-CM | POA: Insufficient documentation

## 2021-04-21 DIAGNOSIS — R17 Unspecified jaundice: Secondary | ICD-10-CM

## 2021-04-21 DIAGNOSIS — R1110 Vomiting, unspecified: Secondary | ICD-10-CM | POA: Diagnosis not present

## 2021-04-21 DIAGNOSIS — R140 Abdominal distension (gaseous): Secondary | ICD-10-CM | POA: Diagnosis not present

## 2021-04-21 LAB — CBC WITH DIFFERENTIAL/PLATELET
Abs Immature Granulocytes: 0.04 10*3/uL (ref 0.00–0.07)
Basophils Absolute: 0 10*3/uL (ref 0.0–0.1)
Basophils Relative: 0 %
Eosinophils Absolute: 0 10*3/uL (ref 0.0–0.5)
Eosinophils Relative: 0 %
HCT: 42.9 % (ref 39.0–52.0)
Hemoglobin: 15.5 g/dL (ref 13.0–17.0)
Immature Granulocytes: 0 %
Lymphocytes Relative: 27 %
Lymphs Abs: 2.8 10*3/uL (ref 0.7–4.0)
MCH: 34.3 pg — ABNORMAL HIGH (ref 26.0–34.0)
MCHC: 36.1 g/dL — ABNORMAL HIGH (ref 30.0–36.0)
MCV: 94.9 fL (ref 80.0–100.0)
Monocytes Absolute: 0.7 10*3/uL (ref 0.1–1.0)
Monocytes Relative: 7 %
Neutro Abs: 6.5 10*3/uL (ref 1.7–7.7)
Neutrophils Relative %: 66 %
Platelets: 93 10*3/uL — ABNORMAL LOW (ref 150–400)
RBC: 4.52 MIL/uL (ref 4.22–5.81)
RDW: 12.3 % (ref 11.5–15.5)
WBC: 10.1 10*3/uL (ref 4.0–10.5)
nRBC: 0 % (ref 0.0–0.2)

## 2021-04-21 LAB — TROPONIN I (HIGH SENSITIVITY)
Troponin I (High Sensitivity): 30 ng/L — ABNORMAL HIGH (ref <18)
Troponin I (High Sensitivity): 28 ng/L — ABNORMAL HIGH (ref <18)
Troponin I (High Sensitivity): 31 ng/L — ABNORMAL HIGH (ref <18)
Troponin I (High Sensitivity): 30 ng/L — ABNORMAL HIGH (ref <18)

## 2021-04-21 LAB — COMPREHENSIVE METABOLIC PANEL
ALT: 42 U/L (ref 0–44)
AST: 67 U/L — ABNORMAL HIGH (ref 15–41)
Albumin: 3.9 g/dL (ref 3.5–5.0)
Alkaline Phosphatase: 40 U/L (ref 38–126)
Anion gap: 18 — ABNORMAL HIGH (ref 5–15)
BUN: 11 mg/dL (ref 8–23)
CO2: 31 mmol/L (ref 22–32)
Calcium: 9.4 mg/dL (ref 8.9–10.3)
Chloride: 89 mmol/L — ABNORMAL LOW (ref 98–111)
Creatinine, Ser: 0.92 mg/dL (ref 0.61–1.24)
GFR, Estimated: >60 mL/min (ref >60)
Glucose, Bld: 159 mg/dL — ABNORMAL HIGH (ref 70–99)
Potassium: 2.8 mmol/L — ABNORMAL LOW (ref 3.5–5.1)
Sodium: 138 mmol/L (ref 135–145)
Total Bilirubin: 2.1 mg/dL — ABNORMAL HIGH (ref 0.3–1.2)
Total Protein: 6.4 g/dL — ABNORMAL LOW (ref 6.5–8.1)

## 2021-04-21 LAB — BRAIN NATRIURETIC PEPTIDE: B Natriuretic Peptide: 67.9 pg/mL (ref 0.0–100.0)

## 2021-04-21 LAB — URINALYSIS, ROUTINE W REFLEX MICROSCOPIC
Bilirubin Urine: NEGATIVE
Glucose, UA: NEGATIVE mg/dL
Hgb urine dipstick: NEGATIVE
Ketones, ur: 15 mg/dL — AB
Leukocytes,Ua: NEGATIVE
Nitrite: NEGATIVE
Protein, ur: NEGATIVE mg/dL
Specific Gravity, Urine: 1.007 (ref 1.005–1.030)
pH: 5.5 (ref 5.0–8.0)

## 2021-04-21 LAB — LIPASE, BLOOD: Lipase: 16 U/L (ref 11–51)

## 2021-04-21 LAB — RESP PANEL BY RT-PCR (FLU A&B, COVID) ARPGX2
Influenza A by PCR: NEGATIVE
Influenza B by PCR: NEGATIVE
SARS Coronavirus 2 by RT PCR: NEGATIVE

## 2021-04-21 LAB — POTASSIUM: Potassium: 3.1 mmol/L — ABNORMAL LOW (ref 3.5–5.1)

## 2021-04-21 LAB — MAGNESIUM: Magnesium: 1.5 mg/dL — ABNORMAL LOW (ref 1.7–2.4)

## 2021-04-21 MED ORDER — ADULT MULTIVITAMIN W/MINERALS CH
1.0000 | ORAL_TABLET | Freq: Every day | ORAL | Status: DC
  Administered 2021-04-21 – 2021-04-22 (×2): 1 via ORAL
  Filled 2021-04-21 (×2): qty 1

## 2021-04-21 MED ORDER — FOLIC ACID 1 MG PO TABS
1.0000 mg | ORAL_TABLET | Freq: Every day | ORAL | Status: DC
  Administered 2021-04-21 – 2021-04-22 (×2): 1 mg via ORAL
  Filled 2021-04-21 (×2): qty 1

## 2021-04-21 MED ORDER — THIAMINE HCL 100 MG/ML IJ SOLN
100.0000 mg | Freq: Every day | INTRAMUSCULAR | Status: DC
  Filled 2021-04-21: qty 2

## 2021-04-21 MED ORDER — POTASSIUM CHLORIDE 10 MEQ/100ML IV SOLN
10.0000 meq | INTRAVENOUS | Status: AC
  Administered 2021-04-21 (×2): 10 meq via INTRAVENOUS
  Filled 2021-04-21 (×2): qty 100

## 2021-04-21 MED ORDER — CHLORDIAZEPOXIDE HCL 5 MG PO CAPS
10.0000 mg | ORAL_CAPSULE | Freq: Three times a day (TID) | ORAL | Status: DC
  Administered 2021-04-21 – 2021-04-22 (×2): 10 mg via ORAL
  Filled 2021-04-21 (×2): qty 2

## 2021-04-21 MED ORDER — ACETAMINOPHEN 325 MG PO TABS: 650.0000 mg | ORAL_TABLET | Freq: Four times a day (QID) | ORAL | Status: DC | PRN

## 2021-04-21 MED ORDER — ONDANSETRON HCL 4 MG/2ML IJ SOLN
4.0000 mg | Freq: Once | INTRAMUSCULAR | Status: AC
  Administered 2021-04-21: 4 mg via INTRAVENOUS
  Filled 2021-04-21: qty 2

## 2021-04-21 MED ORDER — LORAZEPAM 1 MG PO TABS: 1.0000 mg | ORAL_TABLET | ORAL | Status: DC | PRN

## 2021-04-21 MED ORDER — POLYETHYLENE GLYCOL 3350 17 G PO PACK: 17.0000 g | PACK | Freq: Every day | ORAL | Status: DC | PRN

## 2021-04-21 MED ORDER — GABAPENTIN 100 MG PO CAPS
100.0000 mg | ORAL_CAPSULE | Freq: Every day | ORAL | Status: DC
  Administered 2021-04-21: 100 mg via ORAL
  Filled 2021-04-21: qty 1

## 2021-04-21 MED ORDER — LORAZEPAM 2 MG/ML IJ SOLN: 1.0000 mg | INTRAMUSCULAR | Status: DC | PRN

## 2021-04-21 MED ORDER — SODIUM CHLORIDE 0.9% FLUSH: 3.0000 mL | INTRAVENOUS | Status: DC | PRN

## 2021-04-21 MED ORDER — SODIUM CHLORIDE 0.9% FLUSH
3.0000 mL | Freq: Two times a day (BID) | INTRAVENOUS | Status: DC
  Administered 2021-04-21 – 2021-04-22 (×2): 3 mL via INTRAVENOUS

## 2021-04-21 MED ORDER — THIAMINE HCL 100 MG PO TABS
100.0000 mg | ORAL_TABLET | Freq: Every day | ORAL | Status: DC
  Administered 2021-04-21 – 2021-04-22 (×2): 100 mg via ORAL
  Filled 2021-04-21 (×2): qty 1

## 2021-04-21 MED ORDER — SODIUM CHLORIDE 0.9 % IV BOLUS
500.0000 mL | Freq: Once | INTRAVENOUS | Status: AC
  Administered 2021-04-21: 500 mL via INTRAVENOUS

## 2021-04-21 MED ORDER — SODIUM CHLORIDE 0.9 % IV SOLN: 250.0000 mL | INTRAVENOUS | Status: DC | PRN

## 2021-04-21 MED ORDER — POTASSIUM CHLORIDE CRYS ER 20 MEQ PO TBCR
40.0000 meq | EXTENDED_RELEASE_TABLET | Freq: Once | ORAL | Status: AC
  Administered 2021-04-21: 40 meq via ORAL
  Filled 2021-04-21: qty 2

## 2021-04-21 MED ORDER — IOHEXOL 350 MG/ML SOLN
75.0000 mL | Freq: Once | INTRAVENOUS | Status: AC | PRN
  Administered 2021-04-21: 75 mL via INTRAVENOUS

## 2021-04-21 MED ORDER — ACETAMINOPHEN 650 MG RE SUPP: 650.0000 mg | Freq: Four times a day (QID) | RECTAL | Status: DC | PRN

## 2021-04-21 MED ORDER — APIXABAN 5 MG PO TABS
5.0000 mg | ORAL_TABLET | Freq: Two times a day (BID) | ORAL | Status: DC
  Administered 2021-04-21 – 2021-04-22 (×2): 5 mg via ORAL
  Filled 2021-04-21 (×2): qty 1

## 2021-04-21 NOTE — ED Provider Notes (Signed)
[facility] EMERGENCY DEPT Provider Note   CSN: [id number] Arrival date & time: 04/21/21  1123     History Chief Complaint  Patient presents with   Weakness    [name] [name] is a 75 y.o. male history of obesity, hypertension, hyperlipidemia, Covid illness c/b DVT (RLE) and PE on Eliquis (July 2022), presenting to the ED with abdominal bloating and feeling unwell.  Patient ports symptoms began yesterday evening into this morning.  He says he feels generally weak.  He describes significant belching.  He denies vomiting.  He reports last bowel movement was 3 to 4 days ago, which he says is normal for him.  He denies any abdominal pain.  Denies chest pain or pressure.  He called EMS because he felt very weak today.  He feels that his arms were trembling as well.  He feels he had chills and rigors.  He reports he has been compliant with all his medications including his Eliquis.  His daughter at bedside reports concerns for his acute weakness, says he does not like this typically.  He has also been belching which she says is unusual.   Prior medical history reviewed including hospital discharge summary from February 25, 2021, which time he had been admitted for submassive pulmonary embolism with evidence of right heart strain, transition from heparin to Eliquis.  He was also noted to have an acute right lower extremity DVT and continued on conservative management with Eliquis per discussion with vascular surgery.  He will need to be a lifelong candidate on blood thinners.  At the time in the hospital troponins were mildly elevated in the 100s range.  Echocardiogram on July 29 showed an EF of 60 to 94%, grade 1 diastolic dysfunction, McConnell sign present with akinesis of the right ventricular wall.   HPI     Past Medical History:  Diagnosis Date   Colon polyp    GERD (gastroesophageal reflux disease)    Hemorrhoid    Hyperplasia of prostate    Hypertension    Other and  unspecified hyperlipidemia    Other testicular hypofunction    Pneumonia    Pulmonary embolism (HCC)    Sinus bradycardia     Patient Active Problem List   Diagnosis Date Noted   Deep vein thrombosis (DVT) of left lower extremity ([place]) 04/09/2021   Benign essential hypertension in obstetric context 03/02/2021   Recurrent pulmonary emboli ([place]) 02/23/2021   Class 2 obesity due to excess calories with body mass index (BMI) of 36.0 to 36.9 in adult 02/23/2021   Osteoarthritis of left knee 01/21/2021   History of DVT (deep vein thrombosis) 08/15/2020   History of pulmonary embolus (PE) 05/31/2020   Osteoarthritis of right knee 05/05/2019   Aortic atherosclerosis ([place]) 03/07/2017   B12 deficiency 12/31/2015   Hyperlipidemia 07/01/2014   Erectile dysfunction 02/28/2014   Vitamin D deficiency 12/14/2012   Testosterone deficiency    Hypertension, essential    Sinus bradycardia    GERD (gastroesophageal reflux disease)    Colon polyp history    BPH (benign prostatic hyperplasia)     Past Surgical History:  Procedure Laterality Date   APPENDECTOMY     HAND SURGERY Right        Family History  Problem Relation Age of Onset   Heart attack Mother    Stroke Mother    Diabetes Father    Diabetes Daughter     Social History   Tobacco Use   Smoking status: Former  Types: Cigars    Quit date: 2015    Years since quitting: 7.7   Smokeless tobacco: Current    Types: Chew   Tobacco comments:    only 1 cigar on occassion  Vaping Use   Vaping Use: Never used  Substance Use Topics   Alcohol use: Yes    Comment: daily, up to 1/2 pint   Drug use: No    Home Medications Prior to Admission medications   Medication Sig Start Date End Date Taking? Authorizing Provider  apixaban (ELIQUIS) 5 MG TABS tablet Take 1 tablet (5 mg total) by mouth 2 (two) times daily. 04/09/21   Margaretha Seeds, MD  furosemide (LASIX) 20 MG tablet Take 2 tablets (40 mg total) by mouth daily for 5  days. 8 am and 4 pm 04/16/21 04/21/21  Dettinger, Fransisca Kaufmann, MD  gabapentin (NEURONTIN) 100 MG capsule Take 1 capsule (100 mg total) by mouth at bedtime. 04/16/21   Dettinger, Fransisca Kaufmann, MD  omeprazole (PRILOSEC) 20 MG capsule TAKE 1 CAPSULE (20 MG TOTAL) BY MOUTH DAILY. 04/10/21   Dettinger, Fransisca Kaufmann, MD  tadalafil (CIALIS) 5 MG tablet Take 5 mg by mouth daily as needed for erectile dysfunction.    [provider]  vitamin C (ASCORBIC ACID) 500 MG tablet Take 500 mg by mouth daily.    [provider]    Allergies    Cialis [tadalafil] and Viagra [sildenafil citrate]  Review of Systems   Review of Systems  Constitutional:  Positive for fatigue. Negative for chills and fever.  HENT:  Negative for ear pain and sore throat.   Eyes:  Negative for pain and visual disturbance.  Respiratory:  Negative for cough and shortness of breath.   Cardiovascular:  Negative for chest pain and palpitations.  Gastrointestinal:  Positive for abdominal distention and nausea. Negative for abdominal pain and vomiting.  Genitourinary:  Negative for dysuria and hematuria.  Musculoskeletal:  Negative for arthralgias and myalgias.  Skin:  Negative for color change and rash.  Neurological:  Negative for syncope and headaches.  All other systems reviewed and are negative.  Physical Exam Updated Vital Signs BP 139/73   Pulse 82   Temp 98.2 F (36.8 C) (Oral)   Resp (!) 23   Ht 5\' 6"  (1.676 m)   Wt 108.9 kg   SpO2 95%   BMI 38.74 kg/m   Physical Exam Constitutional:      General: He is not in acute distress.    Appearance: He is obese.     Comments: Belching on exam  HENT:     Head: Normocephalic and atraumatic.  Eyes:     Conjunctiva/sclera: Conjunctivae normal.     Pupils: Pupils are equal, round, and reactive to light.  Cardiovascular:     Rate and Rhythm: Normal rate and regular rhythm.     Pulses: Normal pulses.  Pulmonary:     Effort: Pulmonary effort is normal. No respiratory  distress.  Abdominal:     General: There is distension.     Tenderness: There is no abdominal tenderness. There is no guarding.  Skin:    General: Skin is warm and dry.  Neurological:     General: No focal deficit present.     Mental Status: He is alert. Mental status is at baseline.  Psychiatric:        Mood and Affect: Mood normal.        Behavior: Behavior normal.    ED Results / Procedures /  Treatments   Labs (all labs ordered are listed, but only abnormal results are displayed) Labs Reviewed  COMPREHENSIVE METABOLIC PANEL - Abnormal; Notable for the following components:      Result Value   Potassium 2.8 (*)    Chloride 89 (*)    Glucose, Bld 159 (*)    Total Protein 6.4 (*)    AST 67 (*)    Total Bilirubin 2.1 (*)    Anion gap 18 (*)    All other components within normal limits  CBC WITH DIFFERENTIAL/PLATELET - Abnormal; Notable for the following components:   MCH 34.3 (*)    MCHC 36.1 (*)    Platelets 93 (*)    All other components within normal limits  URINALYSIS, ROUTINE W REFLEX MICROSCOPIC - Abnormal; Notable for the following components:   Ketones, ur 15 (*)    All other components within normal limits  TROPONIN I (HIGH SENSITIVITY) - Abnormal; Notable for the following components:   Troponin I (High Sensitivity) 30 (*)    All other components within normal limits  TROPONIN I (HIGH SENSITIVITY) - Abnormal; Notable for the following components:   Troponin I (High Sensitivity) 28 (*)    All other components within normal limits  RESP PANEL BY RT-PCR (FLU A&B, COVID) ARPGX2  CULTURE, BLOOD (ROUTINE X 2)  CULTURE, BLOOD (ROUTINE X 2)  LIPASE, BLOOD  MAGNESIUM    EKG EKG Interpretation  Date/Time:  Saturday April 21 2021 12:02:04 EDT Ventricular Rate:  83 PR Interval:  164 QRS Duration: 91 QT Interval:  405 QTC Calculation: 476 R Axis:   -87 Text Interpretation: Sinus rhythm Left anterior fascicular block Borderline prolonged QT interval Confirmed  by Octaviano Glow [phone]) on 04/21/2021 12:06:23 PM  Radiology CT ABDOMEN PELVIS W CONTRAST  Result Date: 04/21/2021 CLINICAL DATA:  Nausea and vomiting. Symptoms began early this morning. EXAM: CT ABDOMEN AND PELVIS WITH CONTRAST TECHNIQUE: Multidetector CT imaging of the abdomen and pelvis was performed using the standard protocol following bolus administration of intravenous contrast. CONTRAST:  105mL OMNIPAQUE IOHEXOL 350 MG/ML SOLN COMPARISON:  08/15/2020 FINDINGS: Lower chest: Eccentric filling defects in the left lower lobe pulmonary artery and peripheral right main pulmonary artery consistent with pulmonary emboli, improved compared to a CTA chest, 02/23/2021. No convincing acute findings at the lung bases. Hepatobiliary: Decreased attenuation of the liver consistent with fatty infiltration. No mass or focal lesion. Normal gallbladder. No bile duct dilation. Pancreas: Unremarkable. No pancreatic ductal dilatation or surrounding inflammatory changes. Spleen: Normal in size without focal abnormality. Adrenals/Urinary Tract: Adrenal glands are unremarkable. Kidneys are normal, without renal calculi, focal lesion, or hydronephrosis. Bladder is unremarkable. Stomach/Bowel: Small sliding hiatal hernia. Stomach otherwise unremarkable. Small bowel and colon are normal in caliber. No wall thickening. No inflammatory changes. Vascular/Lymphatic: Aortic atherosclerosis. No aneurysm. No enlarged lymph nodes. Reproductive: Normal size prostate. Other: No abdominal wall hernia or abnormality. No abdominopelvic ascites. Musculoskeletal: No fracture or acute finding.  No bone lesion. IMPRESSION: 1. Nonocclusive filling defects in the peripheral right main pulmonary artery and left lower lobe pulmonary artery, representing an improvement in pulmonary emboli burden compared to the CTA chest dated 02/23/2021. 2. No acute abnormalities within the abdomen or pelvis. 3. Hepatic steatosis.  Aortic atherosclerosis.  Electronically Signed   By: Lajean Manes M.D.   On: 04/21/2021 14:02    Procedures Procedures   Medications Ordered in ED Medications  potassium chloride 10 mEq in 100 mL IVPB (10 mEq Intravenous New Bag/Given 04/21/21 1450)  sodium chloride 0.9 % bolus 500 mL (500 mLs Intravenous New Bag/Given 04/21/21 1323)  iohexol (OMNIPAQUE) 350 MG/ML injection 75 mL (75 mLs Intravenous Contrast Given 04/21/21 1338)  ondansetron (ZOFRAN) injection 4 mg (4 mg Intravenous Given 04/21/21 1451)    ED Course  I have reviewed the triage vital signs and the nursing notes.  Pertinent labs & imaging results that were available during my care of the patient were reviewed by me and considered in my medical decision making (see chart for details).  Differential diagnosis includes bowel obstruction versus gastritis versus bacteremia vs intraabdominal infection/inflammation vs atypical ACS vs other  Vitals within normal limits on arrival, does not have obvious signs of infection at this time.  Labs ordered. Per medical records reviewed as noted above in history Supplemental history provided by patient's daughter at bedside.  I personally ordered and interpreted his labs.  Is notable for mildly elevated troponin of 30 (lower than his baseline levels, and also consistent with his known history of PE).  He will need a second troponin.  He also has some mild hypokalemia with potassium of 2.7.  Lipase is within normal limits.  T bili very mildly elevated 2.1.  *  CT ordered and reviewed with no focal findings to explain the patient's symptoms.  Zofran given for belching/nausea.  Clinical Course as of 04/21/21 1517  Sat Apr 21, 2021  1441 Patient reports continued fatigue, difficulty ambulating, weakness, belching.  Unclear etiology at this time.  We will admit for hypokalemia, consideration of repeat echocardiogram, and bacteremia rule out [MT]  1511 Delta trop flat, repeat 28. [MT]    Clinical Course User  Index [MT] Ruthy Forry, Carola Rhine, MD   Final Clinical Impression(s) / ED Diagnoses Final diagnoses:  None    Rx / DC Orders ED Discharge Orders     None        Langston Masker Carola Rhine, MD 04/21/21 1517

## 2021-04-21 NOTE — H&P (Signed)
History and Physical    [name] [name] UTM:[id number] DOB: [birth year] DOA: 04/21/2021  PCP: Dettinger, Fransisca Kaufmann, MD   Patient coming from: Home via [place] [facility]  Chief Complaint: weakness  HPI: [name] [name] is a 75 y.o. male with medical history significant for DVT/PE, obesity, GERD who presents for evaluation of weakness. He was admitted in July 2022 for PE and was placed on eliquis at that time.  He reports that this morning he had some abdominal pain and bloating and did not feel well.  He reports generalized weakness and had difficulty getting up and moving around the house like he normally does.  States he also had significant mount of belching which is not normal for him.  He did not have any nausea or vomiting.  He has not had a bowel movement in the last 3 to 4 days which he states is normal for him and he denies any abdominal pain.  He denies having any chest pain or pressure and has not felt any palpitations.  His daughter reports that when she checked on him he like he was having tremors in both hands but he had no fever chills or rigors.  He has been compliant with all of his medications.  He does not have a diagnosis of congestive heart failure but he is on Lasix and the dose was increased by his PCP a few days ago. He chews tobacco.  He drinks a pint of moonshine a day.  Denies illicit drug use He states he has never had alcohol withdrawals. He had an echocardiogram in his last admission on July 29 echocardiogram showed an EF of 55 to 60% with mild diastolic dysfunction and mild LVH with mild akinesis of the right ventricular wall.  ED Course: He is remained hemodynamically stable in the emergency room.  Lab work revealed WBC of 10,100 hemoglobin 15.5 hematocrit 42.9 platelet count 93,000 sodium 138 potassium 2.8 chloride 89 bicarb 31 creatinine 0.92 BUN 11 glucose 159 alkaline phosphatase 40 lipase 16 AST 67 ALT 42 total bilirubin 2.1, troponin 30 initially and repeat troponin 28.   He was given oral and IV potassium in the emergency room and hospitalist service asked to observe overnight  Review of Systems:  General: Reports weakness. Denies fever, chills, weight loss, night sweats. Denies dizziness.  HENT: Denies head trauma, headache, denies change in hearing, tinnitus. Denies nasal congestion. Denies sore throat,   Denies difficulty swallowing Eyes: Denies blurry vision, pain in eye, drainage.  Denies discoloration of eyes. Neck: Denies pain.  Denies swelling.  Denies pain with movement. Cardiovascular: Denies chest pain, palpitations.  Denies edema.  Denies orthopnea Respiratory: Denies shortness of breath, cough.  Denies wheezing.  Denies sputum production Gastrointestinal: Reports abdominal pain.  Denies nausea, vomiting, diarrhea.  Denies melena.  Denies hematemesis. Musculoskeletal: Denies limitation of movement.  Denies deformity or swelling.  Denies pain.  Denies arthralgias or myalgias. Genitourinary: Denies pelvic pain.  Denies urinary frequency or hesitancy.  Denies dysuria.  Skin: Denies rash.  Denies petechiae, purpura, ecchymosis. Neurological:  Denies syncope. Denies seizure activity. Denies paresthesia. Denies slurred speech, drooping face. Denies visual change. Psychiatric: Denies depression, anxiety.  Denies hallucinations.  Past Medical History:  Diagnosis Date   Colon polyp    GERD (gastroesophageal reflux disease)    Hemorrhoid    Hyperplasia of prostate    Hypertension    Other and unspecified hyperlipidemia    Other testicular hypofunction    Pneumonia    Pulmonary embolism (  [place])    Sinus bradycardia     Past Surgical History:  Procedure Laterality Date   APPENDECTOMY     HAND SURGERY Right     Social History  reports that he quit smoking about 7 years ago. His smoking use included cigars. His smokeless tobacco use includes chew. He reports current alcohol use. He reports that he does not use drugs.  Allergies  Allergen  Reactions   Cialis [Tadalafil] Other (See Comments)    Not allergic 02/23/21   Viagra [Sildenafil Citrate] Other (See Comments)    heartburn    Family History  Problem Relation Age of Onset   Heart attack Mother    Stroke Mother    Diabetes Father    Diabetes Daughter      Prior to Admission medications   Medication Sig Start Date End Date Taking? Authorizing Provider  apixaban (ELIQUIS) 5 MG TABS tablet Take 1 tablet (5 mg total) by mouth 2 (two) times daily. 04/09/21   Margaretha Seeds, MD  furosemide (LASIX) 20 MG tablet Take 2 tablets (40 mg total) by mouth daily for 5 days. 8 am and 4 pm 04/16/21 04/21/21  Dettinger, Fransisca Kaufmann, MD  gabapentin (NEURONTIN) 100 MG capsule Take 1 capsule (100 mg total) by mouth at bedtime. 04/16/21   Dettinger, Fransisca Kaufmann, MD  omeprazole (PRILOSEC) 20 MG capsule TAKE 1 CAPSULE (20 MG TOTAL) BY MOUTH DAILY. 04/10/21   Dettinger, Fransisca Kaufmann, MD  tadalafil (CIALIS) 5 MG tablet Take 5 mg by mouth daily as needed for erectile dysfunction.    [provider]  vitamin C (ASCORBIC ACID) 500 MG tablet Take 500 mg by mouth daily.    [provider]    Physical Exam: Vitals:   04/21/21 1258 04/21/21 1358 04/21/21 1500 04/21/21 1845  BP: 128/85 129/79 139/73 (!) 141/85  Pulse: 82 85 82 68  Resp: (!) 23 17 (!) 23 18  Temp:    99.1 F (37.3 C)  TempSrc:    Oral  SpO2: 95% 95% 95% 95%  Weight:    103.7 kg  Height:    5\' 6"  (1.676 m)    Constitutional: NAD, calm, comfortable Vitals:   04/21/21 1258 04/21/21 1358 04/21/21 1500 04/21/21 1845  BP: 128/85 129/79 139/73 (!) 141/85  Pulse: 82 85 82 68  Resp: (!) 23 17 (!) 23 18  Temp:    99.1 F (37.3 C)  TempSrc:    Oral  SpO2: 95% 95% 95% 95%  Weight:    103.7 kg  Height:    5\' 6"  (1.676 m)   General: WDWN, Alert and oriented x3.  Eyes: EOMI, PERRL, conjunctivae normal.  Sclera nonicteric HENT:  [place]/AT, external ears normal.  Nares patent without epistasis.  Mucous membranes are moist.  Posterior pharynx clear.  Neck: Soft, normal range of motion, supple, no masses,Trachea midline Respiratory: clear to auscultation bilaterally, no wheezing, no crackles. Normal respiratory effort. No accessory muscle use.  Cardiovascular: Regular rate and rhythm, no murmurs / rubs / gallops. Mild lower extremity edema. 1+ pedal pulses. Abdomen: Soft, no tenderness, nondistended, no rebound or guarding.  No masses palpated. Bowel sounds normoactive Musculoskeletal: FROM. no cyanosis. No joint deformity upper and lower extremities. Normal muscle tone.  Skin: Warm, dry, intact no rashes, lesions, ulcers. No induration Neurologic: CN 2-12 grossly intact.  Normal speech.  Sensation intact,  Grip strength 4/5 bilaterally   Psychiatric: Normal judgment and insight.  Normal mood.   Labs on Admission: I have  personally reviewed following labs and imaging studies  CBC: Recent Labs  Lab 04/21/21 1211  WBC 10.1  NEUTROABS 6.5  HGB 15.5  HCT 42.9  MCV 94.9  PLT 93*    Basic Metabolic Panel: Recent Labs  Lab 04/21/21 1211  NA 138  K 2.8*  CL 89*  CO2 31  GLUCOSE 159*  BUN 11  CREATININE 0.92  CALCIUM 9.4    GFR: Estimated Creatinine Clearance: 77.1 mL/min (by C-G formula based on SCr of 0.92 mg/dL).  Liver Function Tests: Recent Labs  Lab 04/21/21 1211  AST 67*  ALT 42  ALKPHOS 40  BILITOT 2.1*  PROT 6.4*  ALBUMIN 3.9    Urine analysis:    Component Value Date/Time   COLORURINE YELLOW 04/21/2021 1147   APPEARANCEUR CLEAR 04/21/2021 1147   APPEARANCEUR Clear 09/09/2018 1113   LABSPEC 1.007 04/21/2021 1147   PHURINE 5.5 04/21/2021 1147   [place] 04/21/2021 1147   [place] 04/21/2021 1147   [place] 04/21/2021 1147   BILIRUBINUR Negative 09/09/2018 1113   KETONESUR 15 (A) 04/21/2021 1147   PROTEINUR NEGATIVE 04/21/2021 1147   UROBILINOGEN negative 04/11/2015 1012   UROBILINOGEN 1.0 04/09/2014 2250   NITRITE NEGATIVE 04/21/2021 1147    [place] 04/21/2021 1147    Radiological Exams on Admission: CT ABDOMEN PELVIS W CONTRAST  Result Date: 04/21/2021 CLINICAL DATA:  Nausea and vomiting. Symptoms began early this morning. EXAM: CT ABDOMEN AND PELVIS WITH CONTRAST TECHNIQUE: Multidetector CT imaging of the abdomen and pelvis was performed using the standard protocol following bolus administration of intravenous contrast. CONTRAST:  3mL OMNIPAQUE IOHEXOL 350 MG/ML SOLN COMPARISON:  08/15/2020 FINDINGS: Lower chest: Eccentric filling defects in the left lower lobe pulmonary artery and peripheral right main pulmonary artery consistent with pulmonary emboli, improved compared to a CTA chest, 02/23/2021. No convincing acute findings at the lung bases. Hepatobiliary: Decreased attenuation of the liver consistent with fatty infiltration. No mass or focal lesion. Normal gallbladder. No bile duct dilation. Pancreas: Unremarkable. No pancreatic ductal dilatation or surrounding inflammatory changes. Spleen: Normal in size without focal abnormality. Adrenals/Urinary Tract: Adrenal glands are unremarkable. Kidneys are normal, without renal calculi, focal lesion, or hydronephrosis. Bladder is unremarkable. Stomach/Bowel: Small sliding hiatal hernia. Stomach otherwise unremarkable. Small bowel and colon are normal in caliber. No wall thickening. No inflammatory changes. Vascular/Lymphatic: Aortic atherosclerosis. No aneurysm. No enlarged lymph nodes. Reproductive: Normal size prostate. Other: No abdominal wall hernia or abnormality. No abdominopelvic ascites. Musculoskeletal: No fracture or acute finding.  No bone lesion. IMPRESSION: 1. Nonocclusive filling defects in the peripheral right main pulmonary artery and left lower lobe pulmonary artery, representing an improvement in pulmonary emboli burden compared to the CTA chest dated 02/23/2021. 2. No acute abnormalities within the abdomen or pelvis. 3. Hepatic steatosis.  Aortic atherosclerosis.  Electronically Signed   By: Lajean Manes M.D.   On: 04/21/2021 14:02   DG Chest Portable 1 View  Result Date: 04/21/2021 CLINICAL DATA:  Shortness of breath, generalized weakness, bilateral on tremor nausea vomiting. EXAM: PORTABLE CHEST 1 VIEW COMPARISON:  Chest x-ray 02/23/2021, CT chest 02/23/2021 FINDINGS: The heart and mediastinal contours are unchanged. Aortic calcification. Similar-appearing subcentimeter calcified granuloma along the right upper lobe. No focal consolidation. No pulmonary edema. No pleural effusion. No pneumothorax. No acute osseous abnormality. IMPRESSION: No active disease. Electronically Signed   By: Iven Finn M.D.   On: 04/21/2021 16:57    EKG: Independently reviewed.  EKG shows normal sinus rhythm with left anterior  fascicular block.  No acute ST elevation or depression.  QTc 476  Assessment/Plan Principal Problem:   Hypokalemia Mr. Streater is placed on telemetry for observation.  He was given potassium supplementation in ER. Magnesium level was not checked.  Check potassium and magnesium level now and will replete as needed.  Active Problems:   Generalized weakness Consult PT for evaluation in am.  Fall precautions, up with assistance to prevent falls.     History of pulmonary embolus (PE)   History of DVT (deep vein thrombosis) Continue with Eliquis.     Elevated troponin Mildly elevated troponin level which appears at baseline. Will check two more serial levels to make sure no rise       Alcohol use Placed on librium to help prevent withdrawls. Placed on CIWA protocol.      Elevated bilirubin Check gallbladder u/s with abdominal pain and belching and elevated LFTs, bilirubin   DVT prophylaxis: Is anticoagulated on Eliquis which is continued  Code Status:   Full Code  Family Communication:  Diagnosis and plan discussed with patient and his daughter who is at bedside.  They verbalized understanding agree with plan.  Further recommendations to  follow as clinical indicated Disposition Plan:   Patient is from:  Home  Anticipated DC to:  Home  Anticipated DC date:  Anticipate less than 2 midnight stay but this could change depending on PT evaluation     and response to treatment   Admission status:  Observation   Eben Burow MD [facility]  How to contact the [facility] Attending or Consulting provider [place] or covering provider during after hours [place], for this patient?   Check the care team in [facility] and look for a) attending/consulting [facility] provider listed and b) the [facility] team listed Log into [email or web address] and use [place]'s universal password to access. If you do not have the password, please contact the hospital operator. Locate the [facility] provider you are looking for under [facility] and page to a number that you can be directly reached. If you still have difficulty reaching the provider, please page the [facility] (Director on Call) for the Hospitalists listed on amion for assistance.  04/21/2021, 7:38 PM

## 2021-04-21 NOTE — Plan of Care (Signed)
  Problem: Education: Goal: Knowledge of General Education information will improve Description: Including pain rating scale, medication(s)/side effects and non-pharmacologic comfort measures Outcome: Progressing   Problem: Health Behavior/Discharge Planning: Goal: Ability to manage health-related needs will improve Outcome: Progressing   Problem: Clinical Measurements: Goal: Ability to maintain clinical measurements within normal limits will improve Outcome: Progressing Goal: Diagnostic test results will improve Outcome: Progressing Goal: Respiratory complications will improve Outcome: Progressing Goal: Cardiovascular complication will be avoided Outcome: Progressing   Problem: Activity: Goal: Risk for activity intolerance will decrease Outcome: Progressing   Problem: Nutrition: Goal: Adequate nutrition will be maintained Outcome: Progressing   Problem: Elimination: Goal: Will not experience complications related to bowel motility Outcome: Progressing Goal: Will not experience complications related to urinary retention Outcome: Progressing   Problem: Pain Managment: Goal: General experience of comfort will improve Outcome: Progressing   Problem: Safety: Goal: Ability to remain free from injury will improve Outcome: Progressing   Problem: Skin Integrity: Goal: Risk for impaired skin integrity will decrease Outcome: Progressing   

## 2021-04-21 NOTE — ED Notes (Signed)
Pt ambulated to side of bed to provide urine specimen. Urine specimen obtained and sent to lab.

## 2021-04-21 NOTE — ED Notes (Signed)
Report called to [facility] and [facility].

## 2021-04-21 NOTE — ED Triage Notes (Signed)
Pt to ER via EMS form home with c/o generalized weakness, bilateral arm tremor and nausea/vomiting.  Pt reports that symptoms started around 3AM.

## 2021-04-22 DIAGNOSIS — R531 Weakness: Secondary | ICD-10-CM

## 2021-04-22 DIAGNOSIS — R778 Other specified abnormalities of plasma proteins: Secondary | ICD-10-CM | POA: Diagnosis not present

## 2021-04-22 DIAGNOSIS — R1010 Upper abdominal pain, unspecified: Secondary | ICD-10-CM | POA: Diagnosis not present

## 2021-04-22 DIAGNOSIS — E876 Hypokalemia: Secondary | ICD-10-CM | POA: Diagnosis not present

## 2021-04-22 DIAGNOSIS — R17 Unspecified jaundice: Secondary | ICD-10-CM

## 2021-04-22 LAB — BASIC METABOLIC PANEL
Anion gap: 12 (ref 5–15)
BUN: 11 mg/dL (ref 8–23)
CO2: 35 mmol/L — ABNORMAL HIGH (ref 22–32)
Calcium: 8.8 mg/dL — ABNORMAL LOW (ref 8.9–10.3)
Chloride: 94 mmol/L — ABNORMAL LOW (ref 98–111)
Creatinine, Ser: 1.09 mg/dL (ref 0.61–1.24)
GFR, Estimated: >60 mL/min (ref >60)
Glucose, Bld: 109 mg/dL — ABNORMAL HIGH (ref 70–99)
Potassium: 3.1 mmol/L — ABNORMAL LOW (ref 3.5–5.1)
Sodium: 141 mmol/L (ref 135–145)

## 2021-04-22 LAB — MAGNESIUM: Magnesium: 2.2 mg/dL (ref 1.7–2.4)

## 2021-04-22 MED ORDER — MAGNESIUM SULFATE 2 GM/50ML IV SOLN
2.0000 g | Freq: Once | INTRAVENOUS | Status: AC
  Administered 2021-04-22: 2 g via INTRAVENOUS
  Filled 2021-04-22: qty 50

## 2021-04-22 MED ORDER — POTASSIUM CHLORIDE CRYS ER 20 MEQ PO TBCR: 20.0000 meq | EXTENDED_RELEASE_TABLET | Freq: Every day | ORAL | 0 refills | Status: DC

## 2021-04-22 MED ORDER — POTASSIUM CHLORIDE 10 MEQ/100ML IV SOLN
10.0000 meq | INTRAVENOUS | Status: AC
Start: 2021-04-22 — End: 2021-04-22
  Administered 2021-04-22 (×3): 10 meq via INTRAVENOUS
  Filled 2021-04-22 (×3): qty 100

## 2021-04-22 NOTE — Discharge Summary (Signed)
Physician Discharge Summary   Patient ID: [name] [name] MRN: [id number] DOB/AGE: 1946-05-12 75 y.o.  Admit date: 04/21/2021 Discharge date: 04/22/2021  Primary Care Physician:  Dettinger, Fransisca Kaufmann, MD   Recommendations for Outpatient Follow-up:  Follow up with PCP in 1-2 weeks Continue Kdur 2mq daily for 3 days Please check follow-up labs for potassium next week  Home Health: Currently ambulatory at baseline Equipment/Devices:   Discharge Condition: stable  CODE STATUS: FULL or DNR   Diet recommendation:    Discharge Diagnoses:     Hypokalemia Hypomagnesemia  Hypertension, essential  History of pulmonary embolus (PE)   Consults: None   Allergies:   Allergies  Allergen Reactions   Cialis [Tadalafil] Other (See Comments)    Not allergic 02/23/21   Viagra [Sildenafil Citrate] Other (See Comments)    heartburn     DISCHARGE MEDICATIONS: Allergies as of 04/22/2021       Reactions   Cialis [tadalafil] Other (See Comments)   Not allergic 02/23/21   Viagra [sildenafil Citrate] Other (See Comments)   heartburn        Medication List     STOP taking these medications    furosemide 20 MG tablet Commonly known as: Lasix       TAKE these medications    apixaban 5 MG Tabs tablet Commonly known as: ELIQUIS Take 1 tablet (5 mg total) by mouth 2 (two) times daily.   gabapentin 100 MG capsule Commonly known as: NEURONTIN Take 1 capsule (100 mg total) by mouth at bedtime.   omeprazole 20 MG capsule Commonly known as: PRILOSEC TAKE 1 CAPSULE (20 MG TOTAL) BY MOUTH DAILY.   potassium chloride SA 20 MEQ tablet Commonly known as: KLOR-CON Take 1 tablet (20 mEq total) by mouth daily for 3 days.   vitamin B-12 100 MCG tablet Commonly known as: CYANOCOBALAMIN Take 100 mcg by mouth daily.   vitamin C 500 MG tablet Commonly known as: ASCORBIC ACID Take 500 mg by mouth daily.         Brief H and P: For complete details please refer to  admission H and P, but in brief , patient is a 75year old male with history of DVT/PEobesity, GERD who presents for evaluation of weakness. He was admitted in July 2022 for PE and was placed on eliquis at that time.  On the morning of admission, patient had some abdominal pain, bloating getting up and well.  He reported generalized weakness, difficulty getting up and moving around in the house.  He also reported belching but no nausea or vomiting.  Patient reported constipation, no BM in the last 3 to 4 days, states that is normal for him.  Denies any chest pain or palpitations.  Patient does not have diagnosis of CHF but he is on Lasix and the dose was increased by PCP few days ago.  His daughter reported that when she checked on him he was having tremors in hands but otherwise no fevers or chills or rigors. In ED, potassium very low 2.8, magnesium 1.5  Hospital Course:  Severe hypokalemia, hypomagnesemia -Likely due to increased dose of Lasix, patient reported that he has finished the 40 mg Lasix x5 days now -At the time of admission, potassium was 2.8, magnesium 1.5 -Potassium and magnesium was replaced aggressively -Magnesium 2.2 at the time of discharge, potassium 3.1 on the morning of discharge and was replaced again -Patient was given prescription for KDur 20 mEq daily.  3 days.  He was recommended strongly to  follow-up with PCP for repeat labs in next 4 to 5 days.  Generalized debility, weakness, neuropathy -Patient reports ambulatory at baseline now and has been walking to the bathroom without any difficulty, confirmed with the family member at the bedside. -Continue gabapentin  History of PE, DVT -Continue eliquis  Alcohol use -Patient was placed on Librium to help prevent withdrawals -Currently doing well, CIWA 2 this AM -Counseled on alcohol cessation  Belching, GERD -Continue PPI -AST mildly elevated 67, ALT, alk phos normal, total bilirubin 2.1 -Abdominal ultrasound was  obtained, which showed moderate to severe hepatic steatosis, no acute abnormality   Day of Discharge S: Sitting up in the chair, family member at the bedside.  Wants to go home feels close to his baseline.  BP 126/73 (BP Location: Right Arm)   Pulse 66   Temp 98.6 F (37 C) (Oral)   Resp 18   Ht '5\' 6"'  (1.676 m)   Wt 103.7 kg   SpO2 94%   BMI 36.90 kg/m   Physical Exam: General: Alert and awake oriented x3 not in any acute distress. CVS: S1-S2 clear no murmur rubs or gallops Chest: clear to auscultation bilaterally, no wheezing rales or rhonchi Abdomen: soft nontender, nondistended, normal bowel sounds Extremities: no cyanosis, clubbing or edema noted bilaterally Neuro: no new FND's    Get Medicines reviewed and adjusted: Please take all your medications with you for your next visit with your Primary MD  Please request your Primary MD to go over all hospital tests and procedure/radiological results at the follow up. Please ask your Primary MD to get all Hospital records sent to his/her office.  If you experience worsening of your admission symptoms, develop shortness of breath, life threatening emergency, suicidal or homicidal thoughts you must seek medical attention immediately by calling 911 or calling your MD immediately  if symptoms less severe.  You must read complete instructions/literature along with all the possible adverse reactions/side effects for all the Medicines you take and that have been prescribed to you. Take any new Medicines after you have completely understood and accept all the possible adverse reactions/side effects.   Do not drive when taking pain medications.   Do not take more than prescribed Pain, Sleep and Anxiety Medications  Special Instructions: If you have smoked or chewed Tobacco  in the last 2 yrs please stop smoking, stop any regular Alcohol  and or any Recreational drug use.  Wear Seat belts while driving.  Please note  You were cared  for by a hospitalist during your hospital stay. Once you are discharged, your primary care physician will handle any further medical issues. Please note that NO REFILLS for any discharge medications will be authorized once you are discharged, as it is imperative that you return to your primary care physician (or establish a relationship with a primary care physician if you do not have one) for your aftercare needs so that they can reassess your need for medications and monitor your lab values.   The results of significant diagnostics from this hospitalization (including imaging, microbiology, ancillary and laboratory) are listed below for reference.      Procedures/Studies:  CT ABDOMEN PELVIS W CONTRAST  Result Date: 04/21/2021 CLINICAL DATA:  Nausea and vomiting. Symptoms began early this morning. EXAM: CT ABDOMEN AND PELVIS WITH CONTRAST TECHNIQUE: Multidetector CT imaging of the abdomen and pelvis was performed using the standard protocol following bolus administration of intravenous contrast. CONTRAST:  82m OMNIPAQUE IOHEXOL 350 MG/ML SOLN COMPARISON:  08/15/2020 FINDINGS: Lower chest: Eccentric filling defects in the left lower lobe pulmonary artery and peripheral right main pulmonary artery consistent with pulmonary emboli, improved compared to a CTA chest, 02/23/2021. No convincing acute findings at the lung bases. Hepatobiliary: Decreased attenuation of the liver consistent with fatty infiltration. No mass or focal lesion. Normal gallbladder. No bile duct dilation. Pancreas: Unremarkable. No pancreatic ductal dilatation or surrounding inflammatory changes. Spleen: Normal in size without focal abnormality. Adrenals/Urinary Tract: Adrenal glands are unremarkable. Kidneys are normal, without renal calculi, focal lesion, or hydronephrosis. Bladder is unremarkable. Stomach/Bowel: Small sliding hiatal hernia. Stomach otherwise unremarkable. Small bowel and colon are normal in caliber. No wall  thickening. No inflammatory changes. Vascular/Lymphatic: Aortic atherosclerosis. No aneurysm. No enlarged lymph nodes. Reproductive: Normal size prostate. Other: No abdominal wall hernia or abnormality. No abdominopelvic ascites. Musculoskeletal: No fracture or acute finding.  No bone lesion. IMPRESSION: 1. Nonocclusive filling defects in the peripheral right main pulmonary artery and left lower lobe pulmonary artery, representing an improvement in pulmonary emboli burden compared to the CTA chest dated 02/23/2021. 2. No acute abnormalities within the abdomen or pelvis. 3. Hepatic steatosis.  Aortic atherosclerosis. Electronically Signed   By: Lajean Manes M.D.   On: 04/21/2021 14:02   DG Chest Portable 1 View  Result Date: 04/21/2021 CLINICAL DATA:  Shortness of breath, generalized weakness, bilateral on tremor nausea vomiting. EXAM: PORTABLE CHEST 1 VIEW COMPARISON:  Chest x-ray 02/23/2021, CT chest 02/23/2021 FINDINGS: The heart and mediastinal contours are unchanged. Aortic calcification. Similar-appearing subcentimeter calcified granuloma along the right upper lobe. No focal consolidation. No pulmonary edema. No pleural effusion. No pneumothorax. No acute osseous abnormality. IMPRESSION: No active disease. Electronically Signed   By: Iven Finn M.D.   On: 04/21/2021 16:57   US Abdomen Limited RUQ (LIVER/GB)  Result Date: 04/21/2021 CLINICAL DATA:  Hyperbilirubinemia, abdominal pain EXAM: ULTRASOUND ABDOMEN LIMITED RIGHT UPPER QUADRANT COMPARISON:  None. FINDINGS: Gallbladder: No gallstones or wall thickening visualized. No sonographic Murphy sign noted by sonographer. Common bile duct: Diameter: 4 mm in proximal diameter Liver: Evaluation of the hepatic parenchyma is markedly limited due to acoustic attenuation likely related to moderate to severe hepatic steatosis, better appreciated on accompanying CT examination of 04/21/2021. This significantly diminishes the sensitivity for detection of focal  intrahepatic masses, though none are identified. No definite intrahepatic biliary ductal dilation. Portal vein is patent on color Doppler imaging with normal direction of blood flow towards the liver. Other: No ascites IMPRESSION: Technically limited examination likely related to moderate to severe hepatic steatosis. No acute abnormality. Electronically Signed   By: Fidela Salisbury M.D.   On: 04/21/2021 21:37      LAB RESULTS: Basic Metabolic Panel: Recent Labs  Lab 04/21/21 1211 04/21/21 1211 04/21/21 1918 04/22/21 0702  NA 138  --   --  141  K 2.8*  --  3.1* 3.1*  CL 89*  --   --  94*  CO2 31  --   --  35*  GLUCOSE 159*  --   --  109*  BUN 11  --   --  11  CREATININE 0.92  --   --  1.09  CALCIUM 9.4  --   --  8.8*  MG  --    < > 1.5* 2.2   < > = values in this interval not displayed.   Liver Function Tests: Recent Labs  Lab 04/21/21 1211  AST 67*  ALT 42  ALKPHOS 40  BILITOT 2.1*  PROT  6.4*  ALBUMIN 3.9   Recent Labs  Lab 04/21/21 1211  LIPASE 16   No results for input(s): AMMONIA in the last 168 hours. CBC: Recent Labs  Lab 04/21/21 1211  WBC 10.1  NEUTROABS 6.5  HGB 15.5  HCT 42.9  MCV 94.9  PLT 93*   Cardiac Enzymes: No results for input(s): CKTOTAL, CKMB, CKMBINDEX, TROPONINI in the last 168 hours. BNP: Invalid input(s): POCBNP CBG: No results for input(s): GLUCAP in the last 168 hours.     Disposition and Follow-up: Discharge Instructions     Diet - low sodium heart healthy   Complete by: As directed    Increase activity slowly   Complete by: As directed         DISPOSITION: Home   DISCHARGE FOLLOW-UP  Follow-up Information     Dettinger, Fransisca Kaufmann, MD. Schedule an appointment as soon as possible for a visit.   Specialties: Family Medicine, Cardiology Why: obtain labs, for potassium, magnesium Contact information: [address] [place] [id number] [phone]                  Time coordinating discharge:  54mns    Signed:   REstill CottaM.D. [facility] 04/22/2021, 11:13 AM

## 2021-04-22 NOTE — Progress Notes (Signed)
AVS given to patient and explained at the bedside. Medications and follow up appointments have been explained with pt verbalizing understanding.  

## 2021-04-23 ENCOUNTER — Telehealth: Payer: Self-pay | Admitting: Family Medicine

## 2021-04-23 DIAGNOSIS — R600 Localized edema: Secondary | ICD-10-CM | POA: Insufficient documentation

## 2021-04-23 NOTE — Telephone Encounter (Signed)
Transition Care Management Follow-up Telephone Call Date of discharge and from where: 04/22/21 [facility] Diagnosis:   hypokalemia, hypomagnesemia How have you been since you were released from the hospital? He feels fine now Any questions or concerns? No  Items Reviewed: Did the pt receive and understand the discharge instructions provided? Yes  Medications obtained and verified? Yes  Other? No  Any new allergies since your discharge? No  Dietary orders reviewed? Yes Do you have support at home? Yes   Home Care and Equipment/Supplies: Were home health services ordered? no If so, what is the name of the agency? N/A  Has the agency set up a time to come to the patient's home? no Were any new equipment or medical supplies ordered?  No What is the name of the medical supply agency? N/A Were you able to get the supplies/equipment? not applicable Do you have any questions related to the use of the equipment or supplies? No  Functional Questionnaire: (I = Independent and D = Dependent) ADLs: I  Bathing/Dressing- I  Meal Prep- I  Eating- I  Maintaining continence- I  Transferring/Ambulation- I  Managing Meds- I  Follow up appointments reviewed:  PCP Hospital f/u appt confirmed? Yes  Scheduled to see Marjorie Smolder on 9/29 @ 11. [facility] f/u appt confirmed? No   Are transportation arrangements needed? No  If their condition worsens, is the pt aware to call PCP or go to the Emergency Dept.? Yes Was the patient provided with contact information for the PCP's office or ED? Yes Was to pt encouraged to call back with questions or concerns? Yes

## 2021-04-26 ENCOUNTER — Other Ambulatory Visit: Payer: Self-pay

## 2021-04-26 ENCOUNTER — Encounter: Payer: Self-pay | Admitting: Family Medicine

## 2021-04-26 ENCOUNTER — Ambulatory Visit (INDEPENDENT_AMBULATORY_CARE_PROVIDER_SITE_OTHER): Payer: Medicare HMO | Admitting: Family Medicine

## 2021-04-26 VITALS — BP 134/83 | HR 55 | Temp 97.4°F | Ht 66.0 in | Wt 238.4 lb

## 2021-04-26 DIAGNOSIS — I10 Essential (primary) hypertension: Secondary | ICD-10-CM

## 2021-04-26 DIAGNOSIS — Z09 Encounter for follow-up examination after completed treatment for conditions other than malignant neoplasm: Secondary | ICD-10-CM | POA: Diagnosis not present

## 2021-04-26 DIAGNOSIS — E876 Hypokalemia: Secondary | ICD-10-CM

## 2021-04-26 LAB — CULTURE, BLOOD (ROUTINE X 2)
Culture: NO GROWTH
Culture: NO GROWTH
Special Requests: ADEQUATE
Special Requests: ADEQUATE

## 2021-04-26 NOTE — Patient Instructions (Signed)
Hypokalemia Hypokalemia means that the amount of potassium in the blood is lower than normal. Potassium is a chemical (electrolyte) that helps regulate the amount of fluid in the body. It also stimulates muscle tightening (contraction) and helps nerves work properly. Normally, most of the body's potassium is inside cells, and only a very small amount is in the blood. Because the amount in the blood is so small, minor changes to potassium levels in the blood can be life-threatening. What are the causes? This condition may be caused by: Antibiotic medicine. Diarrhea or vomiting. Taking too much of a medicine that helps you have a bowel movement (laxative) can cause diarrhea and lead to hypokalemia. Chronic kidney disease (CKD). Medicines that help the body get rid of excess fluid (diuretics). Eating disorders, such as bulimia. Low magnesium levels in the body. Sweating a lot. What are the signs or symptoms? Symptoms of this condition include: Weakness. Constipation. Fatigue. Muscle cramps. Mental confusion. Skipped heartbeats or irregular heartbeat (palpitations). Tingling or numbness. How is this diagnosed? This condition is diagnosed with a blood test. How is this treated? This condition may be treated by: Taking potassium supplements by mouth. Adjusting the medicines that you take. Eating more foods that contain a lot of potassium. If your potassium level is very low, you may need to get potassium through an IV and be monitored in the hospital. Follow these instructions at home:  Take over-the-counter and prescription medicines only as told by your health care provider. This includes vitamins and supplements. Eat a healthy diet. A healthy diet includes fresh fruits and vegetables, whole grains, healthy fats, and lean proteins. If instructed, eat more foods that contain a lot of potassium. This includes: Nuts, such as peanuts and pistachios. Seeds, such as sunflower seeds and  pumpkin seeds. Peas, lentils, and lima beans. Whole grain and bran cereals and breads. Fresh fruits and vegetables, such as apricots, avocado, bananas, cantaloupe, kiwi, oranges, tomatoes, asparagus, and potatoes. Orange juice. Tomato juice. Red meats. Yogurt. Keep all follow-up visits as told by your health care provider. This is important. Contact a health care provider if you: Have weakness that gets worse. Feel your heart pounding or racing. Vomit. Have diarrhea. Have diabetes (diabetes mellitus) and you have trouble keeping your blood sugar (glucose) in your target range. Get help right away if you: Have chest pain. Have shortness of breath. Have vomiting or diarrhea that lasts for more than 2 days. Faint. Summary Hypokalemia means that the amount of potassium in the blood is lower than normal. This condition is diagnosed with a blood test. Hypokalemia may be treated by taking potassium supplements, adjusting the medicines that you take, or eating more foods that are high in potassium. If your potassium level is very low, you may need to get potassium through an IV and be monitored in the hospital. This information is not intended to replace advice given to you by your health care provider. Make sure you discuss any questions you have with your health care provider. Document Revised: 02/24/2018 Document Reviewed: 02/25/2018 Elsevier Patient Education  2022 [employer]  

## 2021-04-26 NOTE — Progress Notes (Signed)
Established Patient Office Visit  Subjective:  Patient ID: [name] [name], male    DOB: 08-04-1945  Age: 75 y.o. MRN: [id number]  CC:  Chief Complaint  Patient presents with   Transitions Of Care    HPI [name] presents for [facility].   Today's visit was for Transitional Care Management.  The patient was discharged from [facility] on 04/21/21 with a primary diagnosis of hypokalemia.   Contact with the patient and/or caregiver, by a clinical staff member, was made on 04/23/21 and was documented as a telephone encounter within the EMR.  Through chart review and discussion with the patient I have determined that management of their condition is of moderate complexity.   [name] present to the ED for weakness. His lasix dosage had recently been increased. Family also noticed that he was having tremors in his hands. On admission his K+ was 2.8 and magnesium was 1.5. Both were replaced in the hospital. At discharge his K+ was 3.1 and magnesium was 2.2. He was also given a 20 mg oral potassium supplement for 3 days. He completed this yesterday. He reports doing well now. He denies weakness, confusion, tremors, rigors, fevers, chills, chest pain, palpitations or shortness of breath. He feels back to his baseline.     Past Medical History:  Diagnosis Date   Colon polyp    GERD (gastroesophageal reflux disease)    Hemorrhoid    Hyperplasia of prostate    Hypertension    Other and unspecified hyperlipidemia    Other testicular hypofunction    Pneumonia    Pulmonary embolism (HCC)    Sinus bradycardia     Past Surgical History:  Procedure Laterality Date   APPENDECTOMY     HAND SURGERY Right     Family History  Problem Relation Age of Onset   Heart attack Mother    Stroke Mother    Diabetes Father    Diabetes Daughter     Social History   Socioeconomic History   Marital status: Widowed    Spouse name: Psychiatrist   Number of children: 3   Years of education: 10    Highest education level: 10th grade  Occupational History   Occupation: retired  Tobacco Use   Smoking status: Former    Types: Cigars    Quit date: 2015    Years since quitting: 7.7   Smokeless tobacco: Current    Types: Chew   Tobacco comments:    only 1 cigar on [employer] Use   Vaping Use: Never used  Substance and Sexual Activity   Alcohol use: Yes    Comment: daily, up to 1/2 pint   Drug use: No   Sexual activity: Not Currently  Other Topics Concern   Not on file  Social History Narrative   Grandson lives with him   Social Determinants of Health   Financial Resource Strain: Low Risk    Difficulty of Paying Living Expenses: Not very hard  Food Insecurity: No Food Insecurity   Worried About Charity fundraiser in the Last Year: Never true   [place] in the Last Year: Never true  Transportation Needs: No Transportation Needs   Lack of Transportation (Medical): No   Lack of Transportation (Non-Medical): No  Physical Activity: Insufficiently Active   Days of Exercise per Week: 7 days   Minutes of Exercise per Session: 10 min  Stress: No Stress Concern Present   Feeling of Stress : Not  at all  Social Connections: Socially Isolated   Frequency of Communication with Friends and Family: More than three times a week   Frequency of Social Gatherings with Friends and Family: More than three times a week   Attends Religious Services: Never   Marine scientist or Organizations: Not on file   Attends Archivist Meetings: Never   Marital Status: Widowed  Human resources officer Violence: Not At Risk   Fear of Current or Ex-Partner: No   Emotionally Abused: No   Physically Abused: No   Sexually Abused: No    Outpatient Medications Prior to Visit  Medication Sig Dispense Refill   apixaban (ELIQUIS) 5 MG TABS tablet Take 1 tablet (5 mg total) by mouth 2 (two) times daily. 180 tablet 3   omeprazole (PRILOSEC) 20 MG capsule TAKE 1 CAPSULE (20 MG TOTAL)  BY MOUTH DAILY. 90 capsule 0   vitamin B-12 (CYANOCOBALAMIN) 100 MCG tablet Take 100 mcg by mouth daily.     vitamin C (ASCORBIC ACID) 500 MG tablet Take 500 mg by mouth daily.     gabapentin (NEURONTIN) 100 MG capsule Take 1 capsule (100 mg total) by mouth at bedtime. (Patient not taking: Reported on 04/26/2021) 30 capsule 5   potassium chloride SA (KLOR-CON) 20 MEQ tablet Take 1 tablet (20 mEq total) by mouth daily for 3 days. 3 tablet 0   No facility-administered medications prior to visit.    Allergies  Allergen Reactions   Cialis [Tadalafil] Other (See Comments)    Not allergic 02/23/21   Viagra [Sildenafil Citrate] Other (See Comments)    heartburn    ROS Review of Systems As per HPI.    Objective:    Physical Exam Vitals and nursing note reviewed.  Constitutional:      General: He is not in acute distress.    Appearance: He is not ill-appearing, toxic-appearing or diaphoretic.  Cardiovascular:     Rate and Rhythm: Normal rate and regular rhythm.     Heart sounds: Normal heart sounds. No murmur heard. Pulmonary:     Effort: Pulmonary effort is normal. No respiratory distress.     Breath sounds: Normal breath sounds.  Abdominal:     General: Bowel sounds are normal. There is no distension.     Palpations: Abdomen is soft.     Tenderness: There is no abdominal tenderness. There is no guarding or rebound.  Skin:    General: Skin is warm and dry.  Neurological:     Mental Status: He is alert and oriented to person, place, and time. Mental status is at baseline.     Motor: No weakness.     Gait: Gait normal.  Psychiatric:        Mood and Affect: Mood normal.        Behavior: Behavior normal.        Thought Content: Thought content normal.        Judgment: Judgment normal.    BP 134/83   Pulse (!) 55   Temp (!) 97.4 F (36.3 C) (Temporal)   Ht '5\' 6"'  (1.676 m)   Wt 238 lb 6 oz (108.1 kg)   BMI 38.47 kg/m  Wt Readings from Last 3 Encounters:  04/26/21 238 lb 6  oz (108.1 kg)  04/21/21 228 lb 9.9 oz (103.7 kg)  04/16/21 242 lb (109.8 kg)     Health Maintenance Due  Topic Date Due   TETANUS/TDAP  07/29/2013    There are no preventive  care reminders to display for this patient.  Lab Results  Component Value Date   TSH 3.430 12/29/2015   Lab Results  Component Value Date   WBC 10.1 04/21/2021   HGB 15.5 04/21/2021   HCT 42.9 04/21/2021   MCV 94.9 04/21/2021   PLT 93 (L) 04/21/2021   Lab Results  Component Value Date   NA 141 04/22/2021   K 3.1 (L) 04/22/2021   CO2 35 (H) 04/22/2021   GLUCOSE 109 (H) 04/22/2021   BUN 11 04/22/2021   CREATININE 1.09 04/22/2021   BILITOT 2.1 (H) 04/21/2021   ALKPHOS 40 04/21/2021   AST 67 (H) 04/21/2021   ALT 42 04/21/2021   PROT 6.4 (L) 04/21/2021   ALBUMIN 3.9 04/21/2021   CALCIUM 8.8 (L) 04/22/2021   ANIONGAP 12 04/22/2021   EGFR 90 03/01/2021   GFR 84.28 04/09/2021   Lab Results  Component Value Date   CHOL 152 10/11/2020   Lab Results  Component Value Date   HDL 52 10/11/2020   Lab Results  Component Value Date   LDLCALC 80 10/11/2020   Lab Results  Component Value Date   TRIG 108 10/11/2020   Lab Results  Component Value Date   CHOLHDL 2.9 10/11/2020   Lab Results  Component Value Date   HGBA1C 5.6% 10/11/2013      Assessment & Plan:   [place] was seen today for transitions of care.  Diagnoses and all orders for this visit:  Hypokalemia Hypomagnesemia Treated in hospital. Labs pending. Asymptomatic.  -     BMP8+EGFR -     Magnesium  Primary hypertension Well controlled on current regimen.  [facility] discharge follow-up Reviewed ED records.    Follow-up: Return if symptoms worsen or fail to improve. Keep scheduled follow up with PCP.    The patient indicates understanding of these issues and agrees with the plan.  Gwenlyn Perking, FNP

## 2021-04-27 LAB — BMP8+EGFR
BUN/Creatinine Ratio: 13 (ref 10–24)
BUN: 12 mg/dL (ref 8–27)
CO2: 30 mmol/L — ABNORMAL HIGH (ref 20–29)
Calcium: 9.8 mg/dL (ref 8.6–10.2)
Chloride: 99 mmol/L (ref 96–106)
Creatinine, Ser: 0.94 mg/dL (ref 0.76–1.27)
Glucose: 123 mg/dL — ABNORMAL HIGH (ref 70–99)
Potassium: 3.9 mmol/L (ref 3.5–5.2)
Sodium: 143 mmol/L (ref 134–144)
eGFR: 84 mL/min/{1.73_m2} (ref >59)

## 2021-04-27 LAB — MAGNESIUM: Magnesium: 1.7 mg/dL (ref 1.6–2.3)

## 2021-05-07 ENCOUNTER — Ambulatory Visit (HOSPITAL_COMMUNITY): Payer: Medicare HMO | Attending: Cardiovascular Disease

## 2021-05-07 ENCOUNTER — Other Ambulatory Visit: Payer: Self-pay

## 2021-05-07 DIAGNOSIS — R600 Localized edema: Secondary | ICD-10-CM | POA: Diagnosis not present

## 2021-05-07 LAB — ECHOCARDIOGRAM COMPLETE
Area-P 1/2: 2.7 cm2
S' Lateral: 3.3 cm

## 2021-06-06 ENCOUNTER — Other Ambulatory Visit: Payer: Self-pay | Admitting: Nurse Practitioner

## 2021-06-06 DIAGNOSIS — R609 Edema, unspecified: Secondary | ICD-10-CM

## 2021-06-11 ENCOUNTER — Encounter: Payer: Self-pay | Admitting: Pulmonary Disease

## 2021-06-11 ENCOUNTER — Other Ambulatory Visit: Payer: Self-pay

## 2021-06-11 ENCOUNTER — Ambulatory Visit: Payer: Medicare HMO | Admitting: Pulmonary Disease

## 2021-06-11 VITALS — BP 142/82 | HR 70 | Temp 98.4°F | Ht 66.0 in | Wt 238.0 lb

## 2021-06-11 DIAGNOSIS — Z7189 Other specified counseling: Secondary | ICD-10-CM

## 2021-06-11 DIAGNOSIS — I2699 Other pulmonary embolism without acute cor pulmonale: Secondary | ICD-10-CM | POA: Diagnosis not present

## 2021-06-11 DIAGNOSIS — R600 Localized edema: Secondary | ICD-10-CM

## 2021-06-11 NOTE — Patient Instructions (Signed)
Unprovoked pulmonary embolism Acute LLE DVT Reviewed echocardiogram - normal. No need for Cardiology --Continue Eliquis 5 mg twice a day indefinitely.  --Contact our office when you schedule for knee surgery. Recommend holding Eliquis prior to procedure --Encourage regular aerobic activity 20 minutes five days a week  Lower extremity edema -improved --Lasix daily per PCP  Follow-up with me in 6 months

## 2021-06-11 NOTE — Progress Notes (Signed)
Subjective:   PATIENT ID: [name] [name] GENDER: male DOB: [birth year], MRN: [id number]   HPI  Chief Complaint  Patient presents with   Follow-up    Lower extremity edema    Reason for Visit: Follow-up   [name] is a 75 year old male with history of pulmonary embolism who presents for follow-up.  Summary He was previously seen by me for pulmonary embolism and bilateral DVTs presumed to be provoked by COVID-19 in October 2021. He completed 6 months of anticoagulation in April 2022. Unfortunately he was readmitted last month in July 2022 for acute submassive PE that was unprovoked. Pulmonary inpatient note by Dr. Shearon Stalls on 02/23/21 reviewed. He was treated with heparin and transitioned to Eliquis. No oxygen requirement. Denies bleeding issues. Minimal bruising.  Has shortness of breath with exertion. He does not ambulate at all at home, stating he is very sedentary.  06/11/2021 Since his last visit, he had lower extremity swelling which was treated with additional Lasix.  Unfortunately he was hospitalized briefly for hypokalemia.  Has not had any lower extremity recently. Reports shortness of breath with exertion however has better stamina compared to last time. Denies wheezing or cough. Reports he is able to ambulate short distances in the house. 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 He is not dedicating daily time to aerobic activity. He feels his activity is mainly limited to his knee pain. Would like to discuss with his doctors regarding knee surgery.  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 180 tablet 3   furosemide (LASIX) 20 MG tablet TAKE 1 TABLET BY MOUTH EVERY DAY 30 tablet 3   omeprazole (PRILOSEC) 20 MG capsule TAKE 1 CAPSULE (20 MG TOTAL) BY MOUTH DAILY. 90 capsule 0   vitamin B-12 (CYANOCOBALAMIN) 100 MCG tablet Take 100 mcg by mouth daily.     vitamin C (ASCORBIC ACID) 500 MG tablet Take 500 mg by mouth daily.     gabapentin (NEURONTIN) 100 MG capsule Take 1 capsule (100 mg total) by mouth at bedtime. (Patient not taking: Reported on 06/11/2021) 30 capsule 5   No facility-administered medications prior to visit.    Review of Systems  Constitutional:  Negative for chills, diaphoresis, fever, malaise/fatigue and weight loss.  HENT:  Negative for congestion.   Respiratory:  Positive for shortness of breath. Negative for cough, hemoptysis, sputum production and wheezing.   Cardiovascular:  Negative for chest pain, palpitations and leg swelling.  Musculoskeletal:  Positive for joint pain.    Objective:   Vitals:   06/11/21 0907  BP: (!) 142/82  Pulse: 70  Temp: 98.4 F (36.9 C)  TempSrc: Oral  SpO2: 98%  Weight: 238 lb (108 kg)  Height: 5\' 6"  (1.676 m)   SpO2: 98 % O2 Device: None (Room air)  Physical Exam: General: Well-appearing, no acute distress HENT: [place], AT Eyes: EOMI, no  scleral icterus Respiratory: Clear to auscultation bilaterally.  No crackles, wheezing or rales Cardiovascular: RRR, -M/R/G, no JVD Extremities:-Edema,-tenderness Neuro: AAO x4, CNII-XII grossly intact Psych: Normal mood, normal affect  Data Reviewed:  Imaging: CTA 05/31/20 - Acute PE bilaterally. GGO R>L suggestive of pulmonary infarct LE Dopplers 06/01/20 - Left DVT involving popliteal, posterior tibial and peroneal veins  CTA 02/23/21 - Large bilateral PE with right heart strain Bilateral LE Dopplers  02/23/21  - Acute deep vein thrombosis involving the right common femoral vein, SF junction, right popliteal vein, right posterior tibial veins, and right peroneal veins. - Partial thrombus noted in the external iliac through to, at least, the mid common iliac. Unable to visualize the distal common iliac or the IVC secondary to bowel gas.  CXR 04/21/21 - Unchanged subcentimeter calcified granuloma  PFT: None on file  Echocardiogram: 05/07/2021 - EF 60 to 65%.  No WMA.  Normal RV size.  No valvular abnormalities.    Assessment & Plan:   Discussion: 75 year old male with recurrent pulmonary embolism/DVT.  Initially thought secondary to COVID-19 however sedentary lifestyle presents significant risk for recurrence.  Given that he has had two life-threatening hospitalizations for pulmonary embolism, will need lifelong anticoagulation. Planning for surgery. Counseled on risks of holding anticoagulation including potential for clot. Ok to hold anticoagulation prior to procedure however if off for prolonged period of time, would recommend IVC filter.  Unprovoked pulmonary embolism Acute LLE DVT Reviewed echocardiogram - normal. No need for Cardiology referral --Continue Eliquis 5 mg twice a day indefinitely.  --Contact our office when you schedule for knee surgery. Recommend holding Eliquis prior to procedure --Encourage regular aerobic activity 20 minutes five days a week  Lower extremity edema -improved --Lasix daily per PCP  Health Maintenance Immunization History  Administered Date(s) Administered   Pneumococcal Conjugate-13 05/03/2013   Pneumococcal Polysaccharide-23 07/29/2009   Td 07/30/2003   CT Lung Screen - not indicated  No orders of the defined types were placed in this encounter.  No orders of the defined types were placed in this encounter.   Return in about 6 months (around 12/09/2021).  I have spent a total time of 36-minutes on the day of the appointment reviewing  prior documentation, coordinating care and discussing medical diagnosis and plan with the patient/family. Past medical history, allergies, medications were reviewed. Pertinent imaging, labs and tests included in this note have been reviewed and interpreted independently by me.  [place], MD [facility] 06/11/2021 9:29 AM  Office Number [phone]

## 2021-06-12 DIAGNOSIS — M9903 Segmental and somatic dysfunction of lumbar region: Secondary | ICD-10-CM | POA: Diagnosis not present

## 2021-06-12 DIAGNOSIS — M5136 Other intervertebral disc degeneration, lumbar region: Secondary | ICD-10-CM | POA: Diagnosis not present

## 2021-06-14 DIAGNOSIS — M5136 Other intervertebral disc degeneration, lumbar region: Secondary | ICD-10-CM | POA: Diagnosis not present

## 2021-06-14 DIAGNOSIS — M9903 Segmental and somatic dysfunction of lumbar region: Secondary | ICD-10-CM | POA: Diagnosis not present

## 2021-06-18 ENCOUNTER — Ambulatory Visit: Payer: Medicare HMO | Admitting: Family Medicine

## 2021-06-19 ENCOUNTER — Encounter: Payer: Self-pay | Admitting: Family Medicine

## 2021-06-22 ENCOUNTER — Emergency Department (HOSPITAL_COMMUNITY): Payer: Medicare HMO

## 2021-06-22 ENCOUNTER — Emergency Department (HOSPITAL_COMMUNITY)
Admission: EM | Admit: 2021-06-22 | Discharge: 2021-06-22 | Disposition: A | Discharge status: Home / Self Care | Payer: Medicare HMO | Attending: Emergency Medicine | Admitting: Emergency Medicine

## 2021-06-22 ENCOUNTER — Encounter (HOSPITAL_COMMUNITY): Payer: Self-pay

## 2021-06-22 DIAGNOSIS — R0689 Other abnormalities of breathing: Secondary | ICD-10-CM | POA: Diagnosis not present

## 2021-06-22 DIAGNOSIS — Z87891 Personal history of nicotine dependence: Secondary | ICD-10-CM | POA: Diagnosis not present

## 2021-06-22 DIAGNOSIS — M795 Residual foreign body in soft tissue: Secondary | ICD-10-CM | POA: Diagnosis not present

## 2021-06-22 DIAGNOSIS — S6991XA Unspecified injury of right wrist, hand and finger(s), initial encounter: Secondary | ICD-10-CM | POA: Diagnosis not present

## 2021-06-22 DIAGNOSIS — R0602 Shortness of breath: Secondary | ICD-10-CM | POA: Diagnosis not present

## 2021-06-22 DIAGNOSIS — I959 Hypotension, unspecified: Secondary | ICD-10-CM | POA: Diagnosis not present

## 2021-06-22 DIAGNOSIS — R0609 Other forms of dyspnea: Secondary | ICD-10-CM | POA: Diagnosis not present

## 2021-06-22 DIAGNOSIS — I10 Essential (primary) hypertension: Secondary | ICD-10-CM | POA: Diagnosis not present

## 2021-06-22 DIAGNOSIS — M7989 Other specified soft tissue disorders: Secondary | ICD-10-CM | POA: Diagnosis not present

## 2021-06-22 LAB — CBC WITH DIFFERENTIAL/PLATELET
Abs Immature Granulocytes: 0.03 10*3/uL (ref 0.00–0.07)
Basophils Absolute: 0.1 10*3/uL (ref 0.0–0.1)
Basophils Relative: 1 %
Eosinophils Absolute: 0 10*3/uL (ref 0.0–0.5)
Eosinophils Relative: 0 %
HCT: 44.3 % (ref 39.0–52.0)
Hemoglobin: 15.3 g/dL (ref 13.0–17.0)
Immature Granulocytes: 0 %
Lymphocytes Relative: 20 %
Lymphs Abs: 1.6 10*3/uL (ref 0.7–4.0)
MCH: 34.5 pg — ABNORMAL HIGH (ref 26.0–34.0)
MCHC: 34.5 g/dL (ref 30.0–36.0)
MCV: 100 fL (ref 80.0–100.0)
Monocytes Absolute: 0.4 10*3/uL (ref 0.1–1.0)
Monocytes Relative: 6 %
Neutro Abs: 5.6 10*3/uL (ref 1.7–7.7)
Neutrophils Relative %: 73 %
Platelets: 143 10*3/uL — ABNORMAL LOW (ref 150–400)
RBC: 4.43 MIL/uL (ref 4.22–5.81)
RDW: 13.2 % (ref 11.5–15.5)
WBC: 7.7 10*3/uL (ref 4.0–10.5)
nRBC: 0 % (ref 0.0–0.2)

## 2021-06-22 LAB — PROTIME-INR
INR: 1.4 — ABNORMAL HIGH (ref 0.8–1.2)
Prothrombin Time: 16.7 seconds — ABNORMAL HIGH (ref 11.4–15.2)

## 2021-06-22 LAB — BRAIN NATRIURETIC PEPTIDE: B Natriuretic Peptide: 53 pg/mL (ref 0.0–100.0)

## 2021-06-22 LAB — BASIC METABOLIC PANEL
Anion gap: 18 — ABNORMAL HIGH (ref 5–15)
BUN: 12 mg/dL (ref 8–23)
CO2: 27 mmol/L (ref 22–32)
Calcium: 8.7 mg/dL — ABNORMAL LOW (ref 8.9–10.3)
Chloride: 96 mmol/L — ABNORMAL LOW (ref 98–111)
Creatinine, Ser: 0.96 mg/dL (ref 0.61–1.24)
GFR, Estimated: >60 mL/min (ref >60)
Glucose, Bld: 111 mg/dL — ABNORMAL HIGH (ref 70–99)
Potassium: 3.8 mmol/L (ref 3.5–5.1)
Sodium: 141 mmol/L (ref 135–145)

## 2021-06-22 LAB — TROPONIN I (HIGH SENSITIVITY): Troponin I (High Sensitivity): 8 ng/L (ref <18)

## 2021-06-22 NOTE — ED Triage Notes (Signed)
Pt came in via [facility], states he went to [employer] and after walking around became short of breath. His daughter took him to urgent care who sent him here. Sats 97% on room air at this time. Denies difficulty breathing at this time. Says he just hurts all over.

## 2021-06-22 NOTE — ED Provider Notes (Signed)
  Face-to-face evaluation   History: He presents for evaluation of shortness of breath which occurred while he was walking out of a store to get to his truck.  He noticed shortness of breath with walking and then had difficulty getting in the truck.  His family member, assisted him and he became very "panicky," and felt like he could not breathe.  Because of that he decided to go an urgent care.  When he got to the urgent care his saturation was 96%.  He was subsequently referred here for further evaluation.  He has a history of PE, provoked by COVID infection, last year.  He was treated with anticoagulants for 6 months, then relapsed with a submassive PE and was placed back on anticoagulant/Eliquis.  He is typically very sedentary.  He has chronic knee pain.  Physical exam: Elderly, alert and cooperative.  He is overweight.  Lungs good air bilaterally no wheezes rales or rhonchi.  Oxygen saturation 100% on room air.  Extremities with right greater than left peripheral edema which is chronic.  Medical screening examination/treatment/procedure(s) were conducted as a shared visit with non-physician practitioner(s) and myself.  I personally evaluated the patient during the encounter    Daleen Bo, MD 06/24/21 1345

## 2021-06-22 NOTE — Discharge Instructions (Signed)
Follow-up with your primary care provider or your pulmonologist.  Return emergency department for any new or worsening symptoms.

## 2021-06-22 NOTE — ED Notes (Signed)
Pt has an injured right pointer finger. Says he had a fall and broke it a few days ago, did not have it checked out after the injury. Still complains of pain and swelling.

## 2021-06-22 NOTE — ED Provider Notes (Signed)
[facility] EMERGENCY DEPARTMENT Provider Note   CSN: [id number] Arrival date & time: 06/22/21  1233     History Chief Complaint  Patient presents with   Shortness of [name] [name] is a 75 y.o. male.   Shortness of Breath Associated symptoms: no abdominal pain, no chest pain, no cough, no fever, no headaches, no vomiting and no wheezing        KWASI JOUNG is a 76 y.o. male with past medical history of hypertension, pulmonary embolism and peripheral edema who presents to the Emergency Department complaining of sudden onset of shortness of breath after exertion.  Patient and family member states that he was shopping in [place] and became short of breath after walking from the entrance of [employer] to his vehicle.  His daughter states that he was "hyperventilating" while sitting in the vehicle.  She states his breathing was very rapid and he was complaining of pain all over.  Denies chest pain at that time.  He went to urgent care in [place] and EMS was called and he was transported here for further evaluation.  Upon exam, patient denying any symptoms at this time.  He is currently anticoagulated with Eliquis for bilateral PEs and DVTs.  Believed to be provoked by COVID from October 2021.  no history of heart disease.  No history of supplemental oxygen requirement.  He was recently seen by pulmonology on 06/11/2021. Hx of bilateral knee pain that limits his mobility, awaiting knee replacement surgery  He also reports pain and swelling to right index finger secondary to a fall several days ago.  Pain with movement.  Denies pain to wrist or numbness     Past Medical History:  Diagnosis Date   Colon polyp    GERD (gastroesophageal reflux disease)    Hemorrhoid    Hyperplasia of prostate    Hypertension    Other and unspecified hyperlipidemia    Other testicular hypofunction    Pneumonia    Pulmonary embolism (HCC)    Sinus bradycardia     Patient Active Problem  List   Diagnosis Date Noted   Encounter for anticoagulation discussion and counseling 06/11/2021   Lower extremity edema 04/23/2021   Hypokalemia 04/21/2021   Generalized weakness 04/21/2021   Elevated troponin 04/21/2021   Deep vein thrombosis (DVT) of left lower extremity ([place]) 04/09/2021   Benign essential hypertension in obstetric context 03/02/2021   Recurrent pulmonary emboli ([place]) 02/23/2021   Class 2 obesity due to excess calories with body mass index (BMI) of 36.0 to 36.9 in adult 02/23/2021   Osteoarthritis of left knee 01/21/2021   History of DVT (deep vein thrombosis) 08/15/2020   History of pulmonary embolus (PE) 05/31/2020   Osteoarthritis of right knee 05/05/2019   Aortic atherosclerosis ([place]) 03/07/2017   B12 deficiency 12/31/2015   Hyperlipidemia 07/01/2014   Erectile dysfunction 02/28/2014   Vitamin D deficiency 12/14/2012   Testosterone deficiency    Hypertension, essential    Sinus bradycardia    GERD (gastroesophageal reflux disease)    Colon polyp history    BPH (benign prostatic hyperplasia)     Past Surgical History:  Procedure Laterality Date   APPENDECTOMY     HAND SURGERY Right        Family History  Problem Relation Age of Onset   Heart attack Mother    Stroke Mother    Diabetes Father    Diabetes Daughter     Social History   Tobacco  Use   Smoking status: Former    Types: Cigars    Quit date: 2015    Years since quitting: 7.9   Smokeless tobacco: Current    Types: Chew   Tobacco comments:    only 1 cigar on occassion  Vaping Use   Vaping Use: Never used  Substance Use Topics   Alcohol use: Yes    Comment: daily, up to 1/2 pint   Drug use: No    Home Medications Prior to Admission medications   Medication Sig Start Date End Date Taking? Authorizing Provider  apixaban (ELIQUIS) 5 MG TABS tablet Take 1 tablet (5 mg total) by mouth 2 (two) times daily. 04/09/21  Yes Margaretha Seeds, MD  furosemide (LASIX) 20 MG tablet TAKE 1  TABLET BY MOUTH EVERY DAY 06/07/21  Yes Dettinger, Fransisca Kaufmann, MD  omeprazole (PRILOSEC) 20 MG capsule TAKE 1 CAPSULE (20 MG TOTAL) BY MOUTH DAILY. 04/10/21  Yes Dettinger, Fransisca Kaufmann, MD  vitamin B-12 (CYANOCOBALAMIN) 100 MCG tablet Take 100 mcg by mouth daily.   Yes [provider]  vitamin C (ASCORBIC ACID) 500 MG tablet Take 500 mg by mouth daily.   Yes [provider]  gabapentin (NEURONTIN) 100 MG capsule Take 1 capsule (100 mg total) by mouth at bedtime. Patient not taking: Reported on 06/11/2021 04/16/21   Dettinger, Fransisca Kaufmann, MD    Allergies    Cialis [tadalafil] and Viagra [sildenafil citrate]  Review of Systems   Review of Systems  Constitutional:  Negative for chills and fever.  Respiratory:  Positive for shortness of breath. Negative for cough, chest tightness and wheezing.   Cardiovascular:  Positive for leg swelling. Negative for chest pain.  Gastrointestinal:  Negative for abdominal pain, nausea and vomiting.  Genitourinary:  Negative for difficulty urinating.  Musculoskeletal:  Positive for arthralgias (bilateral knee pain, pain right index finger).  Skin:  Negative for color change.  Neurological:  Negative for dizziness, syncope, weakness and headaches.  Psychiatric/Behavioral:  Negative for confusion.   All other systems reviewed and are negative.  Physical Exam Updated Vital Signs BP (!) 150/89   Pulse 77   Temp 97.6 F (36.4 C) (Oral)   Resp (!) 23   Ht 5\' 6"  (1.676 m)   Wt 108.9 kg   SpO2 98%   BMI 38.74 kg/m   Physical Exam Vitals and nursing note reviewed.  Constitutional:      General: He is not in acute distress.    Appearance: He is well-developed. He is not toxic-appearing.  Eyes:     Extraocular Movements: Extraocular movements intact.     Conjunctiva/sclera: Conjunctivae normal.     Pupils: Pupils are equal, round, and reactive to light.  Cardiovascular:     Rate and Rhythm: Normal rate and regular rhythm.     Pulses: Normal  pulses.  Pulmonary:     Effort: Pulmonary effort is normal. No respiratory distress.     Breath sounds: Normal breath sounds. No wheezing or rales.  Abdominal:     Palpations: Abdomen is soft.     Tenderness: There is no abdominal tenderness.  Musculoskeletal:        General: Tenderness and signs of injury present.     Right lower leg: Edema present.     Left lower leg: Edema present.     Comments: Tenderness at the PIP joint of the right index finger.  Mild swelling and ecchymosis also noted.  Bony deformity.  Right wrist and hand are  nontender.  Nonpitting edema of the bilateral lower extremities.  No skin changes or calf tenderness.  Skin:    General: Skin is warm.     Capillary Refill: Capillary refill takes less than 2 seconds.     Findings: No erythema or rash.  Neurological:     General: No focal deficit present.     Mental Status: He is alert.     Sensory: No sensory deficit.     Motor: No weakness.    ED Results / Procedures / Treatments   Labs (all labs ordered are listed, but only abnormal results are displayed) Labs Reviewed  BASIC METABOLIC PANEL - Abnormal; Notable for the following components:      Result Value   Chloride 96 (*)    Glucose, Bld 111 (*)    Calcium 8.7 (*)    Anion gap 18 (*)    All other components within normal limits  CBC WITH DIFFERENTIAL/PLATELET - Abnormal; Notable for the following components:   MCH 34.5 (*)    Platelets 143 (*)    All other components within normal limits  PROTIME-INR - Abnormal; Notable for the following components:   Prothrombin Time 16.7 (*)    INR 1.4 (*)    All other components within normal limits  BRAIN NATRIURETIC PEPTIDE  TROPONIN I (HIGH SENSITIVITY)    EKG EKG Interpretation  Date/Time:  Friday June 22 2021 14:29:22 EST Ventricular Rate:  64 PR Interval:  169 QRS Duration: 98 QT Interval:  481 QTC Calculation: 497 R Axis:   41 Text Interpretation: Sinus rhythm Borderline low voltage,  extremity leads Borderline prolonged QT interval since last tracing no significant change Confirmed by Daleen Bo [phone]) on 06/22/2021 3:20:21 PM  Radiology  DG Chest Portable 1 View  Result Date: 06/22/2021 CLINICAL DATA:  Shortness of breath.  Generalized pain. EXAM: PORTABLE CHEST 1 VIEW COMPARISON:  Radiographs 04/21/2021 and 02/23/2021.  CT 02/23/2021. FINDINGS: 1434 hours. The heart size and mediastinal contours are stable. There is mild aortic atherosclerosis. The lungs are clear. There is no pleural effusion or pneumothorax. The bones appear unchanged. Telemetry leads overlie the chest. IMPRESSION: Stable chest.  No active cardiopulmonary process. Electronically Signed   By: Richardean Sale M.D.   On: 06/22/2021 14:55   DG Finger Index Right  Result Date: 06/22/2021 CLINICAL DATA:  Injury; right second digit pain swelling after fall EXAM: RIGHT INDEX FINGER 2+V COMPARISON:  None. FINDINGS: Alignment is anatomic. There is no acute fracture. Well corticated fragment volar to the base of the middle phalanx. Degenerative changes are present at the DIP joint. There are two small radiopaque foreign bodies along the radial base of the second digit. IMPRESSION: No acute fracture. Two small radiopaque foreign bodies along the radial base of the second digit. Electronically Signed   By: Macy Mis M.D.   On: 06/22/2021 14:56     Procedures Procedures   Medications Ordered in ED Medications - No data to display  ED Course  I have reviewed the triage vital signs and the nursing notes.  Pertinent labs & imaging results that were available during my care of the patient were reviewed by me and considered in my medical decision making (see chart for details).    MDM Rules/Calculators/A&P                           Patient here via EMS for evaluation of episode of exertional dyspnea.  Symptoms began  around noon today.  Resolved upon arrival.  History of bilateral PEs and DVTs, currently  anticoagulated on Eliquis.  Clots presumed to be secondary to COVID infection from October 2021.  He has history of chronic bilateral knee pain that limits his mobility.  Today symptoms were sudden onset after attempting to walk from the store to his vehicle.  No history of CHF, diabetes, or cardiac disease.  On exam, patient resting comfortably. Pitting peripheral edema noted bilaterally.  No calf tenderness or erythema.  No increased work of breathing.  No hypoxia.  Symptoms likely related to exertion, but given history will obtain labs, EKG, chest x-ray  XR's reassuring.  No fx of the finger.  Offered splint but patient refused.    Pt seen by Dr. Eulis Foster and care plan discussed.  Work up reassuring, labs unremarkable.  Likely episode prior to arrival was related to exertion   Patient appropriate for discharge home, agrees to close outpatient follow-up with PCP.  Return precautions were discussed.  Final Clinical Impression(s) / ED Diagnoses Final diagnoses:  Exertional dyspnea    Rx / DC Orders ED Discharge Orders     None        Kem Parkinson, PA-C 06/24/21 2333    Daleen Bo, MD 06/25/21 1047

## 2021-07-25 DIAGNOSIS — M170 Bilateral primary osteoarthritis of knee: Secondary | ICD-10-CM | POA: Diagnosis not present

## 2021-08-08 NOTE — Telephone Encounter (Signed)
Will close encounter

## 2021-08-20 ENCOUNTER — Ambulatory Visit (INDEPENDENT_AMBULATORY_CARE_PROVIDER_SITE_OTHER): Payer: Medicare HMO | Admitting: Family Medicine

## 2021-08-20 ENCOUNTER — Encounter: Payer: Self-pay | Admitting: Family Medicine

## 2021-08-20 VITALS — BP 140/89 | HR 68 | Ht 66.0 in | Wt 248.0 lb

## 2021-08-20 DIAGNOSIS — I10 Essential (primary) hypertension: Secondary | ICD-10-CM | POA: Diagnosis not present

## 2021-08-20 DIAGNOSIS — K219 Gastro-esophageal reflux disease without esophagitis: Secondary | ICD-10-CM | POA: Diagnosis not present

## 2021-08-20 DIAGNOSIS — E782 Mixed hyperlipidemia: Secondary | ICD-10-CM

## 2021-08-20 LAB — CMP14+EGFR
ALT: 19 IU/L (ref 0–44)
AST: 33 IU/L (ref 0–40)
Albumin/Globulin Ratio: 1.7 (ref 1.2–2.2)
Albumin: 3.9 g/dL (ref 3.7–4.7)
Alkaline Phosphatase: 60 IU/L (ref 44–121)
BUN/Creatinine Ratio: 11 (ref 10–24)
BUN: 10 mg/dL (ref 8–27)
Bilirubin Total: 0.7 mg/dL (ref 0.0–1.2)
CO2: 29 mmol/L (ref 20–29)
Calcium: 9.1 mg/dL (ref 8.6–10.2)
Chloride: 98 mmol/L (ref 96–106)
Creatinine, Ser: 0.95 mg/dL (ref 0.76–1.27)
Globulin, Total: 2.3 g/dL (ref 1.5–4.5)
Glucose: 109 mg/dL — ABNORMAL HIGH (ref 70–99)
Potassium: 4 mmol/L (ref 3.5–5.2)
Sodium: 140 mmol/L (ref 134–144)
Total Protein: 6.2 g/dL (ref 6.0–8.5)
eGFR: 82 mL/min/{1.73_m2} (ref >59)

## 2021-08-20 LAB — CBC WITH DIFFERENTIAL/PLATELET
Basophils Absolute: 0.1 10*3/uL (ref 0.0–0.2)
Basos: 1 %
EOS (ABSOLUTE): 0.2 10*3/uL (ref 0.0–0.4)
Eos: 3 %
Hematocrit: 43 % (ref 37.5–51.0)
Hemoglobin: 14.9 g/dL (ref 13.0–17.7)
Immature Grans (Abs): 0 10*3/uL (ref 0.0–0.1)
Immature Granulocytes: 0 %
Lymphocytes Absolute: 2.1 10*3/uL (ref 0.7–3.1)
Lymphs: 32 %
MCH: 34.3 pg — ABNORMAL HIGH (ref 26.6–33.0)
MCHC: 34.7 g/dL (ref 31.5–35.7)
MCV: 99 fL — ABNORMAL HIGH (ref 79–97)
Monocytes Absolute: 0.6 10*3/uL (ref 0.1–0.9)
Monocytes: 8 %
Neutrophils Absolute: 3.9 10*3/uL (ref 1.4–7.0)
Neutrophils: 56 %
Platelets: 185 10*3/uL (ref 150–450)
RBC: 4.34 x10E6/uL (ref 4.14–5.80)
RDW: 11.9 % (ref 11.6–15.4)
WBC: 6.8 10*3/uL (ref 3.4–10.8)

## 2021-08-20 LAB — LIPID PANEL
Chol/HDL Ratio: 4.2 ratio (ref 0.0–5.0)
Cholesterol, Total: 204 mg/dL — ABNORMAL HIGH (ref 100–199)
HDL: 49 mg/dL (ref >39)
LDL Chol Calc (NIH): 128 mg/dL — ABNORMAL HIGH (ref 0–99)
Triglycerides: 151 mg/dL — ABNORMAL HIGH (ref 0–149)
VLDL Cholesterol Cal: 27 mg/dL (ref 5–40)

## 2021-08-20 MED ORDER — OMEPRAZOLE 20 MG PO CPDR: 20.0000 mg | DELAYED_RELEASE_CAPSULE | Freq: Every day | ORAL | 3 refills | Status: DC

## 2021-08-20 NOTE — Progress Notes (Signed)
BP 140/89    Pulse 68    Ht '5\' 6"'  (1.676 m)    Wt 248 lb (112.5 kg)    SpO2 96%    BMI 40.03 kg/m    Subjective:   Patient ID: [name] [name], male    DOB: Aug 18, 1945, 76 y.o.   MRN: [id number]  HPI: [name] [name] is a 76 y.o. male presenting on 08/20/2021 for Medical Management of Chronic Issues and Hypertension   HPI Hypertension Patient is currently on furosemide as needed, and their blood pressure today is 140/89. Patient denies any lightheadedness or dizziness. Patient denies headaches, blurred vision, chest pains, shortness of breath, or weakness. Denies any side effects from medication and is content with current medication.   Hyperlipidemia Patient is coming in for recheck of his hyperlipidemia. The patient is currently taking no medication currently, has been diet controlled, will monitor and check blood work today. They deny any issues with myalgias or history of liver damage from it. They deny any focal numbness or weakness or chest pain.   GERD Patient is currently on omeprazole.  She denies any major symptoms or abdominal pain or belching or burping. She denies any blood in her stool or lightheadedness or dizziness.  He says if he misses a dose he notices it but as long as he stays on that he is fine.  Relevant past medical, surgical, family and social history reviewed and updated as indicated. Interim medical history since our last visit reviewed. Allergies and medications reviewed and updated.  Review of Systems  Constitutional:  Negative for chills and fever.  Eyes:  Negative for visual disturbance.  Respiratory:  Negative for shortness of breath and wheezing.   Cardiovascular:  Negative for chest pain and leg swelling.  Musculoskeletal:  Positive for arthralgias and back pain. Negative for gait problem.  Skin:  Negative for rash.  All other systems reviewed and are negative.  Per HPI unless specifically indicated above   Allergies as of 08/20/2021        Reactions   Cialis [tadalafil] Other (See Comments)   Not allergic 02/23/21   Viagra [sildenafil Citrate] Other (See Comments)   heartburn        Medication List        Accurate as of August 20, 2021  8:52 AM. If you have any questions, ask your nurse or doctor.          STOP taking these medications    gabapentin 100 MG capsule Commonly known as: NEURONTIN Stopped by: Fransisca Kaufmann Rima Blizzard, MD       TAKE these medications    apixaban 5 MG Tabs tablet Commonly known as: ELIQUIS Take 1 tablet (5 mg total) by mouth 2 (two) times daily.   furosemide 20 MG tablet Commonly known as: LASIX TAKE 1 TABLET BY MOUTH EVERY DAY   omeprazole 20 MG capsule Commonly known as: PRILOSEC Take 1 capsule (20 mg total) by mouth daily.   vitamin B-12 100 MCG tablet Commonly known as: CYANOCOBALAMIN Take 100 mcg by mouth daily.   vitamin C 500 MG tablet Commonly known as: ASCORBIC ACID Take 500 mg by mouth daily.         Objective:   BP 140/89    Pulse 68    Ht '5\' 6"'  (1.676 m)    Wt 248 lb (112.5 kg)    SpO2 96%    BMI 40.03 kg/m   Wt Readings from Last 3 Encounters:  08/20/21 248 lb (  112.5 kg)  06/22/21 240 lb (108.9 kg)  06/11/21 238 lb (108 kg)    Physical Exam Vitals and nursing note reviewed.  Constitutional:      General: He is not in acute distress.    Appearance: He is well-developed. He is not diaphoretic.  Eyes:     General: No scleral icterus.    Conjunctiva/sclera: Conjunctivae normal.  Neck:     Thyroid: No thyromegaly.  Cardiovascular:     Rate and Rhythm: Normal rate and regular rhythm.     Heart sounds: Normal heart sounds. No murmur heard. Pulmonary:     Effort: Pulmonary effort is normal. No respiratory distress.     Breath sounds: Normal breath sounds. No wheezing.  Musculoskeletal:        General: Swelling (2+ bilateral) present. Normal range of motion.     Cervical back: Neck supple.  Lymphadenopathy:     Cervical: No cervical adenopathy.   Skin:    General: Skin is warm and dry.     Findings: No rash.  Neurological:     Mental Status: He is alert and oriented to person, place, and time.     Coordination: Coordination normal.  Psychiatric:        Behavior: Behavior normal.      Assessment & Plan:   Problem List Items Addressed This Visit       Cardiovascular and Mediastinum   Hypertension, essential - Primary   Relevant Orders   CMP14+EGFR     Digestive   GERD (gastroesophageal reflux disease)   Relevant Medications   omeprazole (PRILOSEC) 20 MG capsule   Other Relevant Orders   CBC with Differential/Platelet     Other   Hyperlipidemia   Relevant Orders   Lipid panel  Patient's arthritis in back and knees and gets stiff but it has been stable. History of DVTs and PEs, still takes Eliquis.  Denies any bruising or bleeding  Allowing permissive hypertension.  140/89 today Follow up plan: Return in about 6 months (around 02/17/2022), or if symptoms worsen or fail to improve, for Hypertension hyperlipidemia.  Counseling provided for all of the vaccine components Orders Placed This Encounter  Procedures   CBC with Differential/Platelet   CMP14+EGFR   Lipid panel    Caryl Pina, MD [facility] 08/20/2021, 8:52 AM

## 2021-08-29 DIAGNOSIS — M170 Bilateral primary osteoarthritis of knee: Secondary | ICD-10-CM | POA: Diagnosis not present

## 2021-08-30 ENCOUNTER — Other Ambulatory Visit: Payer: Self-pay

## 2021-08-30 MED ORDER — ROSUVASTATIN CALCIUM 10 MG PO TABS: 10.0000 mg | ORAL_TABLET | Freq: Every day | ORAL | 3 refills | Status: DC

## 2021-09-03 DIAGNOSIS — H5213 Myopia, bilateral: Secondary | ICD-10-CM | POA: Diagnosis not present

## 2021-09-03 DIAGNOSIS — H524 Presbyopia: Secondary | ICD-10-CM | POA: Diagnosis not present

## 2021-09-05 ENCOUNTER — Other Ambulatory Visit: Payer: Self-pay | Admitting: Family Medicine

## 2021-09-05 DIAGNOSIS — M170 Bilateral primary osteoarthritis of knee: Secondary | ICD-10-CM | POA: Diagnosis not present

## 2021-09-13 DIAGNOSIS — M170 Bilateral primary osteoarthritis of knee: Secondary | ICD-10-CM | POA: Diagnosis not present

## 2021-10-11 ENCOUNTER — Other Ambulatory Visit: Payer: Self-pay | Admitting: Family Medicine

## 2021-10-18 DIAGNOSIS — M1711 Unilateral primary osteoarthritis, right knee: Secondary | ICD-10-CM | POA: Diagnosis not present

## 2021-10-22 ENCOUNTER — Telehealth: Payer: Self-pay | Admitting: Pulmonary Disease

## 2021-10-22 NOTE — Telephone Encounter (Signed)
Fax received from Dr. Gaynelle Arabian with [facility] to perform a Right total knee arthroplasty on patient.  Patient needs surgery clearance. Patient was seen on 06/11/2021. Patient has been scheduled for 11/29/2021. Office protocol is a risk assessment can be sent to surgeon if patient has been seen in 60 days or less.  ? ?Sending to Dr. Loanne Drilling for risk assessment or recommendations if patient needs to be seen in office prior to surgical procedure.   ?

## 2021-10-24 NOTE — Telephone Encounter (Signed)
Agree with OV. Patient scheduled for surgery in August 2023. ?

## 2021-11-29 ENCOUNTER — Encounter: Payer: Self-pay | Admitting: Pulmonary Disease

## 2021-11-29 ENCOUNTER — Ambulatory Visit: Payer: Medicare HMO | Admitting: Pulmonary Disease

## 2021-11-29 VITALS — BP 128/76 | HR 65 | Temp 97.9°F | Ht 67.0 in | Wt 247.2 lb

## 2021-11-29 DIAGNOSIS — Z7189 Other specified counseling: Secondary | ICD-10-CM

## 2021-11-29 DIAGNOSIS — I2699 Other pulmonary embolism without acute cor pulmonale: Secondary | ICD-10-CM

## 2021-11-29 NOTE — Patient Instructions (Addendum)
Recurrent pulmonary embolism/LLE DVT ?--Continue Eliquis 5 mg twice a day  ?--Prior to surgery, hold Eliquis for 48 hours. Anticoagulation can be resumed after hemostasis is achieved per Surgery. At the latest, anticoagulation needs to be restarted 24 hours after surgery. ? ?Peri-operative Assessment of Pulmonary Risk for Non-Thoracic Surgery: ? ?For [name] [name], risk of perioperative pulmonary complications is increased by: ? Age greater than 44 years ?  ?Respiratory complications generally occur in 1% of ASA Class I patients, 5% of ASA Class II and 10% of ASA Class III-IV patients These complications rarely result in mortality and include postoperative pneumonia, atelectasis, pulmonary embolism, ARDS and increased time requiring postoperative mechanical ventilation. ? ?ARISCAT Low Risk with 1.6% risk of in-hospital post-op complications (composite including respiratory failure, respiratory infection, pleural effusion, atelectasis, pneumothorax, bronchospasm, aspiration pneumonitis)  ? ?Overall, I recommend proceeding with the surgery if the risk for respiratory complications are outweighed by the potential benefits. This will need to be discussed between the patient and surgeon. ? ?To reduce risks of respiratory complications, I recommend: ?--Pre- and post-operative incentive spirometry performed frequently while awake ? ?I have discussed the risk factors and recommendations above with the patient. ? ?Follow-up with me in 6 months ?

## 2021-11-29 NOTE — Progress Notes (Signed)
? ? ?Subjective:  ? ?PATIENT ID: [name] [name] GENDER: male DOB: 10-01-1945, MRN: [id number] ? ? ?HPI ? ?Chief Complaint  ?Patient presents with  ? Follow-up  ?  Sx clearance for knee sx August 2023. No complaints otherwise.  ? ? ?Reason for Visit: Follow-up, pre-op evaluation ? ?[name] is a 76 year old male with history of pulmonary embolism who presents for follow-up. ? ?Summary ?He was previously seen by me for pulmonary embolism and bilateral DVTs presumed to be provoked by COVID-19 in October 2021. He completed 6 months of anticoagulation in April 2022. Unfortunately he was readmitted last month in July 2022 for acute submassive PE that was unprovoked. Pulmonary inpatient note by Dr. Shearon [name] on 02/23/21 reviewed. He was treated with heparin and transitioned to Eliquis. No oxygen requirement. Denies bleeding issues. Minimal bruising.  Has shortness of breath with exertion. He does not ambulate at all at home, stating he is very sedentary. ? ?06/11/2021 ?Since his last visit, he had lower extremity swelling which was treated with additional Lasix.  Unfortunately he was hospitalized briefly for hypokalemia.  Has not had any lower extremity recently. Reports shortness of breath with exertion however has better stamina compared to last time. Denies wheezing or cough. Reports he is able to ambulate short distances in the house. 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 He is not dedicating daily time to aerobic activity. He feels his activity is mainly limited to his knee pain. Would like to discuss with his doctors regarding knee surgery. ? ?11/29/21 ?He presents for pre-op evaluation. No current complaints except for lower leg swelling that worsens with sitting or standing too long and improves with elevation. 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 He walks back and forth in the house but  remains mainly sedentary due to his right knee pain. When he does walk he is short of breath. Denies cough or wheezing. ? 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 ?Past Medical History:  ?Diagnosis Date  ? Colon polyp   ? GERD (gastroesophageal reflux disease)   ? Hemorrhoid   ? Hyperplasia of prostate   ? Hypertension   ? Other and unspecified hyperlipidemia   ? Other testicular hypofunction   ? Pneumonia   ? Pulmonary embolism ([place])   ? Sinus bradycardia   ?  ? ?Outpatient Medications Prior to Visit  ?Medication Sig Dispense Refill  ? apixaban (ELIQUIS) 5 MG TABS tablet Take 1 tablet (5 mg total) by mouth 2 (two) times daily. 180 tablet 3  ? furosemide (LASIX) 20 MG tablet TAKE 1 TABLET BY MOUTH EVERY DAY 30 tablet 4  ? omeprazole (PRILOSEC) 20 MG capsule TAKE 1 CAPSULE (20 MG TOTAL) BY MOUTH DAILY. 90 capsule 1  ? rosuvastatin (CRESTOR) 10 MG tablet Take 1 tablet (10 mg total) by mouth daily. 90 tablet 3  ? vitamin B-12 (CYANOCOBALAMIN) 100 MCG tablet Take 100 mcg by mouth daily.    ? vitamin C (ASCORBIC ACID) 500 MG tablet Take 500 mg by mouth daily.    ? ?No facility-administered medications prior to visit.  ? ? ?Review of Systems  ?Constitutional:  Negative for chills, diaphoresis, fever, malaise/fatigue and weight loss.  ?HENT:  Negative for congestion.   ?Respiratory:  Positive for shortness of breath. Negative for cough, hemoptysis, sputum production and wheezing.   ?Cardiovascular:  Negative for chest pain, palpitations and leg swelling.  ?Musculoskeletal:  Positive for joint pain.  ? ? ?Objective:  ? ?Vitals:  ? 11/29/21 0853  ?BP: 128/76  ?Pulse: 65  ?Temp: 97.9 ?F (36.6 ?C)  ?TempSrc: Oral  ?SpO2: 95%  ?Weight: 247 lb 3.2 oz (112.1 kg)  ?Height: '5\' 7"'$  (1.702 m)  ? ?SpO2: 95 % (RA) ? ?Physical Exam: ?General: Well-appearing, no acute distress ?HENT: [place], AT ?Eyes: EOMI, no scleral icterus ?Respiratory: Clear to auscultation bilaterally.  No crackles, wheezing or rales ?Cardiovascular: RRR, -M/R/G, no  JVD ?Extremities:-Edema,-tenderness ?Neuro: AAO x4, CNII-XII grossly intact ?Psych: Normal mood, normal affect ? ?Data Reviewed: ? ?Imaging: ?CTA 05/31/20 - Acute PE bilaterally. GGO R>L suggestive of pulmonary infarct ?LE Dopplers 06/01/20 - Left DVT involving popliteal, posterior tibial and peroneal veins ? ?CTA 02/23/21 - Large bilateral PE with right heart strain ?Bilateral LE Dopplers 02/23/21  ?- Acute deep vein thrombosis involving the right common femoral vein, SF junction, right popliteal vein, right posterior tibial veins, and right peroneal veins. ?- Partial thrombus noted in the external iliac through to, at least, the mid common iliac. Unable to visualize the distal common iliac or the IVC secondary to bowel gas. ? ?CXR 04/21/21 - Unchanged subcentimeter calcified granuloma ? ?CXR 06/22/21 - No acute issues ? ?PFT: ?None on file ? ?Echocardiogram: ?05/07/2021 - EF 60 to 65%.  No WMA.  Normal RV size.  No valvular abnormalities. ?   ?Assessment & Plan:  ? ?Discussion: ?76 year old male with recurrent pulmonary embolism/DVT requiring lifelong anticoagulation, HTN, HLD, GERD who presents for follow-up. Tolerating apixaban without significant issues. Planning for elective knee arthroplasty in August 2023. Discussed anticoagulation plan as noted below. He is at risk for recurrent VTE and will need to minimize time off of anticoagulation. ? ?Recurrent pulmonary embolism/LLE DVT ?--Continue Eliquis 5 mg twice a day  ?--Prior to surgery, hold Eliquis for 48 hours. Anticoagulation can be resumed after hemostasis is achieved per Surgery. At the latest, anticoagulation needs to be restarted 24 hours after surgery. ? ?Peri-operative Assessment of Pulmonary Risk for Non-Thoracic Surgery: ? ?For [name], risk of perioperative pulmonary complications is increased by: ? Age greater than 45 years ?  ?Respiratory complications generally occur in 1% of ASA Class I patients, 5% of ASA Class II and 10% of ASA Class III-IV  patients These complications rarely result in mortality and include postoperative pneumonia, atelectasis, pulmonary embolism, ARDS and increased time requiring postoperative mechanical ventilation. ? ?ARISCAT Low Risk with 1.6% risk of in-hospital post-op complications (composite including respiratory failure, respiratory infection, pleural effusion, atelectasis, pneumothorax, bronchospasm, aspiration pneumonitis)  ? ?Overall, I recommend proceeding with the surgery if the risk for respiratory complications are outweighed by the potential benefits. This will need to be discussed between the patient and surgeon. ? ?To reduce risks of respiratory complications, I recommend: ?--Pre- and post-operative incentive spirometry performed frequently while awake ? ?I have discussed the risk factors and recommendations above with the patient. ? ? ?Health Maintenance ?Immunization History  ?Administered Date(s) Administered  ? Pneumococcal Conjugate-13 05/03/2013  ? Pneumococcal Polysaccharide-23 07/29/2009  ? Td 07/30/2003  ? ?CT Lung Screen - not indicated ? ?No orders of the defined types were placed in this encounter. ? ?No orders of the defined types were placed in this encounter. ? ? ?Return in about 6 months (around 06/01/2022). ? ?I have spent a total time of 35-minutes on the day of the appointment including chart review, data review, collecting history, coordinating care and discussing medical diagnosis and plan with the  patient/family. Past medical history, allergies, medications were reviewed. Pertinent imaging, labs and tests included in this note have been reviewed and interpreted independently by me. ? ?Jazia Faraci Rodman Pickle, MD ?[facility] ?11/29/2021 10:10 AM  ?Office Number [phone] ? ? ?

## 2021-12-03 ENCOUNTER — Ambulatory Visit (INDEPENDENT_AMBULATORY_CARE_PROVIDER_SITE_OTHER): Payer: Medicare HMO | Admitting: Family Medicine

## 2021-12-03 ENCOUNTER — Encounter: Payer: Self-pay | Admitting: Family Medicine

## 2021-12-03 VITALS — BP 122/67 | HR 59 | Ht 67.0 in | Wt 246.0 lb

## 2021-12-03 DIAGNOSIS — Z01810 Encounter for preprocedural cardiovascular examination: Secondary | ICD-10-CM | POA: Diagnosis not present

## 2021-12-03 LAB — COAGUCHEK XS/INR WAIVED
INR: 1.1 (ref 0.9–1.1)
Prothrombin Time: 13.1 s

## 2021-12-03 NOTE — Progress Notes (Signed)
? ?BP 122/67   Pulse (!) 59   Ht '5\' 7"'  (1.702 m)   Wt 246 lb (111.6 kg)   SpO2 93%   BMI 38.53 kg/m?   ? ?Subjective:  ? ?Patient ID: [name] [name], male    DOB: 12-25-1945, 76 y.o.   MRN: [id number] ? ?HPI: ?[name] [name] is a 76 y.o. male presenting on 12/03/2021 for Surgical clearance (Right knee replacement-Alusio) ? ? ?HPI ?Patient is coming in today for preoperative physical exam.  He is having a right total knee arthroplasty with Dr. Wynelle Link, [facility].  He does have a pulmonologist because of his recurrent pulmonary embolism and his pulmonologist does say that he is low risk from a respiratory standpoint for surgery and is essentially clear to him.  He does have difficulty with ambulating currently but denies getting shortness of breath when he walks, mainly admits to stopping because of the pain.  He was able to walk him here to the office without getting shortness of breath.  His blood pressure and heart rate and oxygen saturation looks good.  He does currently have a BMI of 38.72 and we will check his blood work.  Patient is not a current smoker and quit in 2015.  He does get some leg swelling that is dependent, when he is on his feet more or sitting up more with his feet down. ? ?Relevant past medical, surgical, family and social history reviewed and updated as indicated. Interim medical history since our last visit reviewed. ?Allergies and medications reviewed and updated. ? ?Review of Systems  ?Constitutional:  Negative for chills and fever.  ?Eyes:  Negative for visual disturbance.  ?Respiratory:  Negative for shortness of breath and wheezing.   ?Cardiovascular:  Negative for chest pain and leg swelling.  ?Musculoskeletal:  Positive for arthralgias and gait problem. Negative for back pain.  ?Skin:  Negative for rash.  ?All other systems reviewed and are negative. ? ?Per HPI unless specifically indicated above ? ? ?Allergies as of 12/03/2021   ? ?   Reactions  ? Cialis [tadalafil] Other (See  Comments)  ? Not allergic 02/23/21  ? Viagra [sildenafil Citrate] Other (See Comments)  ? heartburn  ? ?  ? ?  ?Medication List  ?  ? ?  ? Accurate as of Dec 03, 2021  2:56 PM. If you have any questions, ask your nurse or doctor.  ?  ?  ? ?  ? ?apixaban 5 MG Tabs tablet ?Commonly known as: ELIQUIS ?Take 1 tablet (5 mg total) by mouth 2 (two) times daily. ?  ?furosemide 20 MG tablet ?Commonly known as: LASIX ?TAKE 1 TABLET BY MOUTH EVERY DAY ?  ?omeprazole 20 MG capsule ?Commonly known as: PRILOSEC ?TAKE 1 CAPSULE (20 MG TOTAL) BY MOUTH DAILY. ?  ?rosuvastatin 10 MG tablet ?Commonly known as: Crestor ?Take 1 tablet (10 mg total) by mouth daily. ?  ?vitamin B-12 100 MCG tablet ?Commonly known as: CYANOCOBALAMIN ?Take 100 mcg by mouth daily. ?  ?vitamin C 500 MG tablet ?Commonly known as: ASCORBIC ACID ?Take 500 mg by mouth daily. ?  ? ?  ? ? ? ?Objective:  ? ?BP 122/67   Pulse (!) 59   Ht '5\' 7"'  (1.702 m)   Wt 246 lb (111.6 kg)   SpO2 93%   BMI 38.53 kg/m?   ?Wt Readings from Last 3 Encounters:  ?12/03/21 246 lb (111.6 kg)  ?11/29/21 247 lb 3.2 oz (112.1 kg)  ?08/20/21 248 lb (112.5 kg)  ?  ?  Physical Exam ?Vitals and nursing note reviewed.  ?Constitutional:   ?   General: He is not in acute distress. ?   Appearance: He is well-developed. He is not diaphoretic.  ?Eyes:  ?   General: No scleral icterus. ?   Conjunctiva/sclera: Conjunctivae normal.  ?Neck:  ?   Thyroid: No thyromegaly.  ?Cardiovascular:  ?   Rate and Rhythm: Normal rate and regular rhythm.  ?   Heart sounds: Normal heart sounds. No murmur heard. ?Pulmonary:  ?   Effort: Pulmonary effort is normal. No respiratory distress.  ?   Breath sounds: Normal breath sounds. No wheezing.  ?Musculoskeletal:  ?   Cervical back: Neck supple.  ?Lymphadenopathy:  ?   Cervical: No cervical adenopathy.  ?Skin: ?   General: Skin is warm and dry.  ?   Findings: No rash.  ?Neurological:  ?   Mental Status: He is alert and oriented to person, place, and time.  ?    Coordination: Coordination normal.  ?Psychiatric:     ?   Behavior: Behavior normal.  ? ? ?EKG: Sinus bradycardia with a heart rate of 57 no ST or T wave abnormalities. ? ?Assessment & Plan:  ? ?Problem List Items Addressed This Visit   ?None ?Visit Diagnoses   ? ? Preop cardiovascular exam    -  Primary  ? Relevant Orders  ? EKG 12-Lead (Completed)  ? CBC with Differential/Platelet  ? CMP14+EGFR  ? CoaguChek XS/INR Waived  ? ?  ?  ?As long as blood work comes back and looks good then the EKG also looked good, he will be cleared for surgery as long as everything along those lines looks good. ? ?Low risk based on what pulmonology says and cardiovascular. ?Follow up plan: ?Return if symptoms worsen or fail to improve. ? ?Counseling provided for all of the vaccine components ?Orders Placed This Encounter  ?Procedures  ? CBC with Differential/Platelet  ? CMP14+EGFR  ? CoaguChek XS/INR Waived  ? EKG 12-Lead  ? ? ?Caryl Pina, MD ?[facility] ?12/03/2021, 2:56 PM ? ? ? ? ?

## 2021-12-04 LAB — CBC WITH DIFFERENTIAL/PLATELET
Basophils Absolute: 0 10*3/uL (ref 0.0–0.2)
Basos: 1 %
EOS (ABSOLUTE): 0.3 10*3/uL (ref 0.0–0.4)
Eos: 3 %
Hematocrit: 41.3 % (ref 37.5–51.0)
Hemoglobin: 14.6 g/dL (ref 13.0–17.7)
Immature Grans (Abs): 0 10*3/uL (ref 0.0–0.1)
Immature Granulocytes: 0 %
Lymphocytes Absolute: 2.5 10*3/uL (ref 0.7–3.1)
Lymphs: 32 %
MCH: 34.6 pg — ABNORMAL HIGH (ref 26.6–33.0)
MCHC: 35.4 g/dL (ref 31.5–35.7)
MCV: 98 fL — ABNORMAL HIGH (ref 79–97)
Monocytes Absolute: 0.6 10*3/uL (ref 0.1–0.9)
Monocytes: 7 %
Neutrophils Absolute: 4.5 10*3/uL (ref 1.4–7.0)
Neutrophils: 57 %
Platelets: 174 10*3/uL (ref 150–450)
RBC: 4.22 x10E6/uL (ref 4.14–5.80)
RDW: 12.7 % (ref 11.6–15.4)
WBC: 7.9 10*3/uL (ref 3.4–10.8)

## 2021-12-04 LAB — CMP14+EGFR
ALT: 12 IU/L (ref 0–44)
AST: 21 IU/L (ref 0–40)
Albumin/Globulin Ratio: 1.5 (ref 1.2–2.2)
Albumin: 3.9 g/dL (ref 3.7–4.7)
Alkaline Phosphatase: 56 IU/L (ref 44–121)
BUN/Creatinine Ratio: 12 (ref 10–24)
BUN: 11 mg/dL (ref 8–27)
Bilirubin Total: 0.5 mg/dL (ref 0.0–1.2)
CO2: 29 mmol/L (ref 20–29)
Calcium: 9.5 mg/dL (ref 8.6–10.2)
Chloride: 99 mmol/L (ref 96–106)
Creatinine, Ser: 0.92 mg/dL (ref 0.76–1.27)
Globulin, Total: 2.6 g/dL (ref 1.5–4.5)
Glucose: 108 mg/dL — ABNORMAL HIGH (ref 70–99)
Potassium: 3.9 mmol/L (ref 3.5–5.2)
Sodium: 141 mmol/L (ref 134–144)
Total Protein: 6.5 g/dL (ref 6.0–8.5)
eGFR: 86 mL/min/{1.73_m2} (ref >59)

## 2021-12-06 NOTE — Telephone Encounter (Signed)
Pt OV notes and clearance form have been faxed back to [facility]. Nothing further needed at this time. ?

## 2021-12-08 ENCOUNTER — Other Ambulatory Visit: Payer: Self-pay | Admitting: Family Medicine

## 2021-12-10 ENCOUNTER — Telehealth: Payer: Self-pay

## 2021-12-10 NOTE — Telephone Encounter (Signed)
Surgical Clearance, notes, EKG, labs faxed to [facility] at [phone] ?

## 2021-12-18 ENCOUNTER — Other Ambulatory Visit: Payer: Self-pay | Admitting: Family Medicine

## 2021-12-19 MED ORDER — TADALAFIL 5 MG PO TABS: 5.0000 mg | ORAL_TABLET | Freq: Every day | ORAL | 3 refills | Status: DC

## 2021-12-19 NOTE — Telephone Encounter (Signed)
Pt is asking for a refill of Cialis. Believe it was denied because it is not on his current med list.  Ok to refill

## 2021-12-19 NOTE — Addendum Note (Signed)
Addended by: Alphonzo Dublin on: 12/19/2021 04:00 PM   Modules accepted: Orders

## 2021-12-19 NOTE — Telephone Encounter (Signed)
Pt called regarding denial on his Med Refill.   Pt says he just talked to Dr Warrick Parisian about this and was told to call pharmacy and get them to send the refill request and he would send refills in.  Says he was not taken off of the medicine and if he was, he didn't know about it because hes been taking it up until last month.  Please advise and let pt know.

## 2022-01-11 IMAGING — CT CT ANGIO CHEST
2 of 8 series · 18 of 36 positions shown · IV contrast (Omnipaque)
Comparison: CTA chest May 31, 2020.

CLINICAL DATA: PE suspected, high probability. Abdominal pain after
eating x1 0.5 months.

EXAM:
CT ANGIOGRAPHY CHEST WITH CONTRAST
TECHNIQUE: Multidetector CT imaging of the chest was performed using the
standard protocol during bolus administration of intravenous
contrast. Multiplanar CT image reconstructions and MIPs were
obtained to evaluate the vascular anatomy.
CONTRAST:  100mL OMNIPAQUE IOHEXOL 350 MG/ML SOLN

[Series 6: pe thins · axial · 0.77mm/px · z∈[+1205,+1476]mm · 17 of 303 slices shown]
[im 16/303  lung]
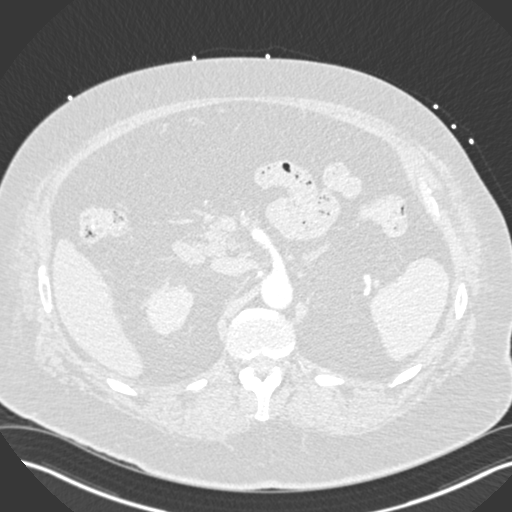
[im 32/303  mediastinal]
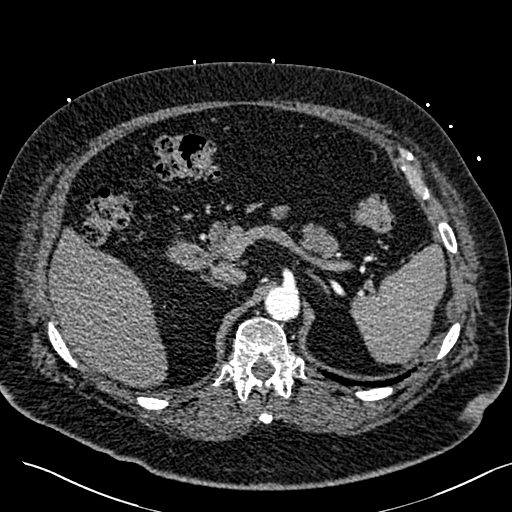
[im 48/303  lung]
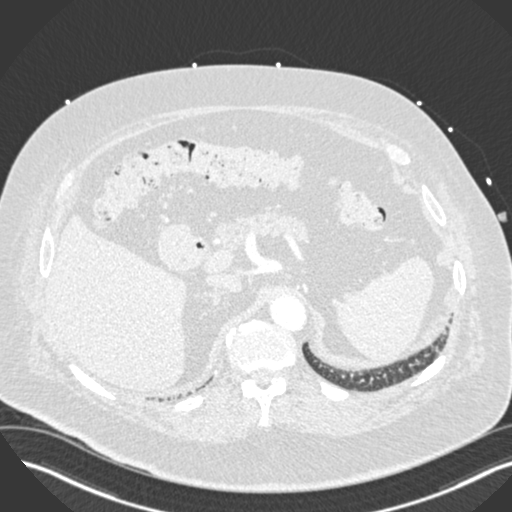
[im 64/303  mediastinal]
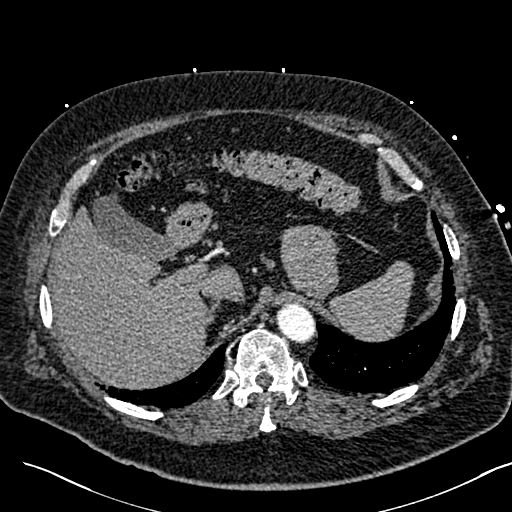
[im 80/303  lung]
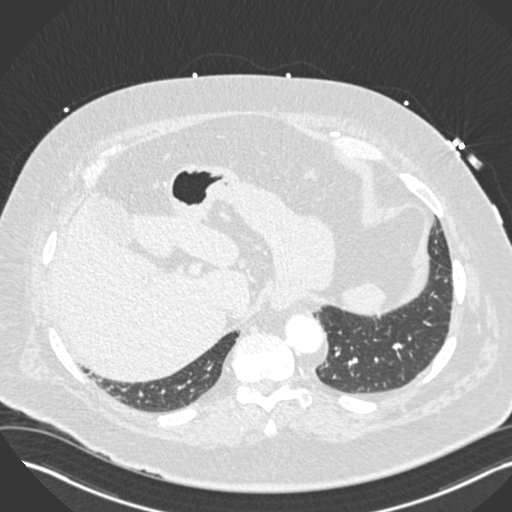
[im 96/303  mediastinal]
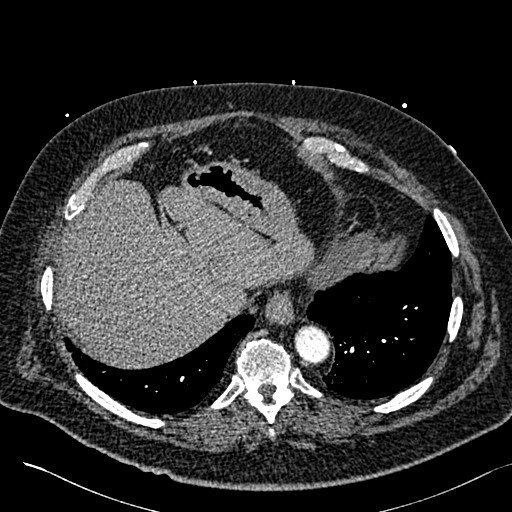
[im 112/303  lung]
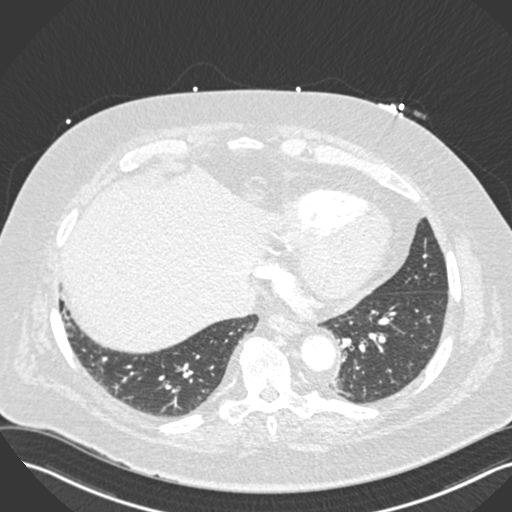
[im 128/303  mediastinal]
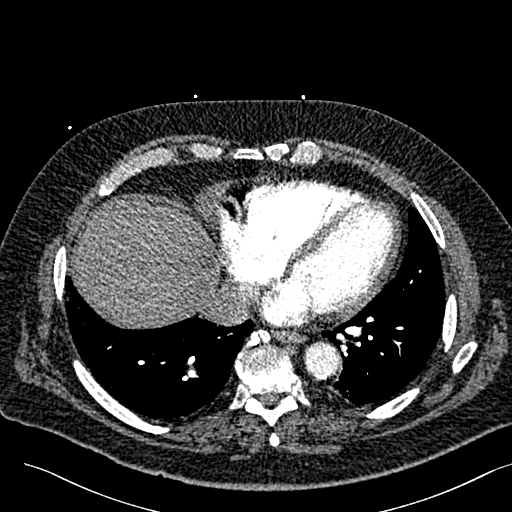
[im 159/303  lung]
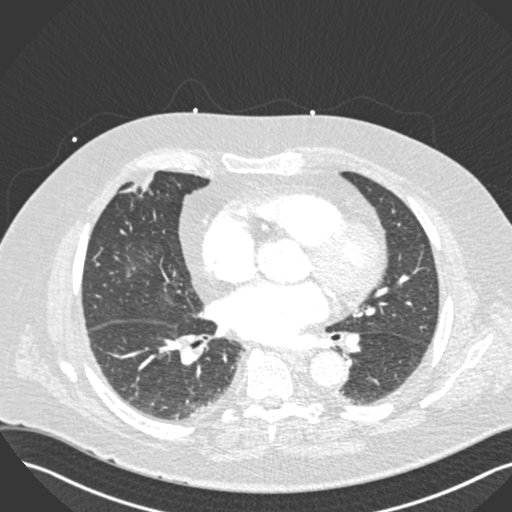
[im 175/303  mediastinal]
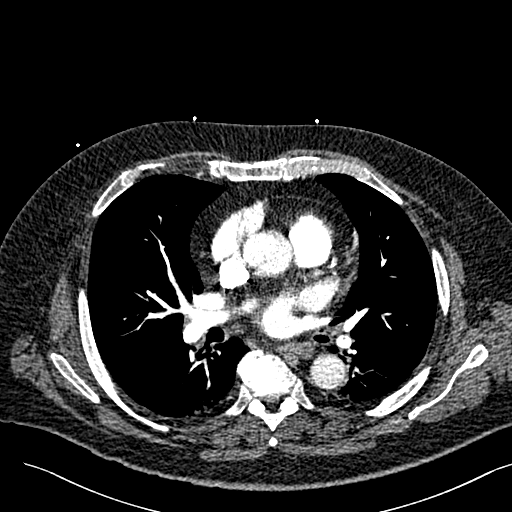
[im 191/303  lung]
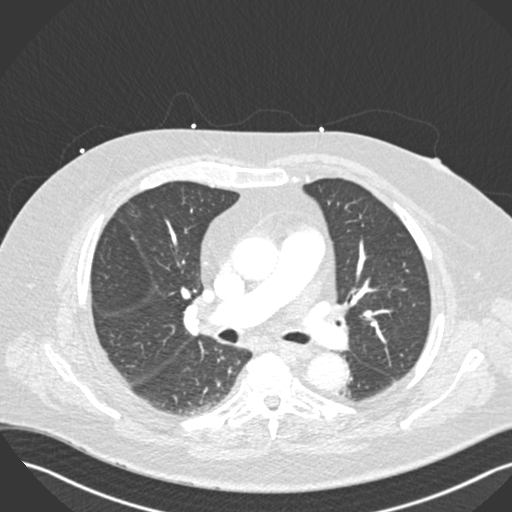
[im 207/303  mediastinal]
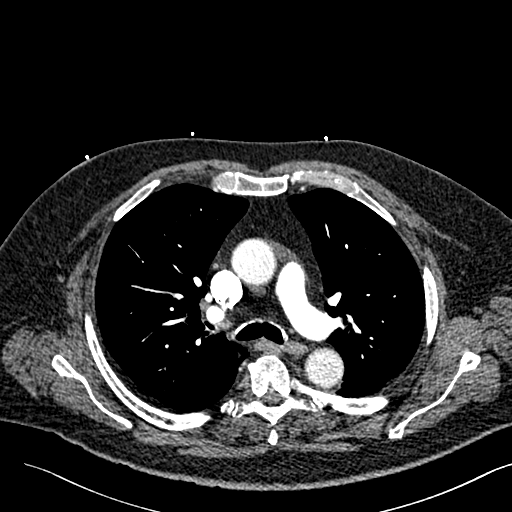
[im 223/303  lung]
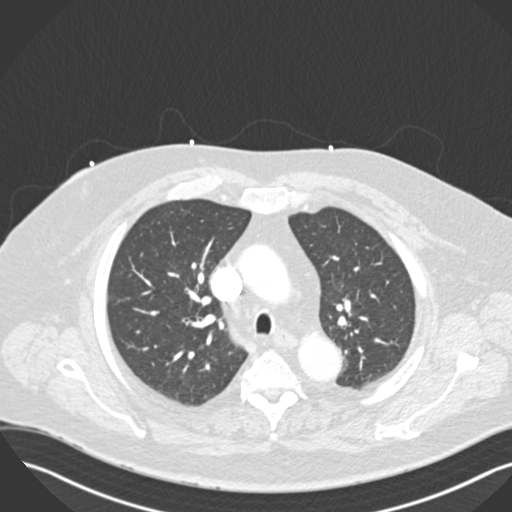
[im 239/303  mediastinal]
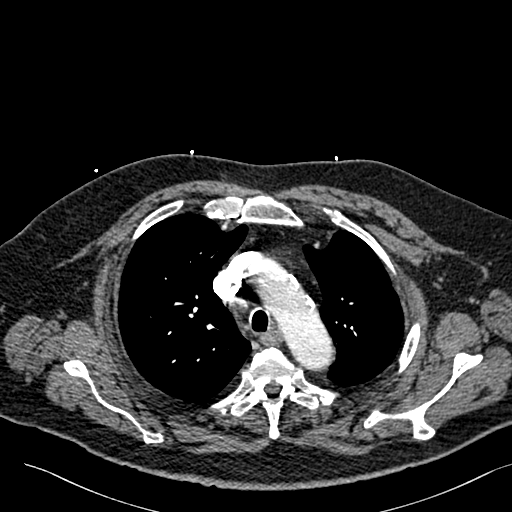
[im 255/303  lung]
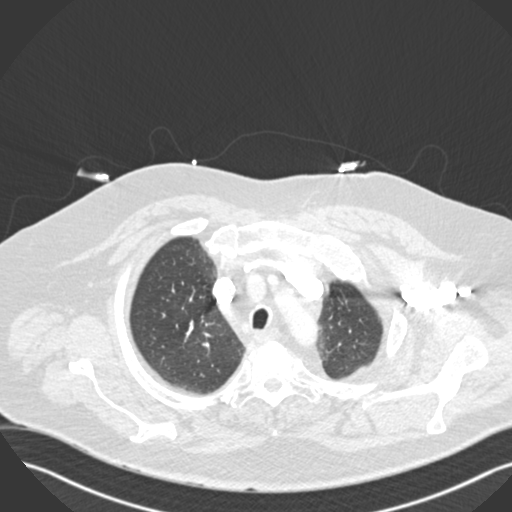
[im 271/303  mediastinal]
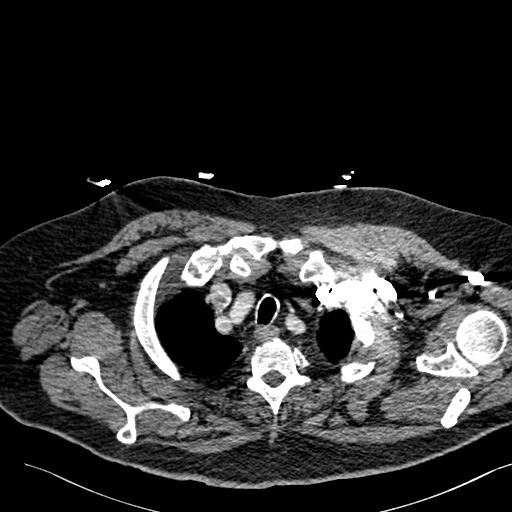
[im 287/303  lung]
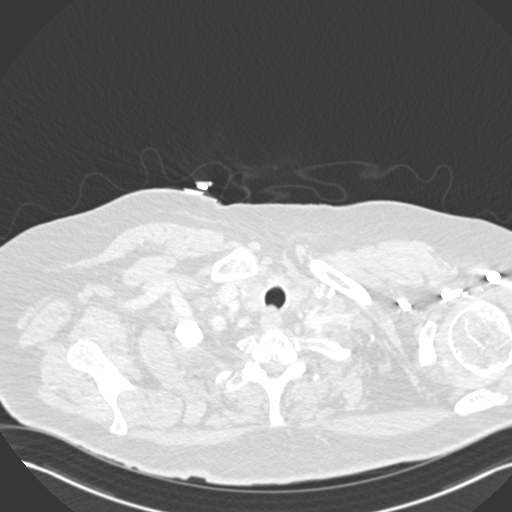

[Series 7: pe coronal mpr · coronal · 0.60mm/px · 1 of 142 slices shown]
[im 71/142  mediastinal]
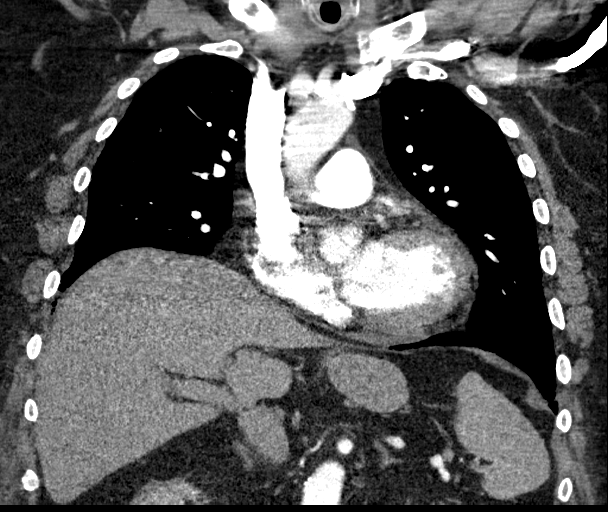

[18 of 36 positions shown; findings below may reference images not displayed]

FINDINGS: Cardiovascular: Satisfactory opacification of the pulmonary arteries
to the segmental level. No evidence of acute pulmonary embolism.
There is a tiny peripheral filling defect within in inter segmental
branch of the left lower lobar pulmonary artery (series 4, image
43), consistent with chronic pulmonary embolus. Aortic
atherosclerosis. Right-sided cardiac enlarged, decreased in
comparison the prior. No pericardial effusion.

Mediastinum/Nodes: No enlarged mediastinal, hilar, or axillary lymph
nodes. Thyroid gland, trachea, and esophagus demonstrate no
significant findings.

Lungs/Pleura: Bibasilar atelectasis. No focal consolidation. No
suspicious pulmonary nodules or masses. No pleural effusion or
pneumothorax.

Upper Abdomen: Please refer to same day CTA abdomen and pelvis for
findings below the diaphragm.

Musculoskeletal: Thoracic spondylosis. No suspicious lytic or
blastic lesions of bone.

Review of the MIP images confirms the above findings.
IMPRESSION: 1. No evidence of acute pulmonary embolism.
2. Tiny peripheral filling defect within an inter-segmental branch
of the left lower lobar pulmonary artery, consistent with chronic
pulmonary embolus.
3. Right-sided cardiac enlarged, which is decreased in comparison
the prior.
4. Aortic atherosclerosis.

Aortic Atherosclerosis ([id number].M).

## 2022-02-07 DIAGNOSIS — M25561 Pain in right knee: Secondary | ICD-10-CM | POA: Diagnosis not present

## 2022-02-07 NOTE — H&P (Signed)
TOTAL KNEE ADMISSION H&P  Patient is being admitted for right total knee arthroplasty.  Subjective:  Chief Complaint: Right knee pain.  HPI: [name] [name], 76 y.o. male has a history of pain and functional disability in the right knee due to arthritis and has failed non-surgical conservative treatments for greater than 12 weeks to include corticosteriod injections, viscosupplementation injections, and activity modification. Onset of symptoms was gradual, starting  several  years ago with gradually worsening course since that time. The patient noted no past surgery on the right knee.  Patient currently rates pain in the right knee at 8 out of 10 with activity. Patient has night pain, worsening of pain with activity and weight bearing, pain with passive range of motion, and crepitus. Patient has evidence of  severe bone-on-bone arthritis in the medial and patellofemoral compartments, worse on the right than the left  by imaging studies. There is no active infection.  Patient Active Problem List   Diagnosis Date Noted   Encounter for anticoagulation discussion and counseling 06/11/2021   Lower extremity edema 04/23/2021   Generalized weakness 04/21/2021   Recurrent pulmonary emboli ([place]) 02/23/2021   Class 2 obesity due to excess calories with body mass index (BMI) of 36.0 to 36.9 in adult 02/23/2021   Osteoarthritis of left knee 01/21/2021   History of DVT (deep vein thrombosis) 08/15/2020   History of pulmonary embolus (PE) 05/31/2020   Osteoarthritis of right knee 05/05/2019   Aortic atherosclerosis ([place]) 03/07/2017   B12 deficiency 12/31/2015   Hyperlipidemia 07/01/2014   Erectile dysfunction 02/28/2014   Vitamin D deficiency 12/14/2012   Testosterone deficiency    Hypertension, essential    Sinus bradycardia    GERD (gastroesophageal reflux disease)    Colon polyp history    BPH (benign prostatic hyperplasia)     Past Medical History:  Diagnosis Date   Colon polyp    GERD  (gastroesophageal reflux disease)    Hemorrhoid    Hyperplasia of prostate    Hypertension    Other and unspecified hyperlipidemia    Other testicular hypofunction    Pneumonia    Pulmonary embolism (HCC)    Sinus bradycardia     Past Surgical History:  Procedure Laterality Date   APPENDECTOMY     HAND SURGERY Right     Prior to Admission medications   Medication Sig Start Date End Date Taking? Authorizing Provider  apixaban (ELIQUIS) 5 MG TABS tablet Take 1 tablet (5 mg total) by mouth 2 (two) times daily. 04/09/21   Margaretha Seeds, MD  furosemide (LASIX) 20 MG tablet TAKE 1 TABLET BY MOUTH EVERY DAY 10/11/21   Dettinger, Fransisca Kaufmann, MD  omeprazole (PRILOSEC) 20 MG capsule TAKE 1 CAPSULE (20 MG TOTAL) BY MOUTH DAILY. 09/05/21   Dettinger, Fransisca Kaufmann, MD  rosuvastatin (CRESTOR) 10 MG tablet Take 1 tablet (10 mg total) by mouth daily. 08/30/21   Dettinger, Fransisca Kaufmann, MD  tadalafil (CIALIS) 5 MG tablet Take 1 tablet (5 mg total) by mouth daily. 12/19/21   Dettinger, Fransisca Kaufmann, MD  vitamin B-12 (CYANOCOBALAMIN) 100 MCG tablet Take 100 mcg by mouth daily.    [provider]  vitamin C (ASCORBIC ACID) 500 MG tablet Take 500 mg by mouth daily.    [provider]    Allergies  Allergen Reactions   Cialis [Tadalafil] Other (See Comments)    Not allergic 02/23/21   Viagra [Sildenafil Citrate] Other (See Comments)    heartburn    Social History  Socioeconomic History   Marital status: Widowed    Spouse name: Psychiatrist   Number of children: 3   Years of education: 10   Highest education level: 10th grade  Occupational History   Occupation: retired  Tobacco Use   Smoking status: Former    Types: Cigars    Quit date: 2015    Years since quitting: 8.5   Smokeless tobacco: Current    Types: Chew   Tobacco comments:    only 1 cigar on [employer] Use   Vaping Use: Never used  Substance and Sexual Activity   Alcohol use: Yes    Comment: daily, up to 1/2 pint    Drug use: No   Sexual activity: Not Currently  Other Topics Concern   Not on file  Social History Narrative   Grandson lives with him   Social Determinants of Health   Financial Resource Strain: Low Risk  (03/02/2021)   Overall Financial Resource Strain (CARDIA)    Difficulty of Paying Living Expenses: Not very hard  Food Insecurity: No Food Insecurity (03/02/2021)   Hunger Vital Sign    Worried About Running Out of Food in the Last Year: Never true    [place] in the Last Year: Never true  Transportation Needs: No Transportation Needs (03/02/2021)   PRAPARE - Hydrologist (Medical): No    Lack of Transportation (Non-Medical): No  Physical Activity: Insufficiently Active (03/02/2021)   Exercise Vital Sign    Days of Exercise per Week: 7 days    Minutes of Exercise per Session: 10 min  Stress: No Stress Concern Present (03/02/2021)   [place]    Feeling of Stress : Not at all  Social Connections: Socially Isolated (03/02/2021)   Social Connection and Isolation Panel [NHANES]    Frequency of Communication with Friends and Family: More than three times a week    Frequency of Social Gatherings with Friends and Family: More than three times a week    Attends Religious Services: Never    Marine scientist or Organizations: Not on file    Attends Archivist Meetings: Never    Marital Status: Widowed  Intimate Partner Violence: Not At Risk (03/02/2021)   Humiliation, Afraid, Rape, and Kick questionnaire    Fear of Current or Ex-Partner: No    Emotionally Abused: No    Physically Abused: No    Sexually Abused: No    Tobacco Use: High Risk (12/03/2021)   Patient History    Smoking Tobacco Use: Former    Smokeless Tobacco Use: Current    Passive Exposure: Not on file   Social History   Substance and Sexual Activity  Alcohol Use Yes   Comment: daily, up to 1/2 pint     Family History  Problem Relation Age of Onset   Heart attack Mother    Stroke Mother    Diabetes Father    Diabetes Daughter     Review of Systems  Constitutional:  Negative for chills and fever.  HENT:  Negative for congestion, sore throat and tinnitus.   Eyes:  Negative for double vision, photophobia and pain.  Respiratory:  Negative for cough, shortness of breath and wheezing.   Cardiovascular:  Negative for chest pain, palpitations and orthopnea.  Gastrointestinal:  Negative for heartburn, nausea and vomiting.  Genitourinary:  Negative for dysuria, frequency and urgency.  Musculoskeletal:  Positive for joint  pain.  Neurological:  Negative for dizziness, weakness and headaches.    Objective:  Physical Exam: Well nourished and well developed.  General: Alert and oriented x3, cooperative and pleasant, no acute distress.  Head: normocephalic, atraumatic, neck supple.  Eyes: EOMI.  Musculoskeletal:  Right Knee Exam:  No effusion present. No swelling present.  The range of motion is: 5 to 115 degrees.  Marked crepitus on range of motion of the knee.  Positive medial joint line tenderness.  No lateral joint line tenderness.  The knee is stable.  Calves soft and nontender. Motor function intact in LE. Strength 5/5 LE bilaterally. Neuro: Distal pulses 2+. Sensation to light touch intact in LE.  Imaging Review Plain radiographs demonstrate severe degenerative joint disease of the right knee. The overall alignment is neutral. The bone quality appears to be adequate for age and reported activity level.  Assessment/Plan:  End stage arthritis, right knee   The patient history, physical examination, clinical judgment of the provider and imaging studies are consistent with end stage degenerative joint disease of the right knee and total knee arthroplasty is deemed medically necessary. The treatment options including medical management, injection therapy arthroscopy and  arthroplasty were discussed at length. The risks and benefits of total knee arthroplasty were presented and reviewed. The risks due to aseptic loosening, infection, stiffness, patella tracking problems, thromboembolic complications and other imponderables were discussed. The patient acknowledged the explanation, agreed to proceed with the plan and consent was signed. Patient is being admitted for inpatient treatment for surgery, pain control, PT, OT, prophylactic antibiotics, VTE prophylaxis, progressive ambulation and ADLs and discharge planning. The patient is planning to be discharged  home .   Patient's anticipated LOS is less than 2 midnights, meeting these requirements: - Lives within 1 hour of care - Has a competent adult at home to recover with post-op recover - NO history of  - Chronic pain requiring opioids  - Diabetes  - Coronary Artery Disease  - Heart failure  - Heart attack  - Stroke  - Cardiac arrhythmia  - Respiratory Failure/COPD  - Renal failure  - Anemia  - Advanced Liver disease  Therapy Plans: Outpatient therapy at [facility] [place] [place]) Disposition: Home with wife Planned DVT Prophylaxis: Eliquis 5 mg BID DME Needed: Gilford Rile PCP: Caryl Pina, MD (clearance received) Pulmonologist: Rayann Heman, MD (clearance received) TXA: Topical Allergies: Viagra Anesthesia Concerns: None BMI: 39.6 Last HgbA1c: Not diabetic  Pharmacy: [facility] [place])  Other: - Stopping eliquis 2 days prior per pulmonology - History of PE  - Patient was instructed on what medications to stop prior to surgery. - Follow-up visit in 2 weeks with Dr. Wynelle Link - Begin physical therapy following surgery - Pre-operative lab work as pre-surgical testing - Prescriptions will be provided in hospital at time of discharge  Theresa Duty, PA-C Orthopedic Surgery [facility] [place]

## 2022-02-15 ENCOUNTER — Encounter: Payer: Self-pay | Admitting: Family Medicine

## 2022-02-15 ENCOUNTER — Ambulatory Visit (INDEPENDENT_AMBULATORY_CARE_PROVIDER_SITE_OTHER): Payer: Medicare HMO | Admitting: Family Medicine

## 2022-02-15 VITALS — BP 133/76 | HR 59 | Temp 98.2°F | Ht 67.0 in | Wt 250.0 lb

## 2022-02-15 DIAGNOSIS — Z86718 Personal history of other venous thrombosis and embolism: Secondary | ICD-10-CM

## 2022-02-15 DIAGNOSIS — Z86711 Personal history of pulmonary embolism: Secondary | ICD-10-CM

## 2022-02-15 DIAGNOSIS — N400 Enlarged prostate without lower urinary tract symptoms: Secondary | ICD-10-CM | POA: Diagnosis not present

## 2022-02-15 DIAGNOSIS — E782 Mixed hyperlipidemia: Secondary | ICD-10-CM

## 2022-02-15 DIAGNOSIS — K219 Gastro-esophageal reflux disease without esophagitis: Secondary | ICD-10-CM

## 2022-02-15 DIAGNOSIS — R609 Edema, unspecified: Secondary | ICD-10-CM

## 2022-02-15 DIAGNOSIS — I2699 Other pulmonary embolism without acute cor pulmonale: Secondary | ICD-10-CM | POA: Diagnosis not present

## 2022-02-15 DIAGNOSIS — I10 Essential (primary) hypertension: Secondary | ICD-10-CM

## 2022-02-15 LAB — LIPID PANEL
Chol/HDL Ratio: 4.5 ratio (ref 0.0–5.0)
Cholesterol, Total: 193 mg/dL (ref 100–199)
HDL: 43 mg/dL (ref >39)
LDL Chol Calc (NIH): 119 mg/dL — ABNORMAL HIGH (ref 0–99)
Triglycerides: 178 mg/dL — ABNORMAL HIGH (ref 0–149)
VLDL Cholesterol Cal: 31 mg/dL (ref 5–40)

## 2022-02-15 MED ORDER — TADALAFIL 5 MG PO TABS: 5.0000 mg | ORAL_TABLET | Freq: Every day | ORAL | 3 refills | Status: DC

## 2022-02-15 MED ORDER — OMEPRAZOLE 20 MG PO CPDR: 20.0000 mg | DELAYED_RELEASE_CAPSULE | Freq: Every day | ORAL | 1 refills | Status: DC

## 2022-02-15 MED ORDER — FUROSEMIDE 20 MG PO TABS: 20.0000 mg | ORAL_TABLET | Freq: Every day | ORAL | 3 refills | Status: DC

## 2022-02-15 NOTE — Progress Notes (Signed)
BP 133/76   Pulse (!) 59   Temp 98.2 F (36.8 C)   Ht '5\' 7"'$  (1.702 m)   Wt 250 lb (113.4 kg)   SpO2 94%   BMI 39.16 kg/m    Subjective:   Patient ID: [name] [name], male    DOB: 13-Jul-1946, 76 y.o.   MRN: [id number]  HPI: [name] [name] is a 76 y.o. male presenting on 02/15/2022 for Medical Management of Chronic Issues and Hypertension   HPI Hypertension Patient is currently on furosemide, and their blood pressure today is 133/76. Patient denies any lightheadedness or dizziness. Patient denies headaches, blurred vision, chest pains, shortness of breath, or weakness. Denies any side effects from medication and is content with current medication.   Hyperlipidemia Patient is coming in for recheck of his hyperlipidemia. The patient is currently taking no medication, discussed having Crestor and he says he does not want to take it. They deny any issues with myalgias or history of liver damage from it. They deny any focal numbness or weakness or chest pain.   GERD Patient is currently on omeprazole.  She denies any major symptoms or abdominal pain or belching or burping. She denies any blood in her stool or lightheadedness or dizziness.   Erectile dysfunction Patient uses generic Cialis for erectile dysfunction and it works for him and he wants to continue forward with it.  He has BPH and denies any other symptoms currently.  Patient is seeing an orthopedic and working on getting a right knee replacement   Relevant past medical, surgical, family and social history reviewed and updated as indicated. Interim medical history since our last visit reviewed. Allergies and medications reviewed and updated.  Review of Systems  Constitutional:  Negative for chills and fever.  Eyes:  Negative for visual disturbance.  Respiratory:  Negative for shortness of breath and wheezing.   Cardiovascular:  Negative for chest pain and leg swelling.  Musculoskeletal:  Negative for back pain and  gait problem.  Skin:  Negative for rash.  Neurological:  Negative for dizziness, weakness and light-headedness.  All other systems reviewed and are negative.   Per HPI unless specifically indicated above   Allergies as of 02/15/2022       Reactions   Cialis [tadalafil] Other (See Comments)   Not allergic 02/23/21   Viagra [sildenafil Citrate] Other (See Comments)   heartburn        Medication List        Accurate as of February 15, 2022  8:36 AM. If you have any questions, ask your nurse or doctor.          STOP taking these medications    rosuvastatin 10 MG tablet Commonly known as: Crestor Stopped by: Fransisca Kaufmann Twinkle Sockwell, MD       TAKE these medications    apixaban 5 MG Tabs tablet Commonly known as: ELIQUIS Take 1 tablet (5 mg total) by mouth 2 (two) times daily.   furosemide 20 MG tablet Commonly known as: LASIX Take 1 tablet (20 mg total) by mouth daily.   omeprazole 20 MG capsule Commonly known as: PRILOSEC Take 1 capsule (20 mg total) by mouth daily.   SUPER B COMPLEX PO Take 1 tablet by mouth daily.   tadalafil 5 MG tablet Commonly known as: CIALIS Take 1 tablet (5 mg total) by mouth daily.   vitamin C 500 MG tablet Commonly known as: ASCORBIC ACID Take 500 mg by mouth daily.  Objective:   BP 133/76   Pulse (!) 59   Temp 98.2 F (36.8 C)   Ht '5\' 7"'$  (1.702 m)   Wt 250 lb (113.4 kg)   SpO2 94%   BMI 39.16 kg/m   Wt Readings from Last 3 Encounters:  02/15/22 250 lb (113.4 kg)  12/03/21 246 lb (111.6 kg)  11/29/21 247 lb 3.2 oz (112.1 kg)    Physical Exam Vitals and nursing note reviewed.  Constitutional:      General: He is not in acute distress.    Appearance: He is well-developed. He is not diaphoretic.  Eyes:     General: No scleral icterus.    Conjunctiva/sclera: Conjunctivae normal.  Neck:     Thyroid: No thyromegaly.  Cardiovascular:     Rate and Rhythm: Normal rate and regular rhythm.     Heart sounds: Normal  heart sounds. No murmur heard. Pulmonary:     Effort: Pulmonary effort is normal. No respiratory distress.     Breath sounds: Normal breath sounds. No wheezing.  Musculoskeletal:        General: No swelling. Normal range of motion.     Cervical back: Neck supple.  Lymphadenopathy:     Cervical: No cervical adenopathy.  Skin:    General: Skin is warm and dry.     Findings: No rash.  Neurological:     Mental Status: He is alert and oriented to person, place, and time.     Coordination: Coordination normal.  Psychiatric:        Behavior: Behavior normal.       Assessment & Plan:   Problem List Items Addressed This Visit       Cardiovascular and Mediastinum   Hypertension, essential   Relevant Medications   tadalafil (CIALIS) 5 MG tablet   furosemide (LASIX) 20 MG tablet   Recurrent pulmonary emboli (HCC)   Relevant Medications   tadalafil (CIALIS) 5 MG tablet   furosemide (LASIX) 20 MG tablet     Digestive   GERD (gastroesophageal reflux disease)   Relevant Medications   omeprazole (PRILOSEC) 20 MG capsule     Genitourinary   BPH (benign prostatic hyperplasia)   Relevant Medications   tadalafil (CIALIS) 5 MG tablet     Other   Hyperlipidemia - Primary   Relevant Medications   tadalafil (CIALIS) 5 MG tablet   furosemide (LASIX) 20 MG tablet   Other Relevant Orders   Lipid panel   History of pulmonary embolus (PE)   History of DVT (deep vein thrombosis)   Other Visit Diagnoses     Peripheral edema       Relevant Medications   furosemide (LASIX) 20 MG tablet       Continue current medicine, discussed a cholesterol medicine and that his cardiac risk score was high but patient declines at this point, will think about it for the future. Follow up plan: Return in about 6 months (around 08/18/2022), or if symptoms worsen or fail to improve, for Hyperlipidemia and hypertension.  Counseling provided for all of the vaccine components Orders Placed This Encounter   Procedures   Lipid panel    Caryl Pina, MD [facility] 02/15/2022, 8:36 AM

## 2022-02-18 NOTE — Progress Notes (Signed)
DUE TO COVID-19 ONLY  2  VISITOR IS ALLOWED TO COME WITH YOU AND STAY IN THE WAITING ROOM ONLY DURING PRE OP AND PROCEDURE DAY OF SURGERY.   4 VISITOR  MAY VISIT WITH YOU AFTER SURGERY IN YOUR PRIVATE ROOM DURING VISITING HOURS ONLY! YOU MAY HAVE ONE PERSON SPEND THE NITE WITH YOU IN YOUR ROOM AFTER SURGERY.   .    Your procedure is scheduled on:                  03/04/2022   Report to [facility] Main  Entrance   Report to admitting at     0700            AM DO NOT BRING INSURANCE CARD, PICTURE ID OR WALLET DAY OF SURGERY.      Call this number if you have problems the morning of surgery [phone]    REMEMBER: NO  SOLID FOODS , CANDY, GUM OR MINTS AFTER [place] .       Marland Kitchen CLEAR LIQUIDS UNTIL    0615am             DAY OF SURGERY.      PLEASE FINISH ENSURE DRINK PER SURGEON ORDER  WHICH NEEDS TO BE COMPLETED AT    0615am        MORNING OF SURGERY.       CLEAR LIQUID DIET   Foods Allowed      WATER BLACK COFFEE ( SUGAR OK, NO MILK, CREAM OR CREAMER) REGULAR AND DECAF  TEA ( SUGAR OK NO MILK, CREAM, OR CREAMER) REGULAR AND DECAF  PLAIN JELLO ( NO RED)  FRUIT ICES ( NO RED, NO FRUIT PULP)  POPSICLES ( NO RED)  JUICE- APPLE, WHITE GRAPE AND WHITE CRANBERRY  SPORT DRINK LIKE GATORADE ( NO RED)  CLEAR BROTH ( VEGETABLE , CHICKEN OR BEEF)                                                                     BRUSH YOUR TEETH MORNING OF SURGERY AND RINSE YOUR MOUTH OUT, NO CHEWING GUM CANDY OR MINTS.     Take these medicines the morning of surgery with A SIP OF WATER:  omeprazole    DO NOT TAKE ANY DIABETIC MEDICATIONS DAY OF YOUR SURGERY                               You may not have any metal on your body including hair pins and              piercings  Do not wear jewelry, make-up, lotions, powders or perfumes, deodorant             Do not wear nail polish on your fingernails.              IF YOU ARE A MALE AND WANT TO SHAVE UNDER ARMS OR LEGS  PRIOR TO SURGERY YOU MUST DO SO AT LEAST 48 HOURS PRIOR TO SURGERY.              Men may shave face and neck.   Do not bring valuables to the hospital. [facility] IS NOT             RESPONSIBLE   FOR VALUABLES.  Contacts, dentures or bridgework may not be worn into surgery.  Leave suitcase in the car. After surgery it may be brought to your room.     Patients discharged the day of surgery will not be allowed to drive home. IF YOU ARE HAVING SURGERY AND GOING HOME THE SAME DAY, YOU MUST HAVE AN ADULT TO DRIVE YOU HOME AND BE WITH YOU FOR 24 HOURS. YOU MAY GO HOME BY TAXI OR UBER OR ORTHERWISE, BUT AN ADULT MUST ACCOMPANY YOU HOME AND STAY WITH YOU FOR 24 HOURS.                Please read over the following fact sheets you were given: _____________________________________________________________________  [facility] - Preparing for Surgery Before surgery, you can play an important role.  Because skin is not sterile, your skin needs to be as free of germs as possible.  You can reduce the number of germs on your skin by washing with CHG (chlorahexidine gluconate) soap before surgery.  CHG is an antiseptic cleaner which kills germs and bonds with the skin to continue killing germs even after washing. Please DO NOT use if you have an allergy to CHG or antibacterial soaps.  If your skin becomes reddened/irritated stop using the CHG and inform your nurse when you arrive at Short Stay. Do not shave (including legs and underarms) for at least 48 hours prior to the first CHG shower.  You may shave your face/neck. Please follow these instructions carefully:  1.  Shower with CHG Soap the night before surgery and the  morning of Surgery.  2.  If you choose to wash your hair, wash your hair first as usual with your  normal  shampoo.  3.  After you shampoo, rinse your hair and body thoroughly to remove the  shampoo.                           4.  Use CHG as you would any other liquid soap.  You can apply chg  directly  to the skin and wash                       Gently with a scrungie or clean washcloth.  5.  Apply the CHG Soap to your body ONLY FROM THE NECK DOWN.   Do not use on face/ open                           Wound or open sores. Avoid contact with eyes, ears mouth and genitals (private parts).                       Wash face,  Genitals (private parts) with your normal soap.             6.  Wash thoroughly, paying special attention to the area where your surgery  will be performed.  7.  Thoroughly rinse your body with warm water from the neck down.  8.  DO NOT shower/wash with your normal soap after using and rinsing off  the CHG Soap.                9.  Pat yourself dry with a clean towel.  10.  Wear clean pajamas.            11.  Place clean sheets on your bed the night of your first shower and do not  sleep with pets. Day of Surgery : Do not apply any lotions/deodorants the morning of surgery.  Please wear clean clothes to the hospital/surgery center.  FAILURE TO FOLLOW THESE INSTRUCTIONS MAY RESULT IN THE CANCELLATION OF YOUR SURGERY PATIENT SIGNATURE_________________________________  NURSE SIGNATURE__________________________________  ________________________________________________________________________

## 2022-02-18 NOTE — Progress Notes (Signed)
Anesthesia Review:  PCP: Cardiologist : Chest x-ray : 06/22/21- 1view   EKG : 12/03/21  Echo : 05/07/21  Stress test: Cardiac Cath :  Activity level:  Sleep Study/ CPAP : Fasting Blood Sugar :      / Checks Blood Sugar -- times a day:   Blood Thinner/ Instructions /Last Dose: ASA / Instructions/ Last Dose :   Eliquis

## 2022-02-20 ENCOUNTER — Encounter (HOSPITAL_COMMUNITY): Payer: Self-pay

## 2022-02-20 ENCOUNTER — Other Ambulatory Visit: Payer: Self-pay

## 2022-02-20 ENCOUNTER — Encounter (HOSPITAL_COMMUNITY)
Admission: RE | Admit: 2022-02-20 | Discharge: 2022-02-20 | Disposition: A | Discharge status: Home / Self Care | Payer: Medicare HMO | Source: Ambulatory Visit | Attending: Orthopedic Surgery | Admitting: Orthopedic Surgery

## 2022-02-20 VITALS — BP 160/78 | HR 54 | Temp 98.1°F | Resp 16 | Ht 67.0 in

## 2022-02-20 DIAGNOSIS — Z01812 Encounter for preprocedural laboratory examination: Secondary | ICD-10-CM | POA: Diagnosis not present

## 2022-02-20 DIAGNOSIS — Z01818 Encounter for other preprocedural examination: Secondary | ICD-10-CM | POA: Diagnosis present

## 2022-02-20 HISTORY — DX: Acute embolism and thrombosis of unspecified deep veins of unspecified lower extremity: I82.409

## 2022-02-20 HISTORY — DX: Personal history of urinary calculi: Z87.442

## 2022-02-20 HISTORY — DX: Unspecified osteoarthritis, unspecified site: M19.90

## 2022-02-20 LAB — BASIC METABOLIC PANEL
Anion gap: 9 (ref 5–15)
BUN: 11 mg/dL (ref 8–23)
CO2: 33 mmol/L — ABNORMAL HIGH (ref 22–32)
Calcium: 9.4 mg/dL (ref 8.9–10.3)
Chloride: 101 mmol/L (ref 98–111)
Creatinine, Ser: 0.98 mg/dL (ref 0.61–1.24)
GFR, Estimated: >60 mL/min (ref >60)
Glucose, Bld: 131 mg/dL — ABNORMAL HIGH (ref 70–99)
Potassium: 4.2 mmol/L (ref 3.5–5.1)
Sodium: 143 mmol/L (ref 135–145)

## 2022-02-20 LAB — SURGICAL PCR SCREEN
MRSA, PCR: NEGATIVE
Staphylococcus aureus: NEGATIVE

## 2022-02-20 LAB — CBC
HCT: 41.4 % (ref 39.0–52.0)
Hemoglobin: 14.5 g/dL (ref 13.0–17.0)
MCH: 34.4 pg — ABNORMAL HIGH (ref 26.0–34.0)
MCHC: 35 g/dL (ref 30.0–36.0)
MCV: 98.3 fL (ref 80.0–100.0)
Platelets: 159 10*3/uL (ref 150–400)
RBC: 4.21 MIL/uL — ABNORMAL LOW (ref 4.22–5.81)
RDW: 12.4 % (ref 11.5–15.5)
WBC: 7.5 10*3/uL (ref 4.0–10.5)
nRBC: 0 % (ref 0.0–0.2)

## 2022-03-04 ENCOUNTER — Ambulatory Visit (HOSPITAL_BASED_OUTPATIENT_CLINIC_OR_DEPARTMENT_OTHER): Payer: Medicare HMO | Admitting: Certified Registered"

## 2022-03-04 ENCOUNTER — Encounter (HOSPITAL_COMMUNITY): Payer: Self-pay | Admitting: Orthopedic Surgery

## 2022-03-04 ENCOUNTER — Other Ambulatory Visit: Payer: Self-pay

## 2022-03-04 ENCOUNTER — Ambulatory Visit (HOSPITAL_COMMUNITY): Payer: Medicare HMO | Admitting: Physician Assistant

## 2022-03-04 ENCOUNTER — Observation Stay (HOSPITAL_COMMUNITY)
Admission: RE | Admit: 2022-03-04 | Discharge: 2022-03-05 | Disposition: A | Discharge status: Home / Self Care | Payer: Medicare HMO | Attending: Orthopedic Surgery | Admitting: Orthopedic Surgery

## 2022-03-04 ENCOUNTER — Encounter (HOSPITAL_COMMUNITY)
Admission: RE | Disposition: A | Discharge status: Home / Self Care | Payer: Self-pay | Source: Home / Self Care | Attending: Orthopedic Surgery

## 2022-03-04 DIAGNOSIS — I10 Essential (primary) hypertension: Secondary | ICD-10-CM | POA: Diagnosis not present

## 2022-03-04 DIAGNOSIS — M179 Osteoarthritis of knee, unspecified: Secondary | ICD-10-CM | POA: Diagnosis present

## 2022-03-04 DIAGNOSIS — M1711 Unilateral primary osteoarthritis, right knee: Principal | ICD-10-CM | POA: Insufficient documentation

## 2022-03-04 DIAGNOSIS — Z79899 Other long term (current) drug therapy: Secondary | ICD-10-CM | POA: Insufficient documentation

## 2022-03-04 DIAGNOSIS — Z01818 Encounter for other preprocedural examination: Secondary | ICD-10-CM

## 2022-03-04 DIAGNOSIS — Z87891 Personal history of nicotine dependence: Secondary | ICD-10-CM | POA: Insufficient documentation

## 2022-03-04 DIAGNOSIS — Z86718 Personal history of other venous thrombosis and embolism: Secondary | ICD-10-CM | POA: Insufficient documentation

## 2022-03-04 DIAGNOSIS — I82409 Acute embolism and thrombosis of unspecified deep veins of unspecified lower extremity: Secondary | ICD-10-CM | POA: Diagnosis not present

## 2022-03-04 DIAGNOSIS — Z86711 Personal history of pulmonary embolism: Secondary | ICD-10-CM | POA: Insufficient documentation

## 2022-03-04 DIAGNOSIS — M1990 Unspecified osteoarthritis, unspecified site: Secondary | ICD-10-CM

## 2022-03-04 DIAGNOSIS — M25561 Pain in right knee: Secondary | ICD-10-CM | POA: Diagnosis not present

## 2022-03-04 DIAGNOSIS — Z7901 Long term (current) use of anticoagulants: Secondary | ICD-10-CM | POA: Diagnosis not present

## 2022-03-04 DIAGNOSIS — G8918 Other acute postprocedural pain: Secondary | ICD-10-CM | POA: Diagnosis not present

## 2022-03-04 HISTORY — PX: TOTAL KNEE ARTHROPLASTY: SHX125

## 2022-03-04 SURGERY — ARTHROPLASTY, KNEE, TOTAL
Anesthesia: General | Site: Knee | Laterality: Right

## 2022-03-04 MED ORDER — METHOCARBAMOL 500 MG IVPB - SIMPLE MED: 500.0000 mg | Freq: Four times a day (QID) | INTRAVENOUS | Status: DC | PRN

## 2022-03-04 MED ORDER — STERILE WATER FOR IRRIGATION IR SOLN
Status: DC | PRN
  Administered 2022-03-04: 2000 mL

## 2022-03-04 MED ORDER — TRANEXAMIC ACID 1000 MG/10ML IV SOLN
INTRAVENOUS | Status: DC | PRN
  Administered 2022-03-04: 2000 mg via TOPICAL

## 2022-03-04 MED ORDER — PHENOL 1.4 % MT LIQD
1.0000 | OROMUCOSAL | Status: DC | PRN
Start: 2022-03-04 — End: 2022-03-05

## 2022-03-04 MED ORDER — BUPIVACAINE LIPOSOME 1.3 % IJ SUSP
INTRAMUSCULAR | Status: AC
  Filled 2022-03-04: qty 20

## 2022-03-04 MED ORDER — OXYCODONE HCL 5 MG PO TABS
5.0000 mg | ORAL_TABLET | ORAL | Status: DC | PRN
  Administered 2022-03-04: 10 mg via ORAL
  Filled 2022-03-04: qty 2

## 2022-03-04 MED ORDER — ACETAMINOPHEN 500 MG PO TABS
1000.0000 mg | ORAL_TABLET | Freq: Four times a day (QID) | ORAL | Status: AC
  Administered 2022-03-04 – 2022-03-05 (×3): 1000 mg via ORAL
  Filled 2022-03-04 (×4): qty 2

## 2022-03-04 MED ORDER — KETOROLAC TROMETHAMINE 30 MG/ML IJ SOLN
INTRAMUSCULAR | Status: AC
  Filled 2022-03-04: qty 1

## 2022-03-04 MED ORDER — HYDROMORPHONE HCL 2 MG/ML IJ SOLN
INTRAMUSCULAR | Status: AC
  Filled 2022-03-04: qty 1

## 2022-03-04 MED ORDER — DIPHENHYDRAMINE HCL 12.5 MG/5ML PO ELIX: 12.5000 mg | ORAL_SOLUTION | ORAL | Status: DC | PRN

## 2022-03-04 MED ORDER — DOCUSATE SODIUM 100 MG PO CAPS
100.0000 mg | ORAL_CAPSULE | Freq: Two times a day (BID) | ORAL | Status: DC
  Administered 2022-03-04 – 2022-03-05 (×2): 100 mg via ORAL
  Filled 2022-03-04 (×2): qty 1

## 2022-03-04 MED ORDER — MORPHINE SULFATE (PF) 2 MG/ML IV SOLN: 1.0000 mg | INTRAVENOUS | Status: DC | PRN

## 2022-03-04 MED ORDER — POVIDONE-IODINE 10 % EX SWAB
2.0000 | Freq: Once | CUTANEOUS | Status: AC
  Administered 2022-03-04: 2 via TOPICAL

## 2022-03-04 MED ORDER — DEXAMETHASONE SODIUM PHOSPHATE 10 MG/ML IJ SOLN
INTRAMUSCULAR | Status: AC
  Filled 2022-03-04: qty 1

## 2022-03-04 MED ORDER — PHENYLEPHRINE HCL (PRESSORS) 10 MG/ML IV SOLN
INTRAVENOUS | Status: DC | PRN
  Administered 2022-03-04: 160 ug via INTRAVENOUS

## 2022-03-04 MED ORDER — ORAL CARE MOUTH RINSE: 15.0000 mL | Freq: Once | OROMUCOSAL | Status: AC

## 2022-03-04 MED ORDER — FENTANYL CITRATE PF 50 MCG/ML IJ SOSY
50.0000 ug | PREFILLED_SYRINGE | INTRAMUSCULAR | Status: AC
  Administered 2022-03-04: 50 ug via INTRAVENOUS
  Filled 2022-03-04: qty 2

## 2022-03-04 MED ORDER — CLONIDINE HCL (ANALGESIA) 100 MCG/ML EP SOLN
EPIDURAL | Status: DC | PRN
  Administered 2022-03-04: 50 ug

## 2022-03-04 MED ORDER — PROPOFOL 10 MG/ML IV BOLUS
INTRAVENOUS | Status: DC | PRN
  Administered 2022-03-04: 200 mg via INTRAVENOUS

## 2022-03-04 MED ORDER — SODIUM CHLORIDE 0.9 % IR SOLN
Status: DC | PRN
  Administered 2022-03-04: 1000 mL

## 2022-03-04 MED ORDER — AMISULPRIDE (ANTIEMETIC) 5 MG/2ML IV SOLN: 10.0000 mg | Freq: Once | INTRAVENOUS | Status: DC | PRN

## 2022-03-04 MED ORDER — METHOCARBAMOL 500 MG PO TABS
500.0000 mg | ORAL_TABLET | Freq: Four times a day (QID) | ORAL | Status: DC | PRN
  Administered 2022-03-04 – 2022-03-05 (×3): 500 mg via ORAL
  Filled 2022-03-04 (×3): qty 1

## 2022-03-04 MED ORDER — KETAMINE HCL 10 MG/ML IJ SOLN
INTRAMUSCULAR | Status: AC
  Filled 2022-03-04: qty 1

## 2022-03-04 MED ORDER — METOCLOPRAMIDE HCL 5 MG/ML IJ SOLN: 5.0000 mg | Freq: Three times a day (TID) | INTRAMUSCULAR | Status: DC | PRN

## 2022-03-04 MED ORDER — HYDROMORPHONE HCL 1 MG/ML IJ SOLN: 0.2500 mg | INTRAMUSCULAR | Status: DC | PRN

## 2022-03-04 MED ORDER — BISACODYL 10 MG RE SUPP: 10.0000 mg | Freq: Every day | RECTAL | Status: DC | PRN

## 2022-03-04 MED ORDER — ACETAMINOPHEN 10 MG/ML IV SOLN
1000.0000 mg | Freq: Four times a day (QID) | INTRAVENOUS | Status: DC
  Administered 2022-03-04: 1000 mg via INTRAVENOUS
  Filled 2022-03-04: qty 100

## 2022-03-04 MED ORDER — POLYETHYLENE GLYCOL 3350 17 G PO PACK: 17.0000 g | PACK | Freq: Every day | ORAL | Status: DC | PRN

## 2022-03-04 MED ORDER — PANTOPRAZOLE SODIUM 40 MG PO TBEC
40.0000 mg | DELAYED_RELEASE_TABLET | Freq: Every day | ORAL | Status: DC
  Administered 2022-03-05: 40 mg via ORAL
  Filled 2022-03-04: qty 1

## 2022-03-04 MED ORDER — ROPIVACAINE HCL 5 MG/ML IJ SOLN
INTRAMUSCULAR | Status: DC | PRN
  Administered 2022-03-04: 20 mL via PERINEURAL

## 2022-03-04 MED ORDER — ONDANSETRON HCL 4 MG/2ML IJ SOLN
INTRAMUSCULAR | Status: AC
  Filled 2022-03-04: qty 2

## 2022-03-04 MED ORDER — HYDROMORPHONE HCL 1 MG/ML IJ SOLN
INTRAMUSCULAR | Status: DC | PRN
  Administered 2022-03-04: 1 mg via INTRAVENOUS

## 2022-03-04 MED ORDER — DEXAMETHASONE SODIUM PHOSPHATE 10 MG/ML IJ SOLN
10.0000 mg | Freq: Once | INTRAMUSCULAR | Status: AC
  Administered 2022-03-05: 10 mg via INTRAVENOUS
  Filled 2022-03-04: qty 1

## 2022-03-04 MED ORDER — LACTATED RINGERS IV SOLN: INTRAVENOUS | Status: DC

## 2022-03-04 MED ORDER — ONDANSETRON HCL 4 MG/2ML IJ SOLN
4.0000 mg | Freq: Four times a day (QID) | INTRAMUSCULAR | Status: DC | PRN
  Administered 2022-03-04: 4 mg via INTRAVENOUS
  Filled 2022-03-04: qty 2

## 2022-03-04 MED ORDER — SODIUM CHLORIDE (PF) 0.9 % IJ SOLN
INTRAMUSCULAR | Status: AC
  Filled 2022-03-04: qty 10

## 2022-03-04 MED ORDER — TRAMADOL HCL 50 MG PO TABS: 50.0000 mg | ORAL_TABLET | Freq: Four times a day (QID) | ORAL | Status: DC | PRN

## 2022-03-04 MED ORDER — 0.9 % SODIUM CHLORIDE (POUR BTL) OPTIME
TOPICAL | Status: DC | PRN
  Administered 2022-03-04: 1000 mL

## 2022-03-04 MED ORDER — CHLORHEXIDINE GLUCONATE 0.12 % MT SOLN
15.0000 mL | Freq: Once | OROMUCOSAL | Status: AC
  Administered 2022-03-04: 15 mL via OROMUCOSAL

## 2022-03-04 MED ORDER — FUROSEMIDE 20 MG PO TABS
20.0000 mg | ORAL_TABLET | Freq: Every day | ORAL | Status: DC
  Filled 2022-03-04: qty 1

## 2022-03-04 MED ORDER — ONDANSETRON HCL 4 MG PO TABS: 4.0000 mg | ORAL_TABLET | Freq: Four times a day (QID) | ORAL | Status: DC | PRN

## 2022-03-04 MED ORDER — METOCLOPRAMIDE HCL 5 MG PO TABS: 5.0000 mg | ORAL_TABLET | Freq: Three times a day (TID) | ORAL | Status: DC | PRN

## 2022-03-04 MED ORDER — SODIUM CHLORIDE (PF) 0.9 % IJ SOLN
INTRAMUSCULAR | Status: AC
  Filled 2022-03-04: qty 50

## 2022-03-04 MED ORDER — CEFAZOLIN SODIUM-DEXTROSE 2-4 GM/100ML-% IV SOLN
2.0000 g | INTRAVENOUS | Status: AC
  Administered 2022-03-04: 2 g via INTRAVENOUS
  Filled 2022-03-04: qty 100

## 2022-03-04 MED ORDER — CEFAZOLIN SODIUM-DEXTROSE 2-4 GM/100ML-% IV SOLN
2.0000 g | Freq: Four times a day (QID) | INTRAVENOUS | Status: AC
  Administered 2022-03-04 (×2): 2 g via INTRAVENOUS
  Filled 2022-03-04 (×2): qty 100

## 2022-03-04 MED ORDER — MENTHOL 3 MG MT LOZG: 1.0000 | LOZENGE | OROMUCOSAL | Status: DC | PRN

## 2022-03-04 MED ORDER — PROPOFOL 1000 MG/100ML IV EMUL
INTRAVENOUS | Status: AC
  Filled 2022-03-04: qty 100

## 2022-03-04 MED ORDER — DEXAMETHASONE SODIUM PHOSPHATE 4 MG/ML IJ SOLN
INTRAMUSCULAR | Status: DC | PRN
  Administered 2022-03-04: 8 mg via INTRAVENOUS

## 2022-03-04 MED ORDER — FLEET ENEMA 7-19 GM/118ML RE ENEM: 1.0000 | ENEMA | Freq: Once | RECTAL | Status: DC | PRN

## 2022-03-04 MED ORDER — TRANEXAMIC ACID 1000 MG/10ML IV SOLN
2000.0000 mg | Freq: Once | INTRAVENOUS | Status: DC
  Filled 2022-03-04: qty 20

## 2022-03-04 MED ORDER — BUPIVACAINE LIPOSOME 1.3 % IJ SUSP: 20.0000 mL | Freq: Once | INTRAMUSCULAR | Status: DC

## 2022-03-04 MED ORDER — MIDAZOLAM HCL 2 MG/2ML IJ SOLN
1.0000 mg | INTRAMUSCULAR | Status: DC
  Filled 2022-03-04: qty 2

## 2022-03-04 MED ORDER — LIDOCAINE HCL (CARDIAC) PF 100 MG/5ML IV SOSY
PREFILLED_SYRINGE | INTRAVENOUS | Status: DC | PRN
  Administered 2022-03-04: 20 mg via INTRAVENOUS

## 2022-03-04 MED ORDER — KETAMINE HCL 10 MG/ML IJ SOLN
INTRAMUSCULAR | Status: DC | PRN
  Administered 2022-03-04: 30 mg via INTRAVENOUS

## 2022-03-04 MED ORDER — APIXABAN 2.5 MG PO TABS
2.5000 mg | ORAL_TABLET | Freq: Two times a day (BID) | ORAL | Status: DC
  Filled 2022-03-04: qty 1

## 2022-03-04 MED ORDER — SODIUM CHLORIDE (PF) 0.9 % IJ SOLN
INTRAMUSCULAR | Status: DC | PRN
  Administered 2022-03-04: 60 mL

## 2022-03-04 MED ORDER — DEXAMETHASONE SODIUM PHOSPHATE 10 MG/ML IJ SOLN: 8.0000 mg | Freq: Once | INTRAMUSCULAR | Status: DC

## 2022-03-04 MED ORDER — SODIUM CHLORIDE 0.9 % IV SOLN: INTRAVENOUS | Status: DC

## 2022-03-04 MED ORDER — ONDANSETRON HCL 4 MG/2ML IJ SOLN
INTRAMUSCULAR | Status: DC | PRN
  Administered 2022-03-04: 4 mg via INTRAVENOUS

## 2022-03-04 MED ORDER — BUPIVACAINE LIPOSOME 1.3 % IJ SUSP
INTRAMUSCULAR | Status: DC | PRN
  Administered 2022-03-04: 20 mL

## 2022-03-04 SURGICAL SUPPLY — 58 items
ATTUNE MED DOME PAT 41 KNEE (Knees) ×1 IMPLANT
ATTUNE PS FEM RT SZ 7 CEM KNEE (Femur) ×1 IMPLANT
ATTUNE PSRP INSR SZ7 8 KNEE (Insert) ×1 IMPLANT
BAG COUNTER SPONGE SURGICOUNT (BAG) ×1 IMPLANT
BAG SPEC THK2 15X12 ZIP CLS (MISCELLANEOUS) ×1
BAG SPNG CNTER NS LX DISP (BAG) ×1
BAG ZIPLOCK 12X15 (MISCELLANEOUS) ×3 IMPLANT
BASE TIBIAL ROT PLAT SZ 7 KNEE (Knees) IMPLANT
BLADE SAG 18X100X1.27 (BLADE) ×3 IMPLANT
BLADE SAW SGTL 11.0X1.19X90.0M (BLADE) ×3 IMPLANT
BNDG ELASTIC 6X5.8 VLCR STR LF (GAUZE/BANDAGES/DRESSINGS) ×3 IMPLANT
BOWL SMART MIX CTS (DISPOSABLE) ×3 IMPLANT
BSPLAT TIB 7 CMNT ROT PLAT STR (Knees) ×1 IMPLANT
CEMENT HV SMART SET (Cement) ×6 IMPLANT
COVER SURGICAL LIGHT HANDLE (MISCELLANEOUS) ×3 IMPLANT
CUFF TOURN SGL QUICK 34 (TOURNIQUET CUFF) ×2
CUFF TRNQT CYL 34X4.125X (TOURNIQUET CUFF) ×2 IMPLANT
DRAPE INCISE IOBAN 66X45 STRL (DRAPES) ×3 IMPLANT
DRAPE U-SHAPE 47X51 STRL (DRAPES) ×3 IMPLANT
DRSG AQUACEL AG ADV 3.5X10 (GAUZE/BANDAGES/DRESSINGS) ×3 IMPLANT
DURAPREP 26ML APPLICATOR (WOUND CARE) ×3 IMPLANT
ELECT REM PT RETURN 15FT ADLT (MISCELLANEOUS) ×3 IMPLANT
GLOVE BIO SURGEON STRL SZ 6.5 (GLOVE) IMPLANT
GLOVE BIO SURGEON STRL SZ7.5 (GLOVE) ×1 IMPLANT
GLOVE BIO SURGEON STRL SZ8 (GLOVE) ×3 IMPLANT
GLOVE BIOGEL PI IND STRL 6.5 (GLOVE) IMPLANT
GLOVE BIOGEL PI IND STRL 7.0 (GLOVE) IMPLANT
GLOVE BIOGEL PI IND STRL 8 (GLOVE) ×2 IMPLANT
GLOVE BIOGEL PI INDICATOR 6.5 (GLOVE)
GLOVE BIOGEL PI INDICATOR 7.0 (GLOVE)
GLOVE BIOGEL PI INDICATOR 8 (GLOVE) ×1
GOWN STRL REUS W/ TWL LRG LVL3 (GOWN DISPOSABLE) ×2 IMPLANT
GOWN STRL REUS W/ TWL XL LVL3 (GOWN DISPOSABLE) IMPLANT
GOWN STRL REUS W/TWL LRG LVL3 (GOWN DISPOSABLE) ×2
GOWN STRL REUS W/TWL XL LVL3 (GOWN DISPOSABLE) ×2
HANDPIECE INTERPULSE COAX TIP (DISPOSABLE) ×2
HOLDER FOLEY CATH W/STRAP (MISCELLANEOUS) IMPLANT
IMMOBILIZER KNEE 20 (SOFTGOODS) ×2
IMMOBILIZER KNEE 20 THIGH 36 (SOFTGOODS) ×2 IMPLANT
KIT TURNOVER KIT A (KITS) ×1 IMPLANT
MANIFOLD NEPTUNE II (INSTRUMENTS) ×3 IMPLANT
NS IRRIG 1000ML POUR BTL (IV SOLUTION) ×3 IMPLANT
PACK TOTAL KNEE CUSTOM (KITS) ×3 IMPLANT
PADDING CAST COTTON 6X4 STRL (CAST SUPPLIES) ×5 IMPLANT
PROTECTOR NERVE ULNAR (MISCELLANEOUS) ×3 IMPLANT
SET HNDPC FAN SPRY TIP SCT (DISPOSABLE) ×2 IMPLANT
SPIKE FLUID TRANSFER (MISCELLANEOUS) ×3 IMPLANT
STRIP CLOSURE SKIN 1/2X4 (GAUZE/BANDAGES/DRESSINGS) ×6 IMPLANT
SUT MNCRL AB 4-0 PS2 18 (SUTURE) ×3 IMPLANT
SUT STRATAFIX 0 PDS 27 VIOLET (SUTURE) ×2
SUT VIC AB 2-0 CT1 27 (SUTURE) ×6
SUT VIC AB 2-0 CT1 TAPERPNT 27 (SUTURE) ×6 IMPLANT
SUTURE STRATFX 0 PDS 27 VIOLET (SUTURE) ×2 IMPLANT
TIBIAL BASE ROT PLAT SZ 7 KNEE (Knees) ×2 IMPLANT
TRAY FOLEY MTR SLVR 16FR STAT (SET/KITS/TRAYS/PACK) ×2 IMPLANT
TUBE SUCTION HIGH CAP CLEAR NV (SUCTIONS) ×3 IMPLANT
WATER STERILE IRR 1000ML POUR (IV SOLUTION) ×6 IMPLANT
WRAP KNEE MAXI GEL POST OP (GAUZE/BANDAGES/DRESSINGS) ×3 IMPLANT

## 2022-03-04 NOTE — Anesthesia Preprocedure Evaluation (Signed)
Anesthesia Evaluation  Patient identified by MRN, date of birth, ID band Patient awake    Reviewed: Allergy & Precautions, NPO status , Patient's Chart, lab work & pertinent test results  Airway Mallampati: III  TM Distance: >3 FB Neck ROM: Full    Dental   Pulmonary former smoker,    breath sounds clear to auscultation       Cardiovascular hypertension, + DVT   Rhythm:Regular Rate:Normal     Neuro/Psych negative neurological ROS     GI/Hepatic GERD  ,(+)     substance abuse  alcohol use,   Endo/Other  negative endocrine ROS  Renal/GU negative Renal ROS     Musculoskeletal  (+) Arthritis ,   Abdominal   Peds  Hematology  (+) Blood dyscrasia (On eliquis. Last dose 8/4), ,   Anesthesia Other Findings   Reproductive/Obstetrics                             Lab Results  Component Value Date   WBC 7.5 02/20/2022   HGB 14.5 02/20/2022   HCT 41.4 02/20/2022   MCV 98.3 02/20/2022   PLT 159 02/20/2022   Lab Results  Component Value Date   CREATININE 0.98 02/20/2022   BUN 11 02/20/2022   NA 143 02/20/2022   K 4.2 02/20/2022   CL 101 02/20/2022   CO2 33 (H) 02/20/2022    Anesthesia Physical Anesthesia Plan  ASA: 3  Anesthesia Plan: General   Post-op Pain Management: Regional block* and Ofirmev IV (intra-op)*   Induction: Intravenous  PONV Risk Score and Plan: 2 and Dexamethasone, Ondansetron and Treatment may vary due to age or medical condition  Airway Management Planned: LMA  Additional Equipment:   Intra-op Plan:   Post-operative Plan: Extubation in OR  Informed Consent: I have reviewed the patients History and Physical, chart, labs and discussed the procedure including the risks, benefits and alternatives for the proposed anesthesia with the patient or authorized representative who has indicated his/her understanding and acceptance.     Dental advisory given  Plan  Discussed with: CRNA  Anesthesia Plan Comments:         Anesthesia Quick Evaluation

## 2022-03-04 NOTE — Care Plan (Signed)
Ortho Bundle Case Management Note  Patient Details  Name: [name] [name] MRN: [id number] Date of Birth: [birth year]  R TKA on 03-04-22 DCP:  Home with family DME:  RW ordered through [name] PT:  Galen Daft on 03-08-22                   DME Arranged:  Gilford Rile rolling DME Agency:  [employer]  HH Arranged:  NA [place] Agency:  NA  Additional Comments: Please contact me with any questions of if this plan should need to change.  Marianne Sofia, RN,CCM [facility]  [phone] 03/04/2022, 1:19 PM

## 2022-03-04 NOTE — Anesthesia Procedure Notes (Signed)
Procedure Name: LMA Insertion Date/Time: 03/04/2022 9:12 AM  Performed by: Mykaela Arena, Forest Gleason, CRNAPre-anesthesia Checklist: Emergency Drugs available, Patient identified, Suction available, Patient being monitored and Timeout performed Patient Re-evaluated:Patient Re-evaluated prior to induction Oxygen Delivery Method: Circle system utilized Preoxygenation: Pre-oxygenation with 100% oxygen Induction Type: IV induction Ventilation: Mask ventilation without difficulty LMA: LMA inserted and LMA with gastric port inserted LMA Size: 5.0 Number of attempts: 1 Tube secured with: Tape Dental Injury: Teeth and Oropharynx as per pre-operative assessment

## 2022-03-04 NOTE — Evaluation (Signed)
Physical Therapy Evaluation Patient Details Name: [name] [name] MRN: [id number] DOB: [birth year] Today's Date: 03/04/2022  History of Present Illness  76 yo male , S/P R TKA 03/04/22.pmh: dvt, COVID,  Clinical Impression  tHE PATIENT TOLERATED VERY WELL. pLANS dc TO HIS HOME WITH 2 DAUGHTERS ASSISTING. Pt admitted with above diagnosis.  Pt currently with functional limitations due to the deficits listed below (see PT Problem List). Pt will benefit from skilled PT to increase their independence and safety with mobility to allow discharge to the venue listed below.          Recommendations for follow up therapy are one component of a multi-disciplinary discharge planning process, led by the attending physician.  Recommendations may be updated based on patient status, additional functional criteria and insurance authorization.  Follow Up Recommendations Follow physician's recommendations for discharge plan and follow up therapies      Assistance Recommended at Discharge Intermittent Supervision/Assistance  Patient can return home with the following  A little help with walking and/or transfers;A little help with bathing/dressing/bathroom;Help with stairs or ramp for entrance;Assist for transportation;Assistance with cooking/housework    Equipment Recommendations Rolling walker (2 wheels)  Recommendations for Other Services       Functional Status Assessment Patient has had a recent decline in their functional status and demonstrates the ability to make significant improvements in function in a reasonable and predictable amount of time.     Precautions / Restrictions Precautions Precautions: Knee;Fall Required Braces or Orthoses: Knee Immobilizer - Right Knee Immobilizer - Right: Discontinue once straight leg raise with < 10 degree lag Restrictions RLE Weight Bearing: Weight bearing as tolerated      Mobility  Bed Mobility Overal bed mobility: Needs Assistance Bed Mobility: Supine  to Sit     Supine to sit: Min guard          Transfers Overall transfer level: Needs assistance Equipment used: Rolling walker (2 wheels) Transfers: Sit to/from Stand Sit to Stand: Min assist, From elevated surface           General transfer comment: cues for hanfd and right leg    Ambulation/Gait Ambulation/Gait assistance: Min assist Gait Distance (Feet): 80 Feet Assistive device: Rolling walker (2 wheels) Gait Pattern/deviations: Step-to pattern, Step-through pattern       General Gait Details: cues for sequence  Stairs            Wheelchair Mobility    Modified Rankin (Stroke Patients Only)       Balance Overall balance assessment: Mild deficits observed, not formally tested                                           Pertinent Vitals/Pain Pain Assessment Pain Assessment: Faces Faces Pain Scale: Hurts a little bit Pain Location: right knee Pain Descriptors / Indicators: Discomfort Pain Intervention(s): Premedicated before session, Monitored during session    Home Living Family/patient expects to be discharged to:: Private residence Living Arrangements: Alone Available Help at Discharge: Family;Available 24 hours/day Type of Home: Mobile home Home Access: Stairs to enter Entrance Stairs-Rails: Left Entrance Stairs-Number of Steps: 4   Home Layout: One level Home Equipment: Rollator (4 wheels);BSC/3in1      Prior Function Prior Level of Function : Independent/Modified Independent             Mobility Comments: still works  Hand Dominance        Extremity/Trunk Assessment   Upper Extremity Assessment Upper Extremity Assessment: Overall WFL for tasks assessed    Lower Extremity Assessment Lower Extremity Assessment: RLE deficits/detail    Cervical / Trunk Assessment Cervical / Trunk Assessment: Normal  Communication   Communication: HOH  Cognition Arousal/Alertness: Awake/alert Behavior During  Therapy: WFL for tasks assessed/performed Overall Cognitive Status: Within Functional Limits for tasks assessed                                          General Comments      Exercises     Assessment/Plan    PT Assessment Patient needs continued PT services  PT Problem List Decreased strength;Decreased mobility;Decreased safety awareness;Decreased range of motion;Decreased activity tolerance;Pain;Decreased knowledge of use of DME       PT Treatment Interventions DME instruction;Therapeutic activities;Gait training;Therapeutic exercise;Patient/family education;Stair training;Functional mobility training    PT Goals (Current goals can be found in the Care Plan section)  Acute Rehab PT Goals Patient Stated Goal: go home, no pain PT Goal Formulation: With patient/family Time For Goal Achievement: 03/11/22 Potential to Achieve Goals: Good    Frequency 7X/week     Co-evaluation               AM-PAC PT "6 Clicks" Mobility  Outcome Measure Help needed turning from your back to your side while in a flat bed without using bedrails?: A Little Help needed moving from lying on your back to sitting on the side of a flat bed without using bedrails?: A Little Help needed moving to and from a bed to a chair (including a wheelchair)?: A Little Help needed standing up from a chair using your arms (e.g., wheelchair or bedside chair)?: A Little Help needed to walk in hospital room?: A Little Help needed climbing 3-5 steps with a railing? : A Lot 6 Click Score: 17    End of Session Equipment Utilized During Treatment: Gait belt;Right knee immobilizer Activity Tolerance: Patient tolerated treatment well Patient left: in chair;with call bell/phone within reach;with chair alarm set;with family/visitor present Nurse Communication: Mobility status PT Visit Diagnosis: Unsteadiness on feet (R26.81);Difficulty in walking, not elsewhere classified (R26.2)    Time:  2585-2778 PT Time Calculation (min) (ACUTE ONLY): 28 min   Charges:   PT Evaluation $PT Eval Low Complexity: 1 Low PT Treatments $Gait Training: 8-22 mins         Tresa Endo [place] Office [phone] Weekend [phone]   Claretha Cooper 03/04/2022, 5:26 PM

## 2022-03-04 NOTE — Interval H&P Note (Signed)
History and Physical Interval Note:  03/04/2022 7:11 AM  [name] [name]  has presented today for surgery, with the diagnosis of right knee osteoarthritis.  The various methods of treatment have been discussed with the patient and family. After consideration of risks, benefits and other options for treatment, the patient has consented to  Procedure(s): TOTAL KNEE ARTHROPLASTY (Right) as a surgical intervention.  The patient's history has been reviewed, patient examined, no change in status, stable for surgery.  I have reviewed the patient's chart and labs.  Questions were answered to the patient's satisfaction.     [name]

## 2022-03-04 NOTE — Progress Notes (Signed)
Orthopedic Tech Progress Note Patient Details:  [name] [name] 12-Jul-1946 [id number]  Patient ID: [name] [name], male   DOB: 07-11-1946, 76 y.o.   MRN: [id number]  [name] 03/04/2022, 4:08 PM Cpm removed

## 2022-03-04 NOTE — Progress Notes (Signed)
Orthopedic Tech Progress Note Patient Details:  [name] [name] 11-12-1945 [id number]  Patient ID: [name] [name], male   DOB: [birth year], 76 y.o.   MRN: [id number]  [name] [name] 03/04/2022, 11:42 AM Right leg placed in cpm in pacu

## 2022-03-04 NOTE — Op Note (Signed)
OPERATIVE REPORT-TOTAL KNEE ARTHROPLASTY   Pre-operative diagnosis- Osteoarthritis  Right knee(s)  Post-operative diagnosis- Osteoarthritis Right knee(s)  Procedure-  Right  Total Knee Arthroplasty  Surgeon- [name] Plover. Danna Sewell, MD  Assistant- Molli Barrows, PA-C   Anesthesia-  GA combined with regional for post-op pain  EBL-50 mL   Drains None  Tourniquet time-  Total Tourniquet Time Documented: Thigh (Right) - 49 minutes Total: Thigh (Right) - 49 minutes     Complications- None  Condition-PACU - hemodynamically stable.   Brief Clinical Note  [name] [name] is a 76 y.o. year old male with end stage OA of his right knee with progressively worsening pain and dysfunction. He has constant pain, with activity and at rest and significant functional deficits with difficulties even with ADLs. He has had extensive non-op management including analgesics, injections of cortisone and viscosupplements, and home exercise program, but remains in significant pain with significant dysfunction. Radiographs show bone on bone arthritis medial and patellofemoral. He presents now for right Total Knee Arthroplasty.     Procedure in detail---   The patient is brought into the operating room and positioned supine on the operating table. After successful administration of  GA combined with regional for post-op pain,   a tourniquet is placed high on the  Right thigh(s) and the lower extremity is prepped and draped in the usual sterile fashion. Time out is performed by the operating team and then the  Right lower extremity is wrapped in Esmarch, knee flexed and the tourniquet inflated to 300 mmHg.       A midline incision is made with a ten blade through the subcutaneous tissue to the level of the extensor mechanism. A fresh blade is used to make a medial parapatellar arthrotomy. Soft tissue over the proximal medial tibia is subperiosteally elevated to the joint line with a knife and into the semimembranosus  bursa with a Cobb elevator. Soft tissue over the proximal lateral tibia is elevated with attention being paid to avoiding the patellar tendon on the tibial tubercle. The patella is everted, knee flexed 90 degrees and the ACL and PCL are removed. Findings are bone on bone medial and patellofemoral with large global osteophytes        The drill is used to create a starting hole in the distal femur and the canal is thoroughly irrigated with sterile saline to remove the fatty contents. The 5 degree Right  valgus alignment guide is placed into the femoral canal and the distal femoral cutting block is pinned to remove 9 mm off the distal femur. Resection is made with an oscillating saw.      The tibia is subluxed forward and the menisci are removed. The extramedullary alignment guide is placed referencing proximally at the medial aspect of the tibial tubercle and distally along the second metatarsal axis and tibial crest. The block is pinned to remove 33m off the more deficient medial  side. Resection is made with an oscillating saw. Size 7is the most appropriate size for the tibia and the proximal tibia is prepared with the modular drill and keel punch for that size.      The femoral sizing guide is placed and size 7 is most appropriate. Rotation is marked off the epicondylar axis and confirmed by creating a rectangular flexion gap at 90 degrees. The size 7 cutting block is pinned in this rotation and the anterior, posterior and chamfer cuts are made with the oscillating saw. The intercondylar block is then placed and  that cut is made.      Trial size 7 tibial component, trial size 7 posterior stabilized femur and a 8  mm posterior stabilized rotating platform insert trial is placed. Full extension is achieved with excellent varus/valgus and anterior/posterior balance throughout full range of motion. The patella is everted and thickness measured to be 27  mm. Free hand resection is taken to 15 mm, a 41 template is  placed, lug holes are drilled, trial patella is placed, and it tracks normally. Osteophytes are removed off the posterior femur with the trial in place. All trials are removed and the cut bone surfaces prepared with pulsatile lavage. Cement is mixed and once ready for implantation, the size 7 tibial implant, size  7 posterior stabilized femoral component, and the size 41 patella are cemented in place and the patella is held with the clamp. The trial insert is placed and the knee held in full extension. The Exparel (20 ml mixed with 60 ml saline) is injected into the extensor mechanism, posterior capsule, medial and lateral gutters and subcutaneous tissues.  All extruded cement is removed and once the cement is hard the permanent 8 mm posterior stabilized rotating platform insert is placed into the tibial tray.      The wound is copiously irrigated with saline solution and the extensor mechanism closed with # 0 Stratofix suture. The tourniquet is released for a total tourniquet time of 49  minutes. Flexion against gravity is 140 degrees and the patella tracks normally. Subcutaneous tissue is closed with 2.0 vicryl and subcuticular with running 4.0 Monocryl. The incision is cleaned and dried and steri-strips and a bulky sterile dressing are applied. The limb is placed into a knee immobilizer and the patient is awakened and transported to recovery in stable condition.      Please note that a surgical assistant was a medical necessity for this procedure in order to perform it in a safe and expeditious manner. Surgical assistant was necessary to retract the ligaments and vital neurovascular structures to prevent injury to them and also necessary for proper positioning of the limb to allow for anatomic placement of the prosthesis.   [name] Plover [name], MD    03/04/2022, 10:33 AM

## 2022-03-04 NOTE — Discharge Instructions (Signed)
 [name] Aluisio, MD Total Joint Specialist [facility] [place] [address] [place], [place] [address] [phone]  TOTAL KNEE REPLACEMENT POSTOPERATIVE DIRECTIONS    Knee Rehabilitation, Guidelines Following Surgery  Results after knee surgery are often greatly improved when you follow the exercise, range of motion and muscle strengthening exercises prescribed by your doctor. Safety measures are also important to protect the knee from further injury. If any of these exercises cause you to have increased pain or swelling in your knee joint, decrease the amount until you are comfortable again and slowly increase them. If you have problems or questions, call your caregiver or physical therapist for advice.   HOME CARE INSTRUCTIONS  Remove items at home which could result in a fall. This includes throw rugs or furniture in walking pathways.  ICE to the affected knee as much as tolerated. Icing helps control swelling. If the swelling is well controlled you will be more comfortable and rehab easier. Continue to use ice on the knee for pain and swelling from surgery. You may notice swelling that will progress down to the foot and ankle. This is normal after surgery. Elevate the leg when you are not up walking on it.    Continue to use the breathing machine which will help keep your temperature down. It is common for your temperature to cycle up and down following surgery, especially at night when you are not up moving around and exerting yourself. The breathing machine keeps your lungs expanded and your temperature down. Do not place pillow under the operative knee, focus on keeping the knee straight while resting  DIET You may resume your previous home diet once you are discharged from the hospital.  DRESSING / WOUND CARE / SHOWERING Keep your bulky bandage on for 2 days. On the third post-operative day you may remove the Ace bandage and gauze. There is a waterproof  adhesive bandage on your skin which will stay in place until your first follow-up appointment. Once you remove this you will not need to place another bandage You may begin showering 3 days following surgery, but do not submerge the incision under water.  ACTIVITY For the first 5 days, the key is rest and control of pain and swelling Do your home exercises twice a day starting on post-operative day 3. On the days you go to physical therapy, just do the home exercises once that day. You should rest, ice and elevate the leg for 50 minutes out of every hour. Get up and walk/stretch for 10 minutes per hour. After 5 days you can increase your activity slowly as tolerated. Walk with your walker as instructed. Use the walker until you are comfortable transitioning to a cane. Walk with the cane in the opposite hand of the operative leg. You may discontinue the cane once you are comfortable and walking steadily. Avoid periods of inactivity such as sitting longer than an hour when not asleep. This helps prevent blood clots.  You may discontinue the knee immobilizer once you are able to perform a straight leg raise while lying down. You may resume a sexual relationship in one month or when given the OK by your doctor.  You may return to work once you are cleared by your doctor.  Do not drive a car for 6 weeks or until released by your surgeon.  Do not drive while taking narcotics.  TED HOSE STOCKINGS Wear the elastic stockings on both legs for three weeks following surgery during the   day. You may remove them at night for sleeping.  WEIGHT BEARING Weight bearing as tolerated with assist device (walker, cane, etc) as directed, use it as long as suggested by your surgeon or therapist, typically at least 4-6 weeks.  POSTOPERATIVE CONSTIPATION PROTOCOL Constipation - defined medically as fewer than three stools per week and severe constipation as less than one stool per week.  One of the most common issues  patients have following surgery is constipation.  Even if you have a regular bowel pattern at home, your normal regimen is likely to be disrupted due to multiple reasons following surgery.  Combination of anesthesia, postoperative narcotics, change in appetite and fluid intake all can affect your bowels.  In order to avoid complications following surgery, here are some recommendations in order to help you during your recovery period.  Colace (docusate) - Pick up an over-the-counter form of Colace or another stool softener and take twice a day as long as you are requiring postoperative pain medications.  Take with a full glass of water daily.  If you experience loose stools or diarrhea, hold the colace until you stool forms back up. If your symptoms do not get better within 1 week or if they get worse, check with your doctor. Dulcolax (bisacodyl) - Pick up over-the-counter and take as directed by the product packaging as needed to assist with the movement of your bowels.  Take with a full glass of water.  Use this product as needed if not relieved by Colace only.  MiraLax (polyethylene glycol) - Pick up over-the-counter to have on hand. MiraLax is a solution that will increase the amount of water in your bowels to assist with bowel movements.  Take as directed and can mix with a glass of water, juice, soda, coffee, or tea. Take if you go more than two days without a movement. Do not use MiraLax more than once per day. Call your doctor if you are still constipated or irregular after using this medication for 7 days in a row.  If you continue to have problems with postoperative constipation, please contact the office for further assistance and recommendations.  If you experience "the worst abdominal pain ever" or develop nausea or vomiting, please contact the office immediatly for further recommendations for treatment.  ITCHING If you experience itching with your medications, try taking only a single pain  pill, or even half a pain pill at a time.  You can also use Benadryl over the counter for itching or also to help with sleep.   MEDICATIONS See your medication summary on the "After Visit Summary" that the nursing staff will review with you prior to discharge.  You may have some home medications which will be placed on hold until you complete the course of blood thinner medication.  It is important for you to complete the blood thinner medication as prescribed by your surgeon.  Continue your approved medications as instructed at time of discharge.  PRECAUTIONS If you experience chest pain or shortness of breath - call 911 immediately for transfer to the hospital emergency department.  If you develop a fever greater that 101 F, purulent drainage from wound, increased redness or drainage from wound, foul odor from the wound/dressing, or calf pain - CONTACT YOUR SURGEON.                                                     FOLLOW-UP APPOINTMENTS Make sure you keep all of your appointments after your operation with your surgeon and caregivers. You should call the office at the above phone number and make an appointment for approximately two weeks after the date of your surgery or on the date instructed by your surgeon outlined in the "After Visit Summary".  RANGE OF MOTION AND STRENGTHENING EXERCISES  Rehabilitation of the knee is important following a knee injury or an operation. After just a few days of immobilization, the muscles of the thigh which control the knee become weakened and shrink (atrophy). Knee exercises are designed to build up the tone and strength of the thigh muscles and to improve knee motion. Often times heat used for twenty to thirty minutes before working out will loosen up your tissues and help with improving the range of motion but do not use heat for the first two weeks following surgery. These exercises can be done on a training (exercise) mat, on the floor, on a table or on a bed.  Use what ever works the best and is most comfortable for you Knee exercises include:  Leg Lifts - While your knee is still immobilized in a splint or cast, you can do straight leg raises. Lift the leg to 60 degrees, hold for 3 sec, and slowly lower the leg. Repeat 10-20 times 2-3 times daily. Perform this exercise against resistance later as your knee gets better.  Quad and Hamstring Sets - Tighten up the muscle on the front of the thigh (Quad) and hold for 5-10 sec. Repeat this 10-20 times hourly. Hamstring sets are done by pushing the foot backward against an object and holding for 5-10 sec. Repeat as with quad sets.  Leg Slides: Lying on your back, slowly slide your foot toward your buttocks, bending your knee up off the floor (only go as far as is comfortable). Then slowly slide your foot back down until your leg is flat on the floor again. Angel Wings: Lying on your back spread your legs to the side as far apart as you can without causing discomfort.  A rehabilitation program following serious knee injuries can speed recovery and prevent re-injury in the future due to weakened muscles. Contact your doctor or a physical therapist for more information on knee rehabilitation.   POST-OPERATIVE OPIOID TAPER INSTRUCTIONS: It is important to wean off of your opioid medication as soon as possible. If you do not need pain medication after your surgery it is ok to stop day one. Opioids include: Codeine, Hydrocodone(Norco, Vicodin), Oxycodone(Percocet, oxycontin) and hydromorphone amongst others.  Long term and even short term use of opiods can cause: Increased pain response Dependence Constipation Depression Respiratory depression And more.  Withdrawal symptoms can include Flu like symptoms Nausea, vomiting And more Techniques to manage these symptoms Hydrate well Eat regular healthy meals Stay active Use relaxation techniques(deep breathing, meditating, yoga) Do Not substitute Alcohol to help  with tapering If you have been on opioids for less than two weeks and do not have pain than it is ok to stop all together.  Plan to wean off of opioids This plan should start within one week post op of your joint replacement. Maintain the same interval or time between taking each dose and first decrease the dose.  Cut the total daily intake of opioids by one tablet each day Next start to increase the time between doses. The last dose that should be eliminated is the evening dose.   IF YOU ARE TRANSFERRED TO   A SKILLED REHAB FACILITY If the patient is transferred to a skilled rehab facility following release from the hospital, a list of the current medications will be sent to the facility for the patient to continue.  When discharged from the skilled rehab facility, please have the facility set up the patient's Home Health Physical Therapy prior to being released. Also, the skilled facility will be responsible for providing the patient with their medications at time of release from the facility to include their pain medication, the muscle relaxants, and their blood thinner medication. If the patient is still at the rehab facility at time of the two week follow up appointment, the skilled rehab facility will also need to assist the patient in arranging follow up appointment in our office and any transportation needs.  MAKE SURE YOU:  Understand these instructions.  Get help right away if you are not doing well or get worse.   DENTAL ANTIBIOTICS:  In most cases prophylactic antibiotics for Dental procdeures after total joint surgery are not necessary.  Exceptions are as follows:  1. History of prior total joint infection  2. Severely immunocompromised (Organ Transplant, cancer chemotherapy, Rheumatoid biologic medications such as Humera)  3. Poorly controlled diabetes (A1C &gt; 8.0, blood glucose over 200)  If you have one of these conditions, contact your surgeon for an antibiotic  prescription, prior to your dental procedure.    Pick up stool softner and laxative for home use following surgery while on pain medications. Do not submerge incision under water. Please use good hand washing techniques while changing dressing each day. May shower starting three days after surgery. Please use a clean towel to pat the incision dry following showers. Continue to use ice for pain and swelling after surgery. Do not use any lotions or creams on the incision until instructed by your surgeon.  

## 2022-03-04 NOTE — Anesthesia Postprocedure Evaluation (Signed)
Anesthesia Post Note  Patient: [name] [name]  Procedure(s) Performed: TOTAL KNEE ARTHROPLASTY (Right: Knee)     Patient location during evaluation: PACU Anesthesia Type: General Level of consciousness: awake and alert Pain management: pain level controlled Vital Signs Assessment: post-procedure vital signs reviewed and stable Respiratory status: spontaneous breathing, nonlabored ventilation, respiratory function stable and patient connected to nasal cannula oxygen Cardiovascular status: blood pressure returned to baseline and stable Postop Assessment: no apparent nausea or vomiting Anesthetic complications: no   No notable events documented.  Last Vitals:  Vitals:   03/04/22 1251 03/04/22 1501  BP: (!) 143/77 115/77  Pulse: (!) 53 63  Resp: 16 18  Temp: 36.7 C 36.6 C  SpO2: 94% 99%    Last Pain:  Vitals:   03/04/22 1501  TempSrc: Oral  PainSc:                  Tiajuana Amass

## 2022-03-04 NOTE — Plan of Care (Signed)
Problem: Education: Goal: Knowledge of General Education information will improve Description: Including pain rating scale, medication(s)/side effects and non-pharmacologic comfort measures Outcome: Progressing   Problem: Clinical Measurements: Goal: Ability to maintain clinical measurements within normal limits will improve Outcome: Progressing   Problem: Pain Managment: Goal: General experience of comfort will improve Outcome: Progressing   Ivan Anchors, RN. 03/04/22 5:49 PM

## 2022-03-04 NOTE — Transfer of Care (Signed)
Immediate Anesthesia Transfer of Care Note  Patient: [name] [name]  Procedure(s) Performed: TOTAL KNEE ARTHROPLASTY (Right: Knee)  Patient Location: PACU  Anesthesia Type:General  Level of Consciousness: awake  Airway & Oxygen Therapy: Patient Spontanous Breathing  Post-op Assessment: Report given to RN  Post vital signs: stable  Last Vitals:  Vitals Value Taken Time  BP 161/93 03/04/22 1100  Temp    Pulse 73 03/04/22 1101  Resp 10 03/04/22 1101  SpO2 100 % 03/04/22 1101  Vitals shown include unvalidated device data.  Last Pain:  Vitals:   03/04/22 0850  TempSrc:   PainSc: 0-No pain         Complications: No notable events documented.

## 2022-03-04 NOTE — Anesthesia Procedure Notes (Signed)
Anesthesia Regional Block: Adductor canal block   Pre-Anesthetic Checklist: , timeout performed,  Correct Patient, Correct Site, Correct Laterality,  Correct Procedure, Correct Position, site marked,  Risks and benefits discussed,  Surgical consent,  Pre-op evaluation,  At surgeon's request and post-op pain management  Laterality: Right  Prep: chloraprep       Needles:  Injection technique: Single-shot  Needle Type: Echogenic Needle     Needle Length: 9cm  Needle Gauge: 21     Additional Needles:   Procedures:,,,, ultrasound used (permanent image in chart),,    Narrative:  Start time: 03/04/2022 8:37 AM End time: 03/04/2022 8:42 AM Injection made incrementally with aspirations every 5 mL.  Performed by: Personally  Anesthesiologist: Suzette Battiest, MD

## 2022-03-05 ENCOUNTER — Encounter (HOSPITAL_COMMUNITY): Payer: Self-pay | Admitting: Orthopedic Surgery

## 2022-03-05 ENCOUNTER — Ambulatory Visit: Payer: Medicare HMO

## 2022-03-05 DIAGNOSIS — Z79899 Other long term (current) drug therapy: Secondary | ICD-10-CM | POA: Diagnosis not present

## 2022-03-05 DIAGNOSIS — Z7901 Long term (current) use of anticoagulants: Secondary | ICD-10-CM | POA: Diagnosis not present

## 2022-03-05 DIAGNOSIS — Z87891 Personal history of nicotine dependence: Secondary | ICD-10-CM | POA: Diagnosis not present

## 2022-03-05 DIAGNOSIS — M1711 Unilateral primary osteoarthritis, right knee: Secondary | ICD-10-CM | POA: Diagnosis not present

## 2022-03-05 DIAGNOSIS — M25561 Pain in right knee: Secondary | ICD-10-CM | POA: Diagnosis not present

## 2022-03-05 DIAGNOSIS — I10 Essential (primary) hypertension: Secondary | ICD-10-CM | POA: Diagnosis not present

## 2022-03-05 DIAGNOSIS — Z86718 Personal history of other venous thrombosis and embolism: Secondary | ICD-10-CM | POA: Diagnosis not present

## 2022-03-05 DIAGNOSIS — Z86711 Personal history of pulmonary embolism: Secondary | ICD-10-CM | POA: Diagnosis not present

## 2022-03-05 LAB — BASIC METABOLIC PANEL
Anion gap: 12 (ref 5–15)
BUN: 19 mg/dL (ref 8–23)
CO2: 27 mmol/L (ref 22–32)
Calcium: 8.7 mg/dL — ABNORMAL LOW (ref 8.9–10.3)
Chloride: 100 mmol/L (ref 98–111)
Creatinine, Ser: 1.22 mg/dL (ref 0.61–1.24)
GFR, Estimated: >60 mL/min (ref >60)
Glucose, Bld: 187 mg/dL — ABNORMAL HIGH (ref 70–99)
Potassium: 4.6 mmol/L (ref 3.5–5.1)
Sodium: 139 mmol/L (ref 135–145)

## 2022-03-05 LAB — CBC
HCT: 39.1 % (ref 39.0–52.0)
Hemoglobin: 13.1 g/dL (ref 13.0–17.0)
MCH: 35 pg — ABNORMAL HIGH (ref 26.0–34.0)
MCHC: 33.5 g/dL (ref 30.0–36.0)
MCV: 104.5 fL — ABNORMAL HIGH (ref 80.0–100.0)
Platelets: 193 10*3/uL (ref 150–400)
RBC: 3.74 MIL/uL — ABNORMAL LOW (ref 4.22–5.81)
RDW: 13 % (ref 11.5–15.5)
WBC: 18.1 10*3/uL — ABNORMAL HIGH (ref 4.0–10.5)
nRBC: 0 % (ref 0.0–0.2)

## 2022-03-05 MED ORDER — TRAMADOL HCL 50 MG PO TABS: 50.0000 mg | ORAL_TABLET | Freq: Four times a day (QID) | ORAL | 0 refills | Status: DC | PRN

## 2022-03-05 MED ORDER — OXYCODONE HCL 5 MG PO TABS: 5.0000 mg | ORAL_TABLET | Freq: Four times a day (QID) | ORAL | 0 refills | Status: DC | PRN

## 2022-03-05 MED ORDER — METHOCARBAMOL 500 MG PO TABS
500.0000 mg | ORAL_TABLET | Freq: Four times a day (QID) | ORAL | 0 refills | Status: DC | PRN
Start: 2022-03-05 — End: 2022-08-19

## 2022-03-05 NOTE — Progress Notes (Signed)
Physical Therapy Treatment Patient Details Name: [name] [name] MRN: [id number] DOB: 1945-08-13 Today's Date: 03/05/2022   History of Present Illness 76 yo male , S/P RTLA 03/04/22.    PT Comments    POD # 1 pm session Pt eager to go home.  "Just got back from the bathroom", feeling good.  Assisted with amb in hallway again with Daughter and practiced stairs.  This time, pt was able to correctly recall proper sequencing.  Completed remaining TE's following HEP handout.  Addressed all mobility questions, discussed appropriate activity, educated on use of ICE.  Pt ready for D/C to home.   Recommendations for follow up therapy are one component of a multi-disciplinary discharge planning process, led by the attending physician.  Recommendations may be updated based on patient status, additional functional criteria and insurance authorization.  Follow Up Recommendations  Follow physician's recommendations for discharge plan and follow up therapies     Assistance Recommended at Discharge Intermittent Supervision/Assistance  Patient can return home with the following A little help with walking and/or transfers;A little help with bathing/dressing/bathroom;Help with stairs or ramp for entrance;Assist for transportation;Assistance with cooking/housework   Equipment Recommendations  Rolling walker (2 wheels)    Recommendations for Other Services       Precautions / Restrictions Precautions Precautions: Knee;Fall Precaution Comments: instructed no pillow under Restrictions Weight Bearing Restrictions: No RLE Weight Bearing: Weight bearing as tolerated     Mobility  Bed Mobility Overal bed mobility: Needs Assistance Bed Mobility: Supine to Sit     Supine to sit: Supervision     General bed mobility comments: OOB in recliner    Transfers Overall transfer level: Needs assistance Equipment used: Rolling walker (2 wheels) Transfers: Sit to/from Stand Sit to Stand: Supervision, Min  guard           General transfer comment: 25% VC's on proper hand placement and safety with turns    Ambulation/Gait Ambulation/Gait assistance: Supervision, Min guard Gait Distance (Feet): 95 Feet Assistive device: Rolling walker (2 wheels) Gait Pattern/deviations: Step-to pattern, Step-through pattern Gait velocity: decreased     General Gait Details: had daughter "hands on" asisst pt with amb a functional distance in hallway.   Stairs Stairs: Yes Stairs assistance: Min guard, Min assist Stair Management: Two rails, Forwards, With walker Number of Stairs: 2 General stair comments: 50% VC"s on proper sequencing.  Had Daughter "hands on" asisst up/down 2 steps using B rails   Wheelchair Mobility    Modified Rankin (Stroke Patients Only)       Balance                                            Cognition Arousal/Alertness: Awake/alert Behavior During Therapy: WFL for tasks assessed/performed Overall Cognitive Status: Within Functional Limits for tasks assessed                                 General Comments: AxOx 3 motivated        Exercises  05 reps all seated TE's Followed by ICE    General Comments        Pertinent Vitals/Pain Pain Assessment Pain Assessment: 0-10 Pain Score: 4  Pain Location: right knee Pain Descriptors / Indicators: Discomfort, Grimacing, Operative site guarding Pain Intervention(s): Monitored during session, Premedicated before session, Repositioned, Ice  applied    Home Living                          Prior Function            PT Goals (current goals can now be found in the care plan section) Progress towards PT goals: Progressing toward goals    Frequency    7X/week      PT Plan Current plan remains appropriate    Co-evaluation              AM-PAC PT "6 Clicks" Mobility   Outcome Measure  Help needed turning from your back to your side while in a flat bed  without using bedrails?: A Little Help needed moving from lying on your back to sitting on the side of a flat bed without using bedrails?: A Little Help needed moving to and from a bed to a chair (including a wheelchair)?: A Little Help needed standing up from a chair using your arms (e.g., wheelchair or bedside chair)?: A Little Help needed to walk in hospital room?: A Little Help needed climbing 3-5 steps with a railing? : A Little 6 Click Score: 18    End of Session Equipment Utilized During Treatment: Gait belt Activity Tolerance: Patient tolerated treatment well Patient left: in chair;with call bell/phone within reach;with chair alarm set;with family/visitor present Nurse Communication: Mobility status PT Visit Diagnosis: Unsteadiness on feet (R26.81);Difficulty in walking, not elsewhere classified (R26.2)     Time: 9935-7017 PT Time Calculation (min) (ACUTE ONLY): 14 min  Charges:  $Gait Training: 8-22 mins                      Rica Koyanagi  PTA [place] Office M-F          [phone] Weekend pager [phone]

## 2022-03-05 NOTE — TOC Transition Note (Signed)
Transition of Care (TOC) - CM/SW Discharge Note   Patient Details  Name: [name] [name] MRN: [id number] Date of Birth: 09/10/1945  Transition of Care (TOC) CM/SW Contact:  HOYLE, LUCY, LCSW Phone Number: 03/05/2022, 11:21 AM   Clinical Narrative:     Met with pt and confirming he has received his RW via [employer].  OPPT set up with [facility].  No TOC needs.  Final next level of care: OP Rehab Barriers to Discharge: No Barriers Identified   Patient Goals and CMS Choice Patient states their goals for this hospitalization and ongoing recovery are:: return home      Discharge Placement                       Discharge Plan and Services                DME Arranged: Walker rolling DME Agency: [employer]       HH Arranged: NA HH Agency: NA        Social Determinants of Health (SDOH) Interventions     Readmission Risk Interventions     No data to display             

## 2022-03-05 NOTE — Plan of Care (Signed)
Problem: Education: Goal: Knowledge of General Education information will improve Description: Including pain rating scale, medication(s)/side effects and non-pharmacologic comfort measures 03/05/2022 1359 by Maryelizabeth Rowan, RN Outcome: Completed/Met 03/05/2022 1359 by Maryelizabeth Rowan, RN Outcome: Adequate for Discharge 03/05/2022 0848 by Maryelizabeth Rowan, RN Outcome: Progressing   Problem: Health Behavior/Discharge Planning: Goal: Ability to manage health-related needs will improve 03/05/2022 1359 by Maryelizabeth Rowan, RN Outcome: Completed/Met 03/05/2022 1359 by Maryelizabeth Rowan, RN Outcome: Adequate for Discharge 03/05/2022 0848 by Maryelizabeth Rowan, RN Outcome: Progressing   Problem: Clinical Measurements: Goal: Ability to maintain clinical measurements within normal limits will improve 03/05/2022 1359 by Maryelizabeth Rowan, RN Outcome: Completed/Met 03/05/2022 1359 by Maryelizabeth Rowan, RN Outcome: Adequate for Discharge 03/05/2022 0848 by Maryelizabeth Rowan, RN Outcome: Progressing Goal: Will remain free from infection 03/05/2022 1359 by Maryelizabeth Rowan, RN Outcome: Completed/Met 03/05/2022 1359 by Maryelizabeth Rowan, RN Outcome: Adequate for Discharge 03/05/2022 0848 by Maryelizabeth Rowan, RN Outcome: Progressing Goal: Diagnostic test results will improve 03/05/2022 1359 by Maryelizabeth Rowan, RN Outcome: Completed/Met 03/05/2022 1359 by Maryelizabeth Rowan, RN Outcome: Adequate for Discharge Goal: Respiratory complications will improve 03/05/2022 1359 by Maryelizabeth Rowan, RN Outcome: Completed/Met 03/05/2022 1359 by Maryelizabeth Rowan, RN Outcome: Adequate for Discharge Goal: Cardiovascular complication will be avoided 03/05/2022 1359 by Maryelizabeth Rowan, RN Outcome: Completed/Met 03/05/2022 1359 by Maryelizabeth Rowan, RN Outcome: Adequate for Discharge   Problem: Activity: Goal: Risk for activity intolerance will decrease 03/05/2022 1359 by Maryelizabeth Rowan, RN Outcome: Completed/Met 03/05/2022 1359 by Maryelizabeth Rowan,  RN Outcome: Adequate for Discharge   Problem: Nutrition: Goal: Adequate nutrition will be maintained 03/05/2022 1359 by Maryelizabeth Rowan, RN Outcome: Completed/Met 03/05/2022 1359 by Maryelizabeth Rowan, RN Outcome: Adequate for Discharge 03/05/2022 0848 by Maryelizabeth Rowan, RN Outcome: Progressing   Problem: Coping: Goal: Level of anxiety will decrease 03/05/2022 1359 by Maryelizabeth Rowan, RN Outcome: Completed/Met 03/05/2022 1359 by Maryelizabeth Rowan, RN Outcome: Adequate for Discharge 03/05/2022 0848 by Maryelizabeth Rowan, RN Outcome: Progressing   Problem: Elimination: Goal: Will not experience complications related to bowel motility 03/05/2022 1359 by Maryelizabeth Rowan, RN Outcome: Completed/Met 03/05/2022 1359 by Maryelizabeth Rowan, RN Outcome: Adequate for Discharge 03/05/2022 0848 by Maryelizabeth Rowan, RN Outcome: Progressing Goal: Will not experience complications related to urinary retention 03/05/2022 1359 by Maryelizabeth Rowan, RN Outcome: Completed/Met 03/05/2022 1359 by Maryelizabeth Rowan, RN Outcome: Adequate for Discharge 03/05/2022 0848 by Maryelizabeth Rowan, RN Outcome: Progressing   Problem: Pain Managment: Goal: General experience of comfort will improve 03/05/2022 1359 by Maryelizabeth Rowan, RN Outcome: Completed/Met 03/05/2022 1359 by Maryelizabeth Rowan, RN Outcome: Adequate for Discharge 03/05/2022 0848 by Maryelizabeth Rowan, RN Outcome: Progressing   Problem: Safety: Goal: Ability to remain free from injury will improve 03/05/2022 1359 by Maryelizabeth Rowan, RN Outcome: Completed/Met 03/05/2022 1359 by Maryelizabeth Rowan, RN Outcome: Adequate for Discharge 03/05/2022 0848 by Maryelizabeth Rowan, RN Outcome: Progressing   Problem: Skin Integrity: Goal: Risk for impaired skin integrity will decrease 03/05/2022 1359 by Maryelizabeth Rowan, RN Outcome: Completed/Met 03/05/2022 1359 by Maryelizabeth Rowan, RN Outcome: Adequate for Discharge   Problem: Acute Rehab PT Goals(only PT should resolve) Goal: Pt Will Go Supine/Side To  Sit Outcome: Completed/Met Goal: Pt Will Go Sit To Supine/Side Outcome: Completed/Met Goal: Patient Will Transfer Sit To/From Stand Outcome: Completed/Met Goal: Pt Will Ambulate Outcome: Completed/Met  Goal: Pt Will Go Up/Down Stairs Outcome: Completed/Met Goal: Pt/caregiver will Perform Home Exercise Program Outcome: Completed/Met   Problem: Education: Goal: Knowledge of the prescribed therapeutic regimen will improve 03/05/2022 1359 by Maryelizabeth Rowan, RN Outcome: Completed/Met 03/05/2022 1359 by Maryelizabeth Rowan, RN Outcome: Adequate for Discharge Goal: Individualized Educational Video(s) 03/05/2022 1359 by Maryelizabeth Rowan, RN Outcome: Completed/Met 03/05/2022 1359 by Maryelizabeth Rowan, RN Outcome: Adequate for Discharge   Problem: Activity: Goal: Ability to avoid complications of mobility impairment will improve 03/05/2022 1359 by Maryelizabeth Rowan, RN Outcome: Completed/Met 03/05/2022 1359 by Maryelizabeth Rowan, RN Outcome: Adequate for Discharge Goal: Range of joint motion will improve 03/05/2022 1359 by Maryelizabeth Rowan, RN Outcome: Completed/Met 03/05/2022 1359 by Maryelizabeth Rowan, RN Outcome: Adequate for Discharge   Problem: Clinical Measurements: Goal: Postoperative complications will be avoided or minimized 03/05/2022 1359 by Maryelizabeth Rowan, RN Outcome: Completed/Met 03/05/2022 1359 by Maryelizabeth Rowan, RN Outcome: Adequate for Discharge   Problem: Pain Management: Goal: Pain level will decrease with appropriate interventions 03/05/2022 1359 by Maryelizabeth Rowan, RN Outcome: Completed/Met 03/05/2022 1359 by Maryelizabeth Rowan, RN Outcome: Adequate for Discharge   Problem: Skin Integrity: Goal: Will show signs of wound healing 03/05/2022 1359 by Maryelizabeth Rowan, RN Outcome: Completed/Met 03/05/2022 1359 by Maryelizabeth Rowan, RN Outcome: Adequate for Discharge

## 2022-03-05 NOTE — Progress Notes (Signed)
Physical Therapy Treatment Patient Details Name: [name] [name] MRN: [id number] DOB: 07/16/1946 Today's Date: 03/05/2022   History of Present Illness 76 yo male , S/P RTLA 03/04/22.    PT Comments    POD # 1 am session Pt AxO x 3 eager to "go home".  Daughter present during session.  Asissted OOB to amb in hallway and practice stairs.  Then returned to room to perform some TE's following HEP handout.  Instructed on proper tech, freq as well as use of ICE.   Pt will need another PT session to complete HEP and repeat stair training.   Recommendations for follow up therapy are one component of a multi-disciplinary discharge planning process, led by the attending physician.  Recommendations may be updated based on patient status, additional functional criteria and insurance authorization.  Follow Up Recommendations  Follow physician's recommendations for discharge plan and follow up therapies     Assistance Recommended at Discharge Intermittent Supervision/Assistance  Patient can return home with the following A little help with walking and/or transfers;A little help with bathing/dressing/bathroom;Help with stairs or ramp for entrance;Assist for transportation;Assistance with cooking/housework   Equipment Recommendations  Rolling walker (2 wheels)    Recommendations for Other Services       Precautions / Restrictions Precautions Precautions: Knee;Fall Precaution Comments: instructed no pillow under Restrictions Weight Bearing Restrictions: No RLE Weight Bearing: Weight bearing as tolerated     Mobility  Bed Mobility Overal bed mobility: Needs Assistance Bed Mobility: Supine to Sit     Supine to sit: Supervision     General bed mobility comments: demonstarted and instructed on how to use belt to salf assist LE    Transfers Overall transfer level: Needs assistance Equipment used: Rolling walker (2 wheels) Transfers: Sit to/from Stand Sit to Stand: Supervision, Min  guard           General transfer comment: 25% VC's on proper hand placement and safety with turns    Ambulation/Gait Ambulation/Gait assistance: Supervision, Min guard Gait Distance (Feet): 95 Feet Assistive device: Rolling walker (2 wheels) Gait Pattern/deviations: Step-to pattern, Step-through pattern Gait velocity: decreased     General Gait Details: had daughter "hands on" asisst pt with amb a functional distance in hallway.   Stairs Stairs: Yes Stairs assistance: Min guard, Min assist Stair Management: Two rails, Forwards, With walker Number of Stairs: 2 General stair comments: 50% VC"s on proper sequencing.  Had Daughter "hands on" asisst up/down 2 steps using B rails   Wheelchair Mobility    Modified Rankin (Stroke Patients Only)       Balance                                            Cognition Arousal/Alertness: Awake/alert Behavior During Therapy: WFL for tasks assessed/performed Overall Cognitive Status: Within Functional Limits for tasks assessed                                 General Comments: AxOx 3 motivated        Exercises  Total Knee Replacement TE's following HEP handout 10 reps B LE ankle pumps 05 reps towel squeezes 05 reps knee presses 05 reps heel slides  05 reps SAQ's 05 reps SLR's 05 reps ABD Educated on use of gait belt to assist with TE's Followed by  ICE     General Comments        Pertinent Vitals/Pain Pain Assessment Pain Assessment: 0-10 Pain Score: 4  Pain Location: right knee Pain Descriptors / Indicators: Discomfort, Grimacing, Operative site guarding Pain Intervention(s): Monitored during session, Premedicated before session, Repositioned, Ice applied    Home Living                          Prior Function            PT Goals (current goals can now be found in the care plan section) Progress towards PT goals: Progressing toward goals    Frequency     7X/week      PT Plan Current plan remains appropriate    Co-evaluation              AM-PAC PT "6 Clicks" Mobility   Outcome Measure  Help needed turning from your back to your side while in a flat bed without using bedrails?: A Little Help needed moving from lying on your back to sitting on the side of a flat bed without using bedrails?: A Little Help needed moving to and from a bed to a chair (including a wheelchair)?: A Little Help needed standing up from a chair using your arms (e.g., wheelchair or bedside chair)?: A Little Help needed to walk in hospital room?: A Little Help needed climbing 3-5 steps with a railing? : A Little 6 Click Score: 18    End of Session Equipment Utilized During Treatment: Gait belt Activity Tolerance: Patient tolerated treatment well Patient left: in chair;with call bell/phone within reach;with chair alarm set;with family/visitor present Nurse Communication: Mobility status PT Visit Diagnosis: Unsteadiness on feet (R26.81);Difficulty in walking, not elsewhere classified (R26.2)     Time: 0910-0950 PT Time Calculation (min) (ACUTE ONLY): 40 min  Charges:  $Gait Training: 8-22 mins $Therapeutic Exercise: 8-22 mins $Therapeutic Activity: 8-22 mins                     Rica Koyanagi  PTA Acute  Rehabilitation Services Office M-F          [phone] Weekend pager [phone]

## 2022-03-05 NOTE — Plan of Care (Signed)
  Problem: Education: Goal: Knowledge of General Education information will improve Description: Including pain rating scale, medication(s)/side effects and non-pharmacologic comfort measures Outcome: Progressing   Problem: Health Behavior/Discharge Planning: Goal: Ability to manage health-related needs will improve Outcome: Progressing   Problem: Clinical Measurements: Goal: Ability to maintain clinical measurements within normal limits will improve Outcome: Progressing Goal: Will remain free from infection Outcome: Progressing   Problem: Nutrition: Goal: Adequate nutrition will be maintained Outcome: Progressing   Problem: Coping: Goal: Level of anxiety will decrease Outcome: Progressing   Problem: Elimination: Goal: Will not experience complications related to bowel motility Outcome: Progressing Goal: Will not experience complications related to urinary retention Outcome: Progressing   Problem: Pain Managment: Goal: General experience of comfort will improve Outcome: Progressing   Problem: Safety: Goal: Ability to remain free from injury will improve Outcome: Progressing   

## 2022-03-05 NOTE — Progress Notes (Signed)
   Subjective: 1 Day Post-Op Procedure(s) (LRB): TOTAL KNEE ARTHROPLASTY (Right) Patient reports pain as mild.   Patient seen in rounds by Dr. Wynelle Link. Patient is well, and has had no acute complaints or problems No issues overnight. Denies chest pain, SOB, or calf pain. Voiding without difficulty We will continue therapy today, ambulated 51' yesterday.   Objective: Vital signs in last 24 hours: Temp:  [97.6 F (36.4 C)-98 F (36.7 C)] 97.9 F (36.6 C) (08/08 0634) Pulse Rate:  [49-72] 61 (08/08 0634) Resp:  [6-18] 18 (08/08 0634) BP: (115-177)/(66-93) 126/66 (08/08 0634) SpO2:  [93 %-100 %] 96 % (08/08 0634)  Intake/Output from previous day:  Intake/Output Summary (Last 24 hours) at 03/05/2022 0744 Last data filed at 03/05/2022 0600 Gross per 24 hour  Intake 4021.73 ml  Output 50 ml  Net 3971.73 ml     Intake/Output this shift: No intake/output data recorded.  Labs: Recent Labs    03/05/22 0230  HGB 13.1   Recent Labs    03/05/22 0230  WBC 18.1*  RBC 3.74*  HCT 39.1  PLT 193   Recent Labs    03/05/22 0230  NA 139  K 4.6  CL 100  CO2 27  BUN 19  CREATININE 1.22  GLUCOSE 187*  CALCIUM 8.7*   No results for input(s): "LABPT", "INR" in the last 72 hours.  Exam: General - Patient is Alert and Oriented Extremity - Neurologically intact Neurovascular intact Sensation intact distally Dorsiflexion/Plantar flexion intact Dressing - dressing C/D/I Motor Function - intact, moving foot and toes well on exam.   Past Medical History:  Diagnosis Date   Arthritis    Colon polyp    DVT (deep venous thrombosis) (HCC)    hx of   GERD (gastroesophageal reflux disease)    Hemorrhoid    History of kidney stones    Hyperplasia of prostate    Other and unspecified hyperlipidemia    Other testicular hypofunction    Pneumonia    Pulmonary embolism (HCC)    Sinus bradycardia     Assessment/Plan: 1 Day Post-Op Procedure(s) (LRB): TOTAL KNEE ARTHROPLASTY  (Right) Principal Problem:   OA (osteoarthritis) of knee Active Problems:   Osteoarthritis of right knee  Estimated body mass index is 39.16 kg/m as calculated from the following:   Height as of this encounter: '5\' 7"'$  (1.702 m).   Weight as of this encounter: 113.4 kg. Advance diet Up with therapy D/C IV fluids   Patient's anticipated LOS is less than 2 midnights, meeting these requirements: - Lives within 1 hour of care - Has a competent adult at home to recover with post-op recover - NO history of  - Chronic pain requiring opioids  - Diabetes  - Coronary Artery Disease  - Heart failure  - Heart attack  - Stroke  - Cardiac arrhythmia  - Respiratory Failure/COPD  - Renal failure  - Anemia  - Advanced Liver disease  DVT Prophylaxis -  Eliquis Weight bearing as tolerated. Continue therapy.  Plan is to go Home after hospital stay. Plan for discharge later today if progresses with therapy and meeting goals. Scheduled [facility] OPP[facility] at [facility]). Follow-up in the office in 2 weeks.  The PDMP database was reviewed today prior to any opioid medications being prescribed to this patient.  Theresa Duty, PA-C Orthopedic Surgery [phone] 03/05/2022, 7:44 AM

## 2022-03-08 DIAGNOSIS — M6281 Muscle weakness (generalized): Secondary | ICD-10-CM | POA: Diagnosis not present

## 2022-03-08 DIAGNOSIS — M25561 Pain in right knee: Secondary | ICD-10-CM | POA: Diagnosis not present

## 2022-03-11 DIAGNOSIS — M6281 Muscle weakness (generalized): Secondary | ICD-10-CM | POA: Diagnosis not present

## 2022-03-11 DIAGNOSIS — M25561 Pain in right knee: Secondary | ICD-10-CM | POA: Diagnosis not present

## 2022-03-11 NOTE — Discharge Summary (Signed)
Patient ID: ABSALOM ARO MRN: [id number] DOB/AGE: [birth year] 76 y.o.  Admit date: 03/04/2022 Discharge date: 03/05/2022  Admission Diagnoses:  Principal Problem:   OA (osteoarthritis) of knee Active Problems:   Osteoarthritis of right knee   Discharge Diagnoses:  Same  Past Medical History:  Diagnosis Date   Arthritis    Colon polyp    DVT (deep venous thrombosis) (HCC)    hx of   GERD (gastroesophageal reflux disease)    Hemorrhoid    History of kidney stones    Hyperplasia of prostate    Other and unspecified hyperlipidemia    Other testicular hypofunction    Pneumonia    Pulmonary embolism (HCC)    Sinus bradycardia     Surgeries: Procedure(s): TOTAL KNEE ARTHROPLASTY on 03/04/2022   Consultants:   Discharged Condition: Improved  Hospital Course: BRASEN BUNDREN is an 76 y.o. male who was admitted 03/04/2022 for operative treatment ofOA (osteoarthritis) of knee. Patient has severe unremitting pain that affects sleep, daily activities, and work/hobbies. After pre-op clearance the patient was taken to the operating room on 03/04/2022 and underwent  Procedure(s): TOTAL KNEE ARTHROPLASTY.    Patient was given perioperative antibiotics:  Anti-infectives (From admission, onward)    Start     Dose/Rate Route Frequency Ordered Stop   03/04/22 1530  ceFAZolin (ANCEF) IVPB 2g/100 mL premix        2 g 200 mL/hr over 30 Minutes Intravenous Every 6 hours 03/04/22 1125 03/04/22 2159   03/04/22 0730  ceFAZolin (ANCEF) IVPB 2g/100 mL premix        2 g 200 mL/hr over 30 Minutes Intravenous On call to O.R. 03/04/22 3300 03/04/22 7622        Patient was given sequential compression devices, early ambulation, and chemoprophylaxis to prevent DVT.  Patient benefited maximally from hospital stay and there were no complications.    Recent vital signs: No data found.   Recent laboratory studies: No results for input(s): "WBC", "HGB", "HCT", "PLT", "NA", "K", "CL", "CO2", "BUN",  "CREATININE", "GLUCOSE", "INR", "CALCIUM" in the last 72 hours.  Invalid input(s): "PT", "2"   Discharge Medications:   Allergies as of 03/05/2022       Reactions   Cialis [tadalafil] Other (See Comments)   Not allergic 02/23/21   Viagra [sildenafil Citrate] Other (See Comments)   heartburn        Medication List     TAKE these medications    apixaban 5 MG Tabs tablet Commonly known as: ELIQUIS Take 1 tablet (5 mg total) by mouth 2 (two) times daily.   ascorbic acid 500 MG tablet Commonly known as: VITAMIN C Take 500 mg by mouth daily.   furosemide 20 MG tablet Commonly known as: LASIX Take 1 tablet (20 mg total) by mouth daily.   methocarbamol 500 MG tablet Commonly known as: ROBAXIN Take 1 tablet (500 mg total) by mouth every 6 (six) hours as needed for muscle spasms.   omeprazole 20 MG capsule Commonly known as: PRILOSEC Take 1 capsule (20 mg total) by mouth daily.   oxyCODONE 5 MG immediate release tablet Commonly known as: Oxy IR/ROXICODONE Take 1-2 tablets (5-10 mg total) by mouth every 6 (six) hours as needed for severe pain.   SUPER B COMPLEX PO Take 1 tablet by mouth daily.   tadalafil 5 MG tablet Commonly known as: CIALIS Take 1 tablet (5 mg total) by mouth daily.   traMADol 50 MG tablet Commonly known as: ULTRAM Take 1-2 tablets (  50-100 mg total) by mouth every 6 (six) hours as needed for moderate pain.               Discharge Care Instructions  (From admission, onward)           Start     Ordered   03/05/22 0000  Weight bearing as tolerated        03/05/22 0746   03/05/22 0000  Change dressing       Comments: You may remove the bulky bandage (ACE wrap and gauze) two days after surgery. You will have an adhesive waterproof bandage underneath. Leave this in place until your first follow-up appointment.   03/05/22 0746            Diagnostic Studies: No results found.  Disposition: Discharge disposition: 01-Home or Self  Care       Discharge Instructions     Call MD / Call 911   Complete by: As directed    If you experience chest pain or shortness of breath, CALL 911 and be transported to the hospital emergency room.  If you develope a fever above 101 F, pus (white drainage) or increased drainage or redness at the wound, or calf pain, call your surgeon's office.   Change dressing   Complete by: As directed    You may remove the bulky bandage (ACE wrap and gauze) two days after surgery. You will have an adhesive waterproof bandage underneath. Leave this in place until your first follow-up appointment.   Constipation Prevention   Complete by: As directed    Drink plenty of fluids.  Prune juice may be helpful.  You may use a stool softener, such as Colace (over the counter) 100 mg twice a day.  Use MiraLax (over the counter) for constipation as needed.   Diet - low sodium heart healthy   Complete by: As directed    Do not put a pillow under the knee. Place it under the heel.   Complete by: As directed    Driving restrictions   Complete by: As directed    No driving for two weeks   Post-operative opioid taper instructions:   Complete by: As directed    POST-OPERATIVE OPIOID TAPER INSTRUCTIONS: It is important to wean off of your opioid medication as soon as possible. If you do not need pain medication after your surgery it is ok to stop day one. Opioids include: Codeine, Hydrocodone(Norco, Vicodin), Oxycodone(Percocet, oxycontin) and hydromorphone amongst others.  Long term and even short term use of opiods can cause: Increased pain response Dependence Constipation Depression Respiratory depression And more.  Withdrawal symptoms can include Flu like symptoms Nausea, vomiting And more Techniques to manage these symptoms Hydrate well Eat regular healthy meals Stay active Use relaxation techniques(deep breathing, meditating, yoga) Do Not substitute Alcohol to help with tapering If you have  been on opioids for less than two weeks and do not have pain than it is ok to stop all together.  Plan to wean off of opioids This plan should start within one week post op of your joint replacement. Maintain the same interval or time between taking each dose and first decrease the dose.  Cut the total daily intake of opioids by one tablet each day Next start to increase the time between doses. The last dose that should be eliminated is the evening dose.      TED hose   Complete by: As directed    Use stockings (TED hose) for  three weeks on both leg(s).  You may remove them at night for sleeping.   Weight bearing as tolerated   Complete by: As directed         Follow-up Information     Tychelle Purkey L, PA. Go on 03/21/2022.   Specialty: Orthopedic Surgery Why: You are scheduled for a follow up appointment on 03-21-22 at 8:15 am. Contact information: [address] [place] [address] [phone]                  Signed: Theresa Duty 03/11/2022, 9:01 AM

## 2022-03-13 DIAGNOSIS — M6281 Muscle weakness (generalized): Secondary | ICD-10-CM | POA: Diagnosis not present

## 2022-03-13 DIAGNOSIS — M25561 Pain in right knee: Secondary | ICD-10-CM | POA: Diagnosis not present

## 2022-03-15 DIAGNOSIS — M25561 Pain in right knee: Secondary | ICD-10-CM | POA: Diagnosis not present

## 2022-03-15 DIAGNOSIS — M6281 Muscle weakness (generalized): Secondary | ICD-10-CM | POA: Diagnosis not present

## 2022-03-18 ENCOUNTER — Other Ambulatory Visit: Payer: Self-pay | Admitting: Family Medicine

## 2022-03-18 DIAGNOSIS — R609 Edema, unspecified: Secondary | ICD-10-CM

## 2022-03-18 DIAGNOSIS — M25561 Pain in right knee: Secondary | ICD-10-CM | POA: Diagnosis not present

## 2022-03-18 DIAGNOSIS — M6281 Muscle weakness (generalized): Secondary | ICD-10-CM | POA: Diagnosis not present

## 2022-03-20 DIAGNOSIS — M25561 Pain in right knee: Secondary | ICD-10-CM | POA: Diagnosis not present

## 2022-03-20 DIAGNOSIS — M6281 Muscle weakness (generalized): Secondary | ICD-10-CM | POA: Diagnosis not present

## 2022-03-21 DIAGNOSIS — M6281 Muscle weakness (generalized): Secondary | ICD-10-CM | POA: Diagnosis not present

## 2022-03-21 DIAGNOSIS — M25561 Pain in right knee: Secondary | ICD-10-CM | POA: Diagnosis not present

## 2022-03-26 DIAGNOSIS — M25561 Pain in right knee: Secondary | ICD-10-CM | POA: Diagnosis not present

## 2022-03-26 DIAGNOSIS — M6281 Muscle weakness (generalized): Secondary | ICD-10-CM | POA: Diagnosis not present

## 2022-03-28 DIAGNOSIS — M6281 Muscle weakness (generalized): Secondary | ICD-10-CM | POA: Diagnosis not present

## 2022-03-28 DIAGNOSIS — M25561 Pain in right knee: Secondary | ICD-10-CM | POA: Diagnosis not present

## 2022-04-02 DIAGNOSIS — M25561 Pain in right knee: Secondary | ICD-10-CM | POA: Diagnosis not present

## 2022-04-02 DIAGNOSIS — M6281 Muscle weakness (generalized): Secondary | ICD-10-CM | POA: Diagnosis not present

## 2022-04-04 DIAGNOSIS — M6281 Muscle weakness (generalized): Secondary | ICD-10-CM | POA: Diagnosis not present

## 2022-04-04 DIAGNOSIS — M25561 Pain in right knee: Secondary | ICD-10-CM | POA: Diagnosis not present

## 2022-04-08 ENCOUNTER — Other Ambulatory Visit: Payer: Self-pay | Admitting: Family Medicine

## 2022-04-08 DIAGNOSIS — R609 Edema, unspecified: Secondary | ICD-10-CM

## 2022-04-08 DIAGNOSIS — M25561 Pain in right knee: Secondary | ICD-10-CM | POA: Diagnosis not present

## 2022-04-08 DIAGNOSIS — M6281 Muscle weakness (generalized): Secondary | ICD-10-CM | POA: Diagnosis not present

## 2022-04-11 DIAGNOSIS — M25561 Pain in right knee: Secondary | ICD-10-CM | POA: Diagnosis not present

## 2022-04-11 DIAGNOSIS — M6281 Muscle weakness (generalized): Secondary | ICD-10-CM | POA: Diagnosis not present

## 2022-04-11 DIAGNOSIS — Z4789 Encounter for other orthopedic aftercare: Secondary | ICD-10-CM | POA: Diagnosis not present

## 2022-04-14 ENCOUNTER — Other Ambulatory Visit: Payer: Self-pay | Admitting: Family Medicine

## 2022-04-14 DIAGNOSIS — K219 Gastro-esophageal reflux disease without esophagitis: Secondary | ICD-10-CM

## 2022-04-15 DIAGNOSIS — M6281 Muscle weakness (generalized): Secondary | ICD-10-CM | POA: Diagnosis not present

## 2022-04-15 DIAGNOSIS — M25561 Pain in right knee: Secondary | ICD-10-CM | POA: Diagnosis not present

## 2022-04-18 DIAGNOSIS — M6281 Muscle weakness (generalized): Secondary | ICD-10-CM | POA: Diagnosis not present

## 2022-04-18 DIAGNOSIS — M25561 Pain in right knee: Secondary | ICD-10-CM | POA: Diagnosis not present

## 2022-04-20 ENCOUNTER — Other Ambulatory Visit: Payer: Self-pay | Admitting: Pulmonary Disease

## 2022-04-20 DIAGNOSIS — I82402 Acute embolism and thrombosis of unspecified deep veins of left lower extremity: Secondary | ICD-10-CM

## 2022-04-20 DIAGNOSIS — I2609 Other pulmonary embolism with acute cor pulmonale: Secondary | ICD-10-CM

## 2022-04-20 DIAGNOSIS — I2699 Other pulmonary embolism without acute cor pulmonale: Secondary | ICD-10-CM

## 2022-04-20 DIAGNOSIS — I82401 Acute embolism and thrombosis of unspecified deep veins of right lower extremity: Secondary | ICD-10-CM

## 2022-04-26 ENCOUNTER — Ambulatory Visit: Payer: Medicare HMO | Admitting: Nurse Practitioner

## 2022-04-26 ENCOUNTER — Ambulatory Visit (INDEPENDENT_AMBULATORY_CARE_PROVIDER_SITE_OTHER): Payer: Medicare HMO

## 2022-04-26 ENCOUNTER — Encounter: Payer: Self-pay | Admitting: Nurse Practitioner

## 2022-04-26 VITALS — BP 128/70 | HR 78 | Ht 66.0 in | Wt 253.4 lb

## 2022-04-26 DIAGNOSIS — I2699 Other pulmonary embolism without acute cor pulmonale: Secondary | ICD-10-CM

## 2022-04-26 DIAGNOSIS — J9801 Acute bronchospasm: Secondary | ICD-10-CM

## 2022-04-26 DIAGNOSIS — R0609 Other forms of dyspnea: Secondary | ICD-10-CM | POA: Diagnosis not present

## 2022-04-26 DIAGNOSIS — R0602 Shortness of breath: Secondary | ICD-10-CM

## 2022-04-26 MED ORDER — PREDNISONE 20 MG PO TABS: 40.0000 mg | ORAL_TABLET | Freq: Every day | ORAL | 0 refills | Status: AC

## 2022-04-26 MED ORDER — ALBUTEROL SULFATE HFA 108 (90 BASE) MCG/ACT IN AERS: 2.0000 | INHALATION_SPRAY | Freq: Four times a day (QID) | RESPIRATORY_TRACT | 2 refills | Status: DC | PRN

## 2022-04-26 NOTE — Patient Instructions (Addendum)
Continue Eliquis 5 mg Twice daily. Monitor for any excessive bleeding/bruising.  Continue lasix 20 mg daily  Prednisone 40 mg daily for 5 days. Take in AM with food Albuterol 2 puffs Twice daily as needed for shortness of breath or wheezing  Monitor oxygen levels; if you are 88% or below again, you need to go to the ED   Labs today   Chest x ray today  Pulmonary function testing ordered today  Follow up in 2 weeks with Dr. Loanne Drilling or [place]. If symptoms do not improve or worsen, please contact office for sooner follow up or seek emergency care.

## 2022-04-26 NOTE — Assessment & Plan Note (Addendum)
Acute onset of dyspnea upon exertion x2 to 3 weeks.  He was noted to have a hypoxic event at home 2 days ago.  This has resolved since.  He was noted to be 96% on room air today.  He admits to compliance with his Eliquis and not having missed any doses.  Unclear exact etiology.  He does have some bilateral lower extremity swelling and a very mild wheeze on exam today.  He was also a former smoker.  No formal diagnosis of COPD but could have an underlying obstructive defect. CHF vs PAH vs AECOPD vs infectious process. Could also be anemia r/t chronic anticoagulation or possible underlying thyroid etiology with associated fatigue. We will treat him with a prednisone burst and provide him with an albuterol inhaler to use as needed.  Close follow-up to see if he has any response to this. Chest x-ray to assess for any superimposed infection or pulmonary edema.  Check BNP, CBC with differential, TSH and BMET today.  PFTs ordered for further evaluation.  Strict ED precautions and close follow-up.  Patient Instructions  Continue Eliquis 5 mg Twice daily. Monitor for any excessive bleeding/bruising.  Continue lasix 20 mg daily  Prednisone 40 mg daily for 5 days. Take in AM with food Albuterol 2 puffs Twice daily as needed for shortness of breath or wheezing  Monitor oxygen levels; if you are 88% or below again, you need to go to the ED   Labs today   Chest x ray today  Pulmonary function testing ordered today  Follow up in 2 weeks with Dr. Loanne Drilling or [place]. If symptoms do not improve or worsen, please contact office for sooner follow up or seek emergency care.

## 2022-04-26 NOTE — Assessment & Plan Note (Signed)
See above.  Low suspicion for recurrence of VTE given his compliance with Eliquis.  Initially concern for some possible underlying right heart strain or pulmonary hypertension from his previous PEs; however, his echocardiogram from 04/2021 was unremarkable.  He was provided with strict ED precautions.

## 2022-04-26 NOTE — Progress Notes (Signed)
$'@Patient'Y$  ID: [name] [name], male    DOB: 06/12/1946, 76 y.o.   MRN: [id number]  Chief Complaint  Patient presents with   Follow-up    Referring provider: Dettinger, Fransisca Kaufmann, MD  HPI: 76 year old male, former smoker followed for recurrent pulmonary emboli. He is a patient of Dr. Cordelia Pen and last seen in [facility] 11/29/2021. Past medical history significant for HTN, GERD, osteoarthritis, BPH, HLD, obesity.   He was previously seen for pulmonary embolism and bilateral DVTs presumed to be provoked by COVID-19 in October 2021.  He completed 6 months of anticoagulation in April 2022.  Unfortunately, he was readmitted in July 2022 for acute submassive PE that was unprovoked.  He was treated with heparin and transition to Eliquis, which she has been maintained on since.  TEST/EVENTS:  08/16/2020 CTA chest: No evidence of acute pulmonary embolism.  There is a tiny peripheral filling defect in the segmental branch of the left lower lobar pulmonary artery, consistent with chronic PE.  Right-sided cardiac enlargement.  Decreased when compared to prior.  Bibasilar atelectasis.  No acute lung process. 02/23/2021 CTA chest: Large bilateral pulmonary emboli are noted which extended to the upper and lower lobe branches.  There is evidence of right heart strain.  Minimal bibasilar atelectasis.  05/07/2021 echocardiogram: EF 6065%.  Mild LVH.  Normal diastolic parameters.  RV size and function is normal.  Trivial MR.  11/29/2021: OV with Dr. Loanne Drilling.  He presents for preop evaluation prior to right knee surgery.  No current complaints except for lower extremity swelling that worsens with sitting or standing too long and improves with elevation.  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  Walks back and forth in the house but remains mainly sedentary due to right knee pain.  Preop clearance provided.  Strict parameters on time off anticoagulation.  Follow-up 6 months.  04/26/2022:  Today-acute Patient presents today for acute visit.  He feels like over the last 2 to 3 weeks he has had increased shortness of breath and his activity tolerance is not as good as what it normally is.  Usually he can walk back and forth to his mailbox a couple times for exercise.  States that after he walks down to [employer], he has to stop to rest before he can go back.  He does note that he had some chest congestion and URI symptoms that started around the same time.  These have resolved.  He does note that 2 days ago, he had a drop in his oxygen at home.  He was noted to be 82%.  He thought he might should go to the hospital but decided not to.  His oxygen came up to the 90s by the next morning and has maintained here since.  He has some mild fatigue symptoms. He denies any significant cough, wheezing, fevers, night sweats, orthopnea, hemoptysis, palpitations.  He does have some leg swelling, which is actually better than it usually is for him.  He takes Lasix 20 mg daily.  Weight stable. He has never been on any inhalers.  He does have a former smoking history.  Never had formal PFTs.  He reports compliance with Eliquis and has not missed a dose.  Denies any excessive bleeding or bruising. He had his surgery on 03/04/2022.  No significant postop complications.  He restarted his Eliquis the day after surgery from what he can remember.  Felt well up until the last few weeks.  Allergies  Allergen Reactions   Cialis [Tadalafil] Other (See Comments)    Not allergic 02/23/21   Viagra [Sildenafil Citrate] Other (See Comments)    heartburn    Immunization History  Administered Date(s) Administered   Pneumococcal Conjugate-13 05/03/2013   Pneumococcal Polysaccharide-23 07/29/2009   Td 07/30/2003    Past Medical History:  Diagnosis Date   Arthritis    Colon polyp    DVT (deep venous thrombosis) (HCC)    hx of   GERD (gastroesophageal reflux disease)    Hemorrhoid    History of kidney stones     Hyperplasia of prostate    Other and unspecified hyperlipidemia    Other testicular hypofunction    Pneumonia    Pulmonary embolism (HCC)    Sinus bradycardia     Tobacco History: Social History   Tobacco Use  Smoking Status Former   Types: Cigars   Quit date: 2015   Years since quitting: 8.7  Smokeless Tobacco Current   Types: Chew  Tobacco Comments   only 1 cigar on occassion   Ready to quit: Not Answered Counseling given: Not Answered Tobacco comments: only 1 cigar on occassion   Outpatient Medications Prior to Visit  Medication Sig Dispense Refill   B Complex-C (SUPER B COMPLEX PO) Take 1 tablet by mouth daily.     ELIQUIS 5 MG TABS tablet TAKE 1 TABLET BY MOUTH TWICE A DAY 180 tablet 3   furosemide (LASIX) 20 MG tablet TAKE 1 TABLET BY MOUTH EVERY DAY 30 tablet 4   methocarbamol (ROBAXIN) 500 MG tablet Take 1 tablet (500 mg total) by mouth every 6 (six) hours as needed for muscle spasms. 40 tablet 0   omeprazole (PRILOSEC) 20 MG capsule Take 1 capsule (20 mg total) by mouth daily. 90 capsule 1   tadalafil (CIALIS) 5 MG tablet Take 1 tablet (5 mg total) by mouth daily. 30 tablet 3   vitamin C (ASCORBIC ACID) 500 MG tablet Take 500 mg by mouth daily.     oxyCODONE (OXY IR/ROXICODONE) 5 MG immediate release tablet Take 1-2 tablets (5-10 mg total) by mouth every 6 (six) hours as needed for severe pain. (Patient not taking: Reported on 04/26/2022) 42 tablet 0   traMADol (ULTRAM) 50 MG tablet Take 1-2 tablets (50-100 mg total) by mouth every 6 (six) hours as needed for moderate pain. (Patient not taking: Reported on 04/26/2022) 40 tablet 0   No facility-administered medications prior to visit.     Review of Systems:   Constitutional: No weight loss or gain, night sweats, fevers, chills, or lassitude. +Fatigue  HEENT: No headaches, difficulty swallowing, tooth/dental problems, or sore throat. No sneezing, itching, ear ache, nasal congestion, or post nasal drip CV:  +swelling  in lower extremities. No chest pain, orthopnea, PND, anasarca, dizziness, palpitations, syncope Resp: +shortness of breath with exertion. No excess mucus or change in color of mucus. No productive or non-productive. No hemoptysis. No wheezing.  No chest wall deformity GI:  No heartburn, indigestion, abdominal pain, nausea, vomiting, diarrhea, change in bowel habits, loss of appetite, bloody stools.  GU: No dysuria, change in color of urine, urgency or frequency.  No flank pain, no hematuria  Skin: No rash, lesions, ulcerations MSK:  No joint pain or swelling.  No decreased range of motion.  No back pain. Neuro: No dizziness or lightheadedness.  Psych: No depression or anxiety. Mood stable.     Physical Exam:  BP 128/70 (BP Location: Left Arm, Patient Position: Sitting,  Cuff Size: Normal)   Pulse 78   Ht '5\' 6"'$  (1.676 m)   Wt 253 lb 6.4 oz (114.9 kg)   SpO2 96%   BMI 40.90 kg/m   GEN: Pleasant, interactive, well-developed; obese; in no acute distress. HEENT:  Normocephalic and atraumatic. PERRLA. Sclera white. Nasal turbinates pink, moist and patent bilaterally. No rhinorrhea present. Oropharynx pink and moist, without exudate or edema. No lesions, ulcerations, or postnasal drip.  NECK:  Supple w/ fair ROM. No JVD present. Normal carotid impulses w/o bruits. Thyroid symmetrical with no goiter or nodules palpated. No lymphadenopathy.   CV: RRR, no m/r/g, +1 BLE edema. Pulses intact, +2 bilaterally. No cyanosis, pallor or clubbing. PULMONARY:  Unlabored, regular breathing. Minimal end expiratory wheeze LLL otherwise clear bilaterally A&P w/o wheezes/rales/rhonchi. No accessory muscle use.  GI: BS present and normoactive. Soft, non-tender to palpation. No organomegaly or masses detected.  MSK: No erythema, warmth or tenderness. Cap refil <2 sec all extrem. No deformities or joint swelling noted.  Neuro: A/Ox3. No focal deficits noted.   Skin: Warm, no lesions or rashe Psych: Normal affect  and behavior. Judgement and thought content appropriate.     Lab Results:  CBC    Component Value Date/Time   WBC 18.1 (H) 03/05/2022 0230   RBC 3.74 (L) 03/05/2022 0230   HGB 13.1 03/05/2022 0230   HGB 14.6 12/03/2021 1501   HCT 39.1 03/05/2022 0230   HCT 41.3 12/03/2021 1501   PLT 193 03/05/2022 0230   PLT 174 12/03/2021 1501   MCV 104.5 (H) 03/05/2022 0230   MCV 98 (H) 12/03/2021 1501   MCH 35.0 (H) 03/05/2022 0230   MCHC 33.5 03/05/2022 0230   RDW 13.0 03/05/2022 0230   RDW 12.7 12/03/2021 1501   LYMPHSABS 2.5 12/03/2021 1501   MONOABS 0.4 06/22/2021 1419   EOSABS 0.3 12/03/2021 1501   BASOSABS 0.0 12/03/2021 1501    BMET    Component Value Date/Time   NA 139 03/05/2022 0230   NA 141 12/03/2021 1501   K 4.6 03/05/2022 0230   CL 100 03/05/2022 0230   CO2 27 03/05/2022 0230   GLUCOSE 187 (H) 03/05/2022 0230   BUN 19 03/05/2022 0230   BUN 11 12/03/2021 1501   CREATININE 1.22 03/05/2022 0230   CREATININE 0.89 12/14/2012 0903   CALCIUM 8.7 (L) 03/05/2022 0230   GFRNONAA >60 03/05/2022 0230   GFRNONAA 88 12/14/2012 0903   GFRAA 98 06/08/2020 1046   GFRAA >89 12/14/2012 0903    BNP    Component Value Date/Time   BNP 53.0 06/22/2021 1419     Imaging:  DG Chest 2 View  Result Date: 04/26/2022 CLINICAL DATA:  Shortness of breath. EXAM: CHEST - 2 VIEW COMPARISON:  06/22/2021 FINDINGS: The heart size and mediastinal contours are within normal limits. There is no evidence of pulmonary edema, consolidation, pneumothorax, nodule or pleural fluid. The visualized skeletal structures are unremarkable. IMPRESSION: No active cardiopulmonary disease. Electronically Signed   By: Aletta Edouard M.D.   On: 04/26/2022 16:58          No data to display          No results found for: "NITRICOXIDE"      Assessment & Plan:   Dyspnea on exertion Acute onset of dyspnea upon exertion x2 to 3 weeks.  He was noted to have a hypoxic event at home 2 days ago.  This  has resolved since.  He was noted to be 96% on room air  today.  He admits to compliance with his Eliquis and not having missed any doses.  Unclear exact etiology.  He does have some bilateral lower extremity swelling and a very mild wheeze on exam today.  He was also a former smoker.  No formal diagnosis of COPD but could have an underlying obstructive defect. CHF vs PAH vs AECOPD vs infectious process. Could also be anemia r/t chronic anticoagulation or possible underlying thyroid etiology with associated fatigue. We will treat him with a prednisone burst and provide him with an albuterol inhaler to use as needed.  Close follow-up to see if he has any response to this. Chest x-ray to assess for any superimposed infection or pulmonary edema.  Check BNP, CBC with differential, TSH and BMET today.  PFTs ordered for further evaluation.  Strict ED precautions and close follow-up.  Patient Instructions  Continue Eliquis 5 mg Twice daily. Monitor for any excessive bleeding/bruising.  Continue lasix 20 mg daily  Prednisone 40 mg daily for 5 days. Take in AM with food Albuterol 2 puffs Twice daily as needed for shortness of breath or wheezing  Monitor oxygen levels; if you are 88% or below again, you need to go to the ED   Labs today   Chest x ray today  Pulmonary function testing ordered today  Follow up in 2 weeks with Dr. Loanne Drilling or [place]. If symptoms do not improve or worsen, please contact office for sooner follow up or seek emergency care.     Recurrent pulmonary emboli (HCC) See above.  Low suspicion for recurrence of VTE given his compliance with Eliquis.  Initially concern for some possible underlying right heart strain or pulmonary hypertension from his previous PEs; however, his echocardiogram from 04/2021 was unremarkable.  He was provided with strict ED precautions.   I spent 41 minutes of dedicated to the care of this patient on the date of this encounter to include pre-visit  review of records, face-to-face time with the patient discussing conditions above, post visit ordering of testing, clinical documentation with the electronic health record, making appropriate referrals as documented, and communicating necessary findings to members of the patients care team.  Clayton Bibles, NP 04/26/2022  Pt aware and understands NP's role.

## 2022-04-27 LAB — BASIC METABOLIC PANEL
BUN: 9 mg/dL (ref 7–25)
CO2: 28 mmol/L (ref 20–32)
Calcium: 9.4 mg/dL (ref 8.6–10.3)
Chloride: 99 mmol/L (ref 98–110)
Creat: 1.01 mg/dL (ref 0.70–1.28)
Glucose, Bld: 102 mg/dL — ABNORMAL HIGH (ref 65–99)
Potassium: 4 mmol/L (ref 3.5–5.3)
Sodium: 139 mmol/L (ref 135–146)

## 2022-04-27 LAB — CBC WITH DIFFERENTIAL/PLATELET
Absolute Monocytes: 660 cells/uL (ref 200–950)
Basophils Absolute: 79 cells/uL (ref 0–200)
Basophils Relative: 0.9 %
Eosinophils Absolute: 361 cells/uL (ref 15–500)
Eosinophils Relative: 4.1 %
HCT: 40.8 % (ref 38.5–50.0)
Hemoglobin: 14.3 g/dL (ref 13.2–17.1)
Lymphs Abs: 3854 cells/uL (ref 850–3900)
MCH: 33.5 pg — ABNORMAL HIGH (ref 27.0–33.0)
MCHC: 35 g/dL (ref 32.0–36.0)
MCV: 95.6 fL (ref 80.0–100.0)
MPV: 10.1 fL (ref 7.5–12.5)
Monocytes Relative: 7.5 %
Neutro Abs: 3846 cells/uL (ref 1500–7800)
Neutrophils Relative %: 43.7 %
Platelets: 226 10*3/uL (ref 140–400)
RBC: 4.27 10*6/uL (ref 4.20–5.80)
RDW: 12.3 % (ref 11.0–15.0)
Total Lymphocyte: 43.8 %
WBC: 8.8 10*3/uL (ref 3.8–10.8)

## 2022-04-27 LAB — BRAIN NATRIURETIC PEPTIDE: Brain Natriuretic Peptide: 33 pg/mL (ref <100)

## 2022-04-27 LAB — TSH: TSH: 3.06 mIU/L (ref 0.40–4.50)

## 2022-05-01 NOTE — Progress Notes (Signed)
Please notify patient that his labs were normal. Thanks.

## 2022-05-01 NOTE — Progress Notes (Signed)
Called and spoke with patient, advised of results per Marland Kitchen NP.  He verbalized understanding.  Nothing further needed.

## 2022-05-10 ENCOUNTER — Ambulatory Visit: Payer: Medicare HMO | Admitting: Nurse Practitioner

## 2022-05-10 ENCOUNTER — Encounter: Payer: Self-pay | Admitting: Nurse Practitioner

## 2022-05-10 DIAGNOSIS — R0609 Other forms of dyspnea: Secondary | ICD-10-CM | POA: Diagnosis not present

## 2022-05-10 DIAGNOSIS — I2699 Other pulmonary embolism without acute cor pulmonale: Secondary | ICD-10-CM | POA: Diagnosis not present

## 2022-05-10 NOTE — Patient Instructions (Addendum)
Continue Eliquis 5 mg Twice daily. Monitor for any excessive bleeding/bruising.  Continue lasix 20 mg daily Continue Albuterol 2 puffs Twice daily as needed for shortness of breath or wheezing    Follow up in 6 months with Dr. Loanne Drilling. If symptoms do not improve or worsen, please contact office for sooner follow up or seek emergency care.

## 2022-05-10 NOTE — Progress Notes (Signed)
$'@Patient'n$  ID: [name] [name], male    DOB: [birth year], 76 y.o.   MRN: [id number]  Chief Complaint  Patient presents with   Follow-up    Referring provider: Dettinger, Fransisca Kaufmann, MD  HPI: 76 year old male, former smoker followed for recurrent pulmonary emboli. He is a patient of Dr. Cordelia Pen and last seen in officce 04/26/2022 by [facility] NP. Past medical history significant for HTN, GERD, osteoarthritis, BPH, HLD, obesity.   He was previously seen for pulmonary embolism and bilateral DVTs presumed to be provoked by COVID-19 in October 2021.  He completed 6 months of anticoagulation in April 2022.  Unfortunately, he was readmitted in July 2022 for acute submassive PE that was unprovoked.  He was treated with heparin and transition to Eliquis, which she has been maintained on since.  TEST/EVENTS:  08/16/2020 CTA chest: No evidence of acute pulmonary embolism.  There is a tiny peripheral filling defect in the segmental branch of the left lower lobar pulmonary artery, consistent with chronic PE.  Right-sided cardiac enlargement.  Decreased when compared to prior.  Bibasilar atelectasis.  No acute lung process. 02/23/2021 CTA chest: Large bilateral pulmonary emboli are noted which extended to the upper and lower lobe branches.  There is evidence of right heart strain.  Minimal bibasilar atelectasis.  05/07/2021 echocardiogram: EF 6065%.  Mild LVH.  Normal diastolic parameters.  RV size and function is normal.  Trivial MR. 04/26/2022 CXR: lungs clear   11/29/2021: OV with Dr. Loanne Drilling.  He presents for preop evaluation prior to right knee surgery.  No current complaints except for lower extremity swelling that worsens with sitting or standing too long and improves with elevation.  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  Walks back and forth in the house but remains mainly sedentary due to right knee pain.  Preop clearance provided.  Strict parameters on time off  anticoagulation.  Follow-up 6 months.  04/26/2022: [place] wit Arlan Birks NP for acute visit.  He feels like over the last 2 to 3 weeks he has had increased shortness of breath and his activity tolerance is not as good as what it normally is.  Usually he can walk back and forth to his mailbox a couple times for exercise.  States that after he walks down to [employer], he has to stop to rest before he can go back.  He does note that he had some chest congestion and URI symptoms that started around the same time.  These have resolved.  2 days ago, he had a drop in his oxygen at home.  He was noted to be 82%.  He thought he might should go to the hospital but decided not to.  His oxygen came up to the 90s by the next morning and has maintained here since.  He has some mild fatigue symptoms. He had his surgery on 03/04/2022.  No significant postop complications.  He restarted his Eliquis the day after surgery from what he can remember.  Felt well up until the last few weeks. Acute onset of dyspnea upon exertion x2 to 3 weeks.  He was noted to have a hypoxic event at home 2 days ago.  This has resolved since.  He was noted to be 96% on room air today.  He admits to compliance with his Eliquis and not having missed any doses.  Unclear exact etiology.  He does have some bilateral lower extremity swelling and a very mild wheeze on exam  today.  He was also a former smoker.  No formal diagnosis of COPD but could have an underlying obstructive defect. CHF vs PAH vs AECOPD vs infectious process. Could also be anemia r/t chronic anticoagulation or possible underlying thyroid etiology with associated fatigue. We will treat him with a prednisone burst and provide him with an albuterol inhaler to use as needed.  Close follow-up to see if he has any response to this. Chest x-ray negative for acute process.  BNP, CBC with differential, TSH and BMET all nl.  PFTs ordered for further evaluation.  Strict ED precautions and close  follow-up.  05/10/2022: Today - follow up Patient presents today for follow-up after being treated for increased dyspnea with prednisone taper and albuterol inhaler.  He tells me that he feels better today.  Feels like he is back to his baseline.  He has used the albuterol 2-3 times.  Feels like it does help him some.  Denies any significant chest congestion, cough, wheezing, increased lower extremity swelling, orthopnea.  He feels like his activity tolerance depends on the day.  He can go grocery shopping and walk 100 yards without stopping as long as he is at a slower pace.  At this point, he does not want to try any other medications and he would like to cancel his pulmonary function testing that was scheduled for November. No concerns or complaints today.  Allergies  Allergen Reactions   Cialis [Tadalafil] Other (See Comments)    Not allergic 02/23/21   Viagra [Sildenafil Citrate] Other (See Comments)    heartburn    Immunization History  Administered Date(s) Administered   Pneumococcal Conjugate-13 05/03/2013   Pneumococcal Polysaccharide-23 07/29/2009   Td 07/30/2003    Past Medical History:  Diagnosis Date   Arthritis    Colon polyp    DVT (deep venous thrombosis) (HCC)    hx of   GERD (gastroesophageal reflux disease)    Hemorrhoid    History of kidney stones    Hyperplasia of prostate    Other and unspecified hyperlipidemia    Other testicular hypofunction    Pneumonia    Pulmonary embolism (HCC)    Sinus bradycardia     Tobacco History: Social History   Tobacco Use  Smoking Status Former   Types: Cigars   Quit date: 2015   Years since quitting: 8.7  Smokeless Tobacco Current   Types: Chew  Tobacco Comments   only 1 cigar on occassion   Ready to quit: Not Answered Counseling given: Not Answered Tobacco comments: only 1 cigar on occassion   Outpatient Medications Prior to Visit  Medication Sig Dispense Refill   albuterol (VENTOLIN HFA) 108 (90 Base)  MCG/ACT inhaler Inhale 2 puffs into the lungs every 6 (six) hours as needed. 8 g 2   B Complex-C (SUPER B COMPLEX PO) Take 1 tablet by mouth daily.     ELIQUIS 5 MG TABS tablet TAKE 1 TABLET BY MOUTH TWICE A DAY 180 tablet 3   furosemide (LASIX) 20 MG tablet TAKE 1 TABLET BY MOUTH EVERY DAY 30 tablet 4   omeprazole (PRILOSEC) 20 MG capsule Take 1 capsule (20 mg total) by mouth daily. 90 capsule 1   tadalafil (CIALIS) 5 MG tablet Take 1 tablet (5 mg total) by mouth daily. 30 tablet 3   vitamin C (ASCORBIC ACID) 500 MG tablet Take 500 mg by mouth daily.     methocarbamol (ROBAXIN) 500 MG tablet Take 1 tablet (500 mg total) by mouth  every 6 (six) hours as needed for muscle spasms. (Patient not taking: Reported on 05/10/2022) 40 tablet 0   oxyCODONE (OXY IR/ROXICODONE) 5 MG immediate release tablet Take 1-2 tablets (5-10 mg total) by mouth every 6 (six) hours as needed for severe pain. (Patient not taking: Reported on 05/10/2022) 42 tablet 0   traMADol (ULTRAM) 50 MG tablet Take 1-2 tablets (50-100 mg total) by mouth every 6 (six) hours as needed for moderate pain. (Patient not taking: Reported on 05/10/2022) 40 tablet 0   No facility-administered medications prior to visit.     Review of Systems:   Constitutional: No weight loss or gain, night sweats, fevers, chills, or lassitude. +Fatigue (improved) HEENT: No headaches, difficulty swallowing, tooth/dental problems, or sore throat. No sneezing, itching, ear ache, nasal congestion, or post nasal drip CV:  +swelling in lower extremities (stable). No chest pain, orthopnea, PND, anasarca, dizziness, palpitations, syncope Resp: +shortness of breath with exertion (improved). No excess mucus or change in color of mucus. No productive or non-productive. No hemoptysis. No wheezing.  No chest wall deformity GI:  No heartburn, indigestion, abdominal pain, nausea, vomiting, diarrhea, change in bowel habits, loss of appetite, bloody stools.  Neuro: No  dizziness or lightheadedness.  Psych: No depression or anxiety. Mood stable.     Physical Exam:  BP 126/72 (BP Location: Left Arm, Cuff Size: Normal)   Pulse 72   Ht '5\' 6"'$  (1.676 m)   Wt 256 lb (116.1 kg)   SpO2 97%   BMI 41.32 kg/m   GEN: Pleasant, interactive, well-developed; obese; in no acute distress. HEENT:  Normocephalic and atraumatic. PERRLA. Sclera white. Nasal turbinates pink, moist and patent bilaterally. No rhinorrhea present. Oropharynx pink and moist, without exudate or edema. No lesions, ulcerations, or postnasal drip.  NECK:  Supple w/ fair ROM. No JVD present. Normal carotid impulses w/o bruits. Thyroid symmetrical with no goiter or nodules palpated. No lymphadenopathy.   CV: RRR, no m/r/g, +1 BLE edema. Pulses intact, +2 bilaterally. No cyanosis, pallor or clubbing. PULMONARY:  Unlabored, regular breathing. Clear bilaterally A&P w/o wheezes/rales/rhonchi. No accessory muscle use.  GI: BS present and normoactive. Soft, non-tender to palpation. No organomegaly or masses detected.  MSK: No erythema, warmth or tenderness. Cap refil <2 sec all extrem. No deformities or joint swelling noted.  Neuro: A/Ox3. No focal deficits noted.   Skin: Warm, no lesions or rashe Psych: Normal affect and behavior. Judgement and thought content appropriate.     Lab Results:  CBC    Component Value Date/Time   WBC 8.8 04/26/2022 1629   RBC 4.27 04/26/2022 1629   HGB 14.3 04/26/2022 1629   HGB 14.6 12/03/2021 1501   HCT 40.8 04/26/2022 1629   HCT 41.3 12/03/2021 1501   PLT 226 04/26/2022 1629   PLT 174 12/03/2021 1501   MCV 95.6 04/26/2022 1629   MCV 98 (H) 12/03/2021 1501   MCH 33.5 (H) 04/26/2022 1629   MCHC 35.0 04/26/2022 1629   RDW 12.3 04/26/2022 1629   RDW 12.7 12/03/2021 1501   LYMPHSABS 3,854 04/26/2022 1629   LYMPHSABS 2.5 12/03/2021 1501   MONOABS 0.4 06/22/2021 1419   EOSABS 361 04/26/2022 1629   EOSABS 0.3 12/03/2021 1501   BASOSABS 79 04/26/2022 1629    BASOSABS 0.0 12/03/2021 1501    BMET    Component Value Date/Time   NA 139 04/26/2022 1629   NA 141 12/03/2021 1501   K 4.0 04/26/2022 1629   CL 99 04/26/2022 1629   CO2  28 04/26/2022 1629   GLUCOSE 102 (H) 04/26/2022 1629   BUN 9 04/26/2022 1629   BUN 11 12/03/2021 1501   CREATININE 1.01 04/26/2022 1629   CALCIUM 9.4 04/26/2022 1629   GFRNONAA >60 03/05/2022 0230   GFRNONAA 88 12/14/2012 0903   GFRAA 98 06/08/2020 1046   GFRAA >89 12/14/2012 0903    BNP    Component Value Date/Time   BNP 33 04/26/2022 1629     Imaging:  DG Chest 2 View  Result Date: 04/26/2022 CLINICAL DATA:  Shortness of breath. EXAM: CHEST - 2 VIEW COMPARISON:  06/22/2021 FINDINGS: The heart size and mediastinal contours are within normal limits. There is no evidence of pulmonary edema, consolidation, pneumothorax, nodule or pleural fluid. The visualized skeletal structures are unremarkable. IMPRESSION: No active cardiopulmonary disease. Electronically Signed   By: Aletta Edouard M.D.   On: 04/26/2022 16:58          No data to display          No results found for: "NITRICOXIDE"      Assessment & Plan:   Dyspnea on exertion Seen 9/29 for increased dyspnea and fatigue; he had URI symptoms 2 weeks prior. Improved with prednisone course. Suspect this was post-infectious/viral in nature. He did receive good benefit from albuterol use; possible underlying obstructive lung disease given his smoking history. Disease progression reviewed. He feels back to baseline today and wants to cancel his future PFTs. He also does not want to try any more medications. Advised him to notify us if he changes his mind.  Patient Instructions  Continue Eliquis 5 mg Twice daily. Monitor for any excessive bleeding/bruising.  Continue lasix 20 mg daily Continue Albuterol 2 puffs Twice daily as needed for shortness of breath or wheezing    Follow up in 6 months with Dr. Loanne Drilling. If symptoms do not improve or  worsen, please contact office for sooner follow up or seek emergency care.     Recurrent pulmonary emboli (HCC) Compliant with anticoagulation. No excessive bruising/bleeding. Continue Eliquis Twice daily. Aware of ED precautions.     I spent 28 minutes of dedicated to the care of this patient on the date of this encounter to include pre-visit review of records, face-to-face time with the patient discussing conditions above, post visit ordering of testing, clinical documentation with the electronic health record, making appropriate referrals as documented, and communicating necessary findings to members of the patients care team.  Clayton Bibles, NP 05/10/2022  Pt aware and understands NP's role.

## 2022-05-10 NOTE — Assessment & Plan Note (Signed)
Compliant with anticoagulation. No excessive bruising/bleeding. Continue Eliquis Twice daily. Aware of ED precautions.

## 2022-05-10 NOTE — Assessment & Plan Note (Signed)
Seen 9/29 for increased dyspnea and fatigue; he had URI symptoms 2 weeks prior. Improved with prednisone course. Suspect this was post-infectious/viral in nature. He did receive good benefit from albuterol use; possible underlying obstructive lung disease given his smoking history. Disease progression reviewed. He feels back to baseline today and wants to cancel his future PFTs. He also does not want to try any more medications. Advised him to notify us if he changes his mind.  Patient Instructions  Continue Eliquis 5 mg Twice daily. Monitor for any excessive bleeding/bruising.  Continue lasix 20 mg daily Continue Albuterol 2 puffs Twice daily as needed for shortness of breath or wheezing    Follow up in 6 months with Dr. Loanne Drilling. If symptoms do not improve or worsen, please contact office for sooner follow up or seek emergency care.

## 2022-06-24 ENCOUNTER — Other Ambulatory Visit (HOSPITAL_COMMUNITY): Payer: Self-pay

## 2022-06-24 ENCOUNTER — Telehealth: Payer: Self-pay | Admitting: Pulmonary Disease

## 2022-06-24 NOTE — Telephone Encounter (Signed)
Per benefits investigation there would not be a cheaper alternative due to the patients donut hole. The patients Eliquis is $450.08 per test claim for a 90 day supply. The only alternative of Xarelto is a similar cost of $435.40 for a 90 day supply.

## 2022-06-24 NOTE — Telephone Encounter (Signed)
Called and spoke with pt's daughter letting her know the info per pharmacy team. She wanted to argue with me stating that she was told that a 30-day supply would be over $500 and I told her again that our pharmacy team did a benefits investigation and said that a 90-day supply was going to be around $450. After stating that to her again, she hung up on me.

## 2022-06-24 NOTE — Telephone Encounter (Signed)
Called and spoke with pt's daughter who states that pt is currently in the donut hole and due to this, pt's Eliquis is over $500. They want to know if there is something else that could be prescribed just for December 2023 to hold patient over. Patient only has 5 days left of Eliquis.   Routing this to both Dr. Loanne Drilling and prior Josem Kaufmann team for review.

## 2022-06-25 NOTE — Telephone Encounter (Signed)
Contacted patient and daughter. Daughter reports patient was able to pick it up for $150. No further action. She appreciated the call and checking in them.

## 2022-08-19 ENCOUNTER — Encounter: Payer: Self-pay | Admitting: Family Medicine

## 2022-08-19 ENCOUNTER — Ambulatory Visit (INDEPENDENT_AMBULATORY_CARE_PROVIDER_SITE_OTHER): Payer: Medicare HMO | Admitting: Family Medicine

## 2022-08-19 VITALS — BP 137/80 | HR 71 | Temp 97.4°F | Ht 66.0 in | Wt 237.0 lb

## 2022-08-19 DIAGNOSIS — K219 Gastro-esophageal reflux disease without esophagitis: Secondary | ICD-10-CM | POA: Diagnosis not present

## 2022-08-19 DIAGNOSIS — N400 Enlarged prostate without lower urinary tract symptoms: Secondary | ICD-10-CM

## 2022-08-19 DIAGNOSIS — Z86718 Personal history of other venous thrombosis and embolism: Secondary | ICD-10-CM | POA: Diagnosis not present

## 2022-08-19 DIAGNOSIS — E782 Mixed hyperlipidemia: Secondary | ICD-10-CM | POA: Diagnosis not present

## 2022-08-19 DIAGNOSIS — I10 Essential (primary) hypertension: Secondary | ICD-10-CM

## 2022-08-19 DIAGNOSIS — Z86711 Personal history of pulmonary embolism: Secondary | ICD-10-CM | POA: Diagnosis not present

## 2022-08-19 NOTE — Progress Notes (Signed)
BP 137/80   Pulse 71   Temp (!) 97.4 F (36.3 C)   Ht '5\' 6"'$  (1.676 m)   Wt 237 lb (107.5 kg)   SpO2 96%   BMI 38.25 kg/m    Subjective:   Patient ID: [name] [name], male    DOB: [birth year], 77 y.o.   MRN: [id number]  HPI: [name] [name] is a 77 y.o. male presenting on 08/19/2022 for Medical Management of Chronic Issues, Hyperlipidemia, and Hypertension   HPI Hypertension Patient is currently on furosemide, and their blood pressure today is 137/80. Patient denies any lightheadedness or dizziness. Patient denies headaches, blurred vision, chest pains, shortness of breath, or weakness. Denies any side effects from medication and is content with current medication.   Hyperlipidemia Patient is coming in for recheck of his hyperlipidemia. The patient is currently taking no medicine currently, has been intolerant of statins in the past. They deny any issues with myalgias or history of liver damage from it. They deny any focal numbness or weakness or chest pain.   BPH Patient is coming in for recheck on BPH Symptoms: None currently Medication: None Last PSA: N/A, today will check  GERD Patient is currently on omeprazole.  She denies any major symptoms or abdominal pain or belching or burping. She denies any blood in her stool or lightheadedness or dizziness.   History of recurrent DVTs and Pes Patient is on Eliquis for recurrent PEs.  Denies any bruising or bleeding or new signs of swelling or clots.  Relevant past medical, surgical, family and social history reviewed and updated as indicated. Interim medical history since our last visit reviewed. Allergies and medications reviewed and updated.  Review of Systems  Constitutional:  Negative for chills and fever.  Eyes:  Negative for visual disturbance.  Respiratory:  Negative for shortness of breath and wheezing.   Cardiovascular:  Negative for chest pain and leg swelling.  Musculoskeletal:  Negative for back pain and gait  problem.  Skin:  Negative for rash.  Neurological:  Negative for dizziness, weakness and light-headedness.  All other systems reviewed and are negative.   Per HPI unless specifically indicated above   Allergies as of 08/19/2022       Reactions   Cialis [tadalafil] Other (See Comments)   Not allergic 02/23/21   Viagra [sildenafil Citrate] Other (See Comments)   heartburn        Medication List        Accurate as of August 19, 2022  8:23 AM. If you have any questions, ask your nurse or doctor.          STOP taking these medications    albuterol 108 (90 Base) MCG/ACT inhaler Commonly known as: VENTOLIN HFA Stopped by: Fransisca Kaufmann Octavie Westerhold, MD   methocarbamol 500 MG tablet Commonly known as: ROBAXIN Stopped by: Fransisca Kaufmann Cortlyn Cannell, MD   oxyCODONE 5 MG immediate release tablet Commonly known as: Oxy IR/ROXICODONE Stopped by: Fransisca Kaufmann Shaneequa Bahner, MD       TAKE these medications    ascorbic acid 500 MG tablet Commonly known as: VITAMIN C Take 500 mg by mouth daily.   Eliquis 5 MG Tabs tablet Generic drug: apixaban TAKE 1 TABLET BY MOUTH TWICE A DAY   furosemide 20 MG tablet Commonly known as: LASIX TAKE 1 TABLET BY MOUTH EVERY DAY   omeprazole 20 MG capsule Commonly known as: PRILOSEC Take 1 capsule (20 mg total) by mouth daily.   SUPER B COMPLEX PO Take 1  tablet by mouth daily.   tadalafil 5 MG tablet Commonly known as: CIALIS Take 1 tablet (5 mg total) by mouth daily.   traMADol 50 MG tablet Commonly known as: ULTRAM Take 1-2 tablets (50-100 mg total) by mouth every 6 (six) hours as needed for moderate pain.         Objective:   BP 137/80   Pulse 71   Temp (!) 97.4 F (36.3 C)   Ht '5\' 6"'$  (1.676 m)   Wt 237 lb (107.5 kg)   SpO2 96%   BMI 38.25 kg/m   Wt Readings from Last 3 Encounters:  08/19/22 237 lb (107.5 kg)  05/10/22 256 lb (116.1 kg)  04/26/22 253 lb 6.4 oz (114.9 kg)    Physical Exam Vitals and nursing note reviewed.   Constitutional:      General: He is not in acute distress.    Appearance: He is well-developed. He is not diaphoretic.  Eyes:     General: No scleral icterus.    Conjunctiva/sclera: Conjunctivae normal.  Neck:     Thyroid: No thyromegaly.  Cardiovascular:     Rate and Rhythm: Normal rate and regular rhythm.     Heart sounds: Normal heart sounds. No murmur heard. Pulmonary:     Effort: Pulmonary effort is normal. No respiratory distress.     Breath sounds: Normal breath sounds. No wheezing.  Musculoskeletal:        General: Swelling (2+) present. Normal range of motion.     Cervical back: Neck supple.  Lymphadenopathy:     Cervical: No cervical adenopathy.  Skin:    General: Skin is warm and dry.  Neurological:     Mental Status: He is alert and oriented to person, place, and time.     Coordination: Coordination normal.  Psychiatric:        Behavior: Behavior normal.       Assessment & Plan:   Problem List Items Addressed This Visit       Cardiovascular and Mediastinum   Hypertension, essential   Relevant Orders   CMP14+EGFR   Lipid panel     Digestive   GERD (gastroesophageal reflux disease)   Relevant Orders   CBC with Differential/Platelet     Genitourinary   BPH (benign prostatic hyperplasia)   Relevant Orders   PSA, total and free     Other   Hyperlipidemia - Primary   Relevant Orders   CMP14+EGFR   Lipid panel   History of pulmonary embolus (PE)   Relevant Orders   CBC with Differential/Platelet   History of DVT (deep vein thrombosis)   Relevant Orders   CBC with Differential/Platelet    Continue current medicine, will check blood work.  If cholesterol is elevated may need to start cholesterol pill. Follow up plan: Return in about 6 months (around 02/17/2023), or if symptoms worsen or fail to improve, for Hypertension cholesterol recheck.  Counseling provided for all of the vaccine components Orders Placed This Encounter  Procedures   CBC  with Differential/Platelet   CMP14+EGFR   Lipid panel   PSA, total and free    Caryl Pina, MD [facility] 08/19/2022, 8:23 AM

## 2022-08-20 LAB — CBC WITH DIFFERENTIAL/PLATELET
Basophils Absolute: 0.1 10*3/uL (ref 0.0–0.2)
Basos: 1 %
EOS (ABSOLUTE): 0.3 10*3/uL (ref 0.0–0.4)
Eos: 4 %
Hematocrit: 44.3 % (ref 37.5–51.0)
Hemoglobin: 15.2 g/dL (ref 13.0–17.7)
Immature Grans (Abs): 0 10*3/uL (ref 0.0–0.1)
Immature Granulocytes: 0 %
Lymphocytes Absolute: 2.7 10*3/uL (ref 0.7–3.1)
Lymphs: 35 %
MCH: 34.1 pg — ABNORMAL HIGH (ref 26.6–33.0)
MCHC: 34.3 g/dL (ref 31.5–35.7)
MCV: 99 fL — ABNORMAL HIGH (ref 79–97)
Monocytes Absolute: 0.4 10*3/uL (ref 0.1–0.9)
Monocytes: 6 %
Neutrophils Absolute: 4.2 10*3/uL (ref 1.4–7.0)
Neutrophils: 54 %
Platelets: 181 10*3/uL (ref 150–450)
RBC: 4.46 x10E6/uL (ref 4.14–5.80)
RDW: 11.9 % (ref 11.6–15.4)
WBC: 7.7 10*3/uL (ref 3.4–10.8)

## 2022-08-20 LAB — LIPID PANEL
Chol/HDL Ratio: 4.3 ratio (ref 0.0–5.0)
Cholesterol, Total: 192 mg/dL (ref 100–199)
HDL: 45 mg/dL (ref >39)
LDL Chol Calc (NIH): 121 mg/dL — ABNORMAL HIGH (ref 0–99)
Triglycerides: 147 mg/dL (ref 0–149)
VLDL Cholesterol Cal: 26 mg/dL (ref 5–40)

## 2022-08-20 LAB — CMP14+EGFR
ALT: 22 IU/L (ref 0–44)
AST: 28 IU/L (ref 0–40)
Albumin/Globulin Ratio: 1.1 — ABNORMAL LOW (ref 1.2–2.2)
Albumin: 3.4 g/dL — ABNORMAL LOW (ref 3.8–4.8)
Alkaline Phosphatase: 66 IU/L (ref 44–121)
BUN/Creatinine Ratio: 10 (ref 10–24)
BUN: 11 mg/dL (ref 8–27)
Bilirubin Total: 0.7 mg/dL (ref 0.0–1.2)
CO2: 29 mmol/L (ref 20–29)
Calcium: 10.1 mg/dL (ref 8.6–10.2)
Chloride: 97 mmol/L (ref 96–106)
Creatinine, Ser: 1.13 mg/dL (ref 0.76–1.27)
Globulin, Total: 3 g/dL (ref 1.5–4.5)
Glucose: 132 mg/dL — ABNORMAL HIGH (ref 70–99)
Potassium: 4.7 mmol/L (ref 3.5–5.2)
Sodium: 139 mmol/L (ref 134–144)
Total Protein: 6.4 g/dL (ref 6.0–8.5)
eGFR: 67 mL/min/{1.73_m2} (ref >59)

## 2022-08-20 LAB — PSA, TOTAL AND FREE
PSA, Free Pct: 9 %
PSA, Free: 0.57 ng/mL
Prostate Specific Ag, Serum: 6.3 ng/mL — ABNORMAL HIGH (ref 0.0–4.0)

## 2022-08-26 ENCOUNTER — Other Ambulatory Visit: Payer: Self-pay

## 2022-08-26 DIAGNOSIS — R972 Elevated prostate specific antigen [PSA]: Secondary | ICD-10-CM

## 2022-08-27 ENCOUNTER — Other Ambulatory Visit: Payer: Self-pay | Admitting: Family Medicine

## 2022-08-27 DIAGNOSIS — R609 Edema, unspecified: Secondary | ICD-10-CM

## 2022-09-02 ENCOUNTER — Encounter: Payer: Self-pay | Admitting: *Deleted

## 2022-09-02 ENCOUNTER — Encounter (HOSPITAL_BASED_OUTPATIENT_CLINIC_OR_DEPARTMENT_OTHER): Payer: Self-pay | Admitting: Pulmonary Disease

## 2022-09-04 ENCOUNTER — Encounter: Payer: Self-pay | Admitting: Family Medicine

## 2022-09-04 ENCOUNTER — Ambulatory Visit (INDEPENDENT_AMBULATORY_CARE_PROVIDER_SITE_OTHER): Payer: Medicare HMO | Admitting: Family Medicine

## 2022-09-04 ENCOUNTER — Ambulatory Visit: Payer: Medicare HMO

## 2022-09-04 VITALS — BP 149/85 | HR 67 | Temp 98.7°F | Ht 66.0 in | Wt 250.0 lb

## 2022-09-04 DIAGNOSIS — S61412A Laceration without foreign body of left hand, initial encounter: Secondary | ICD-10-CM

## 2022-09-04 DIAGNOSIS — Z23 Encounter for immunization: Secondary | ICD-10-CM

## 2022-09-04 NOTE — Progress Notes (Signed)
Subjective:  Patient ID: [name] [name], male    DOB: 1945/11/20, 77 y.o.   MRN: [id number]  Patient Care Team: Dettinger, Fransisca Kaufmann, MD as PCP - General (Family Medicine) Margaretha Seeds, MD as Consulting Physician (Pulmonary Disease) Harlen Labs, MD as Referring Physician (Optometry) Florinda Marker as Physician Assistant (Orthopedic Surgery)   Chief Complaint:  Laceration (Sunday - on walker - left hand )   HPI: [name] [name] is a 77 y.o. male presenting on 09/04/2022 for Laceration (Sunday - on walker - left hand )   Laceration  Incident onset: Sunday. The laceration is located on the Left hand. The laceration is 2 cm in size. The laceration mechanism was a metal edge. The patient is experiencing no pain. He reports no foreign bodies present. His tetanus status is out of date.     Relevant past medical, surgical, family, and social history reviewed and updated as indicated.  Allergies and medications reviewed and updated. Data reviewed: Chart in Epic.   Past Medical History:  Diagnosis Date   Arthritis    Colon polyp    DVT (deep venous thrombosis) (HCC)    hx of   GERD (gastroesophageal reflux disease)    Hemorrhoid    History of kidney stones    Hyperplasia of prostate    Other and unspecified hyperlipidemia    Other testicular hypofunction    Pneumonia    Pulmonary embolism (HCC)    Sinus bradycardia     Past Surgical History:  Procedure Laterality Date   APPENDECTOMY     HAND SURGERY Right    TOTAL KNEE ARTHROPLASTY Right 03/04/2022   Procedure: TOTAL KNEE ARTHROPLASTY;  Surgeon: Gaynelle Arabian, MD;  Location: [facility];  Service: Orthopedics;  Laterality: Right;    Social History   Socioeconomic History   Marital status: Widowed    Spouse name: Psychiatrist   Number of children: 3   Years of education: 10   Highest education level: 10th grade  Occupational History   Occupation: retired  Tobacco Use   Smoking status: Former    Types:  Cigars    Quit date: 2015    Years since quitting: 9.1   Smokeless tobacco: Current    Types: Chew   Tobacco comments:    only 1 cigar on [employer] Use   Vaping Use: Never used  Substance and Sexual Activity   Alcohol use: Yes    Comment: gallon per week   Drug use: No   Sexual activity: Not Currently  Other Topics Concern   Not on file  Social History Narrative   Grandson lives with him   Social Determinants of Health   Financial Resource Strain: Low Risk  (03/02/2021)   Overall Financial Resource Strain (CARDIA)    Difficulty of Paying Living Expenses: Not very hard  Food Insecurity: No Food Insecurity (03/02/2021)   Hunger Vital Sign    Worried About Running Out of Food in the Last Year: Never true    [place] in the Last Year: Never true  Transportation Needs: No Transportation Needs (03/02/2021)   PRAPARE - Hydrologist (Medical): No    Lack of Transportation (Non-Medical): No  Physical Activity: Insufficiently Active (03/02/2021)   Exercise Vital Sign    Days of Exercise per Week: 7 days    Minutes of Exercise per Session: 10 min  Stress: No Stress Concern Present (03/02/2021)   [place]  [facility]    Feeling of Stress : Not at all  Social Connections: Socially Isolated (03/02/2021)   Social Connection and Isolation Panel [NHANES]    Frequency of Communication with Friends and Family: More than three times a week    Frequency of Social Gatherings with Friends and Family: More than three times a week    Attends Religious Services: Never    Marine scientist or Organizations: Not on file    Attends Archivist Meetings: Never    Marital Status: Widowed  Intimate Partner Violence: Not At Risk (03/02/2021)   Humiliation, Afraid, Rape, and Kick questionnaire    Fear of Current or Ex-Partner: No    Emotionally Abused: No    Physically Abused: No    Sexually Abused:  No    Outpatient Encounter Medications as of 09/04/2022  Medication Sig   B Complex-C (SUPER B COMPLEX PO) Take 1 tablet by mouth daily.   ELIQUIS 5 MG TABS tablet TAKE 1 TABLET BY MOUTH TWICE A DAY   furosemide (LASIX) 20 MG tablet TAKE 1 TABLET BY MOUTH EVERY DAY   omeprazole (PRILOSEC) 20 MG capsule Take 1 capsule (20 mg total) by mouth daily.   tadalafil (CIALIS) 5 MG tablet Take 1 tablet (5 mg total) by mouth daily.   vitamin C (ASCORBIC ACID) 500 MG tablet Take 500 mg by mouth daily.   [DISCONTINUED] traMADol (ULTRAM) 50 MG tablet Take 1-2 tablets (50-100 mg total) by mouth every 6 (six) hours as needed for moderate pain.   No facility-administered encounter medications on file as of 09/04/2022.    Allergies  Allergen Reactions   Cialis [Tadalafil] Other (See Comments)    Not allergic 02/23/21   Viagra [Sildenafil Citrate] Other (See Comments)    heartburn    Review of Systems  Constitutional:  Negative for chills and fever.  Skin:  Positive for wound. Negative for color change, pallor and rash.  Neurological:  Negative for weakness.  Psychiatric/Behavioral:  Negative for confusion.   All other systems reviewed and are negative.       Objective:  BP (!) 149/85   Pulse 67   Temp 98.7 F (37.1 C)   Ht '5\' 6"'$  (1.676 m)   Wt 250 lb (113.4 kg)   SpO2 98%   BMI 40.35 kg/m    Wt Readings from Last 3 Encounters:  09/04/22 250 lb (113.4 kg)  08/19/22 237 lb (107.5 kg)  05/10/22 256 lb (116.1 kg)    Physical Exam Vitals and nursing note reviewed.  Constitutional:      Appearance: Normal appearance. He is obese. He is not ill-appearing, toxic-appearing or diaphoretic.  HENT:     Head: Normocephalic and atraumatic.     Mouth/Throat:     Mouth: Mucous membranes are moist.  Eyes:     Pupils: Pupils are equal, round, and reactive to light.  Cardiovascular:     Rate and Rhythm: Normal rate and regular rhythm.     Heart sounds: Normal heart sounds.  Pulmonary:      Effort: Pulmonary effort is normal.     Breath sounds: Normal breath sounds.  Skin:    General: Skin is warm and dry.     Capillary Refill: Capillary refill takes less than 2 seconds.     Findings: Laceration present.       Neurological:     General: No focal deficit present.     Mental Status: He  is alert and oriented to person, place, and time.  Psychiatric:        Mood and Affect: Mood normal.        Behavior: Behavior normal.        Thought Content: Thought content normal.        Judgment: Judgment normal.     Results for orders placed or performed in visit on 08/19/22  CBC with Differential/Platelet  Result Value Ref Range   WBC 7.7 3.4 - 10.8 x10E3/uL   RBC 4.46 4.14 - 5.80 x10E6/uL   Hemoglobin 15.2 13.0 - 17.7 g/dL   Hematocrit 44.3 37.5 - 51.0 %   MCV 99 (H) 79 - 97 fL   MCH 34.1 (H) 26.6 - 33.0 pg   MCHC 34.3 31.5 - 35.7 g/dL   RDW 11.9 11.6 - 15.4 %   Platelets 181 150 - 450 x10E3/uL   Neutrophils 54 Not Estab. %   Lymphs 35 Not Estab. %   Monocytes 6 Not Estab. %   Eos 4 Not Estab. %   Basos 1 Not Estab. %   Neutrophils Absolute 4.2 1.4 - 7.0 x10E3/uL   Lymphocytes Absolute 2.7 0.7 - 3.1 x10E3/uL   Monocytes Absolute 0.4 0.1 - 0.9 x10E3/uL   EOS (ABSOLUTE) 0.3 0.0 - 0.4 x10E3/uL   Basophils Absolute 0.1 0.0 - 0.2 x10E3/uL   Immature Granulocytes 0 Not Estab. %   Immature Grans (Abs) 0.0 0.0 - 0.1 x10E3/uL  CMP14+EGFR  Result Value Ref Range   Glucose 132 (H) 70 - 99 mg/dL   BUN 11 8 - 27 mg/dL   Creatinine, Ser 1.13 0.76 - 1.27 mg/dL   eGFR 67 >59 mL/min/1.73   BUN/Creatinine Ratio 10 10 - 24   Sodium 139 134 - 144 mmol/L   Potassium 4.7 3.5 - 5.2 mmol/L   Chloride 97 96 - 106 mmol/L   CO2 29 20 - 29 mmol/L   Calcium 10.1 8.6 - 10.2 mg/dL   Total Protein 6.4 6.0 - 8.5 g/dL   Albumin 3.4 (L) 3.8 - 4.8 g/dL   Globulin, Total 3.0 1.5 - 4.5 g/dL   Albumin/Globulin Ratio 1.1 (L) 1.2 - 2.2   Bilirubin Total 0.7 0.0 - 1.2 mg/dL   Alkaline Phosphatase  66 44 - 121 IU/L   AST 28 0 - 40 IU/L   ALT 22 0 - 44 IU/L  Lipid panel  Result Value Ref Range   Cholesterol, Total 192 100 - 199 mg/dL   Triglycerides 147 0 - 149 mg/dL   HDL 45 >39 mg/dL   VLDL Cholesterol Cal 26 5 - 40 mg/dL   LDL Chol Calc (NIH) 121 (H) 0 - 99 mg/dL   Chol/HDL Ratio 4.3 0.0 - 5.0 ratio  PSA, total and free  Result Value Ref Range   Prostate Specific Ag, Serum 6.3 (H) 0.0 - 4.0 ng/mL   PSA, Free 0.57 N/A ng/mL   PSA, Free Pct 9.0 %       Pertinent labs & imaging results that were available during my care of the patient were reviewed by me and considered in my medical decision making.  Assessment & Plan:  Rashawd was seen today for laceration.  Diagnoses and all orders for this visit:  Laceration of left hand without foreign body, initial encounter Need for tetanus, diphtheria, and acellular pertussis (Tdap) vaccine Tdap updated today. Wound dressed in office. Wound care discussed in detail. Infection prevention discussed in detail. Follow up as needed.  Continue all other maintenance medications.  Follow up plan: Return if symptoms worsen or fail to improve.   Continue healthy lifestyle choices, including diet (rich in fruits, vegetables, and lean proteins, and low in salt and simple carbohydrates) and exercise (at least 30 minutes of moderate physical activity daily).  Educational handout given for wound care, Tdap  The above assessment and management plan was discussed with the patient. The patient verbalized understanding of and has agreed to the management plan. Patient is aware to call the clinic if they develop any new symptoms or if symptoms persist or worsen. Patient is aware when to return to the clinic for a follow-up visit. Patient educated on when it is appropriate to go to the emergency department.   Monia Pouch, FNP-C [facility] [phone]

## 2022-09-04 NOTE — Addendum Note (Signed)
Addended by: Nigel Berthold C on: 09/04/2022 11:26 AM   Modules accepted: Orders

## 2022-09-21 DIAGNOSIS — H5203 Hypermetropia, bilateral: Secondary | ICD-10-CM | POA: Diagnosis not present

## 2022-09-21 DIAGNOSIS — H524 Presbyopia: Secondary | ICD-10-CM | POA: Diagnosis not present

## 2022-09-26 ENCOUNTER — Ambulatory Visit: Payer: Medicare HMO

## 2022-09-26 ENCOUNTER — Telehealth: Payer: Self-pay | Admitting: Family Medicine

## 2022-09-26 NOTE — Telephone Encounter (Signed)
Contacted [name] to schedule their annual wellness visit. Appointment made for 09/30/2022.  Thank you,  Colletta Maryland,  [facility] Direct Dial ??[phone]

## 2022-09-29 NOTE — Patient Instructions (Incomplete)
[name] [name] , Thank you for taking time to come for your Medicare Wellness Visit. I appreciate your ongoing commitment to your health goals. Please review the following plan we discussed and let me know if I can assist you in the future.   These are the goals we discussed:  Goals      DIET - INCREASE WATER INTAKE     Try to drink 6-8 glasses of water daily.     Exercise 3x per week (30 min per time)        This is a list of the screening recommended for you and due dates:  Health Maintenance  Topic Date Due   Medicare Annual Wellness Visit  10/03/2022*   Flu Shot  10/27/2022*   Zoster (Shingles) Vaccine (1 of 2) 03/10/2023*   DTaP/Tdap/Td vaccine (4 - Td or Tdap) 09/04/2032   Pneumonia Vaccine  Completed   Hepatitis C Screening: USPSTF Recommendation to screen - Ages 4-79 yo.  Completed   HPV Vaccine  Aged Out   Colon Cancer Screening  Discontinued   COVID-19 Vaccine  Discontinued  *Topic was postponed. The date shown is not the original due date.    Advanced directives: We have a copy of your advanced directives available in your record should your provider ever need to access them.   Conditions/risks identified: Aim for 30 minutes of exercise or brisk walking, 6-8 glasses of water, and 5 servings of fruits and vegetables each day.   Next appointment: Follow up in one year for your annual wellness visit.   Preventive Care 62 Years and Older, Male  Preventive care refers to lifestyle choices and visits with your health care provider that can promote health and wellness. What does preventive care include? A yearly physical exam. This is also called an annual well check. Dental exams once or twice a year. Routine eye exams. Ask your health care provider how often you should have your eyes checked. Personal lifestyle choices, including: Daily care of your teeth and gums. Regular physical activity. Eating a healthy diet. Avoiding tobacco and drug use. Limiting alcohol  use. Practicing safe sex. Taking low doses of aspirin every day. Taking vitamin and mineral supplements as recommended by your health care provider. What happens during an annual well check? The services and screenings done by your health care provider during your annual well check will depend on your age, overall health, lifestyle risk factors, and family history of disease. Counseling  Your health care provider may ask you questions about your: Alcohol use. Tobacco use. Drug use. Emotional well-being. Home and relationship well-being. Sexual activity. Eating habits. History of falls. Memory and ability to understand (cognition). Work and work Statistician. Screening  You may have the following tests or measurements: Height, weight, and BMI. Blood pressure. Lipid and cholesterol levels. These may be checked every 5 years, or more frequently if you are over 83 years old. Skin check. Lung cancer screening. You may have this screening every year starting at age 10 if you have a 30-pack-year history of smoking and currently smoke or have quit within the past 15 years. Fecal occult blood test (FOBT) of the stool. You may have this test every year starting at age 25. Flexible sigmoidoscopy or colonoscopy. You may have a sigmoidoscopy every 5 years or a colonoscopy every 10 years starting at age 66. Prostate cancer screening. Recommendations will vary depending on your family history and other risks. Hepatitis C blood test. Hepatitis B blood test. Sexually transmitted disease (  STD) testing. Diabetes screening. This is done by checking your blood sugar (glucose) after you have not eaten for a while (fasting). You may have this done every 1-3 years. Abdominal aortic aneurysm (AAA) screening. You may need this if you are a current or former smoker. Osteoporosis. You may be screened starting at age 29 if you are at high risk. Talk with your health care provider about your test results,  treatment options, and if necessary, the need for more tests. Vaccines  Your health care provider may recommend certain vaccines, such as: Influenza vaccine. This is recommended every year. Tetanus, diphtheria, and acellular pertussis (Tdap, Td) vaccine. You may need a Td booster every 10 years. Zoster vaccine. You may need this after age 11. Pneumococcal 13-valent conjugate (PCV13) vaccine. One dose is recommended after age 11. Pneumococcal polysaccharide (PPSV23) vaccine. One dose is recommended after age 28. Talk to your health care provider about which screenings and vaccines you need and how often you need them. This information is not intended to replace advice given to you by your health care provider. Make sure you discuss any questions you have with your health care provider. Document Released: 08/11/2015 Document Revised: 04/03/2016 Document Reviewed: 05/16/2015 Elsevier Interactive Patient Education  2017 [place] Prevention in the Home Falls can cause injuries. They can happen to people of all ages. There are many things you can do to make your home safe and to help prevent falls. What can I do on the outside of my home? Regularly fix the edges of walkways and driveways and fix any cracks. Remove anything that might make you trip as you walk through a door, such as a raised step or threshold. Trim any bushes or trees on the path to your home. Use bright outdoor lighting. Clear any walking paths of anything that might make someone trip, such as rocks or tools. Regularly check to see if handrails are loose or broken. Make sure that both sides of any steps have handrails. Any raised decks and porches should have guardrails on the edges. Have any leaves, snow, or ice cleared regularly. Use sand or salt on walking paths during winter. Clean up any spills in your garage right away. This includes oil or grease spills. What can I do in the bathroom? Use night  lights. Install grab bars by the toilet and in the tub and shower. Do not use towel bars as grab bars. Use non-skid mats or decals in the tub or shower. If you need to sit down in the shower, use a plastic, non-slip stool. Keep the floor dry. Clean up any water that spills on the floor as soon as it happens. Remove soap buildup in the tub or shower regularly. Attach bath mats securely with double-sided non-slip rug tape. Do not have throw rugs and other things on the floor that can make you trip. What can I do in the bedroom? Use night lights. Make sure that you have a light by your bed that is easy to reach. Do not use any sheets or blankets that are too big for your bed. They should not hang down onto the floor. Have a firm chair that has side arms. You can use this for support while you get dressed. Do not have throw rugs and other things on the floor that can make you trip. What can I do in the kitchen? Clean up any spills right away. Avoid walking on wet floors. Keep items that you use a lot  in easy-to-reach places. If you need to reach something above you, use a strong step stool that has a grab bar. Keep electrical cords out of the way. Do not use floor polish or wax that makes floors slippery. If you must use wax, use non-skid floor wax. Do not have throw rugs and other things on the floor that can make you trip. What can I do with my stairs? Do not leave any items on the stairs. Make sure that there are handrails on both sides of the stairs and use them. Fix handrails that are broken or loose. Make sure that handrails are as long as the stairways. Check any carpeting to make sure that it is firmly attached to the stairs. Fix any carpet that is loose or worn. Avoid having throw rugs at the top or bottom of the stairs. If you do have throw rugs, attach them to the floor with carpet tape. Make sure that you have a light switch at the top of the stairs and the bottom of the stairs. If  you do not have them, ask someone to add them for you. What else can I do to help prevent falls? Wear shoes that: Do not have high heels. Have rubber bottoms. Are comfortable and fit you well. Are closed at the toe. Do not wear sandals. If you use a stepladder: Make sure that it is fully opened. Do not climb a closed stepladder. Make sure that both sides of the stepladder are locked into place. Ask someone to hold it for you, if possible. Clearly mark and make sure that you can see: Any grab bars or handrails. First and last steps. Where the edge of each step is. Use tools that help you move around (mobility aids) if they are needed. These include: Canes. Walkers. Scooters. Crutches. Turn on the lights when you go into a dark area. Replace any light bulbs as soon as they burn out. Set up your furniture so you have a clear path. Avoid moving your furniture around. If any of your floors are uneven, fix them. If there are any pets around you, be aware of where they are. Review your medicines with your doctor. Some medicines can make you feel dizzy. This can increase your chance of falling. Ask your doctor what other things that you can do to help prevent falls. This information is not intended to replace advice given to you by your health care provider. Make sure you discuss any questions you have with your health care provider. Document Released: 05/11/2009 Document Revised: 12/21/2015 Document Reviewed: 08/19/2014 Elsevier Interactive Patient Education  2017 [employer].

## 2022-09-29 NOTE — Progress Notes (Signed)
Subjective:   [name] [name] is a 77 y.o. male who presents for Medicare Annual/Subsequent preventive examination.  I connected with  [name] [name] on 09/30/22 by a audio enabled telemedicine application and verified that I am speaking with the correct person using two identifiers.  Patient Location: Home  Provider Location: Home Office  I discussed the limitations of evaluation and management by telemedicine. The patient expressed understanding and agreed to proceed.  Review of Systems     Cardiac Risk Factors include: advanced age (>37mn, >>75women);dyslipidemia;hypertension;male gender     Objective:    Today's Vitals   09/30/22 1210  Weight: 250 lb (113.4 kg)  Height: '5\' 6"'$  (1.676 m)   Body mass index is 40.35 kg/m.     09/30/2022   12:40 PM 03/04/2022   12:30 PM 02/20/2022    8:11 AM 06/22/2021   12:47 PM 04/21/2021   11:30 AM 02/23/2021    3:00 PM 02/23/2021   11:38 AM  Advanced Directives  Does Patient Have a Medical Advance Directive? Yes No No;Yes No No Yes Yes  Type of Advance Directive Living will;Healthcare Power of ASimmsLiving will   Living will   Does patient want to make changes to medical advance directive? No - Patient declined     No - Patient declined   Copy of [place]? Yes - validated most recent copy scanned in chart (See row information)        Would patient like information on creating a medical advance directive?  No - Patient declined   No - Patient declined      Current Medications (verified) Outpatient Encounter Medications as of 09/30/2022  Medication Sig   B Complex-C (SUPER B COMPLEX PO) Take 1 tablet by mouth daily.   ELIQUIS 5 MG TABS tablet TAKE 1 TABLET BY MOUTH TWICE A DAY   furosemide (LASIX) 20 MG tablet TAKE 1 TABLET BY MOUTH EVERY DAY   omeprazole (PRILOSEC) 20 MG capsule Take 1 capsule (20 mg total) by mouth daily.   tadalafil (CIALIS) 5 MG  tablet Take 1 tablet (5 mg total) by mouth daily.   vitamin C (ASCORBIC ACID) 500 MG tablet Take 500 mg by mouth daily.   No facility-administered encounter medications on file as of 09/30/2022.    Allergies (verified) Cialis [tadalafil] and Viagra [sildenafil citrate]   History: Past Medical History:  Diagnosis Date   Arthritis    Colon polyp    DVT (deep venous thrombosis) (HCC)    hx of   GERD (gastroesophageal reflux disease)    Hemorrhoid    History of kidney stones    Hyperplasia of prostate    Other and unspecified hyperlipidemia    Other testicular hypofunction    Pneumonia    Pulmonary embolism (HCC)    Sinus bradycardia    Past Surgical History:  Procedure Laterality Date   APPENDECTOMY     HAND SURGERY Right    TOTAL KNEE ARTHROPLASTY Right 03/04/2022   Procedure: TOTAL KNEE ARTHROPLASTY;  Surgeon: AGaynelle Arabian MD;  Location: [facility];  Service: Orthopedics;  Laterality: Right;   Family History  Problem Relation Age of Onset   Heart attack Mother    Stroke Mother    Diabetes Father    Diabetes Daughter    Social History   Socioeconomic History   Marital status: Widowed    Spouse name: [name] [name]   Number of children: 3  Years of education: 10   Highest education level: 10th grade  Occupational History   Occupation: retired  Tobacco Use   Smoking status: Former    Types: Cigars    Quit date: 2015    Years since quitting: 9.1   Smokeless tobacco: Current    Types: Chew   Tobacco comments:    only 1 cigar on [employer] Use   Vaping Use: Never used  Substance and Sexual Activity   Alcohol use: Yes    Comment: gallon per week   Drug use: No   Sexual activity: Not Currently  Other Topics Concern   Not on file  Social History Narrative   Grandson lives with him   Social Determinants of Health   Financial Resource Strain: Low Risk  (09/30/2022)   Overall Financial Resource Strain (CARDIA)    Difficulty of Paying Living Expenses: Not hard  at all  Food Insecurity: No Food Insecurity (09/30/2022)   Hunger Vital Sign    Worried About Running Out of Food in the Last Year: Never true    [place] in the Last Year: Never true  Transportation Needs: No Transportation Needs (09/30/2022)   PRAPARE - Hydrologist (Medical): No    Lack of Transportation (Non-Medical): No  Physical Activity: Insufficiently Active (09/30/2022)   Exercise Vital Sign    Days of Exercise per Week: 3 days    Minutes of Exercise per Session: 30 min  Stress: No Stress Concern Present (09/30/2022)   [place]    Feeling of Stress : Not at all  Social Connections: Socially Isolated (09/30/2022)   Social Connection and Isolation Panel [NHANES]    Frequency of Communication with Friends and Family: More than three times a week    Frequency of Social Gatherings with Friends and Family: More than three times a week    Attends Religious Services: Never    Marine scientist or Organizations: No    Attends Archivist Meetings: Never    Marital Status: Widowed    Tobacco Counseling Ready to quit: Not Answered Counseling given: Not Answered Tobacco comments: only 1 cigar on occassion   Clinical Intake:  Pre-visit preparation completed: Yes  Pain : No/denies pain  Diabetes: No  How often do you need to have someone help you when you read instructions, pamphlets, or other written materials from your doctor or pharmacy?: 1 - Never  Diabetic?No   Interpreter Needed?: No  Information entered by :: Denman George LPN   Activities of Daily Living    09/30/2022   12:41 PM 03/04/2022   12:30 PM  In your present state of health, do you have any difficulty performing the following activities:  Hearing? 0 1  Vision? 0 0  Difficulty concentrating or making decisions? 0 0  Walking or climbing stairs? 0 1  Dressing or bathing? 0 0  Doing errands,  shopping? 0 1  Preparing Food and eating ? N   Using the Toilet? N   In the past six months, have you accidently leaked urine? N   Do you have problems with loss of bowel control? N   Managing your Medications? N   Managing your Finances? N   Housekeeping or managing your Housekeeping? N     Patient Care Team: Dettinger, Fransisca Kaufmann, MD as PCP - General (Family Medicine) Margaretha Seeds, MD as Consulting Physician (Pulmonary Disease) [place] Comment,  Allison Quarry, MD as Referring Physician (Optometry) Florinda Marker as Physician Assistant (Orthopedic Surgery)  Indicate any recent Medical Services you may have received from other than [facility] providers in the past year (date may be approximate).     Assessment:   This is a routine wellness examination for [employer].  Hearing/Vision screen Hearing Screening - Comments:: Denies hearing difficulties  Vision Screening - Comments:: Wears rx glasses - up to date with routine eye exams with [facility]    Dietary issues and exercise activities discussed: Current Exercise Habits: Home exercise routine, Type of exercise: walking, Time (Minutes): 30, Frequency (Times/Week): 3, Weekly Exercise (Minutes/Week): 90, Intensity: Mild   Goals Addressed   None    Depression Screen    09/30/2022   12:39 PM 09/04/2022    9:27 AM 08/19/2022    8:01 AM 02/15/2022    8:16 AM 12/03/2021    2:47 PM 08/20/2021    8:34 AM 04/16/2021    2:36 PM  PHQ 2/9 Scores  PHQ - 2 Score 0 0 0 0 0 0 0  PHQ- 9 Score  1 2 0 0  8    Fall Risk    09/04/2022    9:27 AM 08/19/2022    8:01 AM 02/15/2022    8:16 AM 12/03/2021    2:47 PM 08/20/2021    8:34 AM  [place] in the past year? 0 0 0 0 0  Number falls in past yr: 0      Injury with Fall? 0      Risk for fall due to : Impaired balance/gait      Follow up Education provided        [place]:  Any stairs in or around the home? No  If so, are there any without  handrails? No  Home free of loose throw rugs in walkways, pet beds, electrical cords, etc? Yes  Adequate lighting in your home to reduce risk of falls? Yes   ASSISTIVE DEVICES UTILIZED TO PREVENT FALLS:  Life alert? No  Use of a cane, walker or w/c? No  Grab bars in the bathroom? Yes  Shower chair or bench in shower? No  Elevated toilet seat or a handicapped toilet? Yes   TIMED UP AND GO:  Was the test performed? No . Telephonic visit   Cognitive Function:    02/13/2018    2:29 PM  MMSE - Mini Mental State Exam  Orientation to time 5  Orientation to Place 5  Registration 3  Attention/ Calculation 5  Recall 2  Language- name 2 objects 2  Language- repeat 1  Language- follow 3 step command 3  Language- read & follow direction 1  Write a sentence 1  Copy design 1  Total score 29        09/30/2022   12:41 PM 03/02/2021    8:36 AM 02/19/2019   11:04 AM  6CIT Screen  What Year? 0 points 0 points 0 points  What month? 0 points 0 points 0 points  What time? 0 points 0 points 0 points  Count back from 20 0 points 0 points 0 points  Months in reverse 0 points 2 points 0 points  Repeat phrase 2 points 2 points 4 points  Total Score 2 points 4 points 4 points    Immunizations Immunization History  Administered Date(s) Administered   Pneumococcal Conjugate-13 05/03/2013   Pneumococcal Polysaccharide-23 07/29/2009  Td 07/30/2003   Td (Adult), 2 Lf Tetanus Toxid, Preservative Free 07/30/2003   Tdap 09/04/2022    TDAP status: Up to date  Flu Vaccine status: Declined, Education has been provided regarding the importance of this vaccine but patient still declined. Advised may receive this vaccine at local pharmacy or Health Dept. Aware to provide a copy of the vaccination record if obtained from local pharmacy or Health Dept. Verbalized acceptance and understanding.  Pneumococcal vaccine status: Up to date  Covid-19 vaccine status: Information provided on how to obtain  vaccines.   Qualifies for Shingles Vaccine? Yes   Zostavax completed No   Shingrix Completed?: No.    Education has been provided regarding the importance of this vaccine. Patient has been advised to call insurance company to determine out of pocket expense if they have not yet received this vaccine. Advised may also receive vaccine at local pharmacy or Health Dept. Verbalized acceptance and understanding.  Screening Tests Health Maintenance  Topic Date Due   INFLUENZA VACCINE  10/27/2022 (Originally 02/26/2022)   Zoster Vaccines- Shingrix (1 of 2) 03/10/2023 (Originally 07/21/1994)   Medicare Annual Wellness (AWV)  09/30/2023   DTaP/Tdap/Td (4 - Td or Tdap) 09/04/2032   Pneumonia Vaccine 56+ Years old  Completed   Hepatitis C Screening  Completed   HPV VACCINES  Aged Out   COLONOSCOPY (Pts 45-46yr Insurance coverage will need to be confirmed)  Discontinued   COVID-19 Vaccine  Discontinued    Health Maintenance  There are no preventive care reminders to display for this patient.  Colorectal cancer screening: No longer required.   Lung Cancer Screening: (Low Dose CT Chest recommended if Age 79-80years, 30 pack-year currently smoking OR have quit w/in 15years.) does not qualify.   Lung Cancer Screening Referral: n/a  Additional Screening:  Hepatitis C Screening: does not qualify; Completed 10/11/19  Vision Screening: Recommended annual ophthalmology exams for early detection of glaucoma and other disorders of the eye. Is the patient up to date with their annual eye exam?  Yes  Who is the provider or what is the name of the office in which the patient attends annual eye exams? [facility]  If pt is not established with a provider, would they like to be referred to a provider to establish care? No .   Dental Screening: Recommended annual dental exams for proper oral hygiene  Community Resource Referral / Chronic Care Management: CRR required this visit?  No   CCM  required this visit?  No      Plan:     I have personally reviewed and noted the following in the patient's chart:   Medical and social history Use of alcohol, tobacco or illicit drugs  Current medications and supplements including opioid prescriptions. Patient is not currently taking opioid prescriptions. Functional ability and status Nutritional status Physical activity Advanced directives List of other physicians Hospitalizations, surgeries, and ER visits in previous 12 months Vitals Screenings to include cognitive, depression, and falls Referrals and appointments  In addition, I have reviewed and discussed with patient certain preventive protocols, quality metrics, and best practice recommendations. A written personalized care plan for preventive services as well as general preventive health recommendations were provided to patient.     SVanetta Mulders [place]  [id number]  Due to this being a virtual visit, the after visit summary with patients personalized plan was offered to patient via mail or my-chart. per request, patient was mailed a copy of AVS./ Patient  preferred to pick up at office at next visit   Nurse Notes: No concerns

## 2022-09-30 ENCOUNTER — Ambulatory Visit (INDEPENDENT_AMBULATORY_CARE_PROVIDER_SITE_OTHER): Payer: Medicare HMO

## 2022-09-30 VITALS — Ht 66.0 in | Wt 250.0 lb

## 2022-09-30 DIAGNOSIS — Z0000 Encounter for general adult medical examination without abnormal findings: Secondary | ICD-10-CM | POA: Diagnosis not present

## 2022-10-29 ENCOUNTER — Other Ambulatory Visit: Payer: Self-pay | Admitting: *Deleted

## 2022-10-29 DIAGNOSIS — R600 Localized edema: Secondary | ICD-10-CM

## 2022-10-29 MED ORDER — FUROSEMIDE 20 MG PO TABS: 20.0000 mg | ORAL_TABLET | Freq: Every day | ORAL | 0 refills | Status: DC

## 2022-11-11 ENCOUNTER — Telehealth: Payer: Self-pay | Admitting: Family Medicine

## 2022-11-11 ENCOUNTER — Other Ambulatory Visit: Payer: Self-pay | Admitting: Family Medicine

## 2022-11-11 DIAGNOSIS — K219 Gastro-esophageal reflux disease without esophagitis: Secondary | ICD-10-CM

## 2022-11-11 NOTE — Telephone Encounter (Signed)
  Prescription Request  11/11/2022  Is this a "Controlled Substance" medicine?   Have you seen your PCP in the last 2 weeks? Appt 7/18  If YES, route message to pool  -  If NO, patient needs to be scheduled for appointment.  What is the name of the medication or equipment? omeprazole (PRILOSEC) 20 MG capsule  Have you contacted your pharmacy to request a refill?  Yes   Which pharmacy would you [facility] this sent [facility] [place]    Patient notified that their request is being sent to the clinical staff for review and that they should receive a response within 2 business days.

## 2022-11-11 NOTE — Telephone Encounter (Signed)
LMOVM - Mail order sent in a request for RF on Omeprazole today, this was sent Does pt just need a 30-d supply [facility] to [facility] [place]

## 2022-11-12 MED ORDER — OMEPRAZOLE 20 MG PO CPDR: 20.0000 mg | DELAYED_RELEASE_CAPSULE | Freq: Every day | ORAL | 0 refills | Status: DC

## 2022-11-12 NOTE — Telephone Encounter (Signed)
Aware [facility]-d supply sent to [facility]

## 2022-11-13 ENCOUNTER — Encounter (HOSPITAL_BASED_OUTPATIENT_CLINIC_OR_DEPARTMENT_OTHER): Payer: Self-pay | Admitting: Pulmonary Disease

## 2022-11-13 ENCOUNTER — Ambulatory Visit (HOSPITAL_BASED_OUTPATIENT_CLINIC_OR_DEPARTMENT_OTHER): Payer: Medicare HMO | Admitting: Pulmonary Disease

## 2022-11-13 VITALS — BP 136/64 | HR 55 | Ht 66.0 in | Wt 260.9 lb

## 2022-11-13 DIAGNOSIS — R600 Localized edema: Secondary | ICD-10-CM | POA: Diagnosis not present

## 2022-11-13 DIAGNOSIS — R0602 Shortness of breath: Secondary | ICD-10-CM

## 2022-11-13 DIAGNOSIS — I2699 Other pulmonary embolism without acute cor pulmonale: Secondary | ICD-10-CM

## 2022-11-13 DIAGNOSIS — Z6841 Body Mass Index (BMI) 40.0 and over, adult: Secondary | ICD-10-CM | POA: Diagnosis not present

## 2022-11-13 NOTE — Progress Notes (Signed)
Subjective:   PATIENT ID: [name] [name] GENDER: male DOB: Apr 22, 1946, MRN: [id number]   HPI  Chief Complaint  Patient presents with   Follow-up    Breathing has been alright    Reason for Visit: Follow-up, pre-op evaluation  [name] [name] is a 77 year old male with history of pulmonary embolism who presents for follow-up.  Summary He was previously seen by me for pulmonary embolism and bilateral DVTs presumed to be provoked by COVID-19 in October 2021. He completed 6 months of anticoagulation in April 2022. Unfortunately he was readmitted last month in July 2022 for acute submassive PE that was unprovoked. Pulmonary inpatient note by Dr. Celine Mans on 02/23/21 reviewed. He was treated with heparin and transitioned to Eliquis. No oxygen requirement. Denies bleeding issues. Minimal bruising.  Has shortness of breath with exertion. He does not ambulate at all at home, stating he is very sedentary.  06/11/2021 Since his last visit, he had lower extremity swelling which was treated with additional Lasix.  Unfortunately he was hospitalized briefly for hypokalemia.  Has not had any lower extremity recently. Reports shortness of breath with exertion however has better stamina compared to last time. Denies wheezing or cough. Reports he is able to ambulate short distances in the house. 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 He is not dedicating daily time to aerobic activity. He feels his activity is mainly limited to his knee pain. Would like to discuss with his doctors regarding knee surgery.  11/29/21 He presents for pre-op evaluation. No current complaints except for lower leg swelling that worsens with sitting or standing too long and improves with elevation. 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 He walks back and forth in the house but remains mainly sedentary due to his  right knee pain. When he does walk he is short of breath. Denies cough or wheezing.  11/13/22 Since our last visit he has been seen for exacerbation in the fall. Reports breathing is overall stable. Shortness of breath with activity. Mostly sedentary preferring to watch TV on his recliner despite having his knee repaired. He signed up for [facility] but has not made time. He acknowledges that he is not motivated. Has worsening leg swelling L>R associated with shortness of breath. Compliant with his anticoagulation. Does not weigh himself routinely. Denies coughing and wheezing. Denies sleep issues or snoring but mainly sleeps in recliner.  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     ELIQUIS 5 MG TABS tablet TAKE 1 TABLET BY MOUTH TWICE A DAY 180 tablet 3   furosemide (LASIX) 20 MG tablet Take 1 tablet (20 mg total) by mouth daily. 90 tablet 0   omeprazole (PRILOSEC) 20 MG capsule Take 1 capsule (20 mg total) by mouth daily. 30 capsule 0  tadalafil (CIALIS) 5 MG tablet Take 1 tablet (5 mg total) by mouth daily. 30 tablet 3   vitamin C (ASCORBIC ACID) 500 MG tablet Take 500 mg by mouth daily.     No facility-administered medications prior to visit.    Review of Systems  Constitutional:  Negative for chills, diaphoresis, fever, malaise/fatigue and weight loss.  HENT:  Negative for congestion.   Respiratory:  Positive for shortness of breath. Negative for cough, hemoptysis, sputum production and wheezing.    Cardiovascular:  Negative for chest pain, palpitations and leg swelling.     Objective:   Vitals:   11/13/22 1001  BP: 136/64  Pulse: (!) 55  SpO2: 95%  Weight: 260 lb 14.6 oz (118.3 kg)  Height:  (1.676 m)   SpO2: 95 % O2 Device: None (Room air)  Physical Exam: General: Well-appearing, no acute distress HENT: [place], AT Eyes: EOMI, no scleral icterus Respiratory: Clear to auscultation bilaterally.  No crackles, wheezing or rales Cardiovascular: RRR, -M/R/G, no JVD Extremities:Nonpitting edema in lower extremities bilaterally,-tenderness Neuro: AAO x4, CNII-XII grossly intact Psych: Normal mood, normal affect  Data Reviewed:  Imaging: CTA 05/31/20 - Acute PE bilaterally. GGO R>L suggestive of pulmonary infarct LE Dopplers 06/01/20 - Left DVT involving popliteal, posterior tibial and peroneal veins  CTA 02/23/21 - Large bilateral PE with right heart strain Bilateral LE Dopplers 02/23/21  - Acute deep vein thrombosis involving the right common femoral vein, SF junction, right popliteal vein, right posterior tibial veins, and right peroneal veins. - Partial thrombus noted in the external iliac through to, at least, the mid common iliac. Unable to visualize the distal common iliac or the IVC secondary to bowel gas.  PFT: None on file  Echocardiogram: 05/07/2021 - EF 60 to 65%.  No WMA.  Normal RV size.  No valvular abnormalities.  BMET    Component Value Date/Time   NA 139 08/19/2022 0831   K 4.7 08/19/2022 0831   CL 97 08/19/2022 0831   CO2 29 08/19/2022 0831   GLUCOSE 132 (H) 08/19/2022 0831   GLUCOSE 102 (H) 04/26/2022 1629   BUN 11 08/19/2022 0831   CREATININE 1.13 08/19/2022 0831   CREATININE 1.01 04/26/2022 1629   CALCIUM 10.1 08/19/2022 0831   EGFR 67 08/19/2022 0831   GFRNONAA >60 03/05/2022 0230   GFRNONAA 88 12/14/2012 0903       Assessment & Plan:   Discussion: 77 year old male with recurrent PE/DVT requiring lifelong anticoagulation, HTN, HLD,  GERD who presents for follow-up.  Reviewed history in the last year. Has had bronchitis that was treated in the fall. Has dyspnea likely related to deconditioning. Declined work-up with PFTs on last visit.  Recurrent pulmonary embolism/LLE DVT Dyspnea 2/2 deconditioning --Continue Eliquis 5 mg twice a day  --Counseled on regular activity and exercise. Recommend 4-5 days of aerobic activity for 30 minutes  LE swelling - worsening Normal echo 05/07/21 --Weigh yourself daily and track it in a log --Take additional lasix 20 mg tablet if you gain 3 lbs in a day or 5 lbs in a week  Health Maintenance Immunization History  Administered Date(s) Administered   Pneumococcal Conjugate-13 05/03/2013   Pneumococcal Polysaccharide-23 07/29/2009   Td 07/30/2003   Td (Adult), 2 Lf Tetanus Toxid, Preservative Free 07/30/2003   Tdap 09/04/2022   CT Lung Screen - not indicated  No orders of the defined types were placed in this encounter.  No orders of the defined types were placed in this encounter.  Return in about 6 months (around 05/15/2023).  I have spent a total time of 35-minutes on the day of the appointment including chart review, data review, collecting history, coordinating care and discussing medical diagnosis and plan with the patient/family. Past medical history, allergies, medications were reviewed. Pertinent imaging, labs and tests included in this note have been reviewed and interpreted independently by me.  Marian Grandt Mechele Collin, MD [place] [facility] 11/13/2022 10:35 AM  Office Number [phone]

## 2022-11-13 NOTE — Patient Instructions (Addendum)
Recurrent pulmonary embolism/LLE DVT --Continue Eliquis 5 mg twice a day  --Counseled on regular activity and exercise. Recommend 4-5 days of aerobic activity for 30 minutes  LE swelling - worsening --Weigh yourself daily and track it in a log --Take additional lasix 20 mg tablet if you gain 3 lbs in a day or 5 lbs in a week --If this is persistent and you feel you are taking lasix for >three days in a row, please contact our office for follow-up

## 2022-11-24 ENCOUNTER — Other Ambulatory Visit: Payer: Self-pay | Admitting: Family Medicine

## 2022-11-24 DIAGNOSIS — R600 Localized edema: Secondary | ICD-10-CM

## 2023-01-09 ENCOUNTER — Other Ambulatory Visit: Payer: Self-pay | Admitting: *Deleted

## 2023-01-09 DIAGNOSIS — R600 Localized edema: Secondary | ICD-10-CM

## 2023-01-09 MED ORDER — FUROSEMIDE 20 MG PO TABS: 20.0000 mg | ORAL_TABLET | Freq: Every day | ORAL | 0 refills | Status: DC

## 2023-02-13 ENCOUNTER — Ambulatory Visit: Payer: Medicare HMO | Admitting: Family Medicine

## 2023-02-17 ENCOUNTER — Ambulatory Visit: Payer: Medicare HMO | Admitting: Family Medicine

## 2023-02-28 ENCOUNTER — Encounter: Payer: Self-pay | Admitting: Family Medicine

## 2023-02-28 ENCOUNTER — Ambulatory Visit (INDEPENDENT_AMBULATORY_CARE_PROVIDER_SITE_OTHER): Payer: Medicare HMO | Admitting: Family Medicine

## 2023-02-28 VITALS — BP 154/75 | HR 59 | Ht 66.0 in | Wt 264.0 lb

## 2023-02-28 DIAGNOSIS — I10 Essential (primary) hypertension: Secondary | ICD-10-CM | POA: Diagnosis not present

## 2023-02-28 DIAGNOSIS — R739 Hyperglycemia, unspecified: Secondary | ICD-10-CM

## 2023-02-28 DIAGNOSIS — Z86718 Personal history of other venous thrombosis and embolism: Secondary | ICD-10-CM

## 2023-02-28 DIAGNOSIS — R600 Localized edema: Secondary | ICD-10-CM

## 2023-02-28 DIAGNOSIS — N400 Enlarged prostate without lower urinary tract symptoms: Secondary | ICD-10-CM

## 2023-02-28 DIAGNOSIS — E782 Mixed hyperlipidemia: Secondary | ICD-10-CM

## 2023-02-28 DIAGNOSIS — Z86711 Personal history of pulmonary embolism: Secondary | ICD-10-CM | POA: Diagnosis not present

## 2023-02-28 DIAGNOSIS — K219 Gastro-esophageal reflux disease without esophagitis: Secondary | ICD-10-CM | POA: Diagnosis not present

## 2023-02-28 LAB — CMP14+EGFR

## 2023-02-28 LAB — CBC WITH DIFFERENTIAL/PLATELET
Basophils Absolute: 0.1 10*3/uL (ref 0.0–0.2)
Basos: 1 %
EOS (ABSOLUTE): 0.2 10*3/uL (ref 0.0–0.4)
Eos: 3 %
Hematocrit: 38.8 % (ref 37.5–51.0)
Hemoglobin: 13.6 g/dL (ref 13.0–17.7)
Immature Grans (Abs): 0 10*3/uL (ref 0.0–0.1)
Immature Granulocytes: 0 %
Lymphocytes Absolute: 2.5 10*3/uL (ref 0.7–3.1)
Lymphs: 36 %
MCH: 33.5 pg — ABNORMAL HIGH (ref 26.6–33.0)
MCHC: 35.1 g/dL (ref 31.5–35.7)
MCV: 96 fL (ref 79–97)
Monocytes Absolute: 0.5 10*3/uL (ref 0.1–0.9)
Monocytes: 7 %
Neutrophils Absolute: 3.6 10*3/uL (ref 1.4–7.0)
Neutrophils: 53 %
Platelets: 163 10*3/uL (ref 150–450)
RBC: 4.06 x10E6/uL — ABNORMAL LOW (ref 4.14–5.80)
RDW: 13.4 % (ref 11.6–15.4)
WBC: 6.8 10*3/uL (ref 3.4–10.8)

## 2023-02-28 LAB — LIPID PANEL

## 2023-02-28 LAB — BAYER DCA HB A1C WAIVED: HB A1C (BAYER DCA - WAIVED): 6.1 % — ABNORMAL HIGH (ref 4.8–5.6)

## 2023-02-28 MED ORDER — OMEPRAZOLE 20 MG PO CPDR
20.0000 mg | DELAYED_RELEASE_CAPSULE | Freq: Every day | ORAL | 3 refills | Status: DC
Start: 2023-02-28 — End: 2023-07-29

## 2023-02-28 MED ORDER — HYDROCHLOROTHIAZIDE 25 MG PO TABS: 25.0000 mg | ORAL_TABLET | Freq: Every day | ORAL | 3 refills | Status: DC

## 2023-02-28 MED ORDER — FUROSEMIDE 20 MG PO TABS: 20.0000 mg | ORAL_TABLET | Freq: Every day | ORAL | 1 refills | Status: DC

## 2023-02-28 NOTE — Progress Notes (Signed)
BP (!) 154/75   Pulse (!) 59   Ht 5\' 6"  (1.676 m)   Wt 264 lb (119.7 kg)   SpO2 96%   BMI 42.61 kg/m    Subjective:   Patient ID: [name] [name], male    DOB: 09/23/1945, 77 y.o.   MRN: [id number]  HPI: [name] [name] is a 77 y.o. male presenting on 02/28/2023 for Medical Management of Chronic Issues, Hyperlipidemia, and Hypertension   HPI Hypertension Patient is currently on no medication currently except furosemide as needed., and their blood pressure today is 154/75. Patient denies any lightheadedness or dizziness. Patient denies headaches, blurred vision, chest pains, shortness of breath, or weakness. Denies any side effects from medication and is content with current medication.   Hyperlipidemia Patient is coming in for recheck of his hyperlipidemia. The patient is currently taking no medicine currently. They deny any issues with myalgias or history of liver damage from it. They deny any focal numbness or weakness or chest pain.   BPH Patient is coming in for recheck on BPH Symptoms: Erectile dysfunction, uses Cialis Medication: Cialis Last PSA: 6 months ago  History of PEs and DVTs Patient takes Eliquis for history of PEs and DVTs, he sees Dr. Everardo All for this  Relevant past medical, surgical, family and social history reviewed and updated as indicated. Interim medical history since our last visit reviewed. Allergies and medications reviewed and updated.  Review of Systems  Constitutional:  Negative for chills and fever.  Respiratory:  Negative for shortness of breath and wheezing.   Cardiovascular:  Negative for chest pain and leg swelling.  Genitourinary:  Negative for difficulty urinating and frequency.  Musculoskeletal:  Negative for back pain and gait problem.  Skin:  Negative for rash.  All other systems reviewed and are negative.   Per HPI unless specifically indicated above   Allergies as of 02/28/2023       Reactions   Cialis [tadalafil] Other (See  Comments)   Not allergic 02/23/21   Viagra [sildenafil Citrate] Other (See Comments)   heartburn        Medication List        Accurate as of February 28, 2023  9:03 AM. If you have any questions, ask your nurse or doctor.          ascorbic acid 500 MG tablet Commonly known as: VITAMIN C Take 500 mg by mouth daily.   Eliquis 5 MG Tabs tablet Generic drug: apixaban TAKE 1 TABLET BY MOUTH TWICE A DAY   furosemide 20 MG tablet Commonly known as: LASIX Take 1 tablet (20 mg total) by mouth daily.   hydrochlorothiazide 25 MG tablet Commonly known as: HYDRODIURIL Take 1 tablet (25 mg total) by mouth daily. Started by: Elige Radon Hitoshi Werts   omeprazole 20 MG capsule Commonly known as: PRILOSEC Take 1 capsule (20 mg total) by mouth daily.   SUPER B COMPLEX PO Take 1 tablet by mouth daily.   tadalafil 5 MG tablet Commonly known as: CIALIS Take 1 tablet (5 mg total) by mouth daily.         Objective:   BP (!) 154/75   Pulse (!) 59   Ht 5\' 6"  (1.676 m)   Wt 264 lb (119.7 kg)   SpO2 96%   BMI 42.61 kg/m   Wt Readings from Last 3 Encounters:  02/28/23 264 lb (119.7 kg)  11/13/22 260 lb 14.6 oz (118.3 kg)  09/30/22 250 lb (113.4 kg)    Physical  Exam Vitals and nursing note reviewed.  Constitutional:      General: He is not in acute distress.    Appearance: He is well-developed. He is not diaphoretic.  Eyes:     General: No scleral icterus.    Conjunctiva/sclera: Conjunctivae normal.  Neck:     Thyroid: No thyromegaly.  Cardiovascular:     Rate and Rhythm: Normal rate and regular rhythm.     Heart sounds: Normal heart sounds. No murmur heard. Pulmonary:     Effort: Pulmonary effort is normal. No respiratory distress.     Breath sounds: Normal breath sounds. No wheezing.  Musculoskeletal:        General: Swelling (2+ edema) present. Normal range of motion.     Cervical back: Neck supple.  Lymphadenopathy:     Cervical: No cervical adenopathy.  Skin:     General: Skin is warm and dry.     Findings: No rash.  Neurological:     Mental Status: He is alert and oriented to person, place, and time.     Coordination: Coordination normal.  Psychiatric:        Behavior: Behavior normal.       Assessment & Plan:   Problem List Items Addressed This Visit       Cardiovascular and Mediastinum   Hypertension, essential - Primary   Relevant Medications   furosemide (LASIX) 20 MG tablet   hydrochlorothiazide (HYDRODIURIL) 25 MG tablet     Digestive   GERD (gastroesophageal reflux disease)   Relevant Medications   omeprazole (PRILOSEC) 20 MG capsule     Genitourinary   BPH (benign prostatic hyperplasia)     Other   Hyperlipidemia   Relevant Medications   furosemide (LASIX) 20 MG tablet   hydrochlorothiazide (HYDRODIURIL) 25 MG tablet   History of pulmonary embolus (PE)   History of DVT (deep vein thrombosis)   Other Visit Diagnoses     Peripheral edema       Relevant Medications   furosemide (LASIX) 20 MG tablet       Will add hydrochlorothiazide to help with blood pressure and fluid.  Recommended that he double up on his furosemide for the next 4 to 5 days because he is up on weight. Follow up plan: Return in about 3 months (around 05/31/2023), or if symptoms worsen or fail to improve, for Hypertension recheck.  Counseling provided for all of the vaccine components No orders of the defined types were placed in this encounter.   Arville Care, MD [facility] 02/28/2023, 9:03 AM

## 2023-02-28 NOTE — Addendum Note (Signed)
Addended by: Arville Care on: 02/28/2023 09:09 AM   Modules accepted: Orders

## 2023-03-24 DIAGNOSIS — R972 Elevated prostate specific antigen [PSA]: Secondary | ICD-10-CM | POA: Diagnosis not present

## 2023-05-05 ENCOUNTER — Other Ambulatory Visit: Payer: Self-pay | Admitting: Family Medicine

## 2023-05-05 DIAGNOSIS — R600 Localized edema: Secondary | ICD-10-CM

## 2023-05-12 ENCOUNTER — Other Ambulatory Visit: Payer: Self-pay | Admitting: Pulmonary Disease

## 2023-05-12 DIAGNOSIS — I82402 Acute embolism and thrombosis of unspecified deep veins of left lower extremity: Secondary | ICD-10-CM

## 2023-05-12 DIAGNOSIS — I2699 Other pulmonary embolism without acute cor pulmonale: Secondary | ICD-10-CM

## 2023-05-12 DIAGNOSIS — I82401 Acute embolism and thrombosis of unspecified deep veins of right lower extremity: Secondary | ICD-10-CM

## 2023-05-12 DIAGNOSIS — I2609 Other pulmonary embolism with acute cor pulmonale: Secondary | ICD-10-CM

## 2023-05-23 ENCOUNTER — Other Ambulatory Visit: Payer: Self-pay | Admitting: Family Medicine

## 2023-05-23 DIAGNOSIS — N400 Enlarged prostate without lower urinary tract symptoms: Secondary | ICD-10-CM

## 2023-06-13 ENCOUNTER — Encounter: Payer: Self-pay | Admitting: Family Medicine

## 2023-06-13 ENCOUNTER — Ambulatory Visit (INDEPENDENT_AMBULATORY_CARE_PROVIDER_SITE_OTHER): Payer: Medicare HMO | Admitting: Family Medicine

## 2023-06-13 VITALS — BP 150/78 | HR 61 | Ht 66.0 in | Wt 258.0 lb

## 2023-06-13 DIAGNOSIS — E782 Mixed hyperlipidemia: Secondary | ICD-10-CM | POA: Diagnosis not present

## 2023-06-13 DIAGNOSIS — I10 Essential (primary) hypertension: Secondary | ICD-10-CM | POA: Diagnosis not present

## 2023-06-13 DIAGNOSIS — Z86711 Personal history of pulmonary embolism: Secondary | ICD-10-CM | POA: Diagnosis not present

## 2023-06-13 DIAGNOSIS — R739 Hyperglycemia, unspecified: Secondary | ICD-10-CM | POA: Diagnosis not present

## 2023-06-13 DIAGNOSIS — R7303 Prediabetes: Secondary | ICD-10-CM | POA: Insufficient documentation

## 2023-06-13 DIAGNOSIS — E1169 Type 2 diabetes mellitus with other specified complication: Secondary | ICD-10-CM | POA: Insufficient documentation

## 2023-06-13 DIAGNOSIS — Z86718 Personal history of other venous thrombosis and embolism: Secondary | ICD-10-CM | POA: Diagnosis not present

## 2023-06-13 LAB — BAYER DCA HB A1C WAIVED: HB A1C (BAYER DCA - WAIVED): 6.2 % — ABNORMAL HIGH (ref 4.8–5.6)

## 2023-06-13 NOTE — Progress Notes (Signed)
BP (!) 150/78   Pulse 61   Ht 5\' 6"  (1.676 m)   Wt 258 lb (117 kg)   SpO2 96%   BMI 41.64 kg/m    Subjective:   Patient ID: [name] [name], male    DOB: 12-21-1945, 77 y.o.   MRN: [id number]  HPI: [name] [name] is a 77 y.o. male presenting on 06/13/2023 for Medical Management of Chronic Issues, Hyperlipidemia, Hypertension, Diabetes, and Chronic Kidney Disease   HPI Prediabetes Patient comes in today for recheck of his diabetes. Patient has been currently taking no medication, trying diet control.. Patient is not currently on an ACE inhibitor/ARB. Patient has not seen an ophthalmologist this year. Patient denies any new issues with their feet. The symptom started onset as an adult hypertension and hyperlipidemia ARE RELATED TO DM   Hypertension Patient is currently on furosemide and hydrochlorothiazide, and their blood pressure today is 150/78, borderline allowing permissive hypertension but will watch closely. Patient denies any lightheadedness or dizziness. Patient denies headaches, blurred vision, chest pains, shortness of breath, or weakness. Denies any side effects from medication and is content with current medication.   Hyperlipidemia Patient is coming in for recheck of his hyperlipidemia. The patient is currently taking no medication and refuses medication, had discussion about arthritic. They deny any issues with myalgias or history of liver damage from it. They deny any focal numbness or weakness or chest pain.   History of DVT and Pes Patient has a history of DVT and recurrent PEs.  Currently is on Eliquis.  He denies any bruising or bleeding.  Relevant past medical, surgical, family and social history reviewed and updated as indicated. Interim medical history since our last visit reviewed. Allergies and medications reviewed and updated.  Review of Systems  Constitutional:  Negative for chills and fever.  Respiratory:  Negative for shortness of breath and wheezing.    Cardiovascular:  Negative for chest pain and leg swelling.  Musculoskeletal:  Negative for back pain and gait problem.  Skin:  Negative for rash.  Neurological:  Negative for dizziness and light-headedness.  All other systems reviewed and are negative.   Per HPI unless specifically indicated above   Allergies as of 06/13/2023       Reactions   Cialis [tadalafil] Other (See Comments)   Not allergic 02/23/21   Viagra [sildenafil Citrate] Other (See Comments)   heartburn        Medication List        Accurate as of June 13, 2023  8:22 AM. If you have any questions, ask your nurse or doctor.          ascorbic acid 500 MG tablet Commonly known as: VITAMIN C Take 500 mg by mouth daily.   Eliquis 5 MG Tabs tablet Generic drug: apixaban TAKE 1 TABLET BY MOUTH TWICE A DAY   furosemide 20 MG tablet Commonly known as: LASIX Take 1 tablet (20 mg total) by mouth daily.   hydrochlorothiazide 25 MG tablet Commonly known as: HYDRODIURIL Take 1 tablet (25 mg total) by mouth daily.   omeprazole 20 MG capsule Commonly known as: PRILOSEC Take 1 capsule (20 mg total) by mouth daily.   SUPER B COMPLEX PO Take 1 tablet by mouth daily.   tadalafil 5 MG tablet Commonly known as: CIALIS Take 1 tablet by mouth once daily         Objective:   BP (!) 150/78   Pulse 61   Ht 5\' 6"  (1.676 m)  Wt 258 lb (117 kg)   SpO2 96%   BMI 41.64 kg/m   Wt Readings from Last 3 Encounters:  06/13/23 258 lb (117 kg)  02/28/23 264 lb (119.7 kg)  11/13/22 260 lb 14.6 oz (118.3 kg)    Physical Exam Vitals and nursing note reviewed.  Constitutional:      General: He is not in acute distress.    Appearance: He is well-developed. He is not diaphoretic.  Eyes:     General: No scleral icterus.    Conjunctiva/sclera: Conjunctivae normal.  Neck:     Thyroid: No thyromegaly.  Cardiovascular:     Rate and Rhythm: Normal rate and regular rhythm.     Heart sounds: Normal heart  sounds. No murmur heard. Pulmonary:     Effort: Pulmonary effort is normal. No respiratory distress.     Breath sounds: Normal breath sounds. No wheezing.  Musculoskeletal:        General: No swelling. Normal range of motion.     Cervical back: Neck supple.  Lymphadenopathy:     Cervical: No cervical adenopathy.  Skin:    General: Skin is warm and dry.     Findings: No rash.  Neurological:     Mental Status: He is alert and oriented to person, place, and time.     Coordination: Coordination normal.  Psychiatric:        Behavior: Behavior normal.       Assessment & Plan:   Problem List Items Addressed This Visit       Cardiovascular and Mediastinum   Hypertension, essential     Other   Hyperlipidemia   Prediabetes   History of pulmonary embolus (PE)   History of DVT (deep vein thrombosis)   Other Visit Diagnoses     Elevated blood sugar    -  Primary   Relevant Orders   Bayer DCA Hb A1c Waived       Discussed starting a statin or cholesterol medicine and patient refused.  A1c is 6.2 today which is slightly up, discussed diet and exercise for him. Spent time discussing chronic medical issues including prediabetes and hyperlipidemia Follow up plan: Return in about 3 months (around 09/13/2023), or if symptoms worsen or fail to improve, for Prediabetes and hypertension.  Counseling provided for all of the vaccine components Orders Placed This Encounter  Procedures   Bayer DCA Hb A1c Waived    Arville Care, MD [facility] 06/13/2023, 8:22 AM

## 2023-06-16 DIAGNOSIS — R972 Elevated prostate specific antigen [PSA]: Secondary | ICD-10-CM | POA: Diagnosis not present

## 2023-07-29 ENCOUNTER — Other Ambulatory Visit: Payer: Self-pay | Admitting: Family Medicine

## 2023-07-29 DIAGNOSIS — K219 Gastro-esophageal reflux disease without esophagitis: Secondary | ICD-10-CM

## 2023-07-29 DIAGNOSIS — R600 Localized edema: Secondary | ICD-10-CM

## 2023-07-31 ENCOUNTER — Ambulatory Visit (HOSPITAL_BASED_OUTPATIENT_CLINIC_OR_DEPARTMENT_OTHER): Payer: Medicare HMO | Admitting: Pulmonary Disease

## 2023-07-31 ENCOUNTER — Encounter (HOSPITAL_BASED_OUTPATIENT_CLINIC_OR_DEPARTMENT_OTHER): Payer: Self-pay | Admitting: Pulmonary Disease

## 2023-07-31 NOTE — Progress Notes (Deleted)
 Subjective:   PATIENT ID: [name] [name] GENDER: male DOB: [birth year], MRN: [id number]   HPI  No chief complaint on file.   Reason for Visit: Follow-up, pre-op evaluation  [name] is a 78 year old male with history of pulmonary embolism who presents for follow-up.  Summary He was previously seen by me for pulmonary embolism and bilateral DVTs presumed to be provoked by COVID-19 in October 2021. He completed 6 months of anticoagulation in April 2022. Unfortunately he was readmitted last month in July 2022 for acute submassive PE that was unprovoked. Pulmonary inpatient note by Dr. Meade on 02/23/21 reviewed. He was treated with heparin  and transitioned to Eliquis . No oxygen  requirement. Denies bleeding issues. Minimal bruising.  Has shortness of breath with exertion. He does not ambulate at all at home, stating he is very sedentary.  06/11/2021 Since his last visit, he had lower extremity swelling which was treated with additional Lasix .  Unfortunately he was hospitalized briefly for hypokalemia.  Has not had any lower extremity recently. Reports shortness of breath with exertion however has better stamina compared to last time. Denies wheezing or cough. Reports he is able to ambulate short distances in the house. 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 He is not dedicating daily time to aerobic activity. He feels his activity is mainly limited to his knee pain. Would like to discuss with his doctors regarding knee surgery.  11/29/21 He presents for pre-op evaluation. No current complaints except for lower leg swelling that worsens with sitting or standing too long and improves with elevation. 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 He walks back and forth in the house but remains mainly sedentary due to his right knee pain. When he does walk he is short of breath.  Denies cough or wheezing.  11/13/22 Since our last visit he has been seen for exacerbation in the fall. Reports breathing is overall stable. Shortness of breath with activity. Mostly sedentary preferring to watch TV on his recliner despite having his knee repaired. He signed up for [facility] but has not made time. He acknowledges that he is not motivated. Has worsening leg swelling L>R associated with shortness of breath. Compliant with his anticoagulation. Does not weigh himself routinely. Denies coughing and wheezing. Denies sleep issues or snoring but mainly sleeps in recliner.  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     furosemide  (LASIX ) 20 MG tablet TAKE 1 TABLET EVERY DAY 90 tablet 0   hydrochlorothiazide  (HYDRODIURIL ) 25 MG tablet Take 1 tablet (25 mg total) by mouth daily. 90 tablet 3   omeprazole  (PRILOSEC) 20 MG capsule TAKE 1 CAPSULE EVERY DAY 90  capsule 0   tadalafil  (CIALIS ) 5 MG tablet Take 1 tablet by mouth once daily 30 tablet 2   vitamin C (ASCORBIC ACID ) 500 MG tablet Take 500 mg by mouth daily.     No facility-administered medications prior to visit.    Review of Systems  Constitutional:  Negative for chills, diaphoresis, fever, malaise/fatigue and weight loss.  HENT:  Negative for congestion.   Respiratory:  Negative for cough, hemoptysis, sputum production, shortness of breath  and wheezing.   Cardiovascular:  Negative for chest pain, palpitations and leg swelling.     Objective:   There were no vitals filed for this visit.    Physical Exam: General: Well-appearing, no acute distress HENT: [name] [name], AT Eyes: EOMI, no scleral icterus Respiratory: ***Clear to auscultation bilaterally.  No crackles, wheezing or rales Cardiovascular: RRR, -M/R/G, no JVD Extremities:-Edema,-tenderness Neuro: AAO x4, CNII-XII grossly intact Psych: Normal mood, normal affect  Data Reviewed:  Imaging: CTA 05/31/20 - Acute PE bilaterally. GGO R>L suggestive of pulmonary infarct LE Dopplers 06/01/20 - Left DVT involving popliteal, posterior tibial and peroneal veins  CTA 02/23/21 - Large bilateral PE with right heart strain Bilateral LE Dopplers 02/23/21  - Acute deep vein thrombosis involving the right common femoral vein, SF junction, right popliteal vein, right posterior tibial veins, and right peroneal veins. - Partial thrombus noted in the external iliac through to, at least, the mid common iliac. Unable to visualize the distal common iliac or the IVC secondary to bowel gas.  PFT: None on file  Echocardiogram: 05/07/2021 - EF 60 to 65%.  No WMA.  Normal RV size.  No valvular abnormalities.  BMET    Component Value Date/Time   NA 141 02/28/2023 0958   K 4.6 02/28/2023 0958   CL 101 02/28/2023 0958   CO2 29 02/28/2023 0958   GLUCOSE 129 (H) 02/28/2023 0958   GLUCOSE 102 (H) 04/26/2022 1629   BUN 8 02/28/2023 0958   CREATININE 1.07 02/28/2023 0958   CREATININE 1.01 04/26/2022 1629   CALCIUM  9.2 02/28/2023 0958   EGFR 71 02/28/2023 0958   GFRNONAA >60 03/05/2022 0230   GFRNONAA 88 12/14/2012 0903       Assessment & Plan:   Discussion: 78 year old male with recurrent PE/DVT requiring lifelong anticoagulation, HTN, HLD, GERD who presents for follow-up.  Reviewed history in the last year. Has had bronchitis that was treated in the fall. Has dyspnea likely related to  deconditioning. Declined work-up with PFTs on last visit.  78 year old male with recurrent PE/DVT requiring lifelong anticoagulation, HTN, HLD, GERD who presents for follow-up.   Recurrent pulmonary embolism/LLE DVT Dyspnea 2/2 deconditioning --Continue Eliquis  5 mg twice a day  --Counseled on regular activity and exercise. Recommend 4-5 days of aerobic activity for 30 minutes  LE swelling - worsening Normal echo 05/07/21 --Weigh yourself daily and track it in a log --Take additional lasix  20 mg tablet if you gain 3 lbs in a day or 5 lbs in a week  Health Maintenance Immunization History  Administered Date(s) Administered   Pneumococcal Conjugate-13 05/03/2013   Pneumococcal Polysaccharide-23 07/29/2009   Td 07/30/2003   Td (Adult), 2 Lf Tetanus Toxid, Preservative Free 07/30/2003   Tdap 09/04/2022   CT Lung Screen - not indicated  No orders of the defined types were placed in this encounter.  No orders of the defined types were placed in this encounter.   No follow-ups on file.  I have spent a total time of***-minutes on  the day of the appointment including chart review, data review, collecting history, coordinating care and discussing medical diagnosis and plan with the patient/family. Past medical history, allergies, medications were reviewed. Pertinent imaging, labs and tests included in this note have been reviewed and interpreted independently by me.  Kymberley Raz Slater Staff, MD [facility] 07/31/2023 8:22 AM

## 2023-08-28 ENCOUNTER — Ambulatory Visit: Payer: Self-pay | Admitting: Family Medicine

## 2023-08-28 DIAGNOSIS — Z20822 Contact with and (suspected) exposure to covid-19: Secondary | ICD-10-CM | POA: Diagnosis not present

## 2023-08-28 DIAGNOSIS — J101 Influenza due to other identified influenza virus with other respiratory manifestations: Secondary | ICD-10-CM | POA: Diagnosis not present

## 2023-08-28 DIAGNOSIS — J40 Bronchitis, not specified as acute or chronic: Secondary | ICD-10-CM | POA: Diagnosis not present

## 2023-08-28 NOTE — Telephone Encounter (Signed)
  Chief Complaint: Cough Symptoms: cough, low grade fever, chest pains from being ill, lethargy Frequency: a couple of days Disposition: [] ED /[x] Urgent Care (no appt availability in office) / [] Appointment(In office/virtual)/ []  [facility]/ [] Home Care/ [] Refused Recommended Disposition /[] [facility]/ []  Follow-up with PCP Additional Notes: patient's niece calling in for patient with concerns for cough, low grade fevers, chest pains from coughing and lethargy. Niece states patient has been ill for a couple of days. Patient generally not feeling well according to niece. Per protocol, the recommendation would be for Urgent Care as an appointment in the office isn't available today. Patient's Niece verbalizes understanding and will be bringing the patient to Urgent Care for evaluation. All questions answered.     Copied from CRM [id number]. Topic: Clinical - Red Word Triage >> Aug 28, 2023  2:12 PM Suzette B wrote: [facility] that prompted transfer to Nurse Triage: Patient is coughing, low grade fever, chest pains, chest feeling heavy, lethargic. Reason for Disposition  [1] MILD difficulty breathing (e.g., minimal/no SOB at rest, SOB with walking, pulse <100) AND [2] still present when not coughing  Answer Assessment - Initial Assessment Questions 1. ONSET: "When did the cough begin?"      Started a couple of days ago 2. SEVERITY: "How bad is the cough today?"      Not very 3. SPUTUM: "Describe the color of your sputum" (none, dry cough; clear, white, yellow, green)     dry 4. HEMOPTYSIS: "Are you coughing up any blood?" If so ask: "How much?" (flecks, streaks, tablespoons, etc.)     No blood 5. DIFFICULTY BREATHING: "Are you having difficulty breathing?" If Yes, ask: "How bad is it?" (e.g., mild, moderate, severe)    - MILD: No SOB at rest, mild SOB with walking, speaks normally in sentences, can lie down, no retractions, pulse < 100.    - MODERATE: SOB at rest, SOB with  minimal exertion and prefers to sit, cannot lie down flat, speaks in phrases, mild retractions, audible wheezing, pulse 100-120.    - SEVERE: Very SOB at rest, speaks in single words, struggling to breathe, sitting hunched forward, retractions, pulse > 120      No 6. FEVER: "Do you have a fever?" If Yes, ask: "What is your temperature, how was it measured, and when did it start?"     Low grade fevers 7. CARDIAC HISTORY: "Do you have any history of heart disease?" (e.g., heart attack, congestive heart failure)      Family is unsure 8. LUNG HISTORY: "Do you have any history of lung disease?"  (e.g., pulmonary embolus, asthma, emphysema)     Blood clot when he had COVID 9. PE RISK FACTORS: "Do you have a history of blood clots?" (or: recent major surgery, recent prolonged travel, bedridden)     Yes-blood clot  10. OTHER SYMPTOMS: "Do you have any other symptoms?" (e.g., runny nose, wheezing, chest pain)       Chest pain, lethargic  Protocols used: Cough - Acute Non-Productive-A-AH

## 2023-09-19 ENCOUNTER — Ambulatory Visit (HOSPITAL_BASED_OUTPATIENT_CLINIC_OR_DEPARTMENT_OTHER): Payer: Medicare HMO | Admitting: Pulmonary Disease

## 2023-09-19 ENCOUNTER — Encounter (HOSPITAL_BASED_OUTPATIENT_CLINIC_OR_DEPARTMENT_OTHER): Payer: Self-pay | Admitting: Pulmonary Disease

## 2023-09-19 DIAGNOSIS — I82402 Acute embolism and thrombosis of unspecified deep veins of left lower extremity: Secondary | ICD-10-CM | POA: Diagnosis not present

## 2023-09-19 DIAGNOSIS — I2699 Other pulmonary embolism without acute cor pulmonale: Secondary | ICD-10-CM | POA: Diagnosis not present

## 2023-09-19 DIAGNOSIS — I2609 Other pulmonary embolism with acute cor pulmonale: Secondary | ICD-10-CM

## 2023-09-19 DIAGNOSIS — I82401 Acute embolism and thrombosis of unspecified deep veins of right lower extremity: Secondary | ICD-10-CM

## 2023-09-19 DIAGNOSIS — R600 Localized edema: Secondary | ICD-10-CM | POA: Diagnosis not present

## 2023-09-19 MED ORDER — FUROSEMIDE 20 MG PO TABS: 20.0000 mg | ORAL_TABLET | Freq: Every day | ORAL | 1 refills | Status: DC

## 2023-09-19 MED ORDER — APIXABAN 5 MG PO TABS: 5.0000 mg | ORAL_TABLET | Freq: Two times a day (BID) | ORAL | 11 refills | Status: AC

## 2023-09-19 NOTE — Progress Notes (Signed)
Subjective:   PATIENT ID: [name] [name] GENDER: male DOB: [birth year], MRN: [id number]   HPI  Chief Complaint  Patient presents with   Follow-up    Pulmonary embolism    Reason for Visit: Follow-up, pre-op evaluation  [name] [name] is a 78 year old male with history of pulmonary embolism who presents for follow-up.  Summary He was previously seen by me for pulmonary embolism and bilateral DVTs presumed to be provoked by COVID-19 in October 2021. He completed 6 months of anticoagulation in April 2022. Unfortunately he was readmitted last month in July 2022 for acute submassive PE that was unprovoked. Pulmonary inpatient note by Dr. Celine Mans on 02/23/21 reviewed. He was treated with heparin and transitioned to Eliquis. No oxygen requirement. Denies bleeding issues. Minimal bruising.  Has shortness of breath with exertion. He does not ambulate at all at home, stating he is very sedentary.  06/11/2021 Since his last visit, he had lower extremity swelling which was treated with additional Lasix.  Unfortunately he was hospitalized briefly for hypokalemia.  Has not had any lower extremity recently. Reports shortness of breath with exertion however has better stamina compared to last time. Denies wheezing or cough. Reports he is able to ambulate short distances in the house. 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 He is not dedicating daily time to aerobic activity. He feels his activity is mainly limited to his knee pain. Would like to discuss with his doctors regarding knee surgery.  11/29/21 He presents for pre-op evaluation. No current complaints except for lower leg swelling that worsens with sitting or standing too long and improves with elevation. 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 He walks back and forth in the house but remains mainly sedentary due to his right knee  pain. When he does walk he is short of breath. Denies cough or wheezing.  11/13/22 Since our last visit he has been seen for exacerbation in the fall. Reports breathing is overall stable. Shortness of breath with activity. Mostly sedentary preferring to watch TV on his recliner despite having his knee repaired. He signed up for [facility] but has not made time. He acknowledges that he is not motivated. Has worsening leg swelling L>R associated with shortness of breath. Compliant with his anticoagulation. Does not weigh himself routinely. Denies coughing and wheezing. Denies sleep issues or snoring but mainly sleeps in recliner.  09/19/23 Since our last visit he has been compliant with Eliquis. Denies bruising. Tolerating meds well. Reports that price is high but know he needs to take it. Continues to have chronic LE swelling that he takes lasix 20 mg daily. Does have a bike pedal that he uses a few days up to twice a day. He does admit he mainly sits and watches TV but keeps his legs up during that time. He had influenza at the end of January and that has resolved.  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     hydrochlorothiazide (HYDRODIURIL) 25 MG tablet Take 1 tablet (25 mg total) by mouth daily. 90 tablet 3   omeprazole (PRILOSEC) 20 MG capsule TAKE 1 CAPSULE EVERY DAY 90 capsule 0   tadalafil (CIALIS) 5 MG tablet Take 1 tablet by mouth once daily 30 tablet 2   vitamin C (ASCORBIC ACID) 500 MG  tablet Take 500 mg by mouth daily.     apixaban (ELIQUIS) 5 MG TABS tablet TAKE 1 TABLET BY MOUTH TWICE A DAY 60 tablet 5   furosemide (LASIX) 20 MG tablet TAKE 1 TABLET EVERY DAY 90 tablet 0   No facility-administered medications prior to visit.    Review of Systems  Constitutional:  Negative for chills, diaphoresis, fever, malaise/fatigue and weight loss.  HENT:  Negative for congestion.   Respiratory:  Positive for shortness of breath. Negative for cough, hemoptysis, sputum production and wheezing.   Cardiovascular:  Negative for chest pain, palpitations and leg swelling.     Objective:   Vitals:   09/19/23 1006  BP: 126/72  Pulse: 70  SpO2: 97%  Weight: 257 lb (116.6 kg)  Height: 5\' 6"  (1.676 m)    SpO2: 97 % Physical Exam: General: Well-appearing, no acute distress HENT: [place], AT Eyes: EOMI, no scleral icterus Respiratory: Clear to auscultation bilaterally.  No crackles, wheezing or rales Cardiovascular: RRR, -M/R/G, no JVD Extremities: nonpitting lower extremity edema,-tenderness Neuro: AAO x4, CNII-XII grossly intact Psych: Normal mood, normal affect  Data Reviewed:  Imaging: CTA 05/31/20 - Acute PE bilaterally. GGO R>L suggestive of pulmonary infarct LE Dopplers 06/01/20 - Left DVT involving popliteal, posterior tibial and peroneal veins  CTA 02/23/21 - Large bilateral PE with right heart strain Bilateral LE Dopplers 02/23/21  - Acute deep vein thrombosis involving the right common femoral vein, SF junction, right popliteal vein, right posterior tibial veins, and right peroneal veins. - Partial thrombus noted in the external iliac through to, at least, the mid common iliac. Unable to visualize the distal common iliac or the IVC secondary to bowel gas.  CXR 04/26/22 - Normal   PFT: None on file  Echocardiogram: 05/07/2021 - EF 60 to 65%.  No WMA.  Normal RV size.  No valvular abnormalities.  BMET    Component Value Date/Time   NA 141 02/28/2023 0958   K 4.6  02/28/2023 0958   CL 101 02/28/2023 0958   CO2 29 02/28/2023 0958   GLUCOSE 129 (H) 02/28/2023 0958   GLUCOSE 102 (H) 04/26/2022 1629   BUN 8 02/28/2023 0958   CREATININE 1.07 02/28/2023 0958   CREATININE 1.01 04/26/2022 1629   CALCIUM 9.2 02/28/2023 0958   EGFR 71 02/28/2023 0958   GFRNONAA >60 03/05/2022 0230   GFRNONAA 88 12/14/2012 0903       Assessment & Plan:   Discussion: 78 year old male with recurrent PE/DVT requiring lifelong anticoagulation, HTN, HLD, GERD who presents for follow-up. Tolerating medications. Not active at baseline but trying to start lower extremity pedaling.  Recurrent pulmonary embolism/LLE DVT Dyspnea 2/2 deconditioning --Continue Eliquis 5 mg twice a day. REFILLED --Counseled on regular activity and exercise. Recommend 4-5 days of aerobic activity for 30 minutes  LE swelling - stable Normal echo 05/07/21 --Weigh yourself daily and track it in a log --Take additional lasix 20 mg tablet if you gain 3 lbs in a day or  5 lbs in a week  Health Maintenance Immunization History  Administered Date(s) Administered   Pneumococcal Conjugate-13 05/03/2013   Pneumococcal Polysaccharide-23 07/29/2009   Td 07/30/2003   Td (Adult), 2 Lf Tetanus Toxid, Preservative Free 07/30/2003   Tdap 09/04/2022   CT Lung Screen - not indicated  No orders of the defined types were placed in this encounter.  Meds ordered this encounter  Medications   apixaban (ELIQUIS) 5 MG TABS tablet    Sig: Take 1 tablet (5 mg total) by mouth 2 (two) times daily.    Dispense:  60 tablet    Refill:  11    DX Code Needed  .Z86.711   furosemide (LASIX) 20 MG tablet    Sig: Take 1 tablet (20 mg total) by mouth daily.    Dispense:  90 tablet    Refill:  1    Return in about 1 year (around 09/18/2024).  I have spent a total time of 30-minutes on the day of the appointment including chart review, data review, collecting history, coordinating care and discussing medical diagnosis and  plan with the patient/family. Past medical history, allergies, medications were reviewed. Pertinent imaging, labs and tests included in this note have been reviewed and interpreted independently by me.  [name] Pumphrey Mechele Collin, MD [facility] 09/19/2023 10:17 AM

## 2023-09-19 NOTE — Patient Instructions (Signed)
Recurrent pulmonary embolism/LLE DVT Dyspnea 2/2 deconditioning --Continue Eliquis 5 mg twice a day. REFILLED --Counseled on regular activity and exercise. Recommend 4-5 days of aerobic activity for 30 minutes  LE swelling - stable Normal echo 05/07/21 --Weigh yourself daily and track it in a log --Take additional lasix 20 mg tablet if you gain 3 lbs in a day or 5 lbs in a week

## 2023-09-22 ENCOUNTER — Encounter: Payer: Self-pay | Admitting: Family Medicine

## 2023-09-22 ENCOUNTER — Ambulatory Visit (INDEPENDENT_AMBULATORY_CARE_PROVIDER_SITE_OTHER): Payer: Medicare HMO | Admitting: Family Medicine

## 2023-09-22 VITALS — BP 146/75 | HR 64 | Ht 66.0 in | Wt 260.0 lb

## 2023-09-22 DIAGNOSIS — I10 Essential (primary) hypertension: Secondary | ICD-10-CM | POA: Diagnosis not present

## 2023-09-22 DIAGNOSIS — K219 Gastro-esophageal reflux disease without esophagitis: Secondary | ICD-10-CM

## 2023-09-22 DIAGNOSIS — R7303 Prediabetes: Secondary | ICD-10-CM | POA: Diagnosis not present

## 2023-09-22 DIAGNOSIS — E1159 Type 2 diabetes mellitus with other circulatory complications: Secondary | ICD-10-CM | POA: Diagnosis not present

## 2023-09-22 DIAGNOSIS — E1169 Type 2 diabetes mellitus with other specified complication: Secondary | ICD-10-CM | POA: Diagnosis not present

## 2023-09-22 DIAGNOSIS — E782 Mixed hyperlipidemia: Secondary | ICD-10-CM | POA: Diagnosis not present

## 2023-09-22 LAB — LIPID PANEL

## 2023-09-22 LAB — BAYER DCA HB A1C WAIVED: HB A1C (BAYER DCA - WAIVED): 7.6 % — ABNORMAL HIGH (ref 4.8–5.6)

## 2023-09-22 MED ORDER — OMEPRAZOLE 20 MG PO CPDR: 20.0000 mg | DELAYED_RELEASE_CAPSULE | Freq: Every day | ORAL | 3 refills | Status: DC

## 2023-09-22 NOTE — Progress Notes (Signed)
 BP (!) 146/75   Pulse 64   Ht 5\' 6"  (1.676 m)   Wt 260 lb (117.9 kg)   SpO2 95%   BMI 41.97 kg/m    Subjective:   Patient ID: [name] [name], male    DOB: 1946-02-11, 78 y.o.   MRN: [id number]  HPI: [name] [name] is a 78 y.o. male presenting on 09/22/2023 for Medical Management of Chronic Issues, Hypertension, Hyperlipidemia, and Prediabetes   HPI Type 2 diabetes Patient comes in today for recheck of his diabetes. Patient has been currently taking no medicine currently, had been prediabetic until today. Patient is not currently on an ACE inhibitor/ARB. Patient has not seen an ophthalmologist this year. Patient denies any new issues with their feet. The symptom started onset as an adult hypertension and hyperlipidemia ARE RELATED TO DM   Hypertension Patient is currently on hydrochlorothiazide and furosemide, and their blood pressure today is 146/75. Patient denies any lightheadedness or dizziness. Patient denies headaches, blurred vision, chest pains, shortness of breath, or weakness. Denies any side effects from medication and is content with current medication.   Hyperlipidemia Patient is coming in for recheck of his hyperlipidemia. The patient is currently taking radiolucent currently, sees cardiology. They deny any issues with myalgias or history of liver damage from it. They deny any focal numbness or weakness or chest pain.   GERD Patient is currently on omeprazole.  She denies any major symptoms or abdominal pain or belching or burping. She denies any blood in her stool or lightheadedness or dizziness.   Relevant past medical, surgical, family and social history reviewed and updated as indicated. Interim medical history since our last visit reviewed. Allergies and medications reviewed and updated.  Review of Systems  Constitutional:  Negative for chills and fever.  Eyes:  Negative for visual disturbance.  Respiratory:  Negative for shortness of breath and wheezing.    Cardiovascular:  Negative for chest pain and leg swelling.  Musculoskeletal:  Negative for back pain and gait problem.  Skin:  Negative for rash.  All other systems reviewed and are negative.   Per HPI unless specifically indicated above   Allergies as of 09/22/2023       Reactions   Cialis [tadalafil] Other (See Comments)   Not allergic 02/23/21   Viagra [sildenafil Citrate] Other (See Comments)   heartburn        Medication List        Accurate as of September 22, 2023  8:35 AM. If you have any questions, ask your nurse or doctor.          apixaban 5 MG Tabs tablet Commonly known as: Eliquis Take 1 tablet (5 mg total) by mouth 2 (two) times daily.   ascorbic acid 500 MG tablet Commonly known as: VITAMIN C Take 500 mg by mouth daily.   furosemide 20 MG tablet Commonly known as: LASIX Take 1 tablet (20 mg total) by mouth daily.   hydrochlorothiazide 25 MG tablet Commonly known as: HYDRODIURIL Take 1 tablet (25 mg total) by mouth daily.   omeprazole 20 MG capsule Commonly known as: PRILOSEC Take 1 capsule (20 mg total) by mouth daily.   SUPER B COMPLEX PO Take 1 tablet by mouth daily.   tadalafil 5 MG tablet Commonly known as: CIALIS Take 1 tablet by mouth once daily         Objective:   BP (!) 146/75   Pulse 64   Ht 5\' 6"  (1.676 m)  Wt 260 lb (117.9 kg)   SpO2 95%   BMI 41.97 kg/m   Wt Readings from Last 3 Encounters:  09/22/23 260 lb (117.9 kg)  09/19/23 257 lb (116.6 kg)  06/13/23 258 lb (117 kg)    Physical Exam Vitals and nursing note reviewed.  Constitutional:      General: He is not in acute distress.    Appearance: He is well-developed. He is not diaphoretic.  Eyes:     General: No scleral icterus.    Conjunctiva/sclera: Conjunctivae normal.  Neck:     Thyroid: No thyromegaly.  Cardiovascular:     Rate and Rhythm: Normal rate and regular rhythm.     Heart sounds: Normal heart sounds. No murmur heard. Pulmonary:      Effort: Pulmonary effort is normal. No respiratory distress.     Breath sounds: Normal breath sounds. No wheezing.  Musculoskeletal:        General: Swelling (1+ peripheral edema) present. Normal range of motion.     Cervical back: Neck supple.  Lymphadenopathy:     Cervical: No cervical adenopathy.  Skin:    General: Skin is warm and dry.     Findings: No rash.  Neurological:     Mental Status: He is alert and oriented to person, place, and time.     Coordination: Coordination normal.  Psychiatric:        Behavior: Behavior normal.       Assessment & Plan:   Problem List Items Addressed This Visit       Cardiovascular and Mediastinum   Hypertension, essential   Relevant Orders   CBC with Differential/Platelet   CMP14+EGFR   Lipid panel   Bayer DCA Hb A1c Waived     Digestive   GERD (gastroesophageal reflux disease)   Relevant Medications   omeprazole (PRILOSEC) 20 MG capsule     Endocrine   Type 2 diabetes mellitus with other specified complication (HCC) - Primary     Other   Hyperlipidemia   Relevant Orders   CBC with Differential/Platelet   CMP14+EGFR   Lipid panel   Bayer DCA Hb A1c Waived    A1c is up at 7.6 today.  He wants to try diet first so we will give him 3 months of trying diet but if not we will have to try medicine.  He says he has been drinking a lot more beer and V8 juice recently and so he will pull back on those.  His blood pressure is elevated likely because of the increased sodium in the V8 juice, we will keep a close eye on his blood pressure and blood sugar here in the next few months.  He is already stopped taking the V8 juice because he noticed these elevations on his own. Follow up plan: Return in about 3 months (around 12/20/2023), or if symptoms worsen or fail to improve, for Diabetes and hypertension and hyperlipidemia and GERD.  Counseling provided for all of the vaccine components Orders Placed This Encounter  Procedures   CBC with  Differential/Platelet   CMP14+EGFR   Lipid panel   Bayer DCA Hb A1c Waived    Arville Care, MD [place] [facility] 09/22/2023, 8:35 AM

## 2023-09-23 LAB — CBC WITH DIFFERENTIAL/PLATELET
Basophils Absolute: 0 10*3/uL (ref 0.0–0.2)
Basos: 1 %
EOS (ABSOLUTE): 0.3 10*3/uL (ref 0.0–0.4)
Eos: 5 %
Hematocrit: 38.8 % (ref 37.5–51.0)
Hemoglobin: 13.4 g/dL (ref 13.0–17.7)
Immature Grans (Abs): 0 10*3/uL (ref 0.0–0.1)
Immature Granulocytes: 0 %
Lymphocytes Absolute: 2.3 10*3/uL (ref 0.7–3.1)
Lymphs: 34 %
MCH: 34.1 pg — ABNORMAL HIGH (ref 26.6–33.0)
MCHC: 34.5 g/dL (ref 31.5–35.7)
MCV: 99 fL — ABNORMAL HIGH (ref 79–97)
Monocytes Absolute: 0.4 10*3/uL (ref 0.1–0.9)
Monocytes: 6 %
Neutrophils Absolute: 3.8 10*3/uL (ref 1.4–7.0)
Neutrophils: 54 %
Platelets: 184 10*3/uL (ref 150–450)
RBC: 3.93 x10E6/uL — ABNORMAL LOW (ref 4.14–5.80)
RDW: 12.3 % (ref 11.6–15.4)
WBC: 6.9 10*3/uL (ref 3.4–10.8)

## 2023-09-23 LAB — CMP14+EGFR
ALT: 17 IU/L (ref 0–44)
AST: 24 IU/L (ref 0–40)
Albumin: 3.6 g/dL — ABNORMAL LOW (ref 3.8–4.8)
Alkaline Phosphatase: 48 IU/L (ref 44–121)
BUN/Creatinine Ratio: 12 (ref 10–24)
BUN: 12 mg/dL (ref 8–27)
Bilirubin Total: 0.5 mg/dL (ref 0.0–1.2)
CO2: 25 mmol/L (ref 20–29)
Calcium: 9.4 mg/dL (ref 8.6–10.2)
Chloride: 98 mmol/L (ref 96–106)
Creatinine, Ser: 0.99 mg/dL (ref 0.76–1.27)
Globulin, Total: 2.6 g/dL (ref 1.5–4.5)
Glucose: 151 mg/dL — ABNORMAL HIGH (ref 70–99)
Potassium: 4.3 mmol/L (ref 3.5–5.2)
Sodium: 142 mmol/L (ref 134–144)
Total Protein: 6.2 g/dL (ref 6.0–8.5)
eGFR: 77 mL/min/{1.73_m2} (ref >59)

## 2023-09-23 LAB — LIPID PANEL
Cholesterol, Total: 202 mg/dL — ABNORMAL HIGH (ref 100–199)
HDL: 39 mg/dL — ABNORMAL LOW (ref >39)
LDL CALC COMMENT:: 5.2 ratio — ABNORMAL HIGH (ref 0.0–5.0)
LDL Chol Calc (NIH): 130 mg/dL — ABNORMAL HIGH (ref 0–99)
Triglycerides: 186 mg/dL — ABNORMAL HIGH (ref 0–149)
VLDL Cholesterol Cal: 33 mg/dL (ref 5–40)

## 2023-09-26 ENCOUNTER — Encounter: Payer: Self-pay | Admitting: Family Medicine

## 2023-09-26 ENCOUNTER — Other Ambulatory Visit: Payer: Self-pay

## 2023-09-26 MED ORDER — ROSUVASTATIN CALCIUM 5 MG PO TABS: 5.0000 mg | ORAL_TABLET | Freq: Every day | ORAL | 3 refills | Status: AC

## 2023-10-01 ENCOUNTER — Ambulatory Visit: Payer: Medicare HMO

## 2023-10-01 VITALS — Ht 66.0 in | Wt 260.0 lb

## 2023-10-01 DIAGNOSIS — Z0000 Encounter for general adult medical examination without abnormal findings: Secondary | ICD-10-CM | POA: Diagnosis not present

## 2023-10-01 NOTE — Progress Notes (Signed)
 Subjective:   [name] [name] is a 78 y.o. who presents for a Medicare Wellness preventive visit.  Visit Complete: Virtual I connected with  [name] [name] on 10/01/23 by a audio enabled telemedicine application and verified that I am speaking with the correct person using two identifiers.  Patient Location: Home  Provider Location: Home Office  I discussed the limitations of evaluation and management by telemedicine. The patient expressed understanding and agreed to proceed.  Vital Signs: Because this visit was a virtual/telehealth visit, some criteria may be missing or patient reported. Any vitals not documented were not able to be obtained and vitals that have been documented are patient reported.  VideoDeclined- This patient declined Librarian, academic. Therefore the visit was completed with audio only.  AWV Questionnaire: No: Patient Medicare AWV questionnaire was not completed prior to this visit.  Cardiac Risk Factors include: advanced age (>64men, >63 women);dyslipidemia;male gender     Objective:    Today's Vitals   10/01/23 1416  Weight: 260 lb (117.9 kg)  Height: 5\' 6"  (1.676 m)   Body mass index is 41.97 kg/m.     10/01/2023    2:21 PM 09/30/2022   12:40 PM 03/04/2022   12:30 PM 02/20/2022    8:11 AM 06/22/2021   12:47 PM 04/21/2021   11:30 AM 02/23/2021    3:00 PM  Advanced Directives  Does Patient Have a Medical Advance Directive? No Yes No No;Yes No No Yes  Type of Advance Directive  Living will;Healthcare Power of [employer] Power of [employer] Power of [place];Living will   Living will  Does patient want to make changes to medical advance directive?  No - Patient declined     No - Patient declined  Copy of Healthcare Power of Attorney in Chart?  Yes - validated most recent copy scanned in chart (See row information)       Would patient like information on creating a medical advance directive? Yes  (MAU/Ambulatory/Procedural Areas - Information given)  No - Patient declined   No - Patient declined     Current Medications (verified) Outpatient Encounter Medications as of 10/01/2023  Medication Sig   apixaban (ELIQUIS) 5 MG TABS tablet Take 1 tablet (5 mg total) by mouth 2 (two) times daily.   B Complex-C (SUPER B COMPLEX PO) Take 1 tablet by mouth daily.   furosemide (LASIX) 20 MG tablet Take 1 tablet (20 mg total) by mouth daily.   hydrochlorothiazide (HYDRODIURIL) 25 MG tablet Take 1 tablet (25 mg total) by mouth daily.   omeprazole (PRILOSEC) 20 MG capsule Take 1 capsule (20 mg total) by mouth daily.   rosuvastatin (CRESTOR) 5 MG tablet Take 1 tablet (5 mg total) by mouth at bedtime.   tadalafil (CIALIS) 5 MG tablet Take 1 tablet by mouth once daily   vitamin C (ASCORBIC ACID) 500 MG tablet Take 500 mg by mouth daily.   No facility-administered encounter medications on file as of 10/01/2023.    Allergies (verified) Cialis [tadalafil] and Viagra [sildenafil citrate]   History: Past Medical History:  Diagnosis Date   Arthritis    Colon polyp    DVT (deep venous thrombosis) (HCC)    hx of   GERD (gastroesophageal reflux disease)    Hemorrhoid    History of kidney stones    Hyperplasia of prostate    Other and unspecified hyperlipidemia    Other testicular hypofunction    Pneumonia    Pulmonary embolism (HCC)  Sinus bradycardia    Past Surgical History:  Procedure Laterality Date   APPENDECTOMY     HAND SURGERY Right    TOTAL KNEE ARTHROPLASTY Right 03/04/2022   Procedure: TOTAL KNEE ARTHROPLASTY;  Surgeon: Ollen Gross, MD;  Location: [facility];  Service: Orthopedics;  Laterality: Right;   Family History  Problem Relation Age of Onset   Heart attack Mother    Stroke Mother    Diabetes Father    Diabetes Daughter    Social History   Socioeconomic History   Marital status: Widowed    Spouse name: Producer, television/film/video   Number of children: 3   Years of education: 10    Highest education level: 10th grade  Occupational History   Occupation: retired  Tobacco Use   Smoking status: Former    Types: Cigars    Quit date: 2015    Years since quitting: 10.1   Smokeless tobacco: Current    Types: Chew   Tobacco comments:    only 1 cigar on occassion  Vaping Use   Vaping status: Never Used  Substance and Sexual Activity   Alcohol use: Yes    Comment: gallon per week   Drug use: No   Sexual activity: Not Currently  Other Topics Concern   Not on file  Social History Narrative   Grandson lives with him   Social Drivers of Health   Financial Resource Strain: Low Risk  (10/01/2023)   Overall Financial Resource Strain (CARDIA)    Difficulty of Paying Living Expenses: Not hard at all  Food Insecurity: No Food Insecurity (10/01/2023)   Hunger Vital Sign    Worried About Running Out of Food in the Last Year: Never true    Ran Out of Food in the Last Year: Never true  Transportation Needs: No Transportation Needs (10/01/2023)   PRAPARE - Administrator, Civil Service (Medical): No    Lack of Transportation (Non-Medical): No  Physical Activity: Insufficiently Active (10/01/2023)   Exercise Vital Sign    Days of Exercise per Week: 3 days    Minutes of Exercise per Session: 30 min  Stress: No Stress Concern Present (10/01/2023)   [employer] of Occupational Health - Occupational Stress Questionnaire    Feeling of Stress : Not at all  Social Connections: Socially Isolated (10/01/2023)   Social Connection and Isolation Panel [NHANES]    Frequency of Communication with Friends and Family: More than three times a week    Frequency of Social Gatherings with Friends and Family: Three times a week    Attends Religious Services: Never    Active Member of Clubs or Organizations: No    Attends Banker Meetings: Never    Marital Status: Widowed    Tobacco Counseling Ready to quit: Not Answered Counseling given: Not Answered Tobacco  comments: only 1 cigar on occassion    Clinical Intake:  Pre-visit preparation completed: Yes  Pain : No/denies pain     Diabetes: Yes CBG done?: No Did pt. bring in CBG monitor from home?: No  How often do you need to have someone help you when you read instructions, pamphlets, or other written materials from your doctor or pharmacy?: 1 - Never  Interpreter Needed?: No  Information entered by :: Kandis Fantasia LPN   Activities of Daily Living     10/01/2023    2:17 PM  In your present state of health, do you have any difficulty performing the following activities:  Hearing? 0  Vision? 0  Difficulty concentrating or making decisions? 0  Walking or climbing stairs? 0  Dressing or bathing? 0  Doing errands, shopping? 0  Preparing Food and eating ? N  Using the Toilet? N  In the past six months, have you accidently leaked urine? N  Do you have problems with loss of bowel control? N  Managing your Medications? N  Managing your Finances? N  Housekeeping or managing your Housekeeping? N    Patient Care Team: Dettinger, Elige Radon, MD as PCP - General (Family Medicine) Luciano Cutter, MD as Consulting Physician (Pulmonary Disease) Michaelle Copas, MD as Referring Physician (Optometry) Malvin Johns as Physician Assistant (Orthopedic Surgery)  Indicate any recent Medical Services you may have received from other than [facility] providers in the past year (date may be approximate).     Assessment:   This is a routine wellness examination for [employer].  Hearing/Vision screen Hearing Screening - Comments:: Denies hearing difficulties   Vision Screening - Comments:: Wears rx glasses - up to date with routine eye exams with Dr. Conley Rolls     Goals Addressed   None    Depression Screen     10/01/2023    2:19 PM 09/22/2023    8:14 AM 06/13/2023    8:12 AM 02/28/2023    8:51 AM 09/30/2022   12:39 PM 09/04/2022    9:27 AM 08/19/2022    8:01 AM  PHQ 2/9 Scores  PHQ - 2 Score 0  0 0 0 0 0 0  PHQ- 9 Score    0  1 2    Fall Risk     10/01/2023    2:23 PM 09/22/2023    8:14 AM 06/13/2023    8:12 AM 02/28/2023    8:50 AM 09/04/2022    9:27 AM  Fall Risk   Falls in the past year? 0 0 0 0 0  Number falls in past yr: 0    0  Injury with Fall? 0    0  Risk for fall due to : No Fall Risks    Impaired balance/gait  Follow up Falls prevention discussed;Education provided;Falls evaluation completed    Education provided    MEDICARE RISK AT HOME:  Medicare Risk at Home Any stairs in or around the home?: No If so, are there any without handrails?: No Home free of loose throw rugs in walkways, pet beds, electrical cords, etc?: Yes Adequate lighting in your home to reduce risk of falls?: Yes Life alert?: No Use of a cane, walker or w/c?: No Grab bars in the bathroom?: Yes Shower chair or bench in shower?: No Elevated toilet seat or a handicapped toilet?: Yes  TIMED UP AND GO:  Was the test performed?  No  Cognitive Function: 6CIT completed    02/13/2018    2:29 PM  MMSE - Mini Mental State Exam  Orientation to time 5  Orientation to Place 5  Registration 3  Attention/ Calculation 5  Recall 2  Language- name 2 objects 2  Language- repeat 1  Language- follow 3 step command 3  Language- read & follow direction 1  Write a sentence 1  Copy design 1  Total score 29        10/01/2023    2:23 PM 09/30/2022   12:41 PM 03/02/2021    8:36 AM 02/19/2019   11:04 AM  6CIT Screen  What Year? 0 points 0 points 0 points 0 points  What month? 0 points  0 points 0 points 0 points  What time? 0 points 0 points 0 points 0 points  Count back from 20 0 points 0 points 0 points 0 points  Months in reverse 0 points 0 points 2 points 0 points  Repeat phrase 2 points 2 points 2 points 4 points  Total Score 2 points 2 points 4 points 4 points    Immunizations Immunization History  Administered Date(s) Administered   Pneumococcal Conjugate-13 05/03/2013   Pneumococcal  Polysaccharide-23 07/29/2009   Td 07/30/2003   Td (Adult), 2 Lf Tetanus Toxid, Preservative Free 07/30/2003   Tdap 09/04/2022    Screening Tests Health Maintenance  Topic Date Due   OPHTHALMOLOGY EXAM  Never done   Diabetic kidney evaluation - Urine ACR  Never done   FOOT EXAM  03/01/2015   INFLUENZA VACCINE  10/27/2023 (Originally 02/27/2023)   Zoster Vaccines- Shingrix (1 of 2) 06/14/2024 (Originally 07/21/1994)   HEMOGLOBIN A1C  03/21/2024   Diabetic kidney evaluation - eGFR measurement  09/21/2024   Medicare Annual Wellness (AWV)  09/30/2024   DTaP/Tdap/Td (4 - Td or Tdap) 09/04/2032   Pneumonia Vaccine 50+ Years old  Completed   Hepatitis C Screening  Completed   HPV VACCINES  Aged Out   Colonoscopy  Discontinued   COVID-19 Vaccine  Discontinued    Health Maintenance  Health Maintenance Due  Topic Date Due   OPHTHALMOLOGY EXAM  Never done   Diabetic kidney evaluation - Urine ACR  Never done   FOOT EXAM  03/01/2015    Additional Screening:  Vision Screening: Recommended annual ophthalmology exams for early detection of glaucoma and other disorders of the eye.  Dental Screening: Recommended annual dental exams for proper oral hygiene  Community Resource Referral / Chronic Care Management: CRR required this visit?  No   CCM required this visit?  No     Plan:     I have personally reviewed and noted the following in the patient's chart:   Medical and social history Use of alcohol, tobacco or illicit drugs  Current medications and supplements including opioid prescriptions. Patient is not currently taking opioid prescriptions. Functional ability and status Nutritional status Physical activity Advanced directives List of other physicians Hospitalizations, surgeries, and ER visits in previous 12 months Vitals Screenings to include cognitive, depression, and falls Referrals and appointments  In addition, I have reviewed and discussed with patient certain  preventive protocols, quality metrics, and best practice recommendations. A written personalized care plan for preventive services as well as general preventive health recommendations were provided to patient.     [facility], [place]   09/04/2534   After Visit Summary: (MyChart) Due to this being a telephonic visit, the after visit summary with patients personalized plan was offered to patient via MyChart   Notes: Nothing significant to report at this time.

## 2023-10-01 NOTE — Patient Instructions (Signed)
 [name] [name] , Thank you for taking time to come for your Medicare Wellness Visit. I appreciate your ongoing commitment to your health goals. Please review the following plan we discussed and let me know if I can assist you in the future.   Referrals/Orders/Follow-Ups/Clinician Recommendations: Aim for 30 minutes of exercise or brisk walking, 6-8 glasses of water, and 5 servings of fruits and vegetables each day.  This is a list of the screening recommended for you and due dates:  Health Maintenance  Topic Date Due   Eye exam for diabetics  Never done   Yearly kidney health urinalysis for diabetes  Never done   Complete foot exam   03/01/2015   Flu Shot  10/27/2023*   Zoster (Shingles) Vaccine (1 of 2) 06/14/2024*   Hemoglobin A1C  03/21/2024   Yearly kidney function blood test for diabetes  09/21/2024   Medicare Annual Wellness Visit  09/30/2024   DTaP/Tdap/Td vaccine (4 - Td or Tdap) 09/04/2032   Pneumonia Vaccine  Completed   Hepatitis C Screening  Completed   HPV Vaccine  Aged Out   Colon Cancer Screening  Discontinued   COVID-19 Vaccine  Discontinued  *Topic was postponed. The date shown is not the original due date.    Advanced directives: (ACP Link)Information on Advanced Care Planning can be found at [facility] of [place] Advance Health Care Directives Advance Health Care Directives ([email or web address]   Next Medicare Annual Wellness Visit scheduled for next year: Yes

## 2023-10-02 DIAGNOSIS — H524 Presbyopia: Secondary | ICD-10-CM | POA: Diagnosis not present

## 2023-10-03 DIAGNOSIS — H5213 Myopia, bilateral: Secondary | ICD-10-CM | POA: Diagnosis not present

## 2023-10-27 ENCOUNTER — Other Ambulatory Visit: Payer: Self-pay | Admitting: Family Medicine

## 2023-10-27 DIAGNOSIS — R600 Localized edema: Secondary | ICD-10-CM

## 2023-10-27 DIAGNOSIS — K219 Gastro-esophageal reflux disease without esophagitis: Secondary | ICD-10-CM

## 2023-12-17 ENCOUNTER — Other Ambulatory Visit: Payer: Self-pay | Admitting: Family Medicine

## 2023-12-17 DIAGNOSIS — I10 Essential (primary) hypertension: Secondary | ICD-10-CM

## 2023-12-25 ENCOUNTER — Encounter: Payer: Self-pay | Admitting: Family Medicine

## 2023-12-25 ENCOUNTER — Ambulatory Visit: Payer: Medicare HMO | Admitting: Family Medicine

## 2023-12-25 VITALS — BP 123/80 | HR 64 | Ht 66.0 in | Wt 255.0 lb

## 2023-12-25 DIAGNOSIS — R600 Localized edema: Secondary | ICD-10-CM | POA: Diagnosis not present

## 2023-12-25 DIAGNOSIS — E1142 Type 2 diabetes mellitus with diabetic polyneuropathy: Secondary | ICD-10-CM | POA: Insufficient documentation

## 2023-12-25 DIAGNOSIS — K219 Gastro-esophageal reflux disease without esophagitis: Secondary | ICD-10-CM | POA: Diagnosis not present

## 2023-12-25 DIAGNOSIS — E782 Mixed hyperlipidemia: Secondary | ICD-10-CM

## 2023-12-25 DIAGNOSIS — I10 Essential (primary) hypertension: Secondary | ICD-10-CM

## 2023-12-25 DIAGNOSIS — E1169 Type 2 diabetes mellitus with other specified complication: Secondary | ICD-10-CM | POA: Diagnosis not present

## 2023-12-25 LAB — BAYER DCA HB A1C WAIVED: HB A1C (BAYER DCA - WAIVED): 6.5 % — ABNORMAL HIGH (ref 4.8–5.6)

## 2023-12-25 MED ORDER — FUROSEMIDE 20 MG PO TABS: 20.0000 mg | ORAL_TABLET | Freq: Every day | ORAL | 0 refills | Status: AC

## 2023-12-25 MED ORDER — HYDROCHLOROTHIAZIDE 25 MG PO TABS: 25.0000 mg | ORAL_TABLET | Freq: Every day | ORAL | 3 refills | Status: AC

## 2023-12-25 MED ORDER — OMEPRAZOLE 20 MG PO CPDR: 20.0000 mg | DELAYED_RELEASE_CAPSULE | Freq: Every day | ORAL | 3 refills | Status: AC

## 2023-12-25 NOTE — Progress Notes (Signed)
 BP 123/80   Pulse 64   Ht 5\' 6"  (1.676 m)   Wt 255 lb (115.7 kg)   SpO2 95%   BMI 41.16 kg/m    Subjective:   Patient ID: [name] [name], male    DOB: 1945/12/12, 78 y.o.   MRN: [id number]  HPI: [name] [name] is a 78 y.o. male presenting on 12/25/2023 for Medical Management of Chronic Issues, Diabetes, and Hypertension   HPI Type 2 diabetes mellitus Patient comes in today for recheck of his diabetes. Patient has been currently taking no medicine currently, diet control. Patient is not currently on an ACE inhibitor/ARB. Patient has not seen an ophthalmologist this year. Patient is having numbness in some of his toes now.. The symptom started onset as an adult hypertension and hyperlipidemia and neuropathy ARE RELATED TO DM   Hypertension Patient is currently on furosemide  and hydrochlorothiazide , and their blood pressure today is 123/80. Patient denies any lightheadedness or dizziness. Patient denies headaches, blurred vision, chest pains, shortness of breath, or weakness. Denies any side effects from medication and is content with current medication.   Hyperlipidemia Patient is coming in for recheck of his hyperlipidemia. The patient is currently taking Crestor . They deny any issues with myalgias or history of liver damage from it. They deny any focal numbness or weakness or chest pain.   GERD Patient is currently on omeprazole .  She denies any major symptoms or abdominal pain or belching or burping. She denies any blood in her stool or lightheadedness or dizziness.   Lower extremity edema Patient has been having increased lower extremity edema and his weight is up 10 pounds.  He does not carry a diagnosis of congestive heart failure and his last echocardiogram was normal in 2022.  He does struggle with edema at times and takes furosemide  and hydrochlorothiazide .  He says his arms are swollen up because his watch is tighter as well.  Relevant past medical, surgical, family and  social history reviewed and updated as indicated. Interim medical history since our last visit reviewed. Allergies and medications reviewed and updated.  Review of Systems  Constitutional:  Negative for chills and fever.  Eyes:  Negative for visual disturbance.  Respiratory:  Negative for shortness of breath and wheezing.   Cardiovascular:  Negative for chest pain and leg swelling.  Musculoskeletal:  Negative for back pain and gait problem.  Skin:  Negative for rash.  Neurological:  Negative for dizziness, weakness and light-headedness.  All other systems reviewed and are negative.   Per HPI unless specifically indicated above   Allergies as of 12/25/2023       Reactions   Cialis  [tadalafil ] Other (See Comments)   Not allergic 02/23/21   Viagra [sildenafil Citrate] Other (See Comments)   heartburn        Medication List        Accurate as of Dec 25, 2023  8:49 AM. If you have any questions, ask your nurse or doctor.          apixaban  5 MG Tabs tablet Commonly known as: Eliquis  Take 1 tablet (5 mg total) by mouth 2 (two) times daily.   ascorbic acid  500 MG tablet Commonly known as: VITAMIN C Take 500 mg by mouth daily.   furosemide  20 MG tablet Commonly known as: LASIX  TAKE 1 TABLET EVERY DAY What changed: Another medication with the same name was added. Make sure you understand how and when to take each. Changed by: Lucio Sabin Tkeya Stencil  furosemide  20 MG tablet Commonly known as: LASIX  Take 1 tablet (20 mg total) by mouth daily. Take an extra furosemide  daily for 4 days What changed: You were already taking a medication with the same name, and this prescription was added. Make sure you understand how and when to take each. Changed by: Lucio Sabin Raquon Milledge   hydrochlorothiazide  25 MG tablet Commonly known as: HYDRODIURIL  Take 1 tablet (25 mg total) by mouth daily.   omeprazole  20 MG capsule Commonly known as: PRILOSEC Take 1 capsule (20 mg total) by mouth  daily.   rosuvastatin  5 MG tablet Commonly known as: Crestor  Take 1 tablet (5 mg total) by mouth at bedtime.   SUPER B COMPLEX PO Take 1 tablet by mouth daily.   tadalafil  5 MG tablet Commonly known as: CIALIS  Take 1 tablet by mouth once daily         Objective:   BP 123/80   Pulse 64   Ht 5\' 6"  (1.676 m)   Wt 255 lb (115.7 kg)   SpO2 95%   BMI 41.16 kg/m   Wt Readings from Last 3 Encounters:  12/25/23 255 lb (115.7 kg)  10/01/23 260 lb (117.9 kg)  09/22/23 260 lb (117.9 kg)    Physical Exam Vitals and nursing note reviewed.  Constitutional:      General: He is not in acute distress.    Appearance: He is well-developed. He is not diaphoretic.  Eyes:     General: No scleral icterus.    Conjunctiva/sclera: Conjunctivae normal.  Neck:     Thyroid : No thyromegaly.  Cardiovascular:     Rate and Rhythm: Normal rate and regular rhythm.     Heart sounds: Normal heart sounds. No murmur heard. Pulmonary:     Effort: Pulmonary effort is normal. No respiratory distress.     Breath sounds: Normal breath sounds. No wheezing.  Musculoskeletal:        General: Swelling (1+ edema in all 4 extremities) present. Normal range of motion.     Cervical back: Neck supple.  Lymphadenopathy:     Cervical: No cervical adenopathy.  Skin:    General: Skin is warm and dry.     Findings: No rash.  Neurological:     Mental Status: He is alert and oriented to person, place, and time.     Coordination: Coordination normal.  Psychiatric:        Behavior: Behavior normal.       Assessment & Plan:   Problem List Items Addressed This Visit       Cardiovascular and Mediastinum   Hypertension, essential   Relevant Medications   furosemide  (LASIX ) 20 MG tablet   hydrochlorothiazide  (HYDRODIURIL ) 25 MG tablet     Digestive   GERD (gastroesophageal reflux disease)   Relevant Medications   omeprazole  (PRILOSEC) 20 MG capsule     Endocrine   Type 2 diabetes mellitus with other  specified complication (HCC) - Primary   Relevant Orders   Bayer DCA Hb A1c Waived   Microalbumin/Creatinine Ratio, Urine   Type 2 diabetes mellitus with diabetic polyneuropathy, without long-term current use of insulin (HCC)     Other   Hyperlipidemia   Relevant Medications   furosemide  (LASIX ) 20 MG tablet   hydrochlorothiazide  (HYDRODIURIL ) 25 MG tablet   Lower extremity edema   Relevant Medications   furosemide  (LASIX ) 20 MG tablet    Increase furosemide  to twice a day for the next 4 days. Blood pressure and everything else looks good  today. Warned patient to watch his feet closely now that he has some numbness in the toes.  A1c looks good at 6.5. Follow up pl diabetes an: Return in about 3 months (around 03/26/2024), or if symptoms worsen or fail to improve, for Diabetes.  Counseling provided for all of the vaccine components Orders Placed This Encounter  Procedures   Bayer DCA Hb A1c Waived   Microalbumin/Creatinine Ratio, Urine    Jolyne Needs, MD [facility] 12/25/2023, 8:49 AM

## 2023-12-26 LAB — MICROALBUMIN / CREATININE URINE RATIO
Creatinine, Urine: 110.8 mg/dL
Microalb/Creat Ratio: <3 mg/g{creat} (ref 0–29)
Microalbumin, Urine: <3 ug/mL

## 2023-12-29 ENCOUNTER — Ambulatory Visit: Payer: Self-pay | Admitting: Family Medicine

## 2024-03-31 ENCOUNTER — Encounter: Payer: Self-pay | Admitting: Family Medicine

## 2024-03-31 ENCOUNTER — Ambulatory Visit (INDEPENDENT_AMBULATORY_CARE_PROVIDER_SITE_OTHER): Admitting: Family Medicine

## 2024-03-31 VITALS — BP 136/82 | HR 60 | Temp 97.6°F | Ht 66.0 in | Wt 248.0 lb

## 2024-03-31 DIAGNOSIS — N400 Enlarged prostate without lower urinary tract symptoms: Secondary | ICD-10-CM

## 2024-03-31 DIAGNOSIS — I152 Hypertension secondary to endocrine disorders: Secondary | ICD-10-CM | POA: Diagnosis not present

## 2024-03-31 DIAGNOSIS — R972 Elevated prostate specific antigen [PSA]: Secondary | ICD-10-CM

## 2024-03-31 DIAGNOSIS — E782 Mixed hyperlipidemia: Secondary | ICD-10-CM | POA: Diagnosis not present

## 2024-03-31 DIAGNOSIS — E1169 Type 2 diabetes mellitus with other specified complication: Secondary | ICD-10-CM

## 2024-03-31 DIAGNOSIS — E1159 Type 2 diabetes mellitus with other circulatory complications: Secondary | ICD-10-CM | POA: Diagnosis not present

## 2024-03-31 DIAGNOSIS — I10 Essential (primary) hypertension: Secondary | ICD-10-CM

## 2024-03-31 LAB — BAYER DCA HB A1C WAIVED: HB A1C (BAYER DCA - WAIVED): 6.3 % — ABNORMAL HIGH (ref 4.8–5.6)

## 2024-03-31 LAB — LIPID PANEL

## 2024-03-31 NOTE — Progress Notes (Signed)
 BP 136/82   Pulse 60   Temp 97.6 F (36.4 C)   Ht 5' 6 (1.676 m)   Wt 248 lb (112.5 kg)   SpO2 95%   BMI 40.03 kg/m    Subjective:   Patient ID: [name] [name], male    DOB: 09-Aug-1945, 78 y.o.   MRN: [id number]  HPI: [name] [name] is a 78 y.o. male presenting on 03/31/2024 for Medical Management of Chronic Issues, Diabetes, Hyperlipidemia, and Hypertension   Discussed the use of AI scribe software for clinical note transcription with the patient, who gave verbal consent to proceed.  History of Present Illness   [name] [name] is a 78 year old male with diabetes, hypertension, hyperlipidemia, BPH, and morbid obesity who presents for a recheck of these conditions.  He has not been checking his blood sugar levels regularly but manages his diabetes through diet, reducing intake of sweets like pies, cakes, and honey buns, though he occasionally indulges. His last A1c was 6.5.  His blood pressure is managed with hydrochlorothiazide  and Lasix . He has not visited a cardiologist recently but was previously cleared.  For hyperlipidemia, he takes rosuvastatin  5 mg without issues. He also takes Eliquis  and denies any bleeding or blood in his stool.  He has a history of BPH and morbid obesity. Two weeks ago, he felt weak upon getting out of bed, attributing it to not eating the previous day. He tries to eat daily, even if he has to make himself eat. He is increasing physical activity, walking about a quarter mile at a time and using a pedal exerciser occasionally. He visits the flea market on weekends, involving some walking, though he cannot walk as much as he used to.  His heart rate was low at 40 one morning but is usually in the 60s to 70s.          Relevant past medical, surgical, family and social history reviewed and updated as indicated. Interim medical history since our last visit reviewed. Allergies and medications reviewed and updated.  Review of Systems  Constitutional:   Negative for chills and fever.  Eyes:  Negative for visual disturbance.  Respiratory:  Negative for shortness of breath and wheezing.   Cardiovascular:  Positive for leg swelling. Negative for chest pain.  Musculoskeletal:  Negative for back pain and gait problem.  Skin:  Negative for rash.  All other systems reviewed and are negative.   Per HPI unless specifically indicated above   Allergies as of 03/31/2024       Reactions   Cialis  [tadalafil ] Other (See Comments)   Not allergic 02/23/21   Viagra [sildenafil Citrate] Other (See Comments)   heartburn        Medication List        Accurate as of March 31, 2024  8:49 AM. If you have any questions, ask your nurse or doctor.          apixaban  5 MG Tabs tablet Commonly known as: Eliquis  Take 1 tablet (5 mg total) by mouth 2 (two) times daily.   ascorbic acid  500 MG tablet Commonly known as: VITAMIN C Take 500 mg by mouth daily.   furosemide  20 MG tablet Commonly known as: LASIX  TAKE 1 TABLET EVERY DAY   furosemide  20 MG tablet Commonly known as: LASIX  Take 1 tablet (20 mg total) by mouth daily. Take an extra furosemide  daily for 4 days   hydrochlorothiazide  25 MG tablet Commonly known as: HYDRODIURIL  Take 1 tablet (  25 mg total) by mouth daily.   omeprazole  20 MG capsule Commonly known as: PRILOSEC Take 1 capsule (20 mg total) by mouth daily.   rosuvastatin  5 MG tablet Commonly known as: Crestor  Take 1 tablet (5 mg total) by mouth at bedtime.   SUPER B COMPLEX PO Take 1 tablet by mouth daily.   tadalafil  5 MG tablet Commonly known as: CIALIS  Take 1 tablet by mouth once daily         Objective:   BP 136/82   Pulse 60   Temp 97.6 F (36.4 C)   Ht 5' 6 (1.676 m)   Wt 248 lb (112.5 kg)   SpO2 95%   BMI 40.03 kg/m   Wt Readings from Last 3 Encounters:  03/31/24 248 lb (112.5 kg)  12/25/23 255 lb (115.7 kg)  10/01/23 260 lb (117.9 kg)    Physical Exam Vitals and nursing note reviewed.     Physical Exam   VITALS: BP- 136/82 CHEST: Lungs clear to auscultation bilaterally. CARDIOVASCULAR: Heart regular rate and rhythm, no murmurs. EXTREMITIES: Mild edema in legs.         Assessment & Plan:   Problem List Items Addressed This Visit       Cardiovascular and Mediastinum   Hypertension associated with diabetes (HCC)     Endocrine   Type 2 diabetes mellitus with other specified complication (HCC) - Primary   Relevant Orders   Bayer DCA Hb A1c Waived   CBC with Differential/Platelet   CMP14+EGFR   Lipid panel     Genitourinary   BPH (benign prostatic hyperplasia)     Other   Hyperlipidemia   Relevant Orders   Bayer DCA Hb A1c Waived   CBC with Differential/Platelet   CMP14+EGFR   Lipid panel   Morbid (severe) obesity due to excess calories (HCC)   Other Visit Diagnoses       Elevated PSA       Relevant Orders   PSA, total and free          Type 2 diabetes mellitus without complications Type 2 diabetes is well-managed with diet control. Hemoglobin A1c improved to 6.3%. - Continue dietary management for diabetes.  Essential hypertension Hypertension is well-controlled with hydrochlorothiazide  and Lasix . - Continue hydrochlorothiazide  and Lasix  for blood pressure management.  Mixed hyperlipidemia Mixed hyperlipidemia managed with rosuvastatin  5 mg, well-tolerated. - Continue rosuvastatin  (Crestor ) 5 mg for cholesterol management.  Morbid obesity due to excess calories Morbid obesity addressed with dietary changes and increased physical activity. Reduced sweets intake, walking a quarter mile. - Encourage continued dietary modifications to reduce calorie intake. - Encourage gradual increase in physical activity, starting with walking a quarter mile and increasing as tolerated.  Benign prostatic hyperplasia without lower urinary tract symptoms   He gets some frequency but is on a diuretic but that he does pretty well.      Follow up  plan: Return in about 3 months (around 06/30/2024), or if symptoms worsen or fail to improve, for Diabetes recheck.  Counseling provided for all of the vaccine components Orders Placed This Encounter  Procedures   Bayer DCA Hb A1c Waived   CBC with Differential/Platelet   CMP14+EGFR   Lipid panel   PSA, total and free    Fonda Levins, MD [facility] 03/31/2024, 8:49 AM

## 2024-04-01 LAB — LIPID PANEL
Cholesterol, Total: 130 mg/dL (ref 100–199)
HDL: 45 mg/dL (ref >39)
LDL CALC COMMENT:: 2.9 ratio (ref 0.0–5.0)
LDL Chol Calc (NIH): 58 mg/dL (ref 0–99)
Triglycerides: 160 mg/dL — AB (ref 0–149)
VLDL Cholesterol Cal: 27 mg/dL (ref 5–40)

## 2024-04-01 LAB — CBC WITH DIFFERENTIAL/PLATELET
Basophils Absolute: 0.1 x10E3/uL (ref 0.0–0.2)
Basos: 1 %
EOS (ABSOLUTE): 0.2 x10E3/uL (ref 0.0–0.4)
Eos: 2 %
Hematocrit: 41.6 % (ref 37.5–51.0)
Hemoglobin: 13.8 g/dL (ref 13.0–17.7)
Immature Grans (Abs): 0 x10E3/uL (ref 0.0–0.1)
Immature Granulocytes: 0 %
Lymphocytes Absolute: 2.7 x10E3/uL (ref 0.7–3.1)
Lymphs: 32 %
MCH: 32.1 pg (ref 26.6–33.0)
MCHC: 33.2 g/dL (ref 31.5–35.7)
MCV: 97 fL (ref 79–97)
Monocytes Absolute: 0.4 x10E3/uL (ref 0.1–0.9)
Monocytes: 5 %
Neutrophils Absolute: 5 x10E3/uL (ref 1.4–7.0)
Neutrophils: 60 %
Platelets: 161 x10E3/uL (ref 150–450)
RBC: 4.3 x10E6/uL (ref 4.14–5.80)
RDW: 13.5 % (ref 11.6–15.4)
WBC: 8.3 x10E3/uL (ref 3.4–10.8)

## 2024-04-01 LAB — CMP14+EGFR
ALT: 15 IU/L (ref 0–44)
AST: 26 IU/L (ref 0–40)
Albumin: 3.9 g/dL (ref 3.8–4.8)
Alkaline Phosphatase: 59 IU/L (ref 44–121)
BUN/Creatinine Ratio: 13 (ref 10–24)
BUN: 15 mg/dL (ref 8–27)
Bilirubin Total: 0.6 mg/dL (ref 0.0–1.2)
CO2: 29 mmol/L (ref 20–29)
Calcium: 9.5 mg/dL (ref 8.6–10.2)
Chloride: 98 mmol/L (ref 96–106)
Creatinine, Ser: 1.16 mg/dL (ref 0.76–1.27)
Globulin, Total: 2.5 g/dL (ref 1.5–4.5)
Glucose: 133 mg/dL — ABNORMAL HIGH (ref 70–99)
Potassium: 3.9 mmol/L (ref 3.5–5.2)
Sodium: 142 mmol/L (ref 134–144)
Total Protein: 6.4 g/dL (ref 6.0–8.5)
eGFR: 64 mL/min/1.73 (ref >59)

## 2024-04-01 LAB — PSA, TOTAL AND FREE
PSA, Free Pct: 13.3
PSA, Free: 0.6 ng/mL
Prostate Specific Ag, Serum: 4.5 ng/mL — AB (ref 0.0–4.0)

## 2024-04-08 ENCOUNTER — Ambulatory Visit: Payer: Self-pay | Admitting: Family Medicine

## 2024-04-08 NOTE — Progress Notes (Signed)
 Reviewed results with patient and patient voiced understanding.

## 2024-07-05 ENCOUNTER — Ambulatory Visit: Payer: Self-pay | Admitting: Family Medicine

## 2024-07-05 ENCOUNTER — Encounter: Payer: Self-pay | Admitting: Family Medicine

## 2024-07-05 VITALS — BP 129/99 | HR 77 | Ht 66.0 in | Wt 245.0 lb

## 2024-07-05 DIAGNOSIS — R600 Localized edema: Secondary | ICD-10-CM

## 2024-07-05 DIAGNOSIS — E1169 Type 2 diabetes mellitus with other specified complication: Secondary | ICD-10-CM

## 2024-07-05 DIAGNOSIS — I152 Hypertension secondary to endocrine disorders: Secondary | ICD-10-CM

## 2024-07-05 LAB — BAYER DCA HB A1C WAIVED: HB A1C (BAYER DCA - WAIVED): 6.4 % — ABNORMAL HIGH (ref 4.8–5.6)

## 2024-07-05 MED ORDER — FUROSEMIDE 20 MG PO TABS: 20.0000 mg | ORAL_TABLET | Freq: Every day | ORAL | 1 refills | Status: AC

## 2024-07-05 NOTE — Progress Notes (Signed)
 BP (!) 129/99   Pulse 77   Ht 5' 6 (1.676 m)   Wt 245 lb (111.1 kg)   SpO2 99%   BMI 39.54 kg/m    Subjective:   Patient ID: [name] [name], male    DOB: [birth year], 78 y.o.   MRN: [id number]  HPI: [name] [name] is a 78 y.o. male presenting on 07/05/2024 for Medical Management of Chronic Issues, Diabetes, Hypertension, and Hyperlipidemia   Discussed the use of AI scribe software for clinical note transcription with the patient, who gave verbal consent to proceed.  History of Present Illness   [name] [name] is a 78 year old male who presents for a recheck of his diabetes.  Glycemic control - Diabetes is currently diet-controlled. - No known issues with blood sugar levels. - Recent hemoglobin A1c performed using fingerstick blood sample.  Anticoagulation therapy - Continues Eliquis  for anticoagulation. - No adverse effects such as gastrointestinal bleeding or blood in stool.  Lower extremity edema - On diuretics for leg swelling. - Avoids diuretics when going out. - Improvement in swelling compared to previous visits. - Manages swelling by elevating legs for about an hour daily while watching TV.  Musculoskeletal pain and swelling - Pain and swelling in feet associated with back issues. - Improvement in foot symptoms correlates with improvement in back condition. - Receiving chiropractic care for back pain. - Uncertain about how to perform recommended stretching exercises.  Cardiopulmonary symptoms - No breathing difficulties. - No heart problems. - No chest pain.          Relevant past medical, surgical, family and social history reviewed and updated as indicated. Interim medical history since our last visit reviewed. Allergies and medications reviewed and updated.  Review of Systems  Constitutional:  Negative for chills and fever.  Eyes:  Negative for visual disturbance.  Respiratory:  Negative for shortness of breath and wheezing.   Cardiovascular:   Negative for chest pain and leg swelling.  Musculoskeletal:  Negative for back pain and gait problem.  Skin:  Negative for rash.  Neurological:  Negative for dizziness and light-headedness.  All other systems reviewed and are negative.   Per HPI unless specifically indicated above   Allergies as of 07/05/2024       Reactions   Cialis  [tadalafil ] Other (See Comments)   Not allergic 02/23/21   Viagra [sildenafil Citrate] Other (See Comments)   heartburn        Medication List        Accurate as of July 05, 2024 10:50 AM. If you have any questions, ask your nurse or doctor.          apixaban  5 MG Tabs tablet Commonly known as: Eliquis  Take 1 tablet (5 mg total) by mouth 2 (two) times daily.   ascorbic acid  500 MG tablet Commonly known as: VITAMIN C Take 500 mg by mouth daily.   furosemide  20 MG tablet Commonly known as: LASIX  Take 1 tablet (20 mg total) by mouth daily. Take an extra furosemide  daily for 4 days   furosemide  20 MG tablet Commonly known as: LASIX  Take 1 tablet (20 mg total) by mouth daily.   hydrochlorothiazide  25 MG tablet Commonly known as: HYDRODIURIL  Take 1 tablet (25 mg total) by mouth daily.   omeprazole  20 MG capsule Commonly known as: PRILOSEC Take 1 capsule (20 mg total) by mouth daily.   rosuvastatin  5 MG tablet Commonly known as: Crestor  Take 1 tablet (5 mg total) by mouth  at bedtime.   SUPER B COMPLEX PO Take 1 tablet by mouth daily.   tadalafil  5 MG tablet Commonly known as: CIALIS  Take 1 tablet by mouth once daily         Objective:   BP (!) 129/99   Pulse 77   Ht 5' 6 (1.676 m)   Wt 245 lb (111.1 kg)   SpO2 99%   BMI 39.54 kg/m   Wt Readings from Last 3 Encounters:  07/05/24 245 lb (111.1 kg)  03/31/24 248 lb (112.5 kg)  12/25/23 255 lb (115.7 kg)    Physical Exam Physical Exam   VITALS: BP- 131/82 NECK: Thyroid  normal, no lumps. CHEST: Lungs clear to auscultation bilaterally. CARDIOVASCULAR: Heart  regular rate and rhythm, no murmurs. EXTREMITIES: Mild edema in legs.         Assessment & Plan:   Problem List Items Addressed This Visit       Cardiovascular and Mediastinum   Hypertension associated with diabetes (HCC)   Relevant Medications   furosemide  (LASIX ) 20 MG tablet     Endocrine   Hyperlipidemia associated with type 2 diabetes mellitus (HCC)   Relevant Medications   furosemide  (LASIX ) 20 MG tablet   Type 2 diabetes mellitus with other specified complication (HCC) - Primary   Relevant Orders   Bayer DCA Hb A1c Waived   Other Visit Diagnoses       Peripheral edema       Relevant Medications   furosemide  (LASIX ) 20 MG tablet          Type 2 diabetes mellitus, diet controlled Well-controlled with diet. Hemoglobin A1c is 6.4, indicating good glycemic control. - Continue current dietary management for diabetes.  Localized edema of lower extremities Improved with current management. Mild swelling persists. No signs of heart failure or systemic issues. - Continue current diuretic regimen as needed. - Encouraged leg elevation for at least one hour daily.  Chronic low back pain Possible nerve involvement. Regular chiropractic care ongoing. - Continue regular chiropractic care. - Encouraged learning and performing stretching exercises for back and muscles.          Follow up plan: Return in about 3 months (around 10/03/2024), or if symptoms worsen or fail to improve, for Diabetes .  Counseling provided for all of the vaccine components Orders Placed This Encounter  Procedures   Bayer DCA Hb A1c Waived    Fonda Levins, MD [facility] 07/05/2024, 10:50 AM

## 2024-10-01 ENCOUNTER — Ambulatory Visit: Payer: Self-pay

## 2024-10-05 ENCOUNTER — Ambulatory Visit

## 2024-10-13 ENCOUNTER — Ambulatory Visit: Admitting: Family Medicine
